# Patient Record
Sex: Female | Born: 1935 | Race: White | Hispanic: No | Smoking: Never smoker
Health system: Southern US, Community
[De-identification: ages and names within clinical notes are randomized; demographics above are authoritative.]

## PROBLEM LIST (undated history)

## (undated) DIAGNOSIS — E785 Hyperlipidemia, unspecified: Secondary | ICD-10-CM

## (undated) DIAGNOSIS — I4891 Unspecified atrial fibrillation: Secondary | ICD-10-CM

## (undated) DIAGNOSIS — I509 Heart failure, unspecified: Secondary | ICD-10-CM

## (undated) DIAGNOSIS — I87309 Chronic venous hypertension (idiopathic) without complications of unspecified lower extremity: Secondary | ICD-10-CM

## (undated) DIAGNOSIS — I313 Pericardial effusion (noninflammatory): Secondary | ICD-10-CM

## (undated) DIAGNOSIS — J9600 Acute respiratory failure, unspecified whether with hypoxia or hypercapnia: Secondary | ICD-10-CM

## (undated) DIAGNOSIS — I10 Essential (primary) hypertension: Secondary | ICD-10-CM

## (undated) DIAGNOSIS — E559 Vitamin D deficiency, unspecified: Secondary | ICD-10-CM

## (undated) DIAGNOSIS — K220 Achalasia of cardia: Secondary | ICD-10-CM

## (undated) DIAGNOSIS — M1990 Unspecified osteoarthritis, unspecified site: Secondary | ICD-10-CM

## (undated) DIAGNOSIS — K621 Rectal polyp: Secondary | ICD-10-CM

## (undated) DIAGNOSIS — E039 Hypothyroidism, unspecified: Secondary | ICD-10-CM

## (undated) DIAGNOSIS — I440 Atrioventricular block, first degree: Secondary | ICD-10-CM

## (undated) DIAGNOSIS — R195 Other fecal abnormalities: Secondary | ICD-10-CM

## (undated) DIAGNOSIS — K317 Polyp of stomach and duodenum: Secondary | ICD-10-CM

## (undated) DIAGNOSIS — K449 Diaphragmatic hernia without obstruction or gangrene: Secondary | ICD-10-CM

## (undated) DIAGNOSIS — I3139 Other pericardial effusion (noninflammatory): Secondary | ICD-10-CM

## (undated) DIAGNOSIS — K219 Gastro-esophageal reflux disease without esophagitis: Secondary | ICD-10-CM

## (undated) HISTORY — DX: Gastro-esophageal reflux disease without esophagitis: K21.9

## (undated) HISTORY — DX: Other fecal abnormalities: R19.5

## (undated) HISTORY — PX: DILATION AND CURETTAGE OF UTERUS: SHX78

## (undated) HISTORY — DX: Atrioventricular block, first degree: I44.0

## (undated) HISTORY — DX: Polyp of stomach and duodenum: K31.7

## (undated) HISTORY — DX: Hyperlipidemia, unspecified: E78.5

## (undated) HISTORY — DX: Rectal polyp: K62.1

## (undated) HISTORY — DX: Diaphragmatic hernia without obstruction or gangrene: K44.9

## (undated) HISTORY — PX: KIDNEY STONE SURGERY: SHX686

## (undated) HISTORY — DX: Unspecified atrial fibrillation: I48.91

## (undated) HISTORY — DX: Hypothyroidism, unspecified: E03.9

## (undated) HISTORY — DX: Unspecified osteoarthritis, unspecified site: M19.90

---

## 1998-04-14 ENCOUNTER — Other Ambulatory Visit: Admission: RE | Admit: 1998-04-14 | Discharge: 1998-04-14 | Payer: Self-pay | Admitting: Family Medicine

## 1998-05-20 ENCOUNTER — Encounter: Payer: Self-pay | Admitting: Obstetrics and Gynecology

## 1998-05-24 ENCOUNTER — Ambulatory Visit (HOSPITAL_COMMUNITY): Admission: RE | Admit: 1998-05-24 | Discharge: 1998-05-24 | Payer: Self-pay | Admitting: Obstetrics and Gynecology

## 2001-05-02 ENCOUNTER — Other Ambulatory Visit: Admission: RE | Admit: 2001-05-02 | Discharge: 2001-05-02 | Payer: Self-pay | Admitting: Family Medicine

## 2002-02-19 ENCOUNTER — Encounter (INDEPENDENT_AMBULATORY_CARE_PROVIDER_SITE_OTHER): Payer: Self-pay | Admitting: *Deleted

## 2002-02-19 ENCOUNTER — Ambulatory Visit (HOSPITAL_COMMUNITY): Admission: RE | Admit: 2002-02-19 | Discharge: 2002-02-19 | Payer: Self-pay | Admitting: Gastroenterology

## 2003-05-10 ENCOUNTER — Other Ambulatory Visit: Admission: RE | Admit: 2003-05-10 | Discharge: 2003-05-10 | Payer: Self-pay | Admitting: Family Medicine

## 2006-03-18 ENCOUNTER — Other Ambulatory Visit: Admission: RE | Admit: 2006-03-18 | Discharge: 2006-03-18 | Payer: Self-pay | Admitting: Family Medicine

## 2007-01-15 ENCOUNTER — Ambulatory Visit: Payer: Self-pay | Admitting: Cardiology

## 2007-01-22 ENCOUNTER — Ambulatory Visit: Payer: Self-pay

## 2007-05-21 ENCOUNTER — Ambulatory Visit: Payer: Self-pay | Admitting: Cardiology

## 2008-02-04 ENCOUNTER — Ambulatory Visit: Payer: Self-pay | Admitting: Cardiology

## 2008-03-02 LAB — HM DEXA SCAN

## 2008-03-03 ENCOUNTER — Ambulatory Visit: Payer: Self-pay

## 2008-03-03 ENCOUNTER — Encounter: Payer: Self-pay | Admitting: Cardiology

## 2008-04-30 LAB — HM PAP SMEAR

## 2009-08-23 LAB — HM MAMMOGRAPHY

## 2010-01-23 LAB — FECAL OCCULT BLOOD, GUAIAC: Fecal Occult Blood: NEGATIVE

## 2010-07-04 ENCOUNTER — Encounter: Payer: Self-pay | Admitting: Family Medicine

## 2010-07-04 DIAGNOSIS — K219 Gastro-esophageal reflux disease without esophagitis: Secondary | ICD-10-CM | POA: Insufficient documentation

## 2010-07-04 DIAGNOSIS — K317 Polyp of stomach and duodenum: Secondary | ICD-10-CM

## 2010-07-04 DIAGNOSIS — E785 Hyperlipidemia, unspecified: Secondary | ICD-10-CM

## 2010-07-04 DIAGNOSIS — K449 Diaphragmatic hernia without obstruction or gangrene: Secondary | ICD-10-CM

## 2010-07-04 DIAGNOSIS — I4891 Unspecified atrial fibrillation: Secondary | ICD-10-CM

## 2010-07-04 DIAGNOSIS — K621 Rectal polyp: Secondary | ICD-10-CM

## 2010-07-04 DIAGNOSIS — E039 Hypothyroidism, unspecified: Secondary | ICD-10-CM

## 2010-07-04 DIAGNOSIS — I440 Atrioventricular block, first degree: Secondary | ICD-10-CM

## 2010-07-04 DIAGNOSIS — M1990 Unspecified osteoarthritis, unspecified site: Secondary | ICD-10-CM

## 2010-07-04 DIAGNOSIS — N951 Menopausal and female climacteric states: Secondary | ICD-10-CM

## 2010-07-04 DIAGNOSIS — N390 Urinary tract infection, site not specified: Secondary | ICD-10-CM

## 2010-08-15 NOTE — Assessment & Plan Note (Signed)
[facility]                            CARDIOLOGY OFFICE NOTE   NAME:[name] [name] ORAVEC                       MRN:          [id number]  DATE:01/15/2007                            DOB:          07-Apr-1932    PRIMARY:  Dr. Vernon Prey.   REASON FOR PRESENTATION:  Evaluate the patient with new-onset atrial  fibrillation.   HISTORY OF PRESENT ILLNESS:  The patient was found recently to be in  atrial fibrillation, which is a new diagnosis.  We have previously seen  her for evaluation of an abnormal chest x-ray.  She had an  echocardiogram in 1997 with no significant abnormalities.   She was noted recently to be in atrial fibrillation, but seemed to have  reasonable rates.  She notices palpitations rarely.  None of this is  limiting.  She has a good exercise tolerance.  It is not changed.  She  does not notice any pre-syncope or syncope.  She has no shortness of  breath, chest discomfort, neck or arm discomfort.  She has had her  thyroid checked recently, and these are normal.  Other electrolytes were  normal as well.  She has otherwise been feeling well.   PAST MEDICAL HISTORY:  Hypothyroidism x15 years.  Hypertension x15  years.  Nephrolithiasis.   PAST SURGICAL HISTORY:  D&C x3.   ALLERGIES:  None.   MEDICATIONS:  1. Klor-Con 10 mEq daily.  2. Levoxyl 137 mcg daily.  3. Lisinopril 10 mg daily.  4. Hydrochlorothiazide 25 mg daily.   SOCIAL HISTORY:  The patient is a Mudlogger at [employer].  She is  single.  She has 2 children.  She does not smoke cigarettes and never  did.  She does not drink alcohol.   FAMILY HISTORY:  Contributory for early coronary artery disease in a  sister with coronary artery disease at 20.  Negative for other systems.  Her mother died of a cerebral hemorrhage at age 91.   REVIEW OF SYSTEMS:  Positive for glasses, reflux, cramps, varicose  veins.  Negative for other systems.   PHYSICAL EXAMINATION:  The patient is  in no distress.  Blood pressure 112/68, heart rate 86 and irregular, weight 208 pounds,  body mass index 34.  HEENT:  Eyelids unremarkable.  Pupils are equal, round, and reactive to  light and accommodation.  Fundi are not visualized.  Oral mucosa  unremarkable.  NECK:  No jugular venous distension at 45 degrees, carotid upstroke  brisk and symmetric, no bruits, thyromegaly.  LYMPHATICS:  No cervical, axillary, or inguinal adenopathy.  LUNGS:  Clear to auscultation bilaterally.  BACK:  No costovertebral angle tenderness.  CHEST:  Unremarkable.  HEART:  PMI not displaced or sustained, S1 and S2 within normal limits,  no S3, no S4, no clicks, rubs, murmurs.  ABDOMEN:  Flat, positive bowel sounds, normal in frequency and pitch, no  bruits, rebound, guarding.  No midline pulsatile masses, hepatomegaly,  splenomegaly.  SKIN:  No rashes, no nodules.  EXTREMITIES:  With 2+ pulses throughout, no edema, cyanosis, clubbing.  NEURO:  Oriented to person, place,  and time, cranial nerves 2-12 grossly  intact, motor grossly intact.   EKG:  Atrial fibrillation, left axis deviation, left anterior fascicular  block, poor anterior R wave progression.   ASSESSMENT AND PLAN:  1. Atrial fibrillation.  The patient is having atrial fibrillation,      but does not particularly notice this.  At this point, I am      inclined to lean towards rate control and anticoagulation.  She has      no contraindication as far as I can tell for the anticoagulation.      She has had recent blood work that indicates very mild anemia.  She      was being treated with a low dose of iron.  I will also put a 24      hour Holter monitor on to see that she has adequate rate control.      Finally, she will get an echocardiogram per guideline      recommendations.  2. Hypertension.  Blood pressure is well controlled.  She will      continue hydrochlorothiazide and lisinopril as listed.  3. Hypothyroidism.  This was checked and  she will continue with      thyroid replacement therapy.  4. Followup.  I would like to see her back in about a month to review      the above.  We will give her literature on Coumadin and on atrial      fibrillation.     Rollene Rotunda, MD, [facility]  Electronically Signed    JH/MedQ  DD: 01/15/2007  DT: 01/16/2007  Job #: [id number]   cc:   Ernestina Penna, M.D.

## 2010-08-15 NOTE — Assessment & Plan Note (Signed)
[facility]                            CARDIOLOGY OFFICE NOTE   NAME:[name], [name]                       MRN:          [id number]  DATE:02/04/2008                            DOB:          April 28, 1932    PRIMARY CARE PHYSICIAN:  Ernestina Penna, MD   REASON FOR PRESENTATION:  Evaluate the patient with mitral regurgitation  and atrial fibrillation.   HISTORY OF PRESENT ILLNESS:  The patient is now 77 years old.  She  returns for 85-month followup.  Since I last saw her, she has had no new  problems.  She took herself off Coumadin because of some tingling in her  feet.  We have had long conversations about restarting this, but she  does not want to.  She rarely feels any palpitations.  She has not had  any presyncope or syncope.  She still works at [employer], making  biscuits.  Of note, she does have moderate mitral regurgitation.  This  was last followed with an echocardiogram in October 2008.  She has had  no new shortness of breath.  She denies any PND or orthopnea.  She had  no chest discomfort, neck or arm discomfort.   PAST MEDICAL HISTORY:  Atrial fibrillation; mitral regurgitation,  moderate with 2 separate jets, and left atrial dilatation;  hypothyroidism x15 years; hypertension x15 years; D&C x3.   ALLERGIES:  None.   MEDICATIONS:  1. Klor-Con 10 mg daily.  2. Levoxyl 137 mcg daily.  3. Lisinopril 10 mg daily.  4. Hydrochlorothiazide 25 mg daily.  5. Aspirin 81 mg daily.  6. Vitamin D.   REVIEW OF SYSTEMS:  As stated in the HPI and otherwise negative for all  other systems.   PHYSICAL EXAMINATION:  GENERAL:  The patient is pleasant and in no  distress.  VITAL SIGNS:  Blood pressure 110/66, heart rate 81 and irregular, weight  205 pounds, and body mass index 35.  HEENT:  Eyelids unremarkable; pupils equal, round, and reactive to  light; fundi not visualized; oral mucosa unremarkable.  NECK:  No jugular distention at 45 degrees;  carotid upstroke brisk and  symmetric; no bruits, no thyromegaly.  LYMPHATICS:  No cervical, axillary, or inguinal adenopathy.  LUNGS:  Clear to auscultation bilaterally.  BACK:  No costovertebral angle tenderness.  CHEST:  Unremarkable.  HEART:  PMI not displaced or sustained; S1 and S2 within normal limits;  no S3, no murmurs.  ABDOMEN:  Flat; positive bowel sounds, normal in frequency and pitch; no  bruits, no rebound, no guarding; no midline pulsatile mass; no  hepatomegaly, no splenomegaly.  SKIN:  No rashes, no nodules.  EXTREMITIES:  A 2+ pulses throughout, mild bilateral ankle edema, mild  varicose veins; no cyanosis, no clubbing.  NEURO:  Oriented to person, place, and time; cranial nerves II through  XII grossly intact; motor grossly intact.   EKG atrial fibrillation, poor anterior R-wave progression suggestive of  possible anteroseptal infarct, left axis deviation, left anterior-  fascicular block, no acute ST-T wave changes, no change from previous  EKGs.   ASSESSMENT AND PLAN:  1. Atrial fibrillation.  I think, the patient should be on Coumadin.      We have discussed the risk of stroke and this is indicated.      However, she really states that she does not want to take it more      because of the cost of the copays when she gets it checked and the      inconvenience.  I have told her to please be reasonable to weigh      this against the risk of stroke, but she does not want to restart      Coumadin at this point.  2. Mitral regurgitation.  We will order an echocardiogram to follow up      on this.  3. Hypertension.  Blood pressure is well controlled, and she will      continue medications as listed.  4. Hypothyroidism, per Dr. Christell Constant.  5. Followup.  I will see the patient again in 6 months or sooner if      needed.     Rollene Rotunda, MD, [facility]  Electronically Signed    JH/MedQ  DD: 02/04/2008  DT: 02/05/2008  Job #: [id number]   cc:   Ernestina Penna, M.D.

## 2010-08-15 NOTE — Assessment & Plan Note (Signed)
[facility]                            CARDIOLOGY OFFICE NOTE   NAME:[name], [name]                       MRN:          [id number]  DATE:05/21/2007                            DOB:          [birth year]    PRIMARY CARE PHYSICIAN:  Ernestina Penna, M.D.   REASON FOR PRESENTATION:  Evaluate patient with mitral regurgitation  atrial fib.   HISTORY OF PRESENT ILLNESS:  the patient is 77 years old.  She returns  for followup of the above.  Since I last saw her.  She stopped her  Coumadin on her own because of tingling from her feet to the top of her  head.  She took the drug for about 4-5 days.  She notices palpitations  occasionally.  These are not particularly problematic.  She has had only  two episodes of some mild lightheadedness, but no pre-syncope or  syncope.  She is able to do her activities of daily living and still  works at [employer]. She denies any chest discomfort, neck or arm  discomfort.  She had no shortness of breath.  Denies any PND or  orthopnea.   Of note, she did have an echocardiogram following the last visit.  Her  ejection fraction was 60%.  However, there was moderate mitral  regurgitation noted.  The left atrium was dilated.  The patient wore a  Holter monitor which demonstrated persistent atrial fibrillation.  Her  rates were reasonably controlled.  There were no sustained tachy rates  of pauses.   PAST MEDICAL HISTORY:  1. Atrial fibrillation.  2. Mitral regurgitation recently noted.  3. Hypothyroidism x15 years.  4. Hypertension x15 years.  5. D&C x3.   ALLERGIES:  None.   MEDICATIONS:  1. Potassium 10 mEq daily.  2. Levoxyl 137 mcg daily.  3. Lisinopril 10 mg daily.  4. Hydrochlorothiazide 25 mg daily.  5. Aspirin 81 mg daily.   REVIEW OF SYSTEMS:  As stated in the HPI and otherwise negative for  other systems.   PHYSICAL EXAMINATION:  The patient is in no distress.  Blood pressure  124/86, heart rate 64  regular.  HEENT: Eyelids unremarkable. Pupils equal, round, and reactive to light.  Fundi not visualized. Oral mucosa is unremarkable.  NECK: No jugular venous distention at 45 degrees. Carotid upstroke brisk  and symmetrical. No bruits, no thyromegaly.  LYMPHATICS: No cervical, axillary or inguinal adenopathy.  LUNGS: Clear to auscultation bilaterally.  BACK: No costovertebral angle tenderness.  CHEST: Unremarkable.  HEART: PMI not displaced or sustained. S1, S2 within normal limits. No  S3. No murmurs.  ABDOMEN: Flat, positive bowel sounds, normal in frequency and pitch. No  bruits. No rebounds. No guarding. No midline pulsatile mass. No  organomegaly.  SKIN: No rashes, no nodules.  EXTREMITIES: 2+ pulses throughout. Mild nonpitting edema, varicose veins  in the lower extremities.  NEURO: Oriented to person, place and time. Cranial nerves II-XII grossly  intact. Motor grossly intact.   EKG:  Atrial fibrillation, left axis deviation, left anterior fascicular  block, poor anterior R wave progression, cannot exclude anteroseptal  infarct.   ASSESSMENT/PLAN:  1. Atrial fibrillation.  We had a long discussion about the Coumadin      and risks of stroke.  I think that her age, sex, mitral      regurgitation, left atrial dilatation put her into a risk category      of 3-4% per year which would favor the use of Coumadin.  I doubt      that the side effects she was describing are related to the      Coumadin.  After this discussion she will resume the Coumadin.  I      have told her she needs to call the [facility] in [place] today to let them know that she is resuming and to      schedule followup.  She will resume at her previous prescribed      dose.  2. Mitral regurgitation. Will follow this in October with a repeat      echocardiogram.  She does not have any symptoms related to this.  3. Hypertension, blood pressure is well-controlled.  She will continue       medications as listed.  4. Hypothyroidism per Dr. Christell Constant.  5. Followup.  I will see her back in October after the echo.     Rollene Rotunda, MD, [facility]  Electronically Signed    JH/MedQ  DD: 05/21/2007  DT: 05/21/2007  Job #: [id number]   cc:   Ernestina Penna, M.D.

## 2010-08-18 NOTE — Op Note (Signed)
   NAME:  [name], [name]                          ACCOUNT NO.:  [id number]   MEDICAL RECORD NO.:  [id number]                   PATIENT TYPE:  AMB   LOCATION:  ENDO                                 FACILITY:  [facility]   PHYSICIAN:  John C. Madilyn Fireman, M.D.                 DATE OF BIRTH:  November 18, 1932   DATE OF PROCEDURE:  02/19/2002  DATE OF DISCHARGE:                                 OPERATIVE REPORT   PROCEDURE:  Esophagogastroduodenoscopy with biopsy.   INDICATION FOR PROCEDURE:  Anemia and gastroesophageal reflux.  Today's  procedure is combined with a colonoscopy as well.   DESCRIPTION OF PROCEDURE:  The patient was placed in the left lateral  decubitus position and placed on the pulse monitor with continuous low-flow  oxygen delivered by nasal cannula.  She was sedated with 60 mg IV Demerol  and 6 mg IV Versed.  The Olympus video endoscope was advanced under direct  vision into the oropharynx and esophagus.  The esophagus was straight and of  normal caliber with the squamocolumnar line at 37 cm above a 2.5 cm hiatal  hernia.  There was no suggestion of Barrett's esophagus and no visible  erosions and no ring or stricture.  The stomach was entered and a small  amount of liquid secretions was suctioned from the fundus.  Retroflexed view  of the cardia was unremarkable.  On the greater curvature and the proximal  body there was an 8 mm polyp, which was fulgurated by hot biopsy.  The  remainder of the body and antrum appeared normal.  The pylorus was  nondeformed and easily allowed passage of the endoscope deep into the  duodenum.  Both the bulb and the second portion were well-inspected and  appeared to be within normal limits.  Small bowel biopsies were obtained due  to the anemia.  The scope was then withdrawn, and the patient returned to  the recovery room in stable condition.  She tolerated the procedure well,  and there were no immediate complications.   IMPRESSION:  1. Small gastric  polyp.  2. A 2.5 cm hiatal hernia.   PLAN:  Await biopsy results and will proceed with colonoscopy.                                               John C. Madilyn Fireman, M.D.    JCH/MEDQ  D:  02/19/2002  T:  02/19/2002  Job:  [id number]   cc:   Ernestina Penna, M.D.  [address] [place]  [place] [address]  Fax: [phone]

## 2010-08-18 NOTE — Op Note (Signed)
   NAME:  FELICIANA, NARAYAN                          ACCOUNT NO.:  [id number]   MEDICAL RECORD NO.:  [id number]                   PATIENT TYPE:  AMB   LOCATION:  ENDO                                 [facility]ACILITY:  [facility]   PHYSICIAN:  John C. Madilyn Fireman, M.D.                 DATE OF BIRTH:  Dec 25, 1932   DATE OF PROCEDURE:  02/19/2002  DATE OF DISCHARGE:                                 OPERATIVE REPORT   PROCEDURE:  Colonoscopy with polypectomy.   INDICATIONS FOR PROCEDURE:  Anemia in a 77 year old patient with no recent  colon screening.   DESCRIPTION OF PROCEDURE:  The patient was placed in the left lateral  decubitus position then placed on the pulse monitor with continuous low flow  oxygen delivered by nasal cannula. She was sedated with 60 mg IV Demerol and  6 mg IV Versed. The Olympus video colonoscope was inserted into the rectum  and advanced to the cecum, confirmed by transillumination at McBurney's  point and visualization of the ileocecal valve and appendiceal orifice. The  prep was excellent. The cecum, ascending, transverse, descending and sigmoid  colon all appeared normal with no masses, polyps, diverticula or other  mucosal abnormalities. Within the rectum, there was an 8 mm sessile polyp  which was fulgurated by hot biopsy. The remainder of the rectum appeared  normal. The scope was then withdrawn and the patient returned to the  recovery room in stable condition. The patient tolerated the procedure well  and there were no immediate complications.   IMPRESSION:  Small rectal polyp, otherwise, normal colonoscopy.   PLAN:  Await biopsy results.                                               John C. Madilyn Fireman, M.D.    JCH/MEDQ  D:  02/19/2002  T:  02/19/2002  Job:  [id number]   cc:   Ernestina Penna, M.D.  [address] [place]  [place] [address]  Fax: [phone]

## 2012-02-20 ENCOUNTER — Encounter: Payer: Self-pay | Admitting: Cardiology

## 2012-05-17 ENCOUNTER — Emergency Department (HOSPITAL_COMMUNITY)
Admission: EM | Admit: 2012-05-17 | Discharge: 2012-05-17 | Disposition: A | Discharge status: Home / Self Care | Payer: Medicare Other | Attending: Emergency Medicine | Admitting: Emergency Medicine

## 2012-05-17 ENCOUNTER — Emergency Department (HOSPITAL_COMMUNITY): Payer: Medicare Other

## 2012-05-17 ENCOUNTER — Encounter (HOSPITAL_COMMUNITY): Payer: Self-pay | Admitting: Emergency Medicine

## 2012-05-17 DIAGNOSIS — Z79899 Other long term (current) drug therapy: Secondary | ICD-10-CM | POA: Insufficient documentation

## 2012-05-17 DIAGNOSIS — Z8639 Personal history of other endocrine, nutritional and metabolic disease: Secondary | ICD-10-CM | POA: Insufficient documentation

## 2012-05-17 DIAGNOSIS — Z8739 Personal history of other diseases of the musculoskeletal system and connective tissue: Secondary | ICD-10-CM | POA: Insufficient documentation

## 2012-05-17 DIAGNOSIS — Z8742 Personal history of other diseases of the female genital tract: Secondary | ICD-10-CM | POA: Insufficient documentation

## 2012-05-17 DIAGNOSIS — Z8719 Personal history of other diseases of the digestive system: Secondary | ICD-10-CM | POA: Insufficient documentation

## 2012-05-17 DIAGNOSIS — E039 Hypothyroidism, unspecified: Secondary | ICD-10-CM | POA: Insufficient documentation

## 2012-05-17 DIAGNOSIS — J40 Bronchitis, not specified as acute or chronic: Secondary | ICD-10-CM

## 2012-05-17 DIAGNOSIS — J209 Acute bronchitis, unspecified: Secondary | ICD-10-CM | POA: Insufficient documentation

## 2012-05-17 DIAGNOSIS — Z8679 Personal history of other diseases of the circulatory system: Secondary | ICD-10-CM | POA: Insufficient documentation

## 2012-05-17 DIAGNOSIS — Z8781 Personal history of (healed) traumatic fracture: Secondary | ICD-10-CM | POA: Insufficient documentation

## 2012-05-17 DIAGNOSIS — Z87440 Personal history of urinary (tract) infections: Secondary | ICD-10-CM | POA: Insufficient documentation

## 2012-05-17 DIAGNOSIS — I10 Essential (primary) hypertension: Secondary | ICD-10-CM | POA: Insufficient documentation

## 2012-05-17 DIAGNOSIS — Z862 Personal history of diseases of the blood and blood-forming organs and certain disorders involving the immune mechanism: Secondary | ICD-10-CM | POA: Insufficient documentation

## 2012-05-17 HISTORY — DX: Essential (primary) hypertension: I10

## 2012-05-17 MED ORDER — BENZONATATE 100 MG PO CAPS: 100.0000 mg | ORAL_CAPSULE | Freq: Three times a day (TID) | ORAL | Status: DC

## 2012-05-17 MED ORDER — ALBUTEROL SULFATE HFA 108 (90 BASE) MCG/ACT IN AERS
2.0000 | INHALATION_SPRAY | Freq: Once | RESPIRATORY_TRACT | Status: AC
  Administered 2012-05-17: 2 via RESPIRATORY_TRACT
  Filled 2012-05-17: qty 6.7

## 2012-05-17 MED ORDER — AZITHROMYCIN 250 MG PO TABS: 250.0000 mg | ORAL_TABLET | Freq: Every day | ORAL | Status: DC

## 2012-05-17 NOTE — ED Provider Notes (Signed)
History     CSN: [id number]  Arrival date & time 05/17/12  1400   First MD Initiated Contact with Patient 05/17/12 1648      Chief Complaint  Patient presents with  . Cough    (Consider location/radiation/quality/duration/timing/severity/associated sxs/prior treatment) HPI Comments: 79 year old female with a recent history of a collar bone fracture as well as rib fractures one month ago who presents with a complaint of increased cough. The cough started 4 days ago, he gradual in onset, it has been persistent and now it is gradually worsening and has been associated with a yellow sputum production. She denies fevers or chills, nausea or vomiting, chest pain abdominal pain back pain or swelling in the legs. She has not tried any medications for this. She tells me that she was recently told she thought she may have emphysema by her family doctor but has no history of tobacco use. Ever since her fall with collarbone fracture she has been living with her child who is a smoker.  Patient is a 79 y.o. female presenting with cough. The history is provided by the patient and a relative.  Cough   Past Medical History  Diagnosis Date  . Unspecified hypothyroidism   . Urinary tract infection, site not specified   . Diaphragmatic hernia without mention of obstruction or gangrene   . Esophagitis   . Other and unspecified hyperlipidemia   . Prolapse of vaginal walls without mention of uterine prolapse   . Arthritis   . First degree atrioventricular block   . Symptomatic menopausal or female climacteric states   . AF (atrial fibrillation)   . Rectal polyp   . Gastric polyp   . Hypertension     Past Surgical History  Procedure Laterality Date  . Kidney stone surgery    . Dilation and curettage of uterus      No family history on file.  History  Substance Use Topics  . Smoking status: Never Smoker   . Smokeless tobacco: Not on file  . Alcohol Use: No    OB History   Grav Para Term  Preterm Abortions TAB SAB Ect Mult Living                  Review of Systems  Respiratory: Positive for cough.   All other systems reviewed and are negative.    Allergies  Aspirin; Codeine; Sulfa antibiotics; Vicodin; and Warfarin sodium  Home Medications   Current Outpatient Rx  Name  Route  Sig  Dispense  Refill  . chlorpheniramine-HYDROcodone (TUSSIONEX) 10-8 MG/5ML LQCR   Oral   Take 5 mLs by mouth every 12 (twelve) hours as needed (cough).         . Cholecalciferol (VITAMIN D3) 2000 UNITS TABS   Oral   Take 1 tablet by mouth daily.           . Fe Fum-FePoly-Vit C-Vit B3 (INTEGRA) 62.5-62.5-40-3 MG CAPS   Oral   Take 1 capsule by mouth daily.         . furosemide (LASIX) 40 MG tablet   Oral   Take 40 mg by mouth daily.         Marland Kitchen HYDROcodone-acetaminophen (NORCO/VICODIN) 5-325 MG per tablet   Oral   Take 1 tablet by mouth every 4 (four) hours as needed for pain.         Marland Kitchen levothyroxine (SYNTHROID, LEVOTHROID) 137 MCG tablet   Oral   Take 137 mcg by mouth daily. Mon,Wed,Fri.         Marland Kitchen  lisinopril (PRINIVIL,ZESTRIL) 10 MG tablet   Oral   Take 10 mg by mouth daily.           . Multiple Vitamins-Minerals (CENTRUM SILVER) tablet   Oral   Take 1 tablet by mouth daily.           . potassium chloride (K-DUR,KLOR-CON) 10 MEQ tablet   Oral   Take 10 mEq by mouth daily.           Marland Kitchen azithromycin (ZITHROMAX Z-PAK) 250 MG tablet   Oral   Take 1 tablet (250 mg total) by mouth daily. 500mg  PO day 1, then 250mg  PO days 205   6 tablet   0   . benzonatate (TESSALON) 100 MG capsule   Oral   Take 1 capsule (100 mg total) by mouth every 8 (eight) hours.   21 capsule   0     BP 136/70  Pulse 104  Temp(Src) 98.3 F (36.8 C) (Oral)  Resp 16  SpO2 96%  Physical Exam  Nursing note and vitals reviewed. Constitutional: She appears well-developed and well-nourished. No distress.  HENT:  Head: Normocephalic and atraumatic.  Mouth/Throat: Oropharynx  is clear and moist. No oropharyngeal exudate.  No signs of trauma to the head, normocephalic, atraumatic, oropharynx is clear with moist mucous membranes and a clear pharynx without asymmetry, exudate, hypertrophy or erythema. Nasal passages are clear with no rhinorrhea or turbinate swelling. Tympanic membranes are clear bilaterally  Eyes: Conjunctivae and EOM are normal. Pupils are equal, round, and reactive to light. Right eye exhibits no discharge. Left eye exhibits no discharge. No scleral icterus.  Neck: Normal range of motion. Neck supple. No JVD present. No thyromegaly present.  Cardiovascular: Normal rate, regular rhythm, normal heart sounds and intact distal pulses.  Exam reveals no gallop and no friction rub.   No murmur heard. Pulmonary/Chest: Effort normal. No respiratory distress. She has wheezes ( diffuse mild expiratory wheezing, no respiratory distress, no rales, speaks in full sentences). She has no rales.  Abdominal: Soft. Bowel sounds are normal. She exhibits no distension and no mass. There is no tenderness.  Musculoskeletal: Normal range of motion. She exhibits no edema and no tenderness.  Lymphadenopathy:    She has no cervical adenopathy.  Neurological: She is alert. Coordination normal.  Skin: Skin is warm and dry. No rash noted. No erythema.  Psychiatric: She has a normal mood and affect. Her behavior is normal.    ED Course  Procedures (including critical care time)  Labs Reviewed - No data to display Dg Chest 2 View  05/17/2012  *RADIOLOGY REPORT*  Clinical Data: Cough, congestion, shortness of breath.  CHEST - 2 VIEW  Comparison: None.  Findings: Cardiomegaly. There is hyperinflation of the lungs compatible with COPD.  Scarring in the apices.  No acute opacities or effusions.  No acute bony abnormality.  IMPRESSION: COPD/chronic changes.  Cardiomegaly.  No acute findings.   Original Report Authenticated By: Charlett Nose, M.D.      1. Bronchitis       MDM   Overall the patient appears very stable, she is 96% oxygen on room air, she is afebrile and according to her chest x-ray has no infections.  PA and lateral views of the chest were obtained by digital radiography. I have personally interpreted these x-rays and find her to be no signs of pulmonary infiltrate, cardiomegaly, subdiaphragmatic free air, soft tissue abnormality, no obvious bony abnormalities or fractures.  The patient will be given albuterol for  her bronchitis wheezing, Zithromax, Tessalon, followup with her doctor on Monday, she is in agreement with this plan. She appears nontoxic and is amenable to discharge.        Vida Roller, MD 05/17/12 [phone]

## 2012-05-17 NOTE — ED Notes (Signed)
Paged RT for inhaler

## 2012-05-17 NOTE — ED Notes (Signed)
Pt c/o cough productive cough with yellow sputum since Tuesday. Pt has broken ribs and broken collar bone on left side from fall 04/18/12.

## 2012-06-16 ENCOUNTER — Ambulatory Visit: Payer: Medicare Other | Attending: Orthopedic Surgery | Admitting: Physical Therapy

## 2012-06-16 DIAGNOSIS — M25519 Pain in unspecified shoulder: Secondary | ICD-10-CM | POA: Insufficient documentation

## 2012-06-16 DIAGNOSIS — M25619 Stiffness of unspecified shoulder, not elsewhere classified: Secondary | ICD-10-CM | POA: Insufficient documentation

## 2012-06-16 DIAGNOSIS — R5381 Other malaise: Secondary | ICD-10-CM | POA: Insufficient documentation

## 2012-06-17 ENCOUNTER — Ambulatory Visit: Payer: Medicare Other | Admitting: Physical Therapy

## 2012-06-23 ENCOUNTER — Ambulatory Visit: Payer: Medicare Other | Admitting: Physical Therapy

## 2012-06-25 ENCOUNTER — Ambulatory Visit: Payer: Medicare Other | Admitting: Physical Therapy

## 2012-06-30 ENCOUNTER — Ambulatory Visit: Payer: Medicare Other | Admitting: Physical Therapy

## 2012-07-02 ENCOUNTER — Ambulatory Visit: Payer: Medicare Other | Attending: Orthopedic Surgery | Admitting: Physical Therapy

## 2012-07-02 ENCOUNTER — Ambulatory Visit: Payer: Medicare Other

## 2012-07-02 DIAGNOSIS — M25619 Stiffness of unspecified shoulder, not elsewhere classified: Secondary | ICD-10-CM | POA: Insufficient documentation

## 2012-07-02 DIAGNOSIS — M25519 Pain in unspecified shoulder: Secondary | ICD-10-CM | POA: Insufficient documentation

## 2012-07-02 DIAGNOSIS — R5381 Other malaise: Secondary | ICD-10-CM | POA: Insufficient documentation

## 2012-07-02 DIAGNOSIS — IMO0001 Reserved for inherently not codable concepts without codable children: Secondary | ICD-10-CM | POA: Insufficient documentation

## 2012-07-07 ENCOUNTER — Ambulatory Visit: Payer: Medicare Other | Admitting: Physical Therapy

## 2012-07-07 ENCOUNTER — Ambulatory Visit (INDEPENDENT_AMBULATORY_CARE_PROVIDER_SITE_OTHER): Payer: Medicare Other | Admitting: Family Medicine

## 2012-07-07 VITALS — BP 180/90 | HR 68 | Temp 97.8°F | Ht 64.0 in | Wt 206.0 lb

## 2012-07-07 DIAGNOSIS — I4891 Unspecified atrial fibrillation: Secondary | ICD-10-CM

## 2012-07-07 DIAGNOSIS — R42 Dizziness and giddiness: Secondary | ICD-10-CM

## 2012-07-07 DIAGNOSIS — H8309 Labyrinthitis, unspecified ear: Secondary | ICD-10-CM

## 2012-07-07 DIAGNOSIS — H8303 Labyrinthitis, bilateral: Secondary | ICD-10-CM

## 2012-07-07 NOTE — Progress Notes (Signed)
  Subjective:    Patient ID: [name], female    DOB: Nov 08, 1932, 79 y.o.   MRN: [id number]  HPI Just finished physical therapy following her fracture of the clavicle and she felt lightheaded and dizzy after this therapy. She has several episodes of this in the past week to 10 days .BP 180/90  Pulse 68  Temp(Src) 97.8 F (36.6 C) (Oral)  Ht 5\' 4"  (1.626 m)  Wt 206 lb (93.441 kg)  BMI 35.34 kg/m2    Review of Systems  Constitutional: Positive for fatigue.  HENT: Positive for neck stiffness (sore from recent fall).   Eyes: Negative.   Respiratory: Positive for shortness of breath (sometimes with exertion).   Cardiovascular: Positive for palpitations.  Gastrointestinal: Negative.   Endocrine: Negative.   Genitourinary: Negative.   Allergic/Immunologic: Negative.   Neurological: Positive for dizziness, weakness, light-headedness and headaches.  Hematological: Negative.   Psychiatric/Behavioral: Negative.        Objective:   Physical Exam  BP 180/90  Pulse 68  Temp(Src) 97.8 F (36.6 C) (Oral)  Ht 5\' 4"  (1.626 m)  Wt 206 lb (93.441 kg)  BMI 35.34 kg/m2  The patient appeared well nourished and normally developed, alert and oriented to time and place. Speech, behavior and judgement appear normal. Vital signs as documented.  Head exam is unremarkable. No scleral icterus or pallor noted. HEENT within normal limits. Neck is without jugular venous distension, thyromegally, or carotid bruits. Carotid upstrokes are brisk bilaterally. No cervical adenopathy. Lungs are clear anteriorly and posteriorly to auscultation. Normal respiratory effort. Cardiac exam reveals regular rate and rhythm. First and second heart sounds normal. No murmurs, rubs or gallops. EKG done prior to the visit showed atrial fibrillation. Abdominal exam reveals , no masses, no organomegaly and no aortic enlargement. No inguinal adenopathy. Extremities are nonedematous . Skin without pallor or jaundice.   Warm and dry, without rash. Neurologic exam reveals normal deep tendon reflexes and normal sensation.  Repeat bp:140/80  EKG:  atrial fibrillation, rate 59/min.  patient has had a history of this and does not take any kind of anticoagulation        Assessment & Plan:   Anxiety Labyrinthitis   meclizine 12.5 one 4 times daily with meals and at bedtime for 7 days then take when necessary Scheduled visit with Dr. Antoine Poche  to reevaluate because of episodic atrial fib dizziness Scheduled visit with Tammy, to see if she is a candidate for one of the newer anticoagulant agents

## 2012-07-07 NOTE — Patient Instructions (Signed)
Take meds as directed from Pharmacy Fall prevention Keep appointment with Tammy And with Dr. Antoine Poche

## 2012-07-09 ENCOUNTER — Encounter: Payer: Self-pay | Admitting: Cardiology

## 2012-07-09 ENCOUNTER — Ambulatory Visit (INDEPENDENT_AMBULATORY_CARE_PROVIDER_SITE_OTHER): Payer: Medicare Other | Admitting: *Deleted

## 2012-07-09 ENCOUNTER — Ambulatory Visit (INDEPENDENT_AMBULATORY_CARE_PROVIDER_SITE_OTHER): Payer: Medicare Other | Admitting: Cardiology

## 2012-07-09 ENCOUNTER — Ambulatory Visit: Payer: Medicare Other | Admitting: *Deleted

## 2012-07-09 VITALS — BP 125/79 | HR 84 | Ht 64.0 in | Wt 203.0 lb

## 2012-07-09 DIAGNOSIS — I059 Rheumatic mitral valve disease, unspecified: Secondary | ICD-10-CM

## 2012-07-09 DIAGNOSIS — I4891 Unspecified atrial fibrillation: Secondary | ICD-10-CM

## 2012-07-09 DIAGNOSIS — I340 Nonrheumatic mitral (valve) insufficiency: Secondary | ICD-10-CM

## 2012-07-09 MED ORDER — APIXABAN 5 MG PO TABS: 5.0000 mg | ORAL_TABLET | Freq: Two times a day (BID) | ORAL | Status: DC

## 2012-07-09 NOTE — Progress Notes (Signed)
HPI The patient presents for evaluation of syncope. This actually happened last week. She bent over to pick something up. When she came up she felt dizzy. She actually apparently lost consciousness and did end up on the floor. She's had some brief episodes of dizziness. At times these have been associated with positions. She thought some of it was related to anxiety and possibly vertigo. She's not had a syncopal episode before. She does describe some mild orthostatic like symptoms. She did not notice any significant tachycardia palpitations and really doesn't notice her chronic atrial fibrillation. She doesn't describe chest pressure, neck or arm discomfort. She doesn't have any significant shortness of breath, PND or orthopnea.  She has been treated recently with meclizine. I saw her several years ago for followup of chronic atrial fibrillation. I reviewed these records as well as her previous echocardiogram which demonstrated some moderate mitral regurgitation but preserved ejection fraction. I did review a Holter monitor from 2008.  Allergies  Allergen Reactions  . Aspirin Nausea Only  . Codeine Nausea Only  . Sulfa Antibiotics   . Vicodin (Hydrocodone-Acetaminophen) Nausea Only  . Warfarin Sodium Other (See Comments)    Tingling all over    Current Outpatient Prescriptions  Medication Sig Dispense Refill  . Cholecalciferol (VITAMIN D3) 2000 UNITS TABS Take 1 tablet by mouth daily.        . Fe Fum-FePoly-Vit C-Vit B3 (INTEGRA) 62.5-62.5-40-3 MG CAPS Take 1 capsule by mouth daily.      . furosemide (LASIX) 40 MG tablet Take 40 mg by mouth daily.      Marland Kitchen KLOR-CON 10 10 MEQ tablet Take 10 mEq by mouth 1 day or 1 dose.      . levothyroxine (SYNTHROID, LEVOTHROID) 137 MCG tablet Take 137 mcg by mouth daily. Mon,Wed,Fri.      . lisinopril (PRINIVIL,ZESTRIL) 10 MG tablet Take 20 mg by mouth daily.       . Multiple Vitamins-Minerals (CENTRUM SILVER) tablet Take 1 tablet by mouth daily.          No current facility-administered medications for this visit.    Past Medical History  Diagnosis Date  . Unspecified hypothyroidism   . Urinary tract infection, site not specified   . Diaphragmatic hernia without mention of obstruction or gangrene   . Esophagitis   . Other and unspecified hyperlipidemia   . Prolapse of vaginal walls without mention of uterine prolapse   . Arthritis   . First degree atrioventricular block   . Symptomatic menopausal or female climacteric states   . AF (atrial fibrillation)   . Rectal polyp   . Gastric polyp   . Hypertension     Past Surgical History  Procedure Laterality Date  . Kidney stone surgery    . Dilation and curettage of uterus      Family History  Problem Relation Age of Onset  . Hypertension Mother   . Stroke Mother   . Hypertension Father   . Kidney disease Father     History   Social History  . Marital Status: Divorced    Spouse Name: N/A    Number of Children: N/A  . Years of Education: N/A   Occupational History  . Not on file.   Social History Main Topics  . Smoking status: Never Smoker   . Smokeless tobacco: Not on file  . Alcohol Use: No  . Drug Use: No  . Sexually Active: Not on file   Other Topics Concern  .  Not on file   Social History Narrative  . No narrative on file    ROS:  Positive for headache, swallowing, arthritis.   PHYSICAL EXAM BP 125/79  Pulse 84  Ht 5\' 4"  (1.626 m)  Wt 203 lb (92.08 kg)  BMI 34.83 kg/m2 GENERAL:  Well appearing HEENT:  Pupils equal round and reactive, fundi not visualized, oral mucosa unremarkable NECK:  No jugular venous distention, waveform within normal limits, carotid upstroke brisk and symmetric, no bruits, no thyromegaly LYMPHATICS:  No cervical, inguinal adenopathy LUNGS:  Clear to auscultation bilaterally BACK:  No CVA tenderness CHEST:  Unremarkable HEART:  PMI not displaced or sustained,S1 and S2 within normal limits, no S3, no clicks, no rubs,  apical holosystolic murmur, 2/6, no diastolic murmurs, irregular ABD:  Flat, positive bowel sounds normal in frequency in pitch, no bruits, no rebound, no guarding, no midline pulsatile mass, no hepatomegaly, no splenomegaly EXT:  2 plus pulses throughout, trace edema, no cyanosis no clubbing SKIN:  No rashes no nodules NEURO:  Cranial nerves II through XII grossly intact, motor grossly intact throughout [facility]:  Cognitively intact, oriented to person place and time  EKG:  Atrial fibrillation, left bundle branch block, left axis deviation, rate 59.  07/07/12  ASSESSMENT AND PLAN   ATRIAL FIBRILLATION:  She has a CHA2DS2 - VASc score of 4 with a risk of stroke of 4%  and a HAS - BLED score of 2 with a low risk of bleeding.  I see no contraindication to anticoagulation. I had a long conversation with her about this. She didn't tolerate Coumadin because of "tingling". However, I will suggest and start Eliquis.  She understands the risks and benefits and she will agree.  I will apply a 24-hour Holter to make sure that she's not had his bradycardia arrhythmias and that she otherwise has a controlled ventricular rate.  SYNCOPE:  This was probably related to change in position of possible orthostasis or maybe even an inner ear problem. However, I will evaluate with a Holter as above.    MITRAL REGURGITATION:  She will need a follow up echocardiogram to evaluate this.   HTN:  The blood pressure is at target. No change in medications is indicated. We will continue with therapeutic lifestyle changes (TLC).

## 2012-07-09 NOTE — Patient Instructions (Addendum)
Please start Eliqius 5 mg one twice a day. Continue all other medications as listed.  Your physician has requested that you have an echocardiogram. Echocardiography is a painless test that uses sound waves to create images of your heart. It provides your doctor with information about the size and shape of your heart and how well your heart's chambers and valves are working. This procedure takes approximately one hour. There are no restrictions for this procedure.  Your physician has recommended that you wear a holter monitor. Holter monitors are medical devices that record the heart's electrical activity. Doctors most often use these monitors to diagnose arrhythmias. Arrhythmias are problems with the speed or rhythm of the heartbeat. The monitor is a small, portable device. You can wear one while you do your normal daily activities. This is usually used to diagnose what is causing palpitations/syncope (passing out).  Follow up with Dr Antoine Poche after testing has been completed.

## 2012-07-09 NOTE — Progress Notes (Signed)
Placed 24 hour holter monitor.  Instructions given and patient will return in 24 hrs for removal.

## 2012-07-09 NOTE — Patient Instructions (Addendum)
Holter Monitoring A Holter monitor is a small device with electrodes (small sticky patches) that attach to your chest. It records the electrical activity of your heart and is worn continuously for 24-48 hours.  A HOLTER MONITOR IS USED TO  Detect heart problems such as:  Heart arrhythmia. Is an abnormal or irregular heartbeat. With some heart arrhythmias, you may not feel or know that you have an irregular heart rhythm.  Palpitations, such as feeling your heart racing or fluttering. It is possible to have heart palpitations and not have a heart arrhythmia.  A heart rhythm that is too slow or too fast.  If you have problems fainting, near fainting or feeling light-headed, a Holter monitor may be worn to see if your heart is the cause. HOLTER MONITOR PREPARATION   Electrodes will be attached to the skin on your chest.  If you have hair on your chest, small areas may have to be shaved. This is done to help the patches stick better and make the recording more accurate.  The electrodes are attached by wires to the Holter monitor. The Holter monitor clips to your clothing. You will wear the monitor at all times, even while exercising and sleeping. HOME CARE INSTRUCTIONS   Wear your monitor at all times.  The wires and the monitor must stay dry. Do not get the monitor wet.  Do not bathe, swim or use a hot tub with it on.  You may do a "sponge" bath while you have the monitor on.  Keep your skin clean, do not put body lotion or moisturizer on your chest.  It's possible that your skin under the electrodes could become irritated. To keep this from happening, you may put the electrodes in slightly different places on your chest.  Your caregiver will also ask you to keep a diary of your activities, such as walking or doing chores. Be sure to note what you are doing if you experience heart symptoms such as palpitations. This will help your caregiver determine what might be contributing to your  symptoms. The information stored in your monitor will be reviewed by your caregiver alongside your diary entries.  Make sure the monitor is safely clipped to your clothing or in a location close to your body that your caregiver recommends.  The monitor and electrodes are removed when the test is over. Return the monitor as directed.  Be sure to follow up with your caregiver and discuss your Holter monitor results. SEEK IMMEDIATE MEDICAL CARE IF:  You faint or feel lightheaded.  You have trouble breathing.  You get pain in your chest, upper arm or jaw.  You feel sick to your stomach and your skin is pale, cool, or damp.  You think something is wrong with the way your heart is beating. MAKE SURE YOU:   Understand these instructions.  Will watch your condition.  Will get help right away if you are not doing well or get worse. Document Released: 12/16/2003 Document Revised: 06/11/2011 Document Reviewed: 04/29/2008 [facility] Patient Information 2013 [place], [place].

## 2012-07-11 ENCOUNTER — Ambulatory Visit (INDEPENDENT_AMBULATORY_CARE_PROVIDER_SITE_OTHER): Payer: Medicare Other

## 2012-07-11 DIAGNOSIS — D649 Anemia, unspecified: Secondary | ICD-10-CM

## 2012-07-11 MED ORDER — CYANOCOBALAMIN 1000 MCG/ML IJ SOLN
1000.0000 ug | Freq: Once | INTRAMUSCULAR | Status: AC
  Administered 2012-07-11: 1000 ug via INTRAMUSCULAR

## 2012-07-11 NOTE — Progress Notes (Signed)
Patient returned monitor.  Download started.

## 2012-07-11 NOTE — Progress Notes (Signed)
Tolerated injection well. 

## 2012-07-14 ENCOUNTER — Ambulatory Visit: Payer: Medicare Other | Admitting: Physical Therapy

## 2012-07-14 NOTE — Progress Notes (Signed)
24 holter monitor results returned. Summery page scanned into epic and results routed to Dr. Antoine Poche. Patient notified that Dr. Jenene Slicker office will notify her with the results.

## 2012-07-16 ENCOUNTER — Ambulatory Visit: Payer: Medicare Other | Admitting: Physical Therapy

## 2012-07-21 ENCOUNTER — Ambulatory Visit: Payer: Medicare Other | Admitting: Physical Therapy

## 2012-07-22 ENCOUNTER — Ambulatory Visit (HOSPITAL_COMMUNITY): Payer: Medicare Other | Attending: Cardiovascular Disease | Admitting: Radiology

## 2012-07-22 DIAGNOSIS — I059 Rheumatic mitral valve disease, unspecified: Secondary | ICD-10-CM

## 2012-07-22 DIAGNOSIS — I440 Atrioventricular block, first degree: Secondary | ICD-10-CM | POA: Insufficient documentation

## 2012-07-22 DIAGNOSIS — I080 Rheumatic disorders of both mitral and aortic valves: Secondary | ICD-10-CM | POA: Insufficient documentation

## 2012-07-22 DIAGNOSIS — A5203 Syphilitic endocarditis: Secondary | ICD-10-CM | POA: Insufficient documentation

## 2012-07-22 DIAGNOSIS — R42 Dizziness and giddiness: Secondary | ICD-10-CM | POA: Insufficient documentation

## 2012-07-22 DIAGNOSIS — I4891 Unspecified atrial fibrillation: Secondary | ICD-10-CM | POA: Insufficient documentation

## 2012-07-22 DIAGNOSIS — I340 Nonrheumatic mitral (valve) insufficiency: Secondary | ICD-10-CM

## 2012-07-22 DIAGNOSIS — E669 Obesity, unspecified: Secondary | ICD-10-CM | POA: Insufficient documentation

## 2012-07-22 DIAGNOSIS — I379 Nonrheumatic pulmonary valve disorder, unspecified: Secondary | ICD-10-CM | POA: Insufficient documentation

## 2012-07-22 DIAGNOSIS — I319 Disease of pericardium, unspecified: Secondary | ICD-10-CM | POA: Insufficient documentation

## 2012-07-22 NOTE — Progress Notes (Signed)
Echocardiogram performed.  

## 2012-07-23 ENCOUNTER — Ambulatory Visit: Payer: Medicare Other | Admitting: Physical Therapy

## 2012-07-28 ENCOUNTER — Ambulatory Visit: Payer: Medicare Other | Admitting: *Deleted

## 2012-07-30 ENCOUNTER — Ambulatory Visit (INDEPENDENT_AMBULATORY_CARE_PROVIDER_SITE_OTHER): Payer: Medicare Other | Admitting: Cardiology

## 2012-07-30 ENCOUNTER — Encounter: Payer: Self-pay | Admitting: Cardiology

## 2012-07-30 ENCOUNTER — Ambulatory Visit: Payer: Medicare Other | Admitting: Physical Therapy

## 2012-07-30 VITALS — BP 153/81 | HR 60 | Ht 64.0 in | Wt 205.0 lb

## 2012-07-30 DIAGNOSIS — I4891 Unspecified atrial fibrillation: Secondary | ICD-10-CM

## 2012-07-30 NOTE — Progress Notes (Signed)
HPI The patient presents for evaluation of syncope. This was probably related to orthostasis. At the last visit she was started on a Eliquis but she didn't take this because it was too extensive. She did not call us to discuss this. She did have an echo which I reviewed with her today which demonstrated a preserved ejection fraction and only mild mitral regurgitation. A Holter monitor demonstrated that her heart rate was well-controlled in persistent fibrillation.  She denies any ongoing presyncope or syncope. She doesn't notice his palpitations. She's had no chest pressure, neck or arm discomfort. She's had no new shortness of breath, PND or orthopnea.  Allergies  Allergen Reactions  . Aspirin Nausea Only  . Codeine Nausea Only  . Sulfa Antibiotics   . Vicodin (Hydrocodone-Acetaminophen) Nausea Only  . Warfarin Sodium Other (See Comments)    Tingling all over    Current Outpatient Prescriptions  Medication Sig Dispense Refill  . Cholecalciferol (VITAMIN D3) 2000 UNITS TABS Take 1 tablet by mouth daily.        . Fe Fum-FePoly-Vit C-Vit B3 (INTEGRA) 62.5-62.5-40-3 MG CAPS Take 1 capsule by mouth daily.      . furosemide (LASIX) 40 MG tablet Take 40 mg by mouth daily.      Marland Kitchen KLOR-CON 10 10 MEQ tablet Take 10 mEq by mouth 1 day or 1 dose.      . levothyroxine (SYNTHROID, LEVOTHROID) 137 MCG tablet Take 137 mcg by mouth daily. Mon,Wed,Fri.      . lisinopril (PRINIVIL,ZESTRIL) 10 MG tablet Take 20 mg by mouth daily.       . Multiple Vitamins-Minerals (CENTRUM SILVER) tablet Take 1 tablet by mouth daily.        Marland Kitchen apixaban (ELIQUIS) 5 MG TABS tablet Take 1 tablet (5 mg total) by mouth 2 (two) times daily.  60 tablet  11   No current facility-administered medications for this visit.    Past Medical History  Diagnosis Date  . Hypothyroid   . Diaphragmatic hernia without mention of obstruction or gangrene   . Esophagitis   . Hyperlipidemia   . Prolapse of vaginal walls without mention of  uterine prolapse   . Arthritis   . First degree atrioventricular block   . AF (atrial fibrillation)   . Rectal polyp   . Gastric polyp   . Hypertension     Past Surgical History  Procedure Laterality Date  . Kidney stone surgery    . Dilation and curettage of uterus      ROS:  Positive for headache, swallowing, arthritis.   PHYSICAL EXAM BP 153/81  Pulse 60  Ht 5\' 4"  (1.626 m)  Wt 205 lb (92.987 kg)  BMI 35.17 kg/m2 GENERAL:  Well appearing NECK:  No jugular venous distention, waveform within normal limits, carotid upstroke brisk and symmetric, no bruits, no thyromegaly LUNGS:  Clear to auscultation bilaterally HEART:  PMI not displaced or sustained,S1 and S2 within normal limits, no S3, no clicks, no rubs, apical holosystolic murmur, 2/6, no diastolic murmurs, irregular ABD:  Flat, positive bowel sounds normal in frequency in pitch, no bruits, no rebound, no guarding, no midline pulsatile mass, no hepatomegaly, no splenomegaly EXT:  2 plus pulses throughout, mild edema, no cyanosis no clubbing   ASSESSMENT AND PLAN   ATRIAL FIBRILLATION:  She has a CHA2DS2 - VASc score of 4 with a risk of stroke of 4%  and a HAS - BLED score of 2 with a low risk of bleeding.  I  had another long conversation with her about the risk benefits of anticoagulation. I told her we need to work with her to try to make one of these medications afford. I'm going to have her go back and investigate the cost of the Eliquis and we gave her a free 30 day trial coupon.  If this turns out not to be affordable we'll have to investigate one of the other medications. He has reasonable rate control and she will otherwise remain on the medications as listed.   SYNCOPE:  She has had no further episodes of this. No further workup is planned.  MITRAL REGURGITATION:  This was mild. No change in therapy is indicated.  HTN:  The blood pressure is at target. No change in medications is indicated. We will continue with  therapeutic lifestyle changes (TLC).

## 2012-07-30 NOTE — Patient Instructions (Addendum)
The current medical regimen is effective;  continue present plan and medications.  Follow up in 6 months with Dr Hochrein.  You will receive a letter in the mail 2 months before you are due.  Please call us when you receive this letter to schedule your follow up appointment.  

## 2012-08-04 ENCOUNTER — Ambulatory Visit: Payer: Medicare Other | Attending: Orthopedic Surgery | Admitting: *Deleted

## 2012-08-04 DIAGNOSIS — M25619 Stiffness of unspecified shoulder, not elsewhere classified: Secondary | ICD-10-CM | POA: Insufficient documentation

## 2012-08-04 DIAGNOSIS — R5381 Other malaise: Secondary | ICD-10-CM | POA: Insufficient documentation

## 2012-08-04 DIAGNOSIS — M25519 Pain in unspecified shoulder: Secondary | ICD-10-CM | POA: Insufficient documentation

## 2012-08-04 DIAGNOSIS — IMO0001 Reserved for inherently not codable concepts without codable children: Secondary | ICD-10-CM | POA: Insufficient documentation

## 2012-08-06 ENCOUNTER — Ambulatory Visit: Payer: Medicare Other | Admitting: Physical Therapy

## 2012-08-11 ENCOUNTER — Ambulatory Visit: Payer: Medicare Other | Admitting: Physical Therapy

## 2012-08-12 ENCOUNTER — Ambulatory Visit: Payer: Medicare Other

## 2012-08-13 ENCOUNTER — Ambulatory Visit: Payer: Medicare Other | Admitting: Physical Therapy

## 2012-08-14 ENCOUNTER — Ambulatory Visit (INDEPENDENT_AMBULATORY_CARE_PROVIDER_SITE_OTHER): Payer: Medicare Other | Admitting: Family Medicine

## 2012-08-14 DIAGNOSIS — E538 Deficiency of other specified B group vitamins: Secondary | ICD-10-CM

## 2012-08-14 MED ORDER — CYANOCOBALAMIN 1000 MCG/ML IJ SOLN
1000.0000 ug | INTRAMUSCULAR | Status: DC
  Administered 2012-08-14: 1000 ug via INTRAMUSCULAR

## 2012-08-14 MED ORDER — CYANOCOBALAMIN 1000 MCG/ML IJ SOLN
1000.0000 ug | INTRAMUSCULAR | Status: AC
  Administered 2012-10-17 – 2013-02-27 (×5): 1000 ug via INTRAMUSCULAR

## 2012-08-14 NOTE — Patient Instructions (Signed)
Vitamin B12 Injections Every person needs vitamin B12. A deficiency develops when the body does not get enough of it. One way to overcome this is by getting B12 shots (injections). A B12 shot puts the vitamin directly into muscle tissue. This avoids any problems your body might have in absorbing it from food or a pill. In some people, the body has trouble using the vitamin correctly. This can cause a B12 deficiency. Not consuming enough of the vitamin can also cause a deficiency. Getting enough vitamin B12 can be hard for elderly people. Sometimes, they do not eat a well-balanced diet. The elderly are also more likely than younger people to have medical conditions or take medications that can lead to a deficiency. WHAT DOES VITAMIN B12 DO? Vitamin B12 does many things to help the body work right:  It helps the body make healthy red blood cells.  It helps maintain nerve cells.  It is involved in the body's process of converting food into energy (metabolism).  It is needed to make the genetic material in all cells (DNA). VITAMIN B12 FOOD SOURCES Most people get plenty of vitamin B12 through the foods they eat. It is present in:  Meat, fish, poultry, and eggs.  Milk and milk products.  It also is added when certain foods are made, including some breads, cereals and yogurts. The food is then called "fortified". CAUSES The most common causes of vitamin B12 deficiency are:  Pernicious anemia. The condition develops when the body cannot make enough healthy red blood cells. This stems from a lack of a protein made in the stomach (intrinsic factor). People without this protein cannot absorb enough vitamin B12 from food.  Malabsorption. This is when the body cannot absorb the vitamin. It can be caused by:  Pernicious anemia.  Surgery to remove part or all of the stomach can lead to malabsorption. Removal of part or all of the small intestine can also cause malabsorption.  Vegetarian diet.  People who are strict about not eating foods from animals could have trouble taking in enough vitamin B12 from diet alone.  Medications. Some medicines have been linked to B12 deficiency, such as Metformin (a drug prescribed for type 2 diabetes). Long-term use of stomach acid suppressants also can keep the vitamin from being absorbed.  Intestinal problems such as inflammatory bowel disease. If there are problems in the digestive tract, vitamin B12 may not be absorbed in good enough amounts. SYMPTOMS People who do not get enough B12 can develop problems. These can include:  Anemia. This is when the body has too few red blood cells. Red blood cells carry oxygen to the rest of the body. Without a healthy supply of red blood cells, people can feel:  Tired (fatigued).  Weak.  Severe anemia can cause:  Shortness of breath.  Dizziness.  Rapid heart rate.  Paleness.  Other Vitamin B12 deficiency symptoms include:  Diarrhea.  Numbness or tingling in the hands or feet.  Loss of appetite.  Confusion.  Sores on the tongue or in the mouth. LET YOUR CAREGIVER KNOW ABOUT:  Any allergies. It is very important to know if you are allergic or sensitive to cobalt. Vitamin B12 contains cobalt.  Any history of kidney disease.  All medications you are taking. Include prescription and over-the-counter medicines, herbs and creams.  Whether you are pregnant or breast-feeding.  If you have Leber's disease, a hereditary eye condition, vitamin B12 could make it worse. RISKS AND COMPLICATIONS Reactions to an injection are   usually temporary. They might include:  Pain at the injection site.  Redness, swelling or tenderness at the site.  Headache, dizziness or weakness.  Nausea, upset stomach or diarrhea.  Numbness or tingling.  Fever.  Joint pain.  Itching or rash. If a reaction does not go away in a short while, talk with your healthcare provider. A change in the way the shots are  given, or where they are given, might need to be made. BEFORE AN INJECTION To decide whether B12 injections are right for you, your healthcare provider will probably:  Ask about your medical history.  Ask questions about your diet.  Ask about symptoms such as:  Have you felt weak?  Do you feel unusually tired?  Do you get dizzy?  Order blood tests. These may include a test to:  Check the level of red cells in your blood.  Measure B12 levels.  Check for the presence of intrinsic factor. VITAMIN B12 INJECTIONS How often you will need a vitamin B12 injection will depend on how severe your deficiency is. This also will affect how long you will need to get them. People with pernicious anemia usually get injections for their entire life. Others might get them for a shorter period. For many people, injections are given daily or weekly for several weeks. Then, once B12 levels are normal, injections are given just once a month. If the cause of the deficiency can be fixed, the injections can be stopped. Talk with your healthcare provider about what you should expect. For an injection:  The injection site will be cleaned with an alcohol swab.  Your healthcare provider will insert a needle directly into a muscle. Most any muscle can be used. Most often, an arm muscle is used. A buttocks muscle can also be used. Many people say shots in that area are less painful.  A small adhesive bandage may be put over the injection site. It usually can be taken off in an hour or less. Injections can be given by your healthcare provider. In some cases, family members give them. Sometimes, people give them to themselves. Talk with your healthcare provider about what would be best for you. If someone other than your healthcare provider will be giving the shots, the person will need to be trained to give them correctly. HOME CARE INSTRUCTIONS   You can remove the adhesive bandage within an hour of getting a  shot.  You should be able to go about your normal activities right away.  Avoid drinking large amounts of alcohol while taking vitamin B12 shots. Alcohol can interfere with the body's use of the vitamin. SEEK MEDICAL CARE IF:   Pain, redness, swelling or tenderness at the injection site does not get better or gets worse.  Headache, dizziness or weakness does not go away.  You develop a fever of more than 100.5 F (38.1 C). SEEK IMMEDIATE MEDICAL CARE IF:   You have chest pain.  You develop shortness of breath.  You have muscle weakness that gets worse.  You develop numbness, weakness or tingling on one side or one area of the body.  You have symptoms of an allergic reaction, such as:  Hives.  Difficulty breathing.  Swelling of the lips, face, tongue or throat.  You develop a fever of more than 102.0 F (38.9 C). MAKE SURE YOU:   Understand these instructions.  Will watch your condition.  Will get help right away if you are not doing well or get worse. Document   Released: 06/15/2008 Document Revised: 06/11/2011 Document Reviewed: 06/15/2008 [employer] Patient Information 2013 [employer].  

## 2012-08-28 ENCOUNTER — Other Ambulatory Visit: Payer: Self-pay | Admitting: *Deleted

## 2012-08-28 MED ORDER — FUROSEMIDE 40 MG PO TABS: 40.0000 mg | ORAL_TABLET | Freq: Every day | ORAL | Status: DC

## 2012-08-28 MED ORDER — POTASSIUM CHLORIDE ER 10 MEQ PO TBCR: 10.0000 meq | EXTENDED_RELEASE_TABLET | Freq: Every day | ORAL | Status: DC

## 2012-08-28 NOTE — Telephone Encounter (Signed)
Mail order, will print route to kay to call pt at [phone]

## 2012-08-28 NOTE — Telephone Encounter (Signed)
Pt's RXs at the front ready for pick up Left message on pt's machine

## 2012-09-16 ENCOUNTER — Ambulatory Visit (INDEPENDENT_AMBULATORY_CARE_PROVIDER_SITE_OTHER): Payer: Medicare Other

## 2012-09-16 DIAGNOSIS — E538 Deficiency of other specified B group vitamins: Secondary | ICD-10-CM

## 2012-09-16 MED ORDER — CYANOCOBALAMIN 1000 MCG/ML IJ SOLN
1000.0000 ug | Freq: Once | INTRAMUSCULAR | Status: AC
  Administered 2012-09-16: 1000 ug via INTRAMUSCULAR

## 2012-09-16 NOTE — Patient Instructions (Signed)

## 2012-09-16 NOTE — Progress Notes (Signed)
Tolerated injection well. 

## 2012-09-29 ENCOUNTER — Other Ambulatory Visit: Payer: Self-pay

## 2012-09-29 MED ORDER — LISINOPRIL 10 MG PO TABS: ORAL_TABLET | ORAL | Status: DC

## 2012-10-01 ENCOUNTER — Encounter: Payer: Self-pay | Admitting: Family Medicine

## 2012-10-01 ENCOUNTER — Ambulatory Visit (INDEPENDENT_AMBULATORY_CARE_PROVIDER_SITE_OTHER): Payer: Medicare Other | Admitting: Family Medicine

## 2012-10-01 VITALS — BP 131/73 | HR 59 | Temp 97.6°F | Ht 64.0 in | Wt 207.2 lb

## 2012-10-01 DIAGNOSIS — I4891 Unspecified atrial fibrillation: Secondary | ICD-10-CM

## 2012-10-01 DIAGNOSIS — E785 Hyperlipidemia, unspecified: Secondary | ICD-10-CM

## 2012-10-01 DIAGNOSIS — E559 Vitamin D deficiency, unspecified: Secondary | ICD-10-CM

## 2012-10-01 DIAGNOSIS — M1990 Unspecified osteoarthritis, unspecified site: Secondary | ICD-10-CM

## 2012-10-01 DIAGNOSIS — D649 Anemia, unspecified: Secondary | ICD-10-CM

## 2012-10-01 DIAGNOSIS — M129 Arthropathy, unspecified: Secondary | ICD-10-CM

## 2012-10-01 DIAGNOSIS — E039 Hypothyroidism, unspecified: Secondary | ICD-10-CM

## 2012-10-01 LAB — BASIC METABOLIC PANEL WITH GFR
BUN: 19 mg/dL (ref 6–23)
Chloride: 101 mEq/L (ref 96–112)
GFR, Est Non African American: 60 mL/min
Potassium: 4.1 mEq/L (ref 3.5–5.3)

## 2012-10-01 LAB — POCT CBC
Granulocyte percent: 59.6 %G (ref 37–80)
MCV: 91.3 fL (ref 80–97)
MPV: 7.8 fL (ref 0–99.8)
Platelet Count, POC: 167 10*3/uL (ref 142–424)
RBC: 3.4 M/uL — AB (ref 4.04–5.48)

## 2012-10-01 LAB — HEPATIC FUNCTION PANEL
Albumin: 4.2 g/dL (ref 3.5–5.2)
Total Bilirubin: 0.8 mg/dL (ref 0.3–1.2)

## 2012-10-01 NOTE — Patient Instructions (Addendum)
Fall precautions discused  Continue current meds and therapeutic lifestyle changes Watch sodium intake closely Drink more water Elevate feet when resting at home

## 2012-10-01 NOTE — Progress Notes (Signed)
  Subjective:    Patient ID: [name], female    DOB: [birth year], 79 y.o.   MRN: [id number]  HPI Patient comes in today for followup of chronic medical problems which include atrial fibrillation, hyperlipidemia, hypothyroidism, and general arthralgias. Also see the review of system. She has some general allergy is she use both inhalers and topical. Her health maintenance is up-to-date. Patient is still working at [employer] early morning work.   Review of Systems  Constitutional: Positive for fatigue (stable).  HENT: Positive for postnasal drip (intermitent, due to allergies). Negative for ear pain and sore throat.   Eyes: Positive for itching (due to allergies).  Respiratory: Negative.   Cardiovascular: Positive for leg swelling (some).  Gastrointestinal: Positive for constipation (occasional). Negative for abdominal pain.  Genitourinary: Positive for frequency (due to med).  Musculoskeletal: Positive for back pain (mid back > LBP) and arthralgias (all over, arthritis, L shoulder).  Skin: Negative.   Allergic/Immunologic: Positive for environmental allergies (seasonal).  Neurological: Positive for dizziness (intermitent) and headaches (top of head, comes and goes).  Psychiatric/Behavioral: Positive for sleep disturbance (wakes throughout the night, almost nightly). The patient is nervous/anxious (occasional depression).        Objective:   Physical Exam BP 131/73  Pulse 59  Temp(Src) 97.6 F (36.4 C) (Oral)  Ht 5\' 4"  (1.626 m)  Wt 207 lb 3.2 oz (93.985 kg)  BMI 35.55 kg/m2  The patient appeared well nourished and normally developed, alert and oriented to time and place. Speech, behavior and judgement appear normal. Vital signs as documented.  Head exam is unremarkable. No scleral icterus or pallor noted.  Neck is without jugular venous distension, thyromegally, or carotid bruits. Carotid upstrokes are brisk bilaterally. No cervical adenopathy. Lungs are clear anteriorly and  posteriorly to auscultation. Normal respiratory effort. Cardiac exam reveals regular irregular rate and rhythm at 60 per minute. First and second heart sounds normal.  No murmurs, rubs or gallops.  Abdominal exam reveals normal bowl sounds, no masses, no organomegaly and no aortic enlargement. No inguinal adenopathy. Slight epigastric and suprapubic tenderness. Extremities are nonedematous and both femoral and pedal pulses are normal. Skin without pallor or jaundice.  Warm and dry, without rash. Neurologic exam reveals normal deep tendon reflexes and normal sensation.          Assessment & Plan:  1. Anemia - POCT CBC; Standing - POCT CBC  2. Hyperlipemia - NMR Lipoprofile with Lipids; Standing - Hepatic function panel; Standing - NMR Lipoprofile with Lipids - Hepatic function panel  3. Hypothyroid  4. Atrial fibrillation   5. Vitamin D deficiency - Vitamin D 25 hydroxy; Standing - Vitamin D 25 hydroxy  6. Arthritis - BASIC METABOLIC PANEL WITH GFR; Standing - BASIC METABOLIC PANEL WITH GFR  Patient Instructions  Fall precautions discused  Continue current meds and therapeutic lifestyle changes Watch sodium intake closely Drink more water Elevate feet when resting at home    She is not taking anything for stroke prevention due to the atrial fibrillation

## 2012-10-02 LAB — NMR LIPOPROFILE WITH LIPIDS
HDL Size: 9 nm — ABNORMAL LOW (ref >=9.2)
HDL-C: 47 mg/dL (ref >=40)
LDL (calc): 108 mg/dL — ABNORMAL HIGH (ref <100)
LDL Size: 20.6 nm (ref >20.5)
LP-IR Score: 47 — ABNORMAL HIGH (ref <=45)

## 2012-10-02 LAB — VITAMIN D 25 HYDROXY (VIT D DEFICIENCY, FRACTURES): Vit D, 25-Hydroxy: 55 ng/mL (ref 30–89)

## 2012-10-06 ENCOUNTER — Other Ambulatory Visit: Payer: Self-pay

## 2012-10-06 MED ORDER — LISINOPRIL 10 MG PO TABS: ORAL_TABLET | ORAL | Status: DC

## 2012-10-15 ENCOUNTER — Telehealth: Payer: Self-pay | Admitting: *Deleted

## 2012-10-15 NOTE — Telephone Encounter (Signed)
Pt notified of results

## 2012-10-15 NOTE — Telephone Encounter (Signed)
Message copied by Bearl Mulberry on Wed Oct 15, 2012  6:30 PM ------      Message from: Ernestina Penna      Created: Thu Oct 02, 2012  9:52 AM       Electrolytes blood sugar and kidney function tests all within normal limits.      Vitamin D is good and within normal limits.      The total LDL particle number is 1138. The goal is to be less than 1000. Triglycerides are slightly elevated. The good cholesterol or the HDL particle number is very good.------- try to do better with therapeutic lifestyle changes which include exercise and.diet.      All liver function tests within normal limits            Make sure that we got in touch with the cardiologist regarding her antiplatelet therapy. Make sure that we confirm the need for a CT scan of her Dr.Pahel ------

## 2012-10-16 ENCOUNTER — Other Ambulatory Visit (INDEPENDENT_AMBULATORY_CARE_PROVIDER_SITE_OTHER): Payer: Medicare Other

## 2012-10-16 DIAGNOSIS — Z1212 Encounter for screening for malignant neoplasm of rectum: Secondary | ICD-10-CM

## 2012-10-17 ENCOUNTER — Ambulatory Visit (INDEPENDENT_AMBULATORY_CARE_PROVIDER_SITE_OTHER): Payer: Medicare Other | Admitting: *Deleted

## 2012-10-17 DIAGNOSIS — E538 Deficiency of other specified B group vitamins: Secondary | ICD-10-CM

## 2012-10-17 LAB — FECAL OCCULT BLOOD, IMMUNOCHEMICAL: Fecal Occult Blood: POSITIVE — AB

## 2012-10-17 NOTE — Progress Notes (Signed)
Patient tolerated well.

## 2012-10-17 NOTE — Patient Instructions (Addendum)
Vitamin B12 Injections Every person needs vitamin B12. A deficiency develops when the body does not get enough of it. One way to overcome this is by getting B12 shots (injections). A B12 shot puts the vitamin directly into muscle tissue. This avoids any problems your body might have in absorbing it from food or a pill. In some people, the body has trouble using the vitamin correctly. This can cause a B12 deficiency. Not consuming enough of the vitamin can also cause a deficiency. Getting enough vitamin B12 can be hard for elderly people. Sometimes, they do not eat a well-balanced diet. The elderly are also more likely than younger people to have medical conditions or take medications that can lead to a deficiency. WHAT DOES VITAMIN B12 DO? Vitamin B12 does many things to help the body work right:  It helps the body make healthy red blood cells.  It helps maintain nerve cells.  It is involved in the body's process of converting food into energy (metabolism).  It is needed to make the genetic material in all cells (DNA). VITAMIN B12 FOOD SOURCES Most people get plenty of vitamin B12 through the foods they eat. It is present in:  Meat, fish, poultry, and eggs.  Milk and milk products.  It also is added when certain foods are made, including some breads, cereals and yogurts. The food is then called "fortified". CAUSES The most common causes of vitamin B12 deficiency are:  Pernicious anemia. The condition develops when the body cannot make enough healthy red blood cells. This stems from a lack of a protein made in the stomach (intrinsic factor). People without this protein cannot absorb enough vitamin B12 from food.  Malabsorption. This is when the body cannot absorb the vitamin. It can be caused by:  Pernicious anemia.  Surgery to remove part or all of the stomach can lead to malabsorption. Removal of part or all of the small intestine can also cause malabsorption.  Vegetarian diet.  People who are strict about not eating foods from animals could have trouble taking in enough vitamin B12 from diet alone.  Medications. Some medicines have been linked to B12 deficiency, such as Metformin (a drug prescribed for type 2 diabetes). Long-term use of stomach acid suppressants also can keep the vitamin from being absorbed.  Intestinal problems such as inflammatory bowel disease. If there are problems in the digestive tract, vitamin B12 may not be absorbed in good enough amounts. SYMPTOMS People who do not get enough B12 can develop problems. These can include:  Anemia. This is when the body has too few red blood cells. Red blood cells carry oxygen to the rest of the body. Without a healthy supply of red blood cells, people can feel:  Tired (fatigued).  Weak.  Severe anemia can cause:  Shortness of breath.  Dizziness.  Rapid heart rate.  Paleness.  Other Vitamin B12 deficiency symptoms include:  Diarrhea.  Numbness or tingling in the hands or feet.  Loss of appetite.  Confusion.  Sores on the tongue or in the mouth. LET YOUR CAREGIVER KNOW ABOUT:  Any allergies. It is very important to know if you are allergic or sensitive to cobalt. Vitamin B12 contains cobalt.  Any history of kidney disease.  All medications you are taking. Include prescription and over-the-counter medicines, herbs and creams.  Whether you are pregnant or breast-feeding.  If you have Leber's disease, a hereditary eye condition, vitamin B12 could make it worse. RISKS AND COMPLICATIONS Reactions to an injection are   usually temporary. They might include:  Pain at the injection site.  Redness, swelling or tenderness at the site.  Headache, dizziness or weakness.  Nausea, upset stomach or diarrhea.  Numbness or tingling.  Fever.  Joint pain.  Itching or rash. If a reaction does not go away in a short while, talk with your healthcare provider. A change in the way the shots are  given, or where they are given, might need to be made. BEFORE AN INJECTION To decide whether B12 injections are right for you, your healthcare provider will probably:  Ask about your medical history.  Ask questions about your diet.  Ask about symptoms such as:  Have you felt weak?  Do you feel unusually tired?  Do you get dizzy?  Order blood tests. These may include a test to:  Check the level of red cells in your blood.  Measure B12 levels.  Check for the presence of intrinsic factor. VITAMIN B12 INJECTIONS How often you will need a vitamin B12 injection will depend on how severe your deficiency is. This also will affect how long you will need to get them. People with pernicious anemia usually get injections for their entire life. Others might get them for a shorter period. For many people, injections are given daily or weekly for several weeks. Then, once B12 levels are normal, injections are given just once a month. If the cause of the deficiency can be fixed, the injections can be stopped. Talk with your healthcare provider about what you should expect. For an injection:  The injection site will be cleaned with an alcohol swab.  Your healthcare provider will insert a needle directly into a muscle. Most any muscle can be used. Most often, an arm muscle is used. A buttocks muscle can also be used. Many people say shots in that area are less painful.  A small adhesive bandage may be put over the injection site. It usually can be taken off in an hour or less. Injections can be given by your healthcare provider. In some cases, family members give them. Sometimes, people give them to themselves. Talk with your healthcare provider about what would be best for you. If someone other than your healthcare provider will be giving the shots, the person will need to be trained to give them correctly. HOME CARE INSTRUCTIONS   You can remove the adhesive bandage within an hour of getting a  shot.  You should be able to go about your normal activities right away.  Avoid drinking large amounts of alcohol while taking vitamin B12 shots. Alcohol can interfere with the body's use of the vitamin. SEEK MEDICAL CARE IF:   Pain, redness, swelling or tenderness at the injection site does not get better or gets worse.  Headache, dizziness or weakness does not go away.  You develop a fever of more than 100.5 F (38.1 C). SEEK IMMEDIATE MEDICAL CARE IF:   You have chest pain.  You develop shortness of breath.  You have muscle weakness that gets worse.  You develop numbness, weakness or tingling on one side or one area of the body.  You have symptoms of an allergic reaction, such as:  Hives.  Difficulty breathing.  Swelling of the lips, face, tongue or throat.  You develop a fever of more than 102.0 F (38.9 C). MAKE SURE YOU:   Understand these instructions.  Will watch your condition.  Will get help right away if you are not doing well or get worse. Document   Released: 06/15/2008 Document Revised: 06/11/2011 Document Reviewed: 06/15/2008 Exit[employer] Patient Information 2014 [employer].  

## 2012-10-22 ENCOUNTER — Telehealth: Payer: Self-pay | Admitting: Family Medicine

## 2012-10-30 NOTE — Telephone Encounter (Signed)
Integra too expensive Pharmacist stated Estelle June was the generic equivalent Call this be changed and called into [facility]

## 2012-10-30 NOTE — Telephone Encounter (Signed)
This is okay to change this

## 2012-10-30 NOTE — Telephone Encounter (Signed)
RX for Ferrex called into [facility] Pt notified

## 2012-11-11 ENCOUNTER — Ambulatory Visit (INDEPENDENT_AMBULATORY_CARE_PROVIDER_SITE_OTHER): Payer: Medicare Other

## 2012-11-11 ENCOUNTER — Ambulatory Visit (INDEPENDENT_AMBULATORY_CARE_PROVIDER_SITE_OTHER): Payer: Medicare Other | Admitting: General Practice

## 2012-11-11 VITALS — BP 130/80 | HR 88 | Temp 99.0°F | Ht 64.0 in | Wt 203.0 lb

## 2012-11-11 DIAGNOSIS — R05 Cough: Secondary | ICD-10-CM

## 2012-11-11 DIAGNOSIS — J209 Acute bronchitis, unspecified: Secondary | ICD-10-CM

## 2012-11-11 MED ORDER — METHYLPREDNISOLONE ACETATE 40 MG/ML IJ SUSP
40.0000 mg | Freq: Once | INTRAMUSCULAR | Status: AC
  Administered 2012-11-11: 40 mg via INTRAMUSCULAR

## 2012-11-11 NOTE — Progress Notes (Signed)
  Subjective:    Patient ID: [name], female    DOB: November 12, 1932, 79 y.o.   MRN: [id number]  HPI Patient presents today with complaints of coughing which started on Saturday. She reports having a low grade fever, but denies checking temperature. She reports having soreness in ribs from coughing. She reports taking antibiotics levofloxacin 500mg  one tablet daily and taking for past three days. She reports feeling better.     Review of Systems  Constitutional: Positive for fever. Negative for chills.  Respiratory: Positive for cough. Negative for chest tightness and shortness of breath.        Productive cough (yellowish, thick)  Cardiovascular: Negative for chest pain and palpitations.  Neurological: Negative for dizziness, weakness and headaches.       Objective:   Physical Exam  Constitutional: She is oriented to person, place, and time. She appears well-developed and well-nourished.  Cardiovascular: Normal rate, regular rhythm and normal heart sounds.   Pulmonary/Chest: Effort normal. No respiratory distress. She has wheezes. She exhibits no tenderness.  Slight bilateral upper expiratory wheezing noted  Neurological: She is alert and oriented to person, place, and time.  Skin: Skin is warm and dry.  Psychiatric: She has a normal mood and affect.   [facility] reading (PRIMARY) by Coralie Keens, FNP-C, no acute process noted.                                         Assessment & Plan:  1. Coughing - DG Chest 2 View; Future  2. Acute bronchitis - methylPREDNISolone acetate (DEPO-MEDROL) injection 40 mg; Inject 1 mL (40 mg total) into the muscle once. -continue current antibiotic of Levofloxacin 500mg  daily thru Friday for total of 7 days -increase fluid intake (water) -RTO if symptoms worsen and in 1 week for follow up -Patient verbalized understanding -Coralie Keens, FNP-C

## 2012-11-11 NOTE — Patient Instructions (Addendum)

## 2012-11-19 ENCOUNTER — Encounter: Payer: Self-pay | Admitting: General Practice

## 2012-11-19 ENCOUNTER — Ambulatory Visit (INDEPENDENT_AMBULATORY_CARE_PROVIDER_SITE_OTHER): Payer: Medicare Other | Admitting: General Practice

## 2012-11-19 ENCOUNTER — Ambulatory Visit (INDEPENDENT_AMBULATORY_CARE_PROVIDER_SITE_OTHER): Payer: Medicare Other | Admitting: *Deleted

## 2012-11-19 VITALS — BP 152/71 | HR 53 | Temp 97.7°F | Ht 64.0 in | Wt 207.0 lb

## 2012-11-19 DIAGNOSIS — E538 Deficiency of other specified B group vitamins: Secondary | ICD-10-CM

## 2012-11-19 DIAGNOSIS — R05 Cough: Secondary | ICD-10-CM

## 2012-11-19 NOTE — Progress Notes (Signed)
  Subjective:    Patient ID: [name], female    DOB: 1932-12-27, 79 y.o.   MRN: [id number]  HPI Patient presents today for follow up of bronchitis. Reports she is feeling better and coughing is less, but not completely gone. Reports cough now is a dry cough and occurs may four times daily. Reports she has completed medications prescribed. She also reports coughing starts periodically when she begins to eat.     Review of Systems  Constitutional: Negative for fever and chills.  Respiratory: Positive for cough. Negative for chest tightness and shortness of breath.   Cardiovascular: Negative for chest pain and palpitations.  Neurological: Negative for dizziness, weakness and light-headedness.       Objective:   Physical Exam  Constitutional: She is oriented to person, place, and time. She appears well-developed and well-nourished.  HENT:  Head: Normocephalic and atraumatic.  Mouth/Throat: Oropharynx is clear and moist.  Pulmonary/Chest: Effort normal and breath sounds normal. No respiratory distress. She exhibits no tenderness.  Neurological: She is alert and oriented to person, place, and time.  Skin: Skin is warm and dry.  Psychiatric: She has a normal mood and affect.          Assessment & Plan:  1. Cough -RTO if cough hasn't resolved in 1 week -discussed silent aspiration while eating, informed that swallowing study could help diagnose, patient declined swallowing evaluation at this time -reports she will follow up if cough doesn't resolved -Patient verbalized understanding -Coralie Keens, FNP-C

## 2012-11-19 NOTE — Progress Notes (Signed)
Patient ID: [name], female   DOB: 07/16/1932, 80 y.o.   MRN: [id number] Pt tolerated injection well 

## 2012-11-19 NOTE — Patient Instructions (Signed)

## 2012-12-18 ENCOUNTER — Other Ambulatory Visit: Payer: Self-pay

## 2012-12-18 MED ORDER — FUROSEMIDE 40 MG PO TABS: 40.0000 mg | ORAL_TABLET | Freq: Every day | ORAL | Status: DC

## 2012-12-18 MED ORDER — POTASSIUM CHLORIDE ER 10 MEQ PO TBCR: 10.0000 meq | EXTENDED_RELEASE_TABLET | Freq: Every day | ORAL | Status: DC

## 2012-12-18 MED ORDER — LEVOTHYROXINE SODIUM 137 MCG PO TABS: 137.0000 ug | ORAL_TABLET | Freq: Every day | ORAL | Status: DC

## 2012-12-18 NOTE — Telephone Encounter (Signed)
Last seen 11/19/12  DWM   Last thyroid 02/20/12   If approved print and route to nurse for mail order

## 2012-12-24 ENCOUNTER — Ambulatory Visit (INDEPENDENT_AMBULATORY_CARE_PROVIDER_SITE_OTHER): Payer: Medicare Other | Admitting: *Deleted

## 2012-12-24 DIAGNOSIS — Z23 Encounter for immunization: Secondary | ICD-10-CM

## 2012-12-24 DIAGNOSIS — E538 Deficiency of other specified B group vitamins: Secondary | ICD-10-CM

## 2012-12-24 NOTE — Progress Notes (Signed)
Patient ID: [name], female   DOB: 18-May-1932, 79 y.o.   MRN: [id number] Pt tolerated injection well

## 2012-12-24 NOTE — Addendum Note (Signed)
Addended by: Ardine Eng A on: 12/24/2012 11:42 AM   Modules accepted: Orders

## 2012-12-25 ENCOUNTER — Emergency Department (HOSPITAL_COMMUNITY): Payer: Medicare Other

## 2012-12-25 ENCOUNTER — Encounter (HOSPITAL_COMMUNITY): Payer: Self-pay

## 2012-12-25 ENCOUNTER — Emergency Department (HOSPITAL_COMMUNITY)
Admission: EM | Admit: 2012-12-25 | Discharge: 2012-12-25 | Disposition: A | Discharge status: Home / Self Care | Payer: Medicare Other | Attending: Emergency Medicine | Admitting: Emergency Medicine

## 2012-12-25 DIAGNOSIS — E785 Hyperlipidemia, unspecified: Secondary | ICD-10-CM | POA: Insufficient documentation

## 2012-12-25 DIAGNOSIS — I10 Essential (primary) hypertension: Secondary | ICD-10-CM | POA: Insufficient documentation

## 2012-12-25 DIAGNOSIS — Z79899 Other long term (current) drug therapy: Secondary | ICD-10-CM | POA: Insufficient documentation

## 2012-12-25 DIAGNOSIS — Z85028 Personal history of other malignant neoplasm of stomach: Secondary | ICD-10-CM | POA: Insufficient documentation

## 2012-12-25 DIAGNOSIS — Z8739 Personal history of other diseases of the musculoskeletal system and connective tissue: Secondary | ICD-10-CM | POA: Insufficient documentation

## 2012-12-25 DIAGNOSIS — Z8742 Personal history of other diseases of the female genital tract: Secondary | ICD-10-CM | POA: Insufficient documentation

## 2012-12-25 DIAGNOSIS — Z8719 Personal history of other diseases of the digestive system: Secondary | ICD-10-CM | POA: Insufficient documentation

## 2012-12-25 DIAGNOSIS — J159 Unspecified bacterial pneumonia: Secondary | ICD-10-CM | POA: Insufficient documentation

## 2012-12-25 DIAGNOSIS — R0602 Shortness of breath: Secondary | ICD-10-CM | POA: Insufficient documentation

## 2012-12-25 DIAGNOSIS — J189 Pneumonia, unspecified organism: Secondary | ICD-10-CM

## 2012-12-25 DIAGNOSIS — E039 Hypothyroidism, unspecified: Secondary | ICD-10-CM | POA: Insufficient documentation

## 2012-12-25 LAB — LIPASE, BLOOD: Lipase: 43 U/L (ref 11–59)

## 2012-12-25 LAB — COMPREHENSIVE METABOLIC PANEL
AST: 27 U/L (ref 0–37)
Albumin: 4.2 g/dL (ref 3.5–5.2)
Alkaline Phosphatase: 88 U/L (ref 39–117)
BUN: 22 mg/dL (ref 6–23)
CO2: 30 mEq/L (ref 19–32)
Potassium: 4.3 mEq/L (ref 3.5–5.1)
Sodium: 139 mEq/L (ref 135–145)
Total Bilirubin: 0.5 mg/dL (ref 0.3–1.2)
Total Protein: 7.8 g/dL (ref 6.0–8.3)

## 2012-12-25 LAB — CBC WITH DIFFERENTIAL/PLATELET
Basophils Absolute: 0 10*3/uL (ref 0.0–0.1)
Basophils Relative: 0 % (ref 0–1)
Eosinophils Absolute: 0.2 10*3/uL (ref 0.0–0.7)
HCT: 36.4 % (ref 36.0–46.0)
Lymphocytes Relative: 13 % (ref 12–46)
MCH: 31.2 pg (ref 26.0–34.0)
MCHC: 33 g/dL (ref 30.0–36.0)
Monocytes Absolute: 0.7 10*3/uL (ref 0.1–1.0)
Monocytes Relative: 8 % (ref 3–12)
Neutrophils Relative %: 77 % (ref 43–77)
Platelets: 212 10*3/uL (ref 150–400)
RDW: 13.9 % (ref 11.5–15.5)
WBC: 8.4 10*3/uL (ref 4.0–10.5)

## 2012-12-25 MED ORDER — IOHEXOL 350 MG/ML SOLN
100.0000 mL | Freq: Once | INTRAVENOUS | Status: AC | PRN
  Administered 2012-12-25: 100 mL via INTRAVENOUS

## 2012-12-25 MED ORDER — ONDANSETRON HCL 4 MG/2ML IJ SOLN
4.0000 mg | Freq: Once | INTRAMUSCULAR | Status: AC
  Administered 2012-12-25: 4 mg via INTRAVENOUS
  Filled 2012-12-25: qty 2

## 2012-12-25 MED ORDER — OXYCODONE-ACETAMINOPHEN 5-325 MG PO TABS: 0.5000 | ORAL_TABLET | ORAL | Status: DC | PRN

## 2012-12-25 MED ORDER — LEVOFLOXACIN 750 MG PO TABS: 750.0000 mg | ORAL_TABLET | Freq: Every day | ORAL | Status: DC

## 2012-12-25 MED ORDER — MORPHINE SULFATE 2 MG/ML IJ SOLN
2.0000 mg | Freq: Once | INTRAMUSCULAR | Status: AC
  Administered 2012-12-25: 2 mg via INTRAVENOUS
  Filled 2012-12-25: qty 1

## 2012-12-25 NOTE — ED Provider Notes (Signed)
CSN: [id number]     Arrival date & time 12/25/12  0435 History   First MD Initiated Contact with Patient 12/25/12 [phone]     Chief Complaint  Patient presents with  . Pain with breathing    (Consider location/radiation/quality/duration/timing/severity/associated sxs/prior Treatment) HPI Comments: Patient presents to the ER for evaluation of pain in the left side of her chest. Patient reports that it started around noon yesterday. This reports a sharp pain that worsens when she takes a deep breath. Patient denies any injury. She has not had fever or cough. She does report that she had a flu shot yesterday, was given in the left deltoid. She does have some aching pain around the area of the shunt as well.   Past Medical History  Diagnosis Date  . Hypothyroid   . Diaphragmatic hernia without mention of obstruction or gangrene   . Esophagitis   . Hyperlipidemia   . Prolapse of vaginal walls without mention of uterine prolapse   . Arthritis   . First degree atrioventricular block   . AF (atrial fibrillation)   . Rectal polyp   . Gastric polyp   . Hypertension    Past Surgical History  Procedure Laterality Date  . Kidney stone surgery    . Dilation and curettage of uterus    . Dilation and curettage of uterus     Family History  Problem Relation Age of Onset  . Hypertension Mother   . Aneurysm Mother     Brain  . Hypertension Father   . Kidney disease Father    History  Substance Use Topics  . Smoking status: Never Smoker   . Smokeless tobacco: Not on file  . Alcohol Use: No   OB History   Grav Para Term Preterm Abortions TAB SAB Ect Mult Living                 Review of Systems  Constitutional: Negative for fever.  Respiratory: Positive for shortness of breath.   Cardiovascular: Positive for chest pain.  All other systems reviewed and are negative.    Allergies  Aspirin; Codeine; Sulfa antibiotics; Vicodin; and Warfarin sodium  Home Medications   Current  Outpatient Rx  Name  Route  Sig  Dispense  Refill  . Cholecalciferol (VITAMIN D3) 2000 UNITS TABS   Oral   Take 1 tablet by mouth daily.           . ferrous fumarate-iron polysaccharide complex (TANDEM) 162-115.2 MG CAPS   Oral   Take 1 capsule by mouth daily with breakfast.         . furosemide (LASIX) 40 MG tablet   Oral   Take 1 tablet (40 mg total) by mouth daily.   90 tablet   1   . levothyroxine (SYNTHROID, LEVOTHROID) 137 MCG tablet   Oral   Take 1 tablet (137 mcg total) by mouth daily. Mon,Wed,Fri.   90 tablet   0   . lisinopril (PRINIVIL,ZESTRIL) 10 MG tablet      One tablet daily   90 tablet   1   . Multiple Vitamins-Minerals (CENTRUM SILVER) tablet   Oral   Take 1 tablet by mouth daily.           . potassium chloride (KLOR-CON 10) 10 MEQ tablet   Oral   Take 1 tablet (10 mEq total) by mouth daily.   90 tablet   1    BP 193/73  Pulse 69  Temp(Src) 97.8 F (36.6  C) (Oral)  Resp 22  Ht 5' 4.5" (1.638 m)  Wt 200 lb (90.719 kg)  BMI 33.81 kg/m2  SpO2 100% Physical Exam  Constitutional: She is oriented to person, place, and time. She appears well-developed and well-nourished. No distress.  HENT:  Head: Normocephalic and atraumatic.  Right Ear: Hearing normal.  Left Ear: Hearing normal.  Nose: Nose normal.  Mouth/Throat: Oropharynx is clear and moist and mucous membranes are normal.  Eyes: Conjunctivae and EOM are normal. Pupils are equal, round, and reactive to light.  Neck: Normal range of motion. Neck supple.  Cardiovascular: Regular rhythm, S1 normal and S2 normal.  Exam reveals no gallop and no friction rub.   No murmur heard. Pulmonary/Chest: Effort normal and breath sounds normal. No respiratory distress. She exhibits no tenderness.  Abdominal: Soft. Normal appearance and bowel sounds are normal. There is no hepatosplenomegaly. There is no tenderness. There is no rebound, no guarding, no tenderness at McBurney's point and negative Murphy's  sign. No hernia.  Musculoskeletal: Normal range of motion.  Neurological: She is alert and oriented to person, place, and time. She has normal strength. No cranial nerve deficit or sensory deficit. Coordination normal. GCS eye subscore is 4. GCS verbal subscore is 5. GCS motor subscore is 6.  Skin: Skin is warm, dry and intact. No rash noted. No cyanosis.  Psychiatric: She has a normal mood and affect. Her speech is normal and behavior is normal. Thought content normal.    ED Course  Procedures (including critical care time)  EKG:  Date: 12/25/2012  Rate: 72  Rhythm: atrial fibrillation  QRS Axis: left  Intervals: normal  ST/T Wave abnormalities: normal  Conduction Disutrbances:none  Narrative Interpretation:   Old EKG Reviewed: afib now present   Labs Review Labs Reviewed  CBC WITH DIFFERENTIAL - Abnormal; Notable for the following:    RBC 3.85 (*)    All other components within normal limits  COMPREHENSIVE METABOLIC PANEL - Abnormal; Notable for the following:    Glucose, Bld 118 (*)    GFR calc non Af Amer 62 (*)    GFR calc Af Amer 72 (*)    All other components within normal limits  LIPASE, BLOOD  TROPONIN I   Imaging Review Dg Chest 2 View  12/25/2012   CLINICAL DATA:  Left-sided chest pain and shortness of breath.  EXAM: CHEST  2 VIEW  COMPARISON:  PA and lateral chest 05/17/2012 and 11/11/2012.  FINDINGS: There is cardiomegaly. Patchy bibasilar airspace disease appears slightly worse on the left. No pleural effusion is identified. No pneumothorax is seen.  IMPRESSION: Patchy basilar airspace disease appears worse on the left and could be due to atelectasis or pneumonia.  Cardiomegaly.   Electronically Signed   By: Drusilla Kanner M.D.   On: 12/25/2012 05:43   Ct Angio Chest Pe W/cm &/or Wo Cm  12/25/2012   CLINICAL DATA:  Left pleuritic chest pain  EXAM: CT ANGIOGRAPHY CHEST WITH CONTRAST  TECHNIQUE: Multidetector CT imaging of the chest was performed using the  standard protocol during bolus administration of intravenous contrast. Multiplanar CT image reconstructions including MIPs were obtained to evaluate the vascular anatomy.  CONTRAST:  OMNIPAQUE IOHEXOL 350 MG/ML SOLN  COMPARISON:  Chest x-ray December 25, 2012  FINDINGS: There is no pulmonary embolus. There is no mediastinal or hilar lymphadenopathy. Heart size is enlarged. There is minimal pericardial effusion. There is a small left pleural effusion. There is patchy consolidation of the left lower lobe. There  is mild dependent atelectasis of the posterior right lung. The visualized upper abdominal structures are unremarkable. There is a hiatal hernia. No acute abnormality is identified in the visualized bones. Degenerative joint changes of the spine are noted.  Review of the MIP images confirms the above findings.  IMPRESSION: No pulmonary embolus. Small left pleural effusion with patchy consolidation of the left lung base suspicious for pneumonia.   Electronically Signed   By: Sherian Rein   On: 12/25/2012 07:27    MDM  Diagnosis: CAP  Patient presents to ER for evaluation of pain in the left side of her chest region. Patient reports that the pain worsens when she takes a breath. She has a slight cough, is not significantly short of breath. This pain is sharp and pleuritic, it is atypical for cardiac chest pain. PE was, however, considered. CT scan does not show any evidence of PE, but does confirm pneumonia in the left base that explains the patient's pain. Patient's oxygenation is normal, does not require hospitalization at this time. Patient to be treated with Levaquin and pain medication.    Gilda Crease, MD 12/25/12 [phone]

## 2012-12-25 NOTE — ED Notes (Signed)
Patient with no complaints at this time. Respirations even and unlabored. Skin warm/dry. Discharge instructions reviewed with patient at this time. Patient given opportunity to voice concerns/ask questions. IV removed per policy and band-aid applied to site. Patient discharged at this time and left Emergency Department with steady gait.  

## 2012-12-25 NOTE — ED Notes (Signed)
Pt states she started having pain in her left ribs and left shoulder.  Pt states she had a flu shot yesterday also.

## 2012-12-29 ENCOUNTER — Ambulatory Visit (INDEPENDENT_AMBULATORY_CARE_PROVIDER_SITE_OTHER): Payer: Self-pay | Admitting: *Deleted

## 2012-12-29 NOTE — Progress Notes (Signed)
Patient ID: [name], female   DOB: [birth year], 79 y.o.   MRN: [id number] Pt needed hospital follow up Appt scheduled

## 2012-12-30 ENCOUNTER — Encounter: Payer: Self-pay | Admitting: Family Medicine

## 2012-12-30 ENCOUNTER — Ambulatory Visit (INDEPENDENT_AMBULATORY_CARE_PROVIDER_SITE_OTHER): Payer: Medicare Other | Admitting: Family Medicine

## 2012-12-30 VITALS — BP 122/73 | HR 65 | Temp 99.1°F | Ht 64.5 in | Wt 207.0 lb

## 2012-12-30 DIAGNOSIS — J189 Pneumonia, unspecified organism: Secondary | ICD-10-CM

## 2012-12-30 DIAGNOSIS — D649 Anemia, unspecified: Secondary | ICD-10-CM

## 2012-12-30 LAB — POCT CBC
Lymph, poc: 1.9 (ref 0.6–3.4)
MCH, POC: 29.8 pg (ref 27–31.2)
MCHC: 33.4 g/dL (ref 31.8–35.4)
MCV: 89.3 fL (ref 80–97)
MPV: 7 fL (ref 0–99.8)
POC LYMPH PERCENT: 30.4 %L (ref 10–50)
Platelet Count, POC: 219 10*3/uL (ref 142–424)
RBC: 3.7 M/uL — AB (ref 4.04–5.48)
RDW, POC: 14 %

## 2012-12-30 NOTE — Patient Instructions (Addendum)
Continue current medications. Continue good therapeutic lifestyle changes.  Fall precautions discussed with patient. Follow up as planned and earlier as needed. Finish antibiotic Do not return to work until next week Drink plenty of fluids Repeat chest x-ray in about 4 weeks

## 2012-12-30 NOTE — Progress Notes (Signed)
Subjective:    Patient ID: [name], female    DOB: Aug 02, 1932, 79 y.o.   MRN: [id number]  HPI Patient here today for hospital follow up - went to [facility] on 12-25-12 for CAP. Patient comes to clinic today following her discharge from the hospital for community-acquired pneumonia 1 week ago. She is on Levaquin 750 and she has 2 more pills to take one for today and one for tomorrow . She was feeling so good that she went back to work yesterday and today. The patient did have a CT scan and it showed a small left pleural effusion and pneumonia.   Patient Active Problem List   Diagnosis Date Noted  . Unspecified hypothyroidism   . Urinary tract infection, site not specified   . Diaphragmatic hernia without mention of obstruction or gangrene   . Esophagitis   . Other and unspecified hyperlipidemia   . Prolapse of vaginal walls without mention of uterine prolapse   . Arthritis   . First degree atrioventricular block   . Symptomatic menopausal or female climacteric states   . AF (atrial fibrillation)   . Rectal polyp   . Gastric polyp    Outpatient Encounter Prescriptions as of 12/30/2012  Medication Sig Dispense Refill  . Cholecalciferol (VITAMIN D3) 2000 UNITS TABS Take 1 tablet by mouth daily.        . ferrous fumarate-iron polysaccharide complex (TANDEM) 162-115.2 MG CAPS Take 1 capsule by mouth daily with breakfast.      . furosemide (LASIX) 40 MG tablet Take 1 tablet (40 mg total) by mouth daily.  90 tablet  1  . levofloxacin (LEVAQUIN) 750 MG tablet Take 1 tablet (750 mg total) by mouth daily. X 7 days  7 tablet  0  . levothyroxine (SYNTHROID, LEVOTHROID) 137 MCG tablet Take 1 tablet (137 mcg total) by mouth daily. Mon,Wed,Fri.  90 tablet  0  . lisinopril (PRINIVIL,ZESTRIL) 10 MG tablet One tablet daily  90 tablet  1  . Multiple Vitamins-Minerals (CENTRUM SILVER) tablet Take 1 tablet by mouth daily.        Marland Kitchen oxyCODONE-acetaminophen (PERCOCET) 5-325 MG per tablet Take 0.5-1  tablets by mouth every 4 (four) hours as needed for pain.  10 tablet  0  . potassium chloride (KLOR-CON 10) 10 MEQ tablet Take 1 tablet (10 mEq total) by mouth daily.  90 tablet  1  . [DISCONTINUED] KLOR-CON 10 10 MEQ tablet Take 1 tablet by mouth daily.       Facility-Administered Encounter Medications as of 12/30/2012  Medication Dose Route Frequency Provider Last Rate Last Dose  . cyanocobalamin ((VITAMIN B-12)) injection 1,000 mcg  1,000 mcg Intramuscular Q30 days Ernestina Penna, MD   1,000 mcg at 12/24/12 1100      Review of Systems  Constitutional: Negative.  Negative for fever.  HENT: Negative.   Eyes: Negative.   Respiratory: Positive for shortness of breath (getting better). Negative for cough.   Cardiovascular: Negative.   Gastrointestinal: Negative.   Endocrine: Negative.   Genitourinary: Negative.   Musculoskeletal: Positive for back pain.  Skin: Negative.   Allergic/Immunologic: Negative.   Neurological: Negative.   Hematological: Negative.   Psychiatric/Behavioral: Negative.        Objective:   Physical Exam  Nursing note and vitals reviewed. Constitutional: She is oriented to person, place, and time. She appears well-developed and well-nourished. No distress.  HENT:  Head: Normocephalic and atraumatic.  Right Ear: External ear normal.  Left Ear: External ear  normal.  Nose: Nose normal.  Mouth/Throat: Oropharynx is clear and moist. No oropharyngeal exudate.  Eyes: Conjunctivae and EOM are normal. Right eye exhibits no discharge. Left eye exhibits no discharge.  Neck: Normal range of motion. Neck supple. No thyromegaly present.  Cardiovascular: Normal rate, regular rhythm and normal heart sounds.  Exam reveals no gallop and no friction rub.   No murmur heard. At 72 per minute  Pulmonary/Chest: Effort normal and breath sounds normal. No respiratory distress. She has no wheezes. She has no rales.  Musculoskeletal: Normal range of motion.  Lymphadenopathy:     She has no cervical adenopathy.  Neurological: She is alert and oriented to person, place, and time.  Skin: Skin is warm and dry. No rash noted.  Psychiatric: She has a normal mood and affect. Her behavior is normal. Judgment and thought content normal.   BP 122/73  Pulse 65  Temp(Src) 99.1 F (37.3 C) (Oral)  Ht 5' 4.5" (1.638 m)  Wt 207 lb (93.895 kg)  BMI 35 kg/m2        Assessment & Plan:   1. CAP (community acquired pneumonia)   2. Anemia    Repeat CBC today Chest x-ray in 4 week  Patient Instructions  Continue current medications. Continue good therapeutic lifestyle changes.  Fall precautions discussed with patient. Follow up as planned and earlier as needed. Finish antibiotic Do not return to work until next week Drink plenty of fluids Repeat chest x-ray in about 4 weeks     Nyra Capes MD

## 2013-01-26 ENCOUNTER — Encounter (INDEPENDENT_AMBULATORY_CARE_PROVIDER_SITE_OTHER): Payer: Self-pay

## 2013-01-26 ENCOUNTER — Ambulatory Visit (INDEPENDENT_AMBULATORY_CARE_PROVIDER_SITE_OTHER): Payer: Medicare Other | Admitting: *Deleted

## 2013-01-26 DIAGNOSIS — E538 Deficiency of other specified B group vitamins: Secondary | ICD-10-CM

## 2013-02-04 ENCOUNTER — Other Ambulatory Visit (INDEPENDENT_AMBULATORY_CARE_PROVIDER_SITE_OTHER): Payer: Medicare Other

## 2013-02-04 DIAGNOSIS — J189 Pneumonia, unspecified organism: Secondary | ICD-10-CM

## 2013-02-27 ENCOUNTER — Ambulatory Visit (INDEPENDENT_AMBULATORY_CARE_PROVIDER_SITE_OTHER): Payer: Medicare Other | Admitting: *Deleted

## 2013-02-27 DIAGNOSIS — E538 Deficiency of other specified B group vitamins: Secondary | ICD-10-CM

## 2013-02-27 NOTE — Progress Notes (Signed)
Vitamin b12 injection given and tolerated well.  

## 2013-02-27 NOTE — Patient Instructions (Signed)
Vitamin B12 Injections Every person needs vitamin B12. A deficiency develops when the body does not get enough of it. One way to overcome this is by getting B12 shots (injections). A B12 shot puts the vitamin directly into muscle tissue. This avoids any problems your body might have in absorbing it from food or a pill. In some people, the body has trouble using the vitamin correctly. This can cause a B12 deficiency. Not consuming enough of the vitamin can also cause a deficiency. Getting enough vitamin B12 can be hard for elderly people. Sometimes, they do not eat a well-balanced diet. The elderly are also more likely than younger people to have medical conditions or take medications that can lead to a deficiency. WHAT DOES VITAMIN B12 DO? Vitamin B12 does many things to help the body work right:  It helps the body make healthy red blood cells.  It helps maintain nerve cells.  It is involved in the body's process of converting food into energy (metabolism).  It is needed to make the genetic material in all cells (DNA). VITAMIN B12 FOOD SOURCES Most people get plenty of vitamin B12 through the foods they eat. It is present in:  Meat, fish, poultry, and eggs.  Milk and milk products.  It also is added when certain foods are made, including some breads, cereals and yogurts. The food is then called "fortified". CAUSES The most common causes of vitamin B12 deficiency are:  Pernicious anemia. The condition develops when the body cannot make enough healthy red blood cells. This stems from a lack of a protein made in the stomach (intrinsic factor). People without this protein cannot absorb enough vitamin B12 from food.  Malabsorption. This is when the body cannot absorb the vitamin. It can be caused by:  Pernicious anemia.  Surgery to remove part or all of the stomach can lead to malabsorption. Removal of part or all of the small intestine can also cause malabsorption.  Vegetarian diet.  People who are strict about not eating foods from animals could have trouble taking in enough vitamin B12 from diet alone.  Medications. Some medicines have been linked to B12 deficiency, such as Metformin (a drug prescribed for type 2 diabetes). Long-term use of stomach acid suppressants also can keep the vitamin from being absorbed.  Intestinal problems such as inflammatory bowel disease. If there are problems in the digestive tract, vitamin B12 may not be absorbed in good enough amounts. SYMPTOMS People who do not get enough B12 can develop problems. These can include:  Anemia. This is when the body has too few red blood cells. Red blood cells carry oxygen to the rest of the body. Without a healthy supply of red blood cells, people can feel:  Tired (fatigued).  Weak.  Severe anemia can cause:  Shortness of breath.  Dizziness.  Rapid heart rate.  Paleness.  Other Vitamin B12 deficiency symptoms include:  Diarrhea.  Numbness or tingling in the hands or feet.  Loss of appetite.  Confusion.  Sores on the tongue or in the mouth. LET YOUR CAREGIVER KNOW ABOUT:  Any allergies. It is very important to know if you are allergic or sensitive to cobalt. Vitamin B12 contains cobalt.  Any history of kidney disease.  All medications you are taking. Include prescription and over-the-counter medicines, herbs and creams.  Whether you are pregnant or breast-feeding.  If you have Leber's disease, a hereditary eye condition, vitamin B12 could make it worse. RISKS AND COMPLICATIONS Reactions to an injection are   usually temporary. They might include:  Pain at the injection site.  Redness, swelling or tenderness at the site.  Headache, dizziness or weakness.  Nausea, upset stomach or diarrhea.  Numbness or tingling.  Fever.  Joint pain.  Itching or rash. If a reaction does not go away in a short while, talk with your healthcare provider. A change in the way the shots are  given, or where they are given, might need to be made. BEFORE AN INJECTION To decide whether B12 injections are right for you, your healthcare provider will probably:  Ask about your medical history.  Ask questions about your diet.  Ask about symptoms such as:  Have you felt weak?  Do you feel unusually tired?  Do you get dizzy?  Order blood tests. These may include a test to:  Check the level of red cells in your blood.  Measure B12 levels.  Check for the presence of intrinsic factor. VITAMIN B12 INJECTIONS How often you will need a vitamin B12 injection will depend on how severe your deficiency is. This also will affect how long you will need to get them. People with pernicious anemia usually get injections for their entire life. Others might get them for a shorter period. For many people, injections are given daily or weekly for several weeks. Then, once B12 levels are normal, injections are given just once a month. If the cause of the deficiency can be fixed, the injections can be stopped. Talk with your healthcare provider about what you should expect. For an injection:  The injection site will be cleaned with an alcohol swab.  Your healthcare provider will insert a needle directly into a muscle. Most any muscle can be used. Most often, an arm muscle is used. A buttocks muscle can also be used. Many people say shots in that area are less painful.  A small adhesive bandage may be put over the injection site. It usually can be taken off in an hour or less. Injections can be given by your healthcare provider. In some cases, family members give them. Sometimes, people give them to themselves. Talk with your healthcare provider about what would be best for you. If someone other than your healthcare provider will be giving the shots, the person will need to be trained to give them correctly. HOME CARE INSTRUCTIONS   You can remove the adhesive bandage within an hour of getting a  shot.  You should be able to go about your normal activities right away.  Avoid drinking large amounts of alcohol while taking vitamin B12 shots. Alcohol can interfere with the body's use of the vitamin. SEEK MEDICAL CARE IF:   Pain, redness, swelling or tenderness at the injection site does not get better or gets worse.  Headache, dizziness or weakness does not go away.  You develop a fever of more than 100.5 F (38.1 C). SEEK IMMEDIATE MEDICAL CARE IF:   You have chest pain.  You develop shortness of breath.  You have muscle weakness that gets worse.  You develop numbness, weakness or tingling on one side or one area of the body.  You have symptoms of an allergic reaction, such as:  Hives.  Difficulty breathing.  Swelling of the lips, face, tongue or throat.  You develop a fever of more than 102.0 F (38.9 C). MAKE SURE YOU:   Understand these instructions.  Will watch your condition.  Will get help right away if you are not doing well or get worse. Document   Released: 06/15/2008 Document Revised: 06/11/2011 Document Reviewed: 06/15/2008 Exit[employer] Patient Information 2014 [employer].  

## 2013-03-11 ENCOUNTER — Ambulatory Visit (INDEPENDENT_AMBULATORY_CARE_PROVIDER_SITE_OTHER): Payer: Medicare Other | Admitting: Family Medicine

## 2013-03-16 ENCOUNTER — Encounter: Payer: Self-pay | Admitting: Family Medicine

## 2013-03-16 ENCOUNTER — Ambulatory Visit (INDEPENDENT_AMBULATORY_CARE_PROVIDER_SITE_OTHER): Payer: Medicare Other | Admitting: Family Medicine

## 2013-03-16 VITALS — BP 141/76 | HR 57 | Temp 98.7°F | Ht 64.5 in | Wt 202.0 lb

## 2013-03-16 DIAGNOSIS — E538 Deficiency of other specified B group vitamins: Secondary | ICD-10-CM

## 2013-03-16 DIAGNOSIS — I4891 Unspecified atrial fibrillation: Secondary | ICD-10-CM

## 2013-03-16 DIAGNOSIS — Z23 Encounter for immunization: Secondary | ICD-10-CM

## 2013-03-16 DIAGNOSIS — I10 Essential (primary) hypertension: Secondary | ICD-10-CM

## 2013-03-16 DIAGNOSIS — E039 Hypothyroidism, unspecified: Secondary | ICD-10-CM

## 2013-03-16 DIAGNOSIS — E785 Hyperlipidemia, unspecified: Secondary | ICD-10-CM

## 2013-03-16 DIAGNOSIS — D509 Iron deficiency anemia, unspecified: Secondary | ICD-10-CM

## 2013-03-16 DIAGNOSIS — E559 Vitamin D deficiency, unspecified: Secondary | ICD-10-CM

## 2013-03-16 NOTE — Addendum Note (Signed)
Addended by: Orma Render F on: 03/16/2013 02:14 PM   Modules accepted: Orders

## 2013-03-16 NOTE — Addendum Note (Signed)
Addended by: Kingston Shawgo F on: 03/16/2013 02:14 PM   Modules accepted: Orders  

## 2013-03-16 NOTE — Progress Notes (Signed)
Subjective:    Patient ID: [name], female    DOB: 01/11/1933, 79 y.o.   MRN: [id number]  HPI Pt here for follow up and management of chronic medical problems. Patient has multiple problems with no particular complaints. On health maintenance she is past due on her mammogram and her Pap and pelvic exam. She will get her lab work today and she will also get a  Prevnar shot today.       Patient Active Problem List   Diagnosis Date Noted  . Unspecified hypothyroidism   . Urinary tract infection, site not specified   . Diaphragmatic hernia without mention of obstruction or gangrene   . Esophagitis   . Other and unspecified hyperlipidemia   . Prolapse of vaginal walls without mention of uterine prolapse   . Arthritis   . First degree atrioventricular block   . Symptomatic menopausal or female climacteric states   . AF (atrial fibrillation)   . Rectal polyp   . Gastric polyp    Outpatient Encounter Prescriptions as of 03/16/2013  Medication Sig  . Cholecalciferol (VITAMIN D3) 2000 UNITS TABS Take 1 tablet by mouth daily.    . ferrous fumarate-iron polysaccharide complex (TANDEM) 162-115.2 MG CAPS Take 1 capsule by mouth daily with breakfast.  . furosemide (LASIX) 40 MG tablet Take 1 tablet (40 mg total) by mouth daily.  Marland Kitchen levothyroxine (SYNTHROID, LEVOTHROID) 137 MCG tablet Take 1 tablet (137 mcg total) by mouth daily. Mon,Wed,Fri.  . lisinopril (PRINIVIL,ZESTRIL) 10 MG tablet One tablet daily  . Multiple Vitamins-Minerals (CENTRUM SILVER) tablet Take 1 tablet by mouth daily.    . potassium chloride (KLOR-CON 10) 10 MEQ tablet Take 1 tablet (10 mEq total) by mouth daily.  . [DISCONTINUED] oxyCODONE-acetaminophen (PERCOCET) 5-325 MG per tablet Take 0.5-1 tablets by mouth every 4 (four) hours as needed for pain.  . [DISCONTINUED] levofloxacin (LEVAQUIN) 750 MG tablet Take 1 tablet (750 mg total) by mouth daily. X 7 days    Review of Systems  Constitutional: Negative.     HENT: Negative.   Eyes: Negative.   Respiratory: Negative.   Cardiovascular: Negative.   Gastrointestinal: Negative.   Endocrine: Negative.   Genitourinary: Negative.   Musculoskeletal: Negative.   Skin: Negative.   Allergic/Immunologic: Negative.   Neurological: Negative.   Hematological: Negative.   Psychiatric/Behavioral: Negative.        Objective:   Physical Exam  Nursing note and vitals reviewed. Constitutional: She is oriented to person, place, and time. She appears well-developed and well-nourished. No distress.  HENT:  Head: Normocephalic and atraumatic.  Right Ear: External ear normal.  Left Ear: External ear normal.  Nose: Nose normal.  Mouth/Throat: Oropharynx is clear and moist.  Eyes: Conjunctivae and EOM are normal. Pupils are equal, round, and reactive to light. Right eye exhibits no discharge. Left eye exhibits no discharge. No scleral icterus.  Neck: Normal range of motion. Neck supple. No JVD present. No thyromegaly present.  Cardiovascular: Normal rate, normal heart sounds and intact distal pulses.  Exam reveals no gallop and no friction rub.   No murmur heard. Slightly irregular at 60 per minute  Pulmonary/Chest: Effort normal and breath sounds normal. No respiratory distress. She has no wheezes. She has no rales. She exhibits no tenderness.  Abdominal: Soft. Bowel sounds are normal. She exhibits no mass. There is no tenderness. There is no rebound and no guarding.  Musculoskeletal: Normal range of motion. She exhibits no edema and no tenderness.  Lymphadenopathy:  She has no cervical adenopathy.  Neurological: She is alert and oriented to person, place, and time. She has normal reflexes. No cranial nerve deficit.  Skin: Skin is warm and dry.  Psychiatric: She has a normal mood and affect. Her behavior is normal. Judgment and thought content normal.   BP 141/76  Pulse 57  Temp(Src) 98.7 F (37.1 C) (Oral)  Ht 5' 4.5" (1.638 m)  Wt 202 lb (91.627  kg)  BMI 34.15 kg/m2        Assessment & Plan:  1. Unspecified hypothyroidism - POCT CBC  2. Other and unspecified hyperlipidemia - POCT CBC - Hepatic function panel - BMP8+EGFR - NMR, lipoprofile  3. AF (atrial fibrillation) - POCT CBC -patient is to followup visit with the cardiologist and we will try to help her arrange this  4. Need for prophylactic vaccination against Streptococcus pneumoniae (pneumococcus) - Pneumococcal conjugate vaccine 13-valent  5. Vitamin D deficiency - Vit D  25 hydroxy (rtn osteoporosis monitoring)  6. HTN (hypertension) - Hepatic function panel - BMP8+EGFR  7. Anemia, iron deficiency  8. B12 deficiency   No orders of the defined types were placed in this encounter.   Patient Instructions  Continue current medications. Continue good therapeutic lifestyle changes which include good diet and exercise. Fall precautions discussed with patient. Schedule your flu vaccine if you haven't had it yet If you are over 79 years old - you may need Prevnar 13 or the adult Pneumonia vaccine. Patient will get a B12 shot and a Prevnar shot at the same time and that appointment comes up. Please do not forget to get your pelvic exam and mammogram done. We will call you with the results of the lab work once it is available.   Nyra Capes MD

## 2013-03-16 NOTE — Patient Instructions (Addendum)
Continue current medications. Continue good therapeutic lifestyle changes which include good diet and exercise. Fall precautions discussed with patient. Schedule your flu vaccine if you haven't had it yet If you are over 77 years old - you may need Prevnar 13 or the adult Pneumonia vaccine. Patient will get a B12 shot and a Prevnar shot at the same time and that appointment comes up. Please do not forget to get your pelvic exam and mammogram done. We will call you with the results of the lab work once it is available.

## 2013-03-17 LAB — CBC WITH DIFFERENTIAL
Eos: 5 %
Eosinophils Absolute: 0.2 10*3/uL (ref 0.0–0.4)
HCT: 34.3 % (ref 34.0–46.6)
Hemoglobin: 11.6 g/dL (ref 11.1–15.9)
Immature Grans (Abs): 0 10*3/uL (ref 0.0–0.1)
Immature Granulocytes: 0 %
Lymphocytes Absolute: 1.3 10*3/uL (ref 0.7–3.1)
MCH: 30.6 pg (ref 26.6–33.0)
MCV: 91 fL (ref 79–97)
Monocytes: 9 %
Neutrophils Absolute: 2.9 10*3/uL (ref 1.4–7.0)
Platelets: 232 10*3/uL (ref 150–379)
RBC: 3.79 x10E6/uL (ref 3.77–5.28)
WBC: 4.9 10*3/uL (ref 3.4–10.8)

## 2013-03-18 LAB — BMP8+EGFR
BUN: 20 mg/dL (ref 8–27)
Calcium: 9.8 mg/dL (ref 8.6–10.2)
Creatinine, Ser: 1.06 mg/dL — ABNORMAL HIGH (ref 0.57–1.00)
GFR calc non Af Amer: 50 mL/min/{1.73_m2} — ABNORMAL LOW (ref >59)
Glucose: 90 mg/dL (ref 65–99)

## 2013-03-18 LAB — HEPATIC FUNCTION PANEL
ALT: 12 IU/L (ref 0–32)
Total Bilirubin: 0.6 mg/dL (ref 0.0–1.2)

## 2013-03-18 LAB — NMR, LIPOPROFILE
HDL Cholesterol by NMR: 59 mg/dL (ref >=40)
LDL Particle Number: 1108 nmol/L — ABNORMAL HIGH (ref <1000)
LDLC SERPL CALC-MCNC: 95 mg/dL (ref <100)
Triglycerides by NMR: 110 mg/dL (ref <150)

## 2013-03-30 ENCOUNTER — Ambulatory Visit (INDEPENDENT_AMBULATORY_CARE_PROVIDER_SITE_OTHER): Payer: Medicare Other | Admitting: Family Medicine

## 2013-03-30 DIAGNOSIS — E538 Deficiency of other specified B group vitamins: Secondary | ICD-10-CM

## 2013-03-30 MED ORDER — CYANOCOBALAMIN 1000 MCG/ML IJ SOLN
1000.0000 ug | INTRAMUSCULAR | Status: AC
  Administered 2013-03-30 – 2019-04-20 (×70): 1000 ug via INTRAMUSCULAR

## 2013-03-31 ENCOUNTER — Encounter: Payer: Self-pay | Admitting: General Practice

## 2013-03-31 ENCOUNTER — Telehealth: Payer: Self-pay | Admitting: General Practice

## 2013-03-31 ENCOUNTER — Ambulatory Visit (INDEPENDENT_AMBULATORY_CARE_PROVIDER_SITE_OTHER): Payer: Medicare Other | Admitting: General Practice

## 2013-03-31 VITALS — BP 154/76 | HR 64 | Temp 96.7°F | Ht 64.5 in | Wt 206.0 lb

## 2013-03-31 DIAGNOSIS — Z0000 Encounter for general adult medical examination without abnormal findings: Secondary | ICD-10-CM

## 2013-03-31 DIAGNOSIS — Z01419 Encounter for gynecological examination (general) (routine) without abnormal findings: Secondary | ICD-10-CM

## 2013-03-31 MED ORDER — LEVOTHYROXINE SODIUM 137 MCG PO TABS: 137.0000 ug | ORAL_TABLET | Freq: Every day | ORAL | Status: DC

## 2013-03-31 MED ORDER — LISINOPRIL 10 MG PO TABS: ORAL_TABLET | ORAL | Status: DC

## 2013-03-31 NOTE — Addendum Note (Signed)
Addended by: Magdalene River on: 03/31/2013 02:04 PM   Modules accepted: Orders

## 2013-03-31 NOTE — Telephone Encounter (Signed)
This is okay to refill both of these prescriptions for one year. The patient comes in regularly to be checked .

## 2013-03-31 NOTE — Addendum Note (Signed)
Addended by: Ernestina Penna on: 03/31/2013 01:01 PM   Modules accepted: Orders

## 2013-03-31 NOTE — Progress Notes (Signed)
   Subjective:    Patient ID: [name], female    DOB: 1932-12-28, 79 y.o.   MRN: [id number]  HPI Patient presents today for annual exam (pap only). She denies any concerns at this time.     Review of Systems  Constitutional: Negative for fever and chills.  Respiratory: Negative for chest tightness and shortness of breath.   Cardiovascular: Negative for chest pain and palpitations.  Genitourinary: Negative for dysuria, hematuria, vaginal bleeding, difficulty urinating and pelvic pain.  All other systems reviewed and are negative.       Objective:   Physical Exam  Constitutional: She is oriented to person, place, and time. She appears well-developed and well-nourished.  HENT:  Head: Normocephalic and atraumatic.  Right Ear: External ear normal.  Left Ear: External ear normal.  Mouth/Throat: Oropharynx is clear and moist.  Eyes: Conjunctivae and EOM are normal. Pupils are equal, round, and reactive to light.  Neck: Normal range of motion. Neck supple. No thyromegaly present.  Cardiovascular: Normal rate, regular rhythm, normal heart sounds and intact distal pulses.   Pulmonary/Chest: Effort normal and breath sounds normal. No respiratory distress. Right breast exhibits no inverted nipple, no mass, no nipple discharge, no skin change and no tenderness. Left breast exhibits no inverted nipple, no mass, no nipple discharge, no skin change and no tenderness. Breasts are symmetrical.  Abdominal: Soft. Bowel sounds are normal. She exhibits no distension.  Genitourinary: Vagina normal and uterus normal. Rectal exam shows no fissure and no tenderness. Guaiac negative stool. No breast swelling, tenderness, discharge or bleeding. No labial fusion. There is no rash, tenderness, lesion or injury on the right labia. There is no rash, tenderness, lesion or injury on the left labia. Uterus is not deviated, not enlarged, not fixed and not tender. Cervix exhibits no motion tenderness, no discharge  and no friability. Right adnexum displays no mass, no tenderness and no fullness. Left adnexum displays no mass, no tenderness and no fullness. No erythema, tenderness or bleeding around the vagina. No foreign body around the vagina. No signs of injury around the vagina. No vaginal discharge found.  Lymphadenopathy:    She has no cervical adenopathy.  Neurological: She is alert and oriented to person, place, and time.  Skin: Skin is warm and dry.  Psychiatric: She has a normal mood and affect.          Assessment & Plan:  1. Annual physical exam - Pap IG w/ reflex to HPV when ASC-U -RTO for routine follow up -Patient verbalized understanding Coralie Keens, FNP-C

## 2013-03-31 NOTE — Telephone Encounter (Signed)
Patient is requesting 1 year refill of lisinopril and levothyroxine.

## 2013-03-31 NOTE — Patient Instructions (Signed)

## 2013-03-31 NOTE — Telephone Encounter (Signed)
Please print and sign for mail order

## 2013-04-01 LAB — PAP IG W/ RFLX HPV ASCU

## 2013-04-08 ENCOUNTER — Ambulatory Visit (INDEPENDENT_AMBULATORY_CARE_PROVIDER_SITE_OTHER): Payer: Medicare Other | Admitting: Cardiology

## 2013-04-08 ENCOUNTER — Encounter: Payer: Self-pay | Admitting: Cardiology

## 2013-04-08 VITALS — BP 158/74 | HR 60 | Ht 64.5 in | Wt 211.0 lb

## 2013-04-08 DIAGNOSIS — I4891 Unspecified atrial fibrillation: Secondary | ICD-10-CM

## 2013-04-08 DIAGNOSIS — I10 Essential (primary) hypertension: Secondary | ICD-10-CM

## 2013-04-08 NOTE — Patient Instructions (Signed)
The current medical regimen is effective;  continue present plan and medications.  Follow up in 6 months with Dr Antoine PocheHochrein.  You will receive a letter in the mail 2 months before you are due.  Please call us when you receive this letter to schedule your follow up appointment.  Blood thinners:  Eliquis, Pradaxa or warfarin.  (Warfarin will require atleast monthly blood work)

## 2013-04-08 NOTE — Progress Notes (Signed)
HPI The patient presents for evaluation of syncope. This was probably related to orthostasis. At the last visit we again had the discussion about stroke risk.  She did not start Eliquis because of cost.  The patient denies any new symptoms such as chest discomfort, neck or arm discomfort. There has been no new shortness of breath, PND or orthopnea. There have been no reported palpitations, presyncope or syncope.  She has some dizziness at times when she turns her head.    Allergies  Allergen Reactions  . Aspirin Nausea Only  . Codeine Nausea Only  . Sulfa Antibiotics   . Vicodin [Hydrocodone-Acetaminophen] Nausea Only  . Warfarin Sodium Other (See Comments)    Tingling all over    Current Outpatient Prescriptions  Medication Sig Dispense Refill  . Cholecalciferol (VITAMIN D3) 2000 UNITS TABS Take 1 tablet by mouth daily.        . ferrous fumarate-iron polysaccharide complex (TANDEM) 162-115.2 MG CAPS Take 1 capsule by mouth daily with breakfast.      . furosemide (LASIX) 40 MG tablet Take 1 tablet (40 mg total) by mouth daily.  90 tablet  1  . levothyroxine (SYNTHROID, LEVOTHROID) 137 MCG tablet Take 1 tablet (137 mcg total) by mouth daily. Mon,Wed,Fri.  90 tablet  3  . lisinopril (PRINIVIL,ZESTRIL) 10 MG tablet One tablet daily  90 tablet  3  . Multiple Vitamins-Minerals (CENTRUM SILVER) tablet Take 1 tablet by mouth daily.        . potassium chloride (KLOR-CON 10) 10 MEQ tablet Take 1 tablet (10 mEq total) by mouth daily.  90 tablet  1   Current Facility-Administered Medications  Medication Dose Route Frequency Provider Last Rate Last Dose  . cyanocobalamin ((VITAMIN B-12)) injection 1,000 mcg  1,000 mcg Intramuscular Q30 days Ernestina Pennaonald W Moore, MD   1,000 mcg at 03/30/13 1106    Past Medical History  Diagnosis Date  . Hypothyroid   . Diaphragmatic hernia without mention of obstruction or gangrene   . Esophagitis   . Hyperlipidemia   . Prolapse of vaginal walls without mention  of uterine prolapse   . Arthritis   . First degree atrioventricular block   . AF (atrial fibrillation)   . Rectal polyp   . Gastric polyp   . Hypertension     Past Surgical History  Procedure Laterality Date  . Kidney stone surgery    . Dilation and curettage of uterus    . Dilation and curettage of uterus      ROS:  Positive for headache, swallowing, arthritis.   PHYSICAL EXAM BP 158/74  Pulse 60  Ht 5' 4.5" (1.638 m)  Wt 211 lb (95.709 kg)  BMI 35.67 kg/m2 GENERAL:  Well appearing NECK:  No jugular venous distention, waveform within normal limits, carotid upstroke brisk and symmetric, no bruits, no thyromegaly LUNGS:  Clear to auscultation bilaterally HEART:  PMI not displaced or sustained,S1 and S2 within normal limits, no S3, no clicks, no rubs, apical holosystolic murmur, 2/6, no diastolic murmurs, irregular ABD:  Flat, positive bowel sounds normal in frequency in pitch, no bruits, no rebound, no guarding, no midline pulsatile mass, no hepatomegaly, no splenomegaly EXT:  2 plus pulses throughout, mild edema, no cyanosis no clubbing   ASSESSMENT AND PLAN   ATRIAL FIBRILLATION:  She has a CHA2DS2 - VASc score of 4 with a risk of stroke of 4%  and a HAS - BLED score of 2 with a low risk of bleeding.  I had  another long conversation with her about the risk benefits of anticoagulation. I told her we need to work with her to try to make one of these medications afford. We again had a long discussion about this.  She will again look into whether she can afford Pradaxa or Eliquis or consider taking warfarin.  She understands the risks of no anticoag  SYNCOPE:  She has had no further episodes of this. No further workup is planned.  MITRAL REGURGITATION:  This was mild. No change in therapy is indicated.  HTN:  The blood pressure is at target. No change in medications is indicated. We will continue with therapeutic lifestyle changes (TLC).   HPI The patient presents for  evaluation of syncope. This was probably related to orthostasis. At the last visit she was started on a Eliquis but she didn't take this because it was too extensive. She did not call us to discuss this. She did have an echo which I reviewed with her today which demonstrated a preserved ejection fraction and only mild mitral regurgitation. A Holter monitor demonstrated that her heart rate was well-controlled in persistent fibrillation.  She denies any ongoing presyncope or syncope. She doesn't notice his palpitations. She's had no chest pressure, neck or arm discomfort. She's had no new shortness of breath, PND or orthopnea.  Allergies  Allergen Reactions  . Aspirin Nausea Only  . Codeine Nausea Only  . Sulfa Antibiotics   . Vicodin [Hydrocodone-Acetaminophen] Nausea Only  . Warfarin Sodium Other (See Comments)    Tingling all over    Current Outpatient Prescriptions  Medication Sig Dispense Refill  . Cholecalciferol (VITAMIN D3) 2000 UNITS TABS Take 1 tablet by mouth daily.        . ferrous fumarate-iron polysaccharide complex (TANDEM) 162-115.2 MG CAPS Take 1 capsule by mouth daily with breakfast.      . furosemide (LASIX) 40 MG tablet Take 1 tablet (40 mg total) by mouth daily.  90 tablet  1  . levothyroxine (SYNTHROID, LEVOTHROID) 137 MCG tablet Take 1 tablet (137 mcg total) by mouth daily. Mon,Wed,Fri.  90 tablet  3  . lisinopril (PRINIVIL,ZESTRIL) 10 MG tablet One tablet daily  90 tablet  3  . Multiple Vitamins-Minerals (CENTRUM SILVER) tablet Take 1 tablet by mouth daily.        . potassium chloride (KLOR-CON 10) 10 MEQ tablet Take 1 tablet (10 mEq total) by mouth daily.  90 tablet  1   Current Facility-Administered Medications  Medication Dose Route Frequency Provider Last Rate Last Dose  . cyanocobalamin ((VITAMIN B-12)) injection 1,000 mcg  1,000 mcg Intramuscular Q30 days Ernestina Penna, MD   1,000 mcg at 03/30/13 1106    Past Medical History  Diagnosis Date  . Hypothyroid     . Diaphragmatic hernia without mention of obstruction or gangrene   . Esophagitis   . Hyperlipidemia   . Prolapse of vaginal walls without mention of uterine prolapse   . Arthritis   . First degree atrioventricular block   . AF (atrial fibrillation)   . Rectal polyp   . Gastric polyp   . Hypertension     Past Surgical History  Procedure Laterality Date  . Kidney stone surgery    . Dilation and curettage of uterus    . Dilation and curettage of uterus      ROS:  Positive for headache, swallowing, arthritis.   PHYSICAL EXAM BP 158/74  Pulse 60  Ht 5' 4.5" (1.638 m)  Wt 211 lb (  95.709 kg)  BMI 35.67 kg/m2 GENERAL:  Well appearing NECK:  No jugular venous distention, waveform within normal limits, carotid upstroke brisk and symmetric, no bruits, no thyromegaly LUNGS:  Clear to auscultation bilaterally HEART:  PMI not displaced or sustained,S1 and S2 within normal limits, no S3, no clicks, no rubs, apical holosystolic murmur, 2/6, no diastolic murmurs, irregular ABD:  Flat, positive bowel sounds normal in frequency in pitch, no bruits, no rebound, no guarding, no midline pulsatile mass, no hepatomegaly, no splenomegaly EXT:  2 plus pulses throughout, mild edema, no cyanosis no clubbing   ASSESSMENT AND PLAN   ATRIAL FIBRILLATION:  She has a CHA2DS2 - VASc score of 4 with a risk of stroke of 4%  and a HAS - BLED score of 2 with a low risk of bleeding.  I had another long conversation with her about the risk benefits of anticoagulation. I told her we need to work with her to try to make one of these medications afford. I'm going to have her go back and investigate the cost of the Eliquis and we gave her a free 30 day trial coupon.  If this turns out not to be affordable we'll have to investigate one of the other medications. He has reasonable rate control and she will otherwise remain on the medications as listed.   SYNCOPE:  She has had no further episodes of this. No further  workup is planned.  MITRAL REGURGITATION:  This was mild. No change in therapy is indicated.  HTN:  The blood pressure is at target. No change in medications is indicated. We will continue with therapeutic lifestyle changes (TLC).

## 2013-04-22 ENCOUNTER — Ambulatory Visit: Payer: Medicare Other | Admitting: Cardiology

## 2013-04-28 ENCOUNTER — Ambulatory Visit (INDEPENDENT_AMBULATORY_CARE_PROVIDER_SITE_OTHER): Payer: Medicare Other | Admitting: Nurse Practitioner

## 2013-04-28 VITALS — BP 150/78 | HR 63 | Temp 97.3°F | Ht 64.5 in | Wt 207.0 lb

## 2013-04-28 DIAGNOSIS — J0100 Acute maxillary sinusitis, unspecified: Secondary | ICD-10-CM

## 2013-04-28 DIAGNOSIS — J029 Acute pharyngitis, unspecified: Secondary | ICD-10-CM

## 2013-04-28 LAB — POCT RAPID STREP A (OFFICE): RAPID STREP A SCREEN: NEGATIVE

## 2013-04-28 MED ORDER — AZITHROMYCIN 250 MG PO TABS
ORAL_TABLET | ORAL | Status: DC
Start: 2013-04-28 — End: 2013-06-23

## 2013-04-28 NOTE — Patient Instructions (Signed)

## 2013-04-28 NOTE — Progress Notes (Signed)
   Subjective:    Patient ID: [name], female    DOB: 21-Feb-1933, 80 y.o.   MRN: [id number]  HPI  Pateint here today with c/o cough and congestion- started 2weks ago but has gradually gotten worse. Has tried no OTC meds.    Review of Systems  Constitutional: Positive for chills and fatigue. Negative for fever.  HENT: Positive for congestion, rhinorrhea and sinus pressure. Negative for sore throat (scratchy) and trouble swallowing.   Respiratory: Positive for cough (productive at times).   Cardiovascular: Negative.   Gastrointestinal: Negative.   Neurological: Negative.   All other systems reviewed and are negative.       Objective:   Physical Exam  Constitutional: She is oriented to person, place, and time. She appears well-developed and well-nourished.  HENT:  Right Ear: Hearing, tympanic membrane, external ear and ear canal normal.  Left Ear: Hearing, tympanic membrane, external ear and ear canal normal.  Nose: Mucosal edema and rhinorrhea present. Right sinus exhibits maxillary sinus tenderness. Right sinus exhibits no frontal sinus tenderness. Left sinus exhibits maxillary sinus tenderness. Left sinus exhibits no frontal sinus tenderness.  Mouth/Throat: Uvula is midline and mucous membranes are normal.  Eyes: Conjunctivae are normal. Pupils are equal, round, and reactive to light.  Neck: Normal range of motion. Neck supple.  Cardiovascular: Normal rate, regular rhythm and normal heart sounds.   Pulmonary/Chest: Effort normal and breath sounds normal. No respiratory distress. She has no wheezes. She has no rales.  Dry loose cough  Lymphadenopathy:    She has no cervical adenopathy.  Neurological: She is alert and oriented to person, place, and time.  Skin: Skin is warm and dry.  Psychiatric: She has a normal mood and affect. Her behavior is normal. Judgment and thought content normal.   BP 150/78  Pulse 63  Temp(Src) 97.3 F (36.3 C) (Oral)  Ht 5' 4.5" (1.638 m)   Wt 207 lb (93.895 kg)  BMI 35.00 kg/m2  Results for orders placed in visit on 04/28/13  POCT RAPID STREP A (OFFICE)      Result Value Range   Rapid Strep A Screen Negative  Negative         Assessment & Plan:   1. Sore throat   2. Sinusitis, acute maxillary    Meds ordered this encounter  Medications  . azithromycin (ZITHROMAX Z-PAK) 250 MG tablet    Sig: As directed    Dispense:  6 each    Refill:  0    Order Specific Question:  Supervising Provider    Answer:  Ernestina PennaMOORE, DONALD W [[id number]]   1. Take meds as prescribed 2. Use a cool mist humidifier especially during the winter months and when heat has been humid. 3. Use saline nose sprays frequently 4. Saline irrigations of the nose can be very helpful if done frequently.  * 4X daily for 1 week*  * Use of a nettie pot can be helpful with this. Follow directions with this* 5. Drink plenty of fluids 6. Keep thermostat turn down low 7.For any cough or congestion  Use plain Mucinex- regular strength or max strength is fine   * Children- consult with Pharmacist for dosing 8. For fever or aces or pains- take tylenol or ibuprofen appropriate for age and weight.  * for fevers greater than 101 orally you may alternate ibuprofen and tylenol every  3 hours.   [name] Daphine DeutscherMartin, FNP

## 2013-05-01 ENCOUNTER — Ambulatory Visit (INDEPENDENT_AMBULATORY_CARE_PROVIDER_SITE_OTHER): Payer: Medicare Other | Admitting: *Deleted

## 2013-05-01 DIAGNOSIS — E538 Deficiency of other specified B group vitamins: Secondary | ICD-10-CM

## 2013-05-01 NOTE — Progress Notes (Signed)
Patient ID: [name], female   DOB: 1933-01-24, 80 y.o.   MRN: [id number] Pt tolerated inj well

## 2013-06-01 ENCOUNTER — Ambulatory Visit (INDEPENDENT_AMBULATORY_CARE_PROVIDER_SITE_OTHER): Payer: Medicare Other | Admitting: *Deleted

## 2013-06-01 DIAGNOSIS — E538 Deficiency of other specified B group vitamins: Secondary | ICD-10-CM

## 2013-06-01 NOTE — Patient Instructions (Signed)
Vitamin B12 Injections Every person needs vitamin B12. A deficiency develops when the body does not get enough of it. One way to overcome this is by getting B12 shots (injections). A B12 shot puts the vitamin directly into muscle tissue. This avoids any problems your body might have in absorbing it from food or a pill. In some people, the body has trouble using the vitamin correctly. This can cause a B12 deficiency. Not consuming enough of the vitamin can also cause a deficiency. Getting enough vitamin B12 can be hard for elderly people. Sometimes, they do not eat a well-balanced diet. The elderly are also more likely than younger people to have medical conditions or take medications that can lead to a deficiency. WHAT DOES VITAMIN B12 DO? Vitamin B12 does many things to help the body work right:  It helps the body make healthy red blood cells.  It helps maintain nerve cells.  It is involved in the body's process of converting food into energy (metabolism).  It is needed to make the genetic material in all cells (DNA). VITAMIN B12 FOOD SOURCES Most people get plenty of vitamin B12 through the foods they eat. It is present in:  Meat, fish, poultry, and eggs.  Milk and milk products.  It also is added when certain foods are made, including some breads, cereals and yogurts. The food is then called "fortified". CAUSES The most common causes of vitamin B12 deficiency are:  Pernicious anemia. The condition develops when the body cannot make enough healthy red blood cells. This stems from a lack of a protein made in the stomach (intrinsic factor). People without this protein cannot absorb enough vitamin B12 from food.  Malabsorption. This is when the body cannot absorb the vitamin. It can be caused by:  Pernicious anemia.  Surgery to remove part or all of the stomach can lead to malabsorption. Removal of part or all of the small intestine can also cause malabsorption.  Vegetarian diet.  People who are strict about not eating foods from animals could have trouble taking in enough vitamin B12 from diet alone.  Medications. Some medicines have been linked to B12 deficiency, such as Metformin (a drug prescribed for type 2 diabetes). Long-term use of stomach acid suppressants also can keep the vitamin from being absorbed.  Intestinal problems such as inflammatory bowel disease. If there are problems in the digestive tract, vitamin B12 may not be absorbed in good enough amounts. SYMPTOMS People who do not get enough B12 can develop problems. These can include:  Anemia. This is when the body has too few red blood cells. Red blood cells carry oxygen to the rest of the body. Without a healthy supply of red blood cells, people can feel:  Tired (fatigued).  Weak.  Severe anemia can cause:  Shortness of breath.  Dizziness.  Rapid heart rate.  Paleness.  Other Vitamin B12 deficiency symptoms include:  Diarrhea.  Numbness or tingling in the hands or feet.  Loss of appetite.  Confusion.  Sores on the tongue or in the mouth. LET YOUR CAREGIVER KNOW ABOUT:  Any allergies. It is very important to know if you are allergic or sensitive to cobalt. Vitamin B12 contains cobalt.  Any history of kidney disease.  All medications you are taking. Include prescription and over-the-counter medicines, herbs and creams.  Whether you are pregnant or breast-feeding.  If you have Leber's disease, a hereditary eye condition, vitamin B12 could make it worse. RISKS AND COMPLICATIONS Reactions to an injection are   usually temporary. They might include:  Pain at the injection site.  Redness, swelling or tenderness at the site.  Headache, dizziness or weakness.  Nausea, upset stomach or diarrhea.  Numbness or tingling.  Fever.  Joint pain.  Itching or rash. If a reaction does not go away in a short while, talk with your healthcare provider. A change in the way the shots are  given, or where they are given, might need to be made. BEFORE AN INJECTION To decide whether B12 injections are right for you, your healthcare provider will probably:  Ask about your medical history.  Ask questions about your diet.  Ask about symptoms such as:  Have you felt weak?  Do you feel unusually tired?  Do you get dizzy?  Order blood tests. These may include a test to:  Check the level of red cells in your blood.  Measure B12 levels.  Check for the presence of intrinsic factor. VITAMIN B12 INJECTIONS How often you will need a vitamin B12 injection will depend on how severe your deficiency is. This also will affect how long you will need to get them. People with pernicious anemia usually get injections for their entire life. Others might get them for a shorter period. For many people, injections are given daily or weekly for several weeks. Then, once B12 levels are normal, injections are given just once a month. If the cause of the deficiency can be fixed, the injections can be stopped. Talk with your healthcare provider about what you should expect. For an injection:  The injection site will be cleaned with an alcohol swab.  Your healthcare provider will insert a needle directly into a muscle. Most any muscle can be used. Most often, an arm muscle is used. A buttocks muscle can also be used. Many people say shots in that area are less painful.  A small adhesive bandage may be put over the injection site. It usually can be taken off in an hour or less. Injections can be given by your healthcare provider. In some cases, family members give them. Sometimes, people give them to themselves. Talk with your healthcare provider about what would be best for you. If someone other than your healthcare provider will be giving the shots, the person will need to be trained to give them correctly. HOME CARE INSTRUCTIONS   You can remove the adhesive bandage within an hour of getting a  shot.  You should be able to go about your normal activities right away.  Avoid drinking large amounts of alcohol while taking vitamin B12 shots. Alcohol can interfere with the body's use of the vitamin. SEEK MEDICAL CARE IF:   Pain, redness, swelling or tenderness at the injection site does not get better or gets worse.  Headache, dizziness or weakness does not go away.  You develop a fever of more than 100.5 F (38.1 C). SEEK IMMEDIATE MEDICAL CARE IF:   You have chest pain.  You develop shortness of breath.  You have muscle weakness that gets worse.  You develop numbness, weakness or tingling on one side or one area of the body.  You have symptoms of an allergic reaction, such as:  Hives.  Difficulty breathing.  Swelling of the lips, face, tongue or throat.  You develop a fever of more than 102.0 F (38.9 C). MAKE SURE YOU:   Understand these instructions.  Will watch your condition.  Will get help right away if you are not doing well or get worse. Document   Released: 06/15/2008 Document Revised: 06/11/2011 Document Reviewed: 06/15/2008 Exit[employer] Patient Information 2014 [employer].  

## 2013-06-01 NOTE — Progress Notes (Signed)
Vitamin B12 given and tolerated well.  

## 2013-06-23 ENCOUNTER — Ambulatory Visit (INDEPENDENT_AMBULATORY_CARE_PROVIDER_SITE_OTHER): Payer: Medicare Other | Admitting: Family Medicine

## 2013-06-23 ENCOUNTER — Encounter: Payer: Self-pay | Admitting: Family Medicine

## 2013-06-23 VITALS — BP 141/74 | HR 54 | Temp 97.9°F | Ht 64.5 in | Wt 210.0 lb

## 2013-06-23 DIAGNOSIS — E039 Hypothyroidism, unspecified: Secondary | ICD-10-CM

## 2013-06-23 DIAGNOSIS — D509 Iron deficiency anemia, unspecified: Secondary | ICD-10-CM

## 2013-06-23 DIAGNOSIS — E559 Vitamin D deficiency, unspecified: Secondary | ICD-10-CM

## 2013-06-23 DIAGNOSIS — Z780 Asymptomatic menopausal state: Secondary | ICD-10-CM

## 2013-06-23 DIAGNOSIS — I4891 Unspecified atrial fibrillation: Secondary | ICD-10-CM

## 2013-06-23 DIAGNOSIS — I10 Essential (primary) hypertension: Secondary | ICD-10-CM

## 2013-06-23 DIAGNOSIS — E785 Hyperlipidemia, unspecified: Secondary | ICD-10-CM

## 2013-06-23 DIAGNOSIS — E8881 Metabolic syndrome: Secondary | ICD-10-CM

## 2013-06-23 LAB — POCT CBC
Granulocyte percent: 65 %G (ref 37–80)
HCT, POC: 36.9 % — AB (ref 37.7–47.9)
HEMOGLOBIN: 12 g/dL — AB (ref 12.2–16.2)
Lymph, poc: 1.6 (ref 0.6–3.4)
MCH: 29.8 pg (ref 27–31.2)
MCHC: 32.4 g/dL (ref 31.8–35.4)
MCV: 91.8 fL (ref 80–97)
MPV: 8.7 fL (ref 0–99.8)
POC Granulocyte: 3.4 (ref 2–6.9)
POC LYMPH PERCENT: 30.4 %L (ref 10–50)
Platelet Count, POC: 173 10*3/uL (ref 142–424)
RBC: 4 M/uL — AB (ref 4.04–5.48)
RDW, POC: 14.7 %
WBC: 5.2 10*3/uL (ref 4.6–10.2)

## 2013-06-23 LAB — POCT GLYCOSYLATED HEMOGLOBIN (HGB A1C): Hemoglobin A1C: 6

## 2013-06-23 MED ORDER — LEVOTHYROXINE SODIUM 137 MCG PO TABS: 137.0000 ug | ORAL_TABLET | Freq: Every day | ORAL | Status: DC

## 2013-06-23 NOTE — Patient Instructions (Addendum)
Medicare Annual Wellness Visit  [facility] [place] and the medical providers at [facility] strive to bring you the best medical care.  In doing so we not only want to address your current medical conditions and concerns but also to detect new conditions early and prevent illness, disease and health-related problems.    Medicare offers a yearly Wellness Visit which allows our clinical staff to assess your need for preventative services including immunizations, lifestyle education, counseling to decrease risk of preventable diseases and screening for fall risk and other medical concerns.    This visit is provided free of charge (no copay) for all Medicare recipients. The clinical pharmacists at [facility] have begun to conduct these Wellness Visits which will also include a thorough review of all your medications.    As you primary medical provider recommend that you make an appointment for your Annual Wellness Visit if you have not done so already this year.  You may set up this appointment before you leave today or you may call back [phone] and schedule an appointment.  Please make sure when you call that you mention that you are scheduling your Annual Wellness Visit with the clinical pharmacist so that the appointment may be made for the proper length of time.     Continue current medications. Continue good therapeutic lifestyle changes which include good diet and exercise. Fall precautions discussed with patient. If an FOBT was given today- please return it to our front desk. If you are over 78 years old - you may need Prevnar 13 or the adult Pneumonia vaccine.  Continue to stay active,, and drink more water. We will call you with her lab results once those results are available You will be scheduled for a DEXA scan when he leaves the office today

## 2013-06-23 NOTE — Progress Notes (Signed)
Subjective:    Patient ID: [name], female    DOB: [birth year], 80 y.o.   MRN: [id number]  HPI Pt here for follow up and management of chronic medical problems. The patient is doing well with no particular complaints or problems. She will get her lab work done today. She is also to be scheduled for a DEXA scan.          Patient Active Problem List   Diagnosis Date Noted  . Anemia, iron deficiency 03/16/2013  . Vitamin D deficiency 03/16/2013  . HTN (hypertension) 03/16/2013  . hypothyroidism   . Urinary tract infection, site not specified   . Diaphragmatic hernia without mention of obstruction or gangrene   . Esophagitis   . hyperlipidemia   . Prolapse of vaginal walls without mention of uterine prolapse   . Arthritis   . First degree atrioventricular block   . Symptomatic menopausal or female climacteric states   . AF (atrial fibrillation)   . Rectal polyp   . Gastric polyp    Outpatient Encounter Prescriptions as of 06/23/2013  Medication Sig  . Cholecalciferol (VITAMIN D3) 2000 UNITS TABS Take 1 tablet by mouth daily.    . ferrous fumarate-iron polysaccharide complex (TANDEM) 162-115.2 MG CAPS Take 1 capsule by mouth daily with breakfast.  . furosemide (LASIX) 40 MG tablet Take 1 tablet (40 mg total) by mouth daily.  Marland Kitchen levothyroxine (SYNTHROID, LEVOTHROID) 137 MCG tablet Take 1 tablet (137 mcg total) by mouth daily. Mon,Wed,Fri.  . lisinopril (PRINIVIL,ZESTRIL) 10 MG tablet One tablet daily  . Multiple Vitamins-Minerals (CENTRUM SILVER) tablet Take 1 tablet by mouth daily.    . potassium chloride (KLOR-CON 10) 10 MEQ tablet Take 1 tablet (10 mEq total) by mouth daily.  . [DISCONTINUED] azithromycin (ZITHROMAX Z-PAK) 250 MG tablet As directed    Review of Systems  Constitutional: Negative.   HENT: Negative.   Eyes: Negative.   Respiratory: Negative.   Cardiovascular: Negative.   Gastrointestinal: Negative.   Endocrine: Negative.   Genitourinary: Negative.     Musculoskeletal: Negative.   Skin: Negative.   Allergic/Immunologic: Negative.   Neurological: Negative.   Hematological: Negative.   Psychiatric/Behavioral: Negative.        Objective:   Physical Exam  Nursing note and vitals reviewed. Constitutional: She is oriented to person, place, and time. She appears well-developed and well-nourished. No distress.  The patient is pleasant and cooperative. She indicates that she lost a sister in December. She is now retired from her work and living in a retirement condominium.  HENT:  Head: Normocephalic and atraumatic.  Right Ear: External ear normal.  Left Ear: External ear normal.  Nose: Nose normal.  Mouth/Throat: Oropharynx is clear and moist.  Eyes: Conjunctivae and EOM are normal. Pupils are equal, round, and reactive to light. Right eye exhibits no discharge. Left eye exhibits no discharge. No scleral icterus.  Neck: Normal range of motion. Neck supple. No thyromegaly present.  No carotid bruits  Cardiovascular: Normal rate, regular rhythm, normal heart sounds and intact distal pulses.  Exam reveals no gallop and no friction rub.   No murmur heard. The heart has a regular rate and rhythm at 60 per minute  Pulmonary/Chest: Effort normal and breath sounds normal. No respiratory distress. She has no wheezes. She has no rales. She exhibits no tenderness.  No axillary adenopathy  Abdominal: Soft. Bowel sounds are normal. She exhibits no mass. There is tenderness. There is no rebound and no guarding.  The  abdomen is obese with slight epigastric tenderness and no inguinal adenopathy  Musculoskeletal: Normal range of motion. She exhibits no edema and no tenderness.  Lymphadenopathy:    She has no cervical adenopathy.  Neurological: She is alert and oriented to person, place, and time. She has normal reflexes. No cranial nerve deficit.  Skin: Skin is warm and dry.  Psychiatric: She has a normal mood and affect. Her behavior is normal.  Judgment and thought content normal.   BP 141/74  Pulse 54  Temp(Src) 97.9 F (36.6 C) (Oral)  Ht 5' 4.5" (1.638 m)  Wt 210 lb (95.255 kg)  BMI 35.50 kg/m2        Assessment & Plan:  1. AF (atrial fibrillation) - POCT CBC  2. Anemia, iron deficiency - POCT CBC  3. HTN (hypertension) - BMP8+EGFR - Hepatic function panel  4. hyperlipidemia - NMR, lipoprofile  5. hypothyroidism - Thyroid Panel With TSH  6. Vitamin D deficiency - POCT CBC - Vit D  25 hydroxy (rtn osteoporosis monitoring) - DG Bone Density; Future  7. Metabolic syndrome - POCT CBC - POCT glycosylated hemoglobin (Hb A1C)  8. Postmenopausal - DG Bone Density; Future  Meds ordered this encounter  Medications  . levothyroxine (SYNTHROID, LEVOTHROID) 137 MCG tablet    Sig: Take 1 tablet (137 mcg total) by mouth daily. Mon,Wed,Fri.    Dispense:  30 tablet    Refill:  2   Patient Instructions                       Medicare Annual Wellness Visit  [facility] and the medical providers at [facility] strive to bring you the best medical care.  In doing so we not only want to address your current medical conditions and concerns but also to detect new conditions early and prevent illness, disease and health-related problems.    Medicare offers a yearly Wellness Visit which allows our clinical staff to assess your need for preventative services including immunizations, lifestyle education, counseling to decrease risk of preventable diseases and screening for fall risk and other medical concerns.    This visit is provided free of charge (no copay) for all Medicare recipients. The clinical pharmacists at [facility] have begun to conduct these Wellness Visits which will also include a thorough review of all your medications.    As you primary medical provider recommend that you make an appointment for your Annual Wellness Visit if you have not done so already this  year.  You may set up this appointment before you leave today or you may call back [phone] and schedule an appointment.  Please make sure when you call that you mention that you are scheduling your Annual Wellness Visit with the clinical pharmacist so that the appointment may be made for the proper length of time.     Continue current medications. Continue good therapeutic lifestyle changes which include good diet and exercise. Fall precautions discussed with patient. If an FOBT was given today- please return it to our front desk. If you are over 71 years old - you may need Prevnar 59 or the adult Pneumonia vaccine.  Continue to stay active,, and drink more water. We will call you with her lab results once those results are available You will be scheduled for a DEXA scan when he leaves the office today   Arrie Senate MD

## 2013-06-25 LAB — VITAMIN D 25 HYDROXY (VIT D DEFICIENCY, FRACTURES): VIT D 25 HYDROXY: 34.8 ng/mL (ref 30.0–100.0)

## 2013-06-25 LAB — BMP8+EGFR
BUN / CREAT RATIO: 18 (ref 11–26)
BUN: 17 mg/dL (ref 8–27)
CO2: 30 mmol/L — ABNORMAL HIGH (ref 18–29)
CREATININE: 0.95 mg/dL (ref 0.57–1.00)
Calcium: 9.9 mg/dL (ref 8.7–10.3)
Chloride: 96 mmol/L — ABNORMAL LOW (ref 97–108)
GFR calc non Af Amer: 57 mL/min/{1.73_m2} — ABNORMAL LOW (ref >59)
GFR, EST AFRICAN AMERICAN: 66 mL/min/{1.73_m2} (ref >59)
Glucose: 90 mg/dL (ref 65–99)
Potassium: 4.3 mmol/L (ref 3.5–5.2)
Sodium: 142 mmol/L (ref 134–144)

## 2013-06-25 LAB — NMR, LIPOPROFILE
Cholesterol: 214 mg/dL — ABNORMAL HIGH (ref <200)
HDL CHOLESTEROL BY NMR: 57 mg/dL (ref >=40)
HDL Particle Number: 40.4 umol/L (ref >=30.5)
LDL Particle Number: 1621 nmol/L — ABNORMAL HIGH (ref <1000)
LDL Size: 20.6 nm (ref >20.5)
LDLC SERPL CALC-MCNC: 115 mg/dL — ABNORMAL HIGH (ref <100)
LP-IR SCORE: 59 — AB (ref <=45)
SMALL LDL PARTICLE NUMBER: 814 nmol/L — AB (ref <=527)
Triglycerides by NMR: 210 mg/dL — ABNORMAL HIGH (ref <150)

## 2013-06-25 LAB — THYROID PANEL WITH TSH
FREE THYROXINE INDEX: 2.5 (ref 1.2–4.9)
T3 Uptake Ratio: 28 % (ref 24–39)
T4, Total: 9.1 ug/dL (ref 4.5–12.0)
TSH: 3.6 u[IU]/mL (ref 0.450–4.500)

## 2013-06-25 LAB — HEPATIC FUNCTION PANEL
ALK PHOS: 92 IU/L (ref 39–117)
ALT: 15 IU/L (ref 0–32)
AST: 22 IU/L (ref 0–40)
Albumin: 4.6 g/dL (ref 3.5–4.8)
BILIRUBIN TOTAL: 0.6 mg/dL (ref 0.0–1.2)
Bilirubin, Direct: 0.15 mg/dL (ref 0.00–0.40)
Total Protein: 7.2 g/dL (ref 6.0–8.5)

## 2013-07-01 ENCOUNTER — Encounter: Payer: Self-pay | Admitting: *Deleted

## 2013-07-06 ENCOUNTER — Ambulatory Visit (INDEPENDENT_AMBULATORY_CARE_PROVIDER_SITE_OTHER): Payer: Medicare Other | Admitting: *Deleted

## 2013-07-06 DIAGNOSIS — E538 Deficiency of other specified B group vitamins: Secondary | ICD-10-CM

## 2013-07-06 NOTE — Progress Notes (Signed)
Vitamin b12 given and tolerated well. 

## 2013-07-06 NOTE — Patient Instructions (Signed)
Vitamin B12 Injections Every person needs vitamin B12. A deficiency develops when the body does not get enough of it. One way to overcome this is by getting B12 shots (injections). A B12 shot puts the vitamin directly into muscle tissue. This avoids any problems your body might have in absorbing it from food or a pill. In some people, the body has trouble using the vitamin correctly. This can cause a B12 deficiency. Not consuming enough of the vitamin can also cause a deficiency. Getting enough vitamin B12 can be hard for elderly people. Sometimes, they do not eat a well-balanced diet. The elderly are also more likely than younger people to have medical conditions or take medications that can lead to a deficiency. WHAT DOES VITAMIN B12 DO? Vitamin B12 does many things to help the body work right:  It helps the body make healthy red blood cells.  It helps maintain nerve cells.  It is involved in the body's process of converting food into energy (metabolism).  It is needed to make the genetic material in all cells (DNA). VITAMIN B12 FOOD SOURCES Most people get plenty of vitamin B12 through the foods they eat. It is present in:  Meat, fish, poultry, and eggs.  Milk and milk products.  It also is added when certain foods are made, including some breads, cereals and yogurts. The food is then called "fortified". CAUSES The most common causes of vitamin B12 deficiency are:  Pernicious anemia. The condition develops when the body cannot make enough healthy red blood cells. This stems from a lack of a protein made in the stomach (intrinsic factor). People without this protein cannot absorb enough vitamin B12 from food.  Malabsorption. This is when the body cannot absorb the vitamin. It can be caused by:  Pernicious anemia.  Surgery to remove part or all of the stomach can lead to malabsorption. Removal of part or all of the small intestine can also cause malabsorption.  Vegetarian diet.  People who are strict about not eating foods from animals could have trouble taking in enough vitamin B12 from diet alone.  Medications. Some medicines have been linked to B12 deficiency, such as Metformin (a drug prescribed for type 2 diabetes). Long-term use of stomach acid suppressants also can keep the vitamin from being absorbed.  Intestinal problems such as inflammatory bowel disease. If there are problems in the digestive tract, vitamin B12 may not be absorbed in good enough amounts. SYMPTOMS People who do not get enough B12 can develop problems. These can include:  Anemia. This is when the body has too few red blood cells. Red blood cells carry oxygen to the rest of the body. Without a healthy supply of red blood cells, people can feel:  Tired (fatigued).  Weak.  Severe anemia can cause:  Shortness of breath.  Dizziness.  Rapid heart rate.  Paleness.  Other Vitamin B12 deficiency symptoms include:  Diarrhea.  Numbness or tingling in the hands or feet.  Loss of appetite.  Confusion.  Sores on the tongue or in the mouth. LET YOUR CAREGIVER KNOW ABOUT:  Any allergies. It is very important to know if you are allergic or sensitive to cobalt. Vitamin B12 contains cobalt.  Any history of kidney disease.  All medications you are taking. Include prescription and over-the-counter medicines, herbs and creams.  Whether you are pregnant or breast-feeding.  If you have Leber's disease, a hereditary eye condition, vitamin B12 could make it worse. RISKS AND COMPLICATIONS Reactions to an injection are   usually temporary. They might include:  Pain at the injection site.  Redness, swelling or tenderness at the site.  Headache, dizziness or weakness.  Nausea, upset stomach or diarrhea.  Numbness or tingling.  Fever.  Joint pain.  Itching or rash. If a reaction does not go away in a short while, talk with your healthcare provider. A change in the way the shots are  given, or where they are given, might need to be made. BEFORE AN INJECTION To decide whether B12 injections are right for you, your healthcare provider will probably:  Ask about your medical history.  Ask questions about your diet.  Ask about symptoms such as:  Have you felt weak?  Do you feel unusually tired?  Do you get dizzy?  Order blood tests. These may include a test to:  Check the level of red cells in your blood.  Measure B12 levels.  Check for the presence of intrinsic factor. VITAMIN B12 INJECTIONS How often you will need a vitamin B12 injection will depend on how severe your deficiency is. This also will affect how long you will need to get them. People with pernicious anemia usually get injections for their entire life. Others might get them for a shorter period. For many people, injections are given daily or weekly for several weeks. Then, once B12 levels are normal, injections are given just once a month. If the cause of the deficiency can be fixed, the injections can be stopped. Talk with your healthcare provider about what you should expect. For an injection:  The injection site will be cleaned with an alcohol swab.  Your healthcare provider will insert a needle directly into a muscle. Most any muscle can be used. Most often, an arm muscle is used. A buttocks muscle can also be used. Many people say shots in that area are less painful.  A small adhesive bandage may be put over the injection site. It usually can be taken off in an hour or less. Injections can be given by your healthcare provider. In some cases, family members give them. Sometimes, people give them to themselves. Talk with your healthcare provider about what would be best for you. If someone other than your healthcare provider will be giving the shots, the person will need to be trained to give them correctly. HOME CARE INSTRUCTIONS   You can remove the adhesive bandage within an hour of getting a  shot.  You should be able to go about your normal activities right away.  Avoid drinking large amounts of alcohol while taking vitamin B12 shots. Alcohol can interfere with the body's use of the vitamin. SEEK MEDICAL CARE IF:   Pain, redness, swelling or tenderness at the injection site does not get better or gets worse.  Headache, dizziness or weakness does not go away.  You develop a fever of more than 100.5 F (38.1 C). SEEK IMMEDIATE MEDICAL CARE IF:   You have chest pain.  You develop shortness of breath.  You have muscle weakness that gets worse.  You develop numbness, weakness or tingling on one side or one area of the body.  You have symptoms of an allergic reaction, such as:  Hives.  Difficulty breathing.  Swelling of the lips, face, tongue or throat.  You develop a fever of more than 102.0 F (38.9 C). MAKE SURE YOU:   Understand these instructions.  Will watch your condition.  Will get help right away if you are not doing well or get worse. Document   Released: 06/15/2008 Document Revised: 06/11/2011 Document Reviewed: 06/15/2008 Exit[employer] Patient Information 2014 [employer].  

## 2013-07-20 ENCOUNTER — Encounter: Payer: Self-pay | Admitting: *Deleted

## 2013-07-23 ENCOUNTER — Telehealth: Payer: Self-pay | Admitting: Family Medicine

## 2013-07-23 MED ORDER — FUROSEMIDE 40 MG PO TABS: 40.0000 mg | ORAL_TABLET | Freq: Every day | ORAL | Status: DC

## 2013-07-23 MED ORDER — POTASSIUM CHLORIDE ER 10 MEQ PO TBCR: 10.0000 meq | EXTENDED_RELEASE_TABLET | Freq: Every day | ORAL | Status: DC

## 2013-07-23 NOTE — Telephone Encounter (Signed)
This has been done and pt aware to pick up on Monday after dwm signs them

## 2013-07-23 NOTE — Telephone Encounter (Signed)
This patient is seen by Dr. Christell ConstantMoore

## 2013-08-06 ENCOUNTER — Ambulatory Visit (INDEPENDENT_AMBULATORY_CARE_PROVIDER_SITE_OTHER): Payer: Medicare Other | Admitting: *Deleted

## 2013-08-06 DIAGNOSIS — E538 Deficiency of other specified B group vitamins: Secondary | ICD-10-CM

## 2013-08-06 NOTE — Patient Instructions (Signed)

## 2013-08-06 NOTE — Progress Notes (Signed)
Vitamin b12 given and tolerated well. 

## 2013-08-12 ENCOUNTER — Ambulatory Visit (INDEPENDENT_AMBULATORY_CARE_PROVIDER_SITE_OTHER): Payer: Medicare Other | Admitting: Pharmacist

## 2013-08-12 ENCOUNTER — Ambulatory Visit (INDEPENDENT_AMBULATORY_CARE_PROVIDER_SITE_OTHER): Payer: Medicare Other

## 2013-08-12 ENCOUNTER — Encounter: Payer: Self-pay | Admitting: Pharmacist

## 2013-08-12 VITALS — Ht 64.0 in | Wt 216.0 lb

## 2013-08-12 DIAGNOSIS — E559 Vitamin D deficiency, unspecified: Secondary | ICD-10-CM

## 2013-08-12 DIAGNOSIS — Z780 Asymptomatic menopausal state: Secondary | ICD-10-CM

## 2013-08-12 DIAGNOSIS — M810 Age-related osteoporosis without current pathological fracture: Secondary | ICD-10-CM | POA: Insufficient documentation

## 2013-08-12 LAB — HM DEXA SCAN

## 2013-08-12 MED ORDER — ALENDRONATE SODIUM 70 MG PO TABS: 70.0000 mg | ORAL_TABLET | ORAL | Status: DC

## 2013-08-12 NOTE — Patient Instructions (Signed)
Separate vitamins (iron, calcium and multivitamin) from synthroid / levothyroxine by at least 1 hour.   AM - take synthroid  Lunch - Calcium  Supper - MVI, vitamin D  Bedtime - Iron, potassium   Fall Prevention and Home Safety Falls cause injuries and can affect all age groups. It is possible to use preventive measures to significantly decrease the likelihood of falls. There are many simple measures which can make your home safer and prevent falls. OUTDOORS  Repair cracks and edges of walkways and driveways.  Remove high doorway thresholds.  Trim shrubbery on the main path into your home.  Have good outside lighting.  Clear walkways of tools, rocks, debris, and clutter.  Check that handrails are not broken and are securely fastened. Both sides of steps should have handrails.  Have leaves, snow, and ice cleared regularly.  Use sand or salt on walkways during winter months.  In the garage, clean up grease or oil spills. BATHROOM  Install night lights.  Install grab bars by the toilet and in the tub and shower.  Use non-skid mats or decals in the tub or shower.  Place a plastic non-slip stool in the shower to sit on, if needed.  Keep floors dry and clean up all water on the floor immediately.  Remove soap buildup in the tub or shower on a regular basis.  Secure bath mats with non-slip, double-sided rug tape.  Remove throw rugs and tripping hazards from the floors. BEDROOMS  Install night lights.  Make sure a bedside light is easy to reach.  Do not use oversized bedding.  Keep a telephone by your bedside.  Have a firm chair with side arms to use for getting dressed.  Remove throw rugs and tripping hazards from the floor. KITCHEN  Keep handles on pots and pans turned toward the center of the stove. Use back burners when possible.  Clean up spills quickly and allow time for drying.  Avoid walking on wet floors.  Avoid hot utensils and knives.  Position  shelves so they are not too high or low.  Place commonly used objects within easy reach.  If necessary, use a sturdy step stool with a grab bar when reaching.  Keep electrical cables out of the way.  Do not use floor polish or wax that makes floors slippery. If you must use wax, use non-skid floor wax.  Remove throw rugs and tripping hazards from the floor. STAIRWAYS  Never leave objects on stairs.  Place handrails on both sides of stairways and use them. Fix any loose handrails. Make sure handrails on both sides of the stairways are as long as the stairs.  Check carpeting to make sure it is firmly attached along stairs. Make repairs to worn or loose carpet promptly.  Avoid placing throw rugs at the top or bottom of stairways, or properly secure the rug with carpet tape to prevent slippage. Get rid of throw rugs, if possible.  Have an electrician put in a light switch at the top and bottom of the stairs. OTHER FALL PREVENTION TIPS  Wear low-heel or rubber-soled shoes that are supportive and fit well. Wear closed toe shoes.  When using a stepladder, make sure it is fully opened and both spreaders are firmly locked. Do not climb a closed stepladder.  Add color or contrast paint or tape to grab bars and handrails in your home. Place contrasting color strips on first and last steps.  Learn and use mobility aids as needed. Install  an electrical emergency response system.  Turn on lights to avoid dark areas. Replace light bulbs that burn out immediately. Get light switches that glow.  Arrange furniture to create clear pathways. Keep furniture in the same place.  Firmly attach carpet with non-skid or double-sided tape.  Eliminate uneven floor surfaces.  Select a carpet pattern that does not visually hide the edge of steps.  Be aware of all pets. OTHER HOME SAFETY TIPS  Set the water temperature for 120 F (48.8 C).  Keep emergency numbers on or near the telephone.  Keep  smoke detectors on every level of the home and near sleeping areas. Document Released: 03/09/2002 Document Revised: 09/18/2011 Document Reviewed: 06/08/2011 [facility] Patient Information 2014 [place], [place].

## 2013-08-12 NOTE — Progress Notes (Signed)
Patient ID: [name], female   DOB: 1933/01/17, 80 y.o.   MRN: [id number]  [facility] Current Height: Height: 5\' 4"  (162.6 cm)      Max Lifetime Height:  5'5" Current Weight: Weight: 216 lb (97.977 kg)       Ethnicity:Caucasian    HPI: Patient with osteopenia not currently on mediation therapy to treat.  Back Pain?  Yes       Kyphosis?  No Prior fracture?  Yes - shoulder and 3 ribs last year after fall Med(s) for Osteoporosis/Osteopenia:  None currently  Med(s) previously tried for Osteoporosis/Osteopenia:  Fosamax but patient cannot remember why she did not continue                                                             PMH: Age at menopause:  80 yo Hysterectomy?  No Oophorectomy?  No HRT? Yes - Former.  Type/duration: prempro Steroid Use?  No Thyroid med?  Yes History of cancer?  No History of digestive disorders (ie Crohn's)?  Yes - hiatal hernia Current or previous eating disorders?  No Last Vitamin D Result:  34.8 (06/23/2013) Last GFR Result:  57 (06/23/2013)   FH/SH: Family history of osteoporosis?  No Parent with history of hip fracture?  No Family history of breast cancer?  Yes - daughter Exercise?  Yes - walking a little Smoking?  No Alcohol?  No    Calcium Assessment Calcium Intake  # of servings/day  Calcium mg  Milk (8 oz) 0  x  300  = 0  Yogurt (4 oz) 0 x  200 = 0  Cheese (1 oz) 1 x  200 = 200mg   Other Calcium sources   250mg   Ca supplement Calcium 500mg  qd and MVI qd = 900mg    Estimated calcium intake per day 1350mg     DEXA Results Date of Test T-Score for AP Spine L1-L4 T-Score for Total Left Hip T-Score for Total Right Hip  08/12/2013 -0.6 -1.2 -1.7  05/09/2011 -0.8 -1.0 -1.4  03/24/2008 -0.5 -0.8 -0.9  03/18/2006 -0.5 -0.9 -1.0   Assessment: Osteoporosis - worsening BMD with non compliance to previous prescribed treatment (2013)  Recommendations: 1.  Start  alendronate (FOSAMAX) 70mg  1 tablet weekly - take on empty stomach  with full glass of water.  Do not eat or take other medications for 30 minutes after alendronate.  2.  continue calcium 1200mg  daily through supplementation or diet. Discussed way to take vitamins and synthroid to optimize absorption and minimize side effects.  AM - synthroid  Lunch - Calcium  Supper - MVI and vitamin D  Bedtime - Iron and Potassium 3.  recommend weight bearing exercise - 30 minutes at least 4 days per week.   4.  Counseled and educated about fall risk and prevention.  Recheck DEXA:  2 years  Time spent counseling patient:  30 minutes  Henrene Pastorammy Jahanna Raether, PharmD, CPP

## 2013-08-19 ENCOUNTER — Ambulatory Visit: Payer: Medicare Other

## 2013-08-19 ENCOUNTER — Other Ambulatory Visit: Payer: Medicare Other

## 2013-09-08 ENCOUNTER — Ambulatory Visit (INDEPENDENT_AMBULATORY_CARE_PROVIDER_SITE_OTHER): Payer: Medicare Other | Admitting: *Deleted

## 2013-09-08 DIAGNOSIS — E538 Deficiency of other specified B group vitamins: Secondary | ICD-10-CM

## 2013-09-08 NOTE — Patient Instructions (Signed)
Vitamin B12 Injections Every person needs vitamin B12. A deficiency develops when the body does not get enough of it. One way to overcome this is by getting B12 shots (injections). A B12 shot puts the vitamin directly into muscle tissue. This avoids any problems your body might have in absorbing it from food or a pill. In some people, the body has trouble using the vitamin correctly. This can cause a B12 deficiency. Not consuming enough of the vitamin can also cause a deficiency. Getting enough vitamin B12 can be hard for elderly people. Sometimes, they do not eat a well-balanced diet. The elderly are also more likely than younger people to have medical conditions or take medications that can lead to a deficiency. WHAT DOES VITAMIN B12 DO? Vitamin B12 does many things to help the body work right:  It helps the body make healthy red blood cells.  It helps maintain nerve cells.  It is involved in the body's process of converting food into energy (metabolism).  It is needed to make the genetic material in all cells (DNA). VITAMIN B12 FOOD SOURCES Most people get plenty of vitamin B12 through the foods they eat. It is present in:  Meat, fish, poultry, and eggs.  Milk and milk products.  It also is added when certain foods are made, including some breads, cereals and yogurts. The food is then called "fortified". CAUSES The most common causes of vitamin B12 deficiency are:  Pernicious anemia. The condition develops when the body cannot make enough healthy red blood cells. This stems from a lack of a protein made in the stomach (intrinsic factor). People without this protein cannot absorb enough vitamin B12 from food.  Malabsorption. This is when the body cannot absorb the vitamin. It can be caused by:  Pernicious anemia.  Surgery to remove part or all of the stomach can lead to malabsorption. Removal of part or all of the small intestine can also cause malabsorption.  Vegetarian diet.  People who are strict about not eating foods from animals could have trouble taking in enough vitamin B12 from diet alone.  Medications. Some medicines have been linked to B12 deficiency, such as Metformin (a drug prescribed for type 2 diabetes). Long-term use of stomach acid suppressants also can keep the vitamin from being absorbed.  Intestinal problems such as inflammatory bowel disease. If there are problems in the digestive tract, vitamin B12 may not be absorbed in good enough amounts. SYMPTOMS People who do not get enough B12 can develop problems. These can include:  Anemia. This is when the body has too few red blood cells. Red blood cells carry oxygen to the rest of the body. Without a healthy supply of red blood cells, people can feel:  Tired (fatigued).  Weak.  Severe anemia can cause:  Shortness of breath.  Dizziness.  Rapid heart rate.  Paleness.  Other Vitamin B12 deficiency symptoms include:  Diarrhea.  Numbness or tingling in the hands or feet.  Loss of appetite.  Confusion.  Sores on the tongue or in the mouth. LET YOUR CAREGIVER KNOW ABOUT:  Any allergies. It is very important to know if you are allergic or sensitive to cobalt. Vitamin B12 contains cobalt.  Any history of kidney disease.  All medications you are taking. Include prescription and over-the-counter medicines, herbs and creams.  Whether you are pregnant or breast-feeding.  If you have Leber's disease, a hereditary eye condition, vitamin B12 could make it worse. RISKS AND COMPLICATIONS Reactions to an injection are   usually temporary. They might include:  Pain at the injection site.  Redness, swelling or tenderness at the site.  Headache, dizziness or weakness.  Nausea, upset stomach or diarrhea.  Numbness or tingling.  Fever.  Joint pain.  Itching or rash. If a reaction does not go away in a short while, talk with your healthcare provider. A change in the way the shots are  given, or where they are given, might need to be made. BEFORE AN INJECTION To decide whether B12 injections are right for you, your healthcare provider will probably:  Ask about your medical history.  Ask questions about your diet.  Ask about symptoms such as:  Have you felt weak?  Do you feel unusually tired?  Do you get dizzy?  Order blood tests. These may include a test to:  Check the level of red cells in your blood.  Measure B12 levels.  Check for the presence of intrinsic factor. VITAMIN B12 INJECTIONS How often you will need a vitamin B12 injection will depend on how severe your deficiency is. This also will affect how long you will need to get them. People with pernicious anemia usually get injections for their entire life. Others might get them for a shorter period. For many people, injections are given daily or weekly for several weeks. Then, once B12 levels are normal, injections are given just once a month. If the cause of the deficiency can be fixed, the injections can be stopped. Talk with your healthcare provider about what you should expect. For an injection:  The injection site will be cleaned with an alcohol swab.  Your healthcare provider will insert a needle directly into a muscle. Most any muscle can be used. Most often, an arm muscle is used. A buttocks muscle can also be used. Many people say shots in that area are less painful.  A small adhesive bandage may be put over the injection site. It usually can be taken off in an hour or less. Injections can be given by your healthcare provider. In some cases, family members give them. Sometimes, people give them to themselves. Talk with your healthcare provider about what would be best for you. If someone other than your healthcare provider will be giving the shots, the person will need to be trained to give them correctly. HOME CARE INSTRUCTIONS   You can remove the adhesive bandage within an hour of getting a  shot.  You should be able to go about your normal activities right away.  Avoid drinking large amounts of alcohol while taking vitamin B12 shots. Alcohol can interfere with the body's use of the vitamin. SEEK MEDICAL CARE IF:   Pain, redness, swelling or tenderness at the injection site does not get better or gets worse.  Headache, dizziness or weakness does not go away.  You develop a fever of more than 100.5 F (38.1 C). SEEK IMMEDIATE MEDICAL CARE IF:   You have chest pain.  You develop shortness of breath.  You have muscle weakness that gets worse.  You develop numbness, weakness or tingling on one side or one area of the body.  You have symptoms of an allergic reaction, such as:  Hives.  Difficulty breathing.  Swelling of the lips, face, tongue or throat.  You develop a fever of more than 102.0 F (38.9 C). MAKE SURE YOU:   Understand these instructions.  Will watch your condition.  Will get help right away if you are not doing well or get worse. Document   Released: 06/15/2008 Document Revised: 06/11/2011 Document Reviewed: 06/15/2008 Exit[employer] Patient Information 2014 [employer].  

## 2013-09-08 NOTE — Progress Notes (Signed)
Vitamin b12 given and tolerated well. 

## 2013-10-09 ENCOUNTER — Ambulatory Visit (INDEPENDENT_AMBULATORY_CARE_PROVIDER_SITE_OTHER): Payer: Medicare Other | Admitting: *Deleted

## 2013-10-09 DIAGNOSIS — E538 Deficiency of other specified B group vitamins: Secondary | ICD-10-CM

## 2013-10-09 NOTE — Patient Instructions (Signed)
Vitamin B12 Injections Every person needs vitamin B12. A deficiency develops when the body does not get enough of it. One way to overcome this is by getting B12 shots (injections). A B12 shot puts the vitamin directly into muscle tissue. This avoids any problems your body might have in absorbing it from food or a pill. In some people, the body has trouble using the vitamin correctly. This can cause a B12 deficiency. Not consuming enough of the vitamin can also cause a deficiency. Getting enough vitamin B12 can be hard for elderly people. Sometimes, they do not eat a well-balanced diet. The elderly are also more likely than younger people to have medical conditions or take medications that can lead to a deficiency. WHAT DOES VITAMIN B12 DO? Vitamin B12 does many things to help the body work right:  It helps the body make healthy red blood cells.  It helps maintain nerve cells.  It is involved in the body's process of converting food into energy (metabolism).  It is needed to make the genetic material in all cells (DNA). VITAMIN B12 FOOD SOURCES Most people get plenty of vitamin B12 through the foods they eat. It is present in:  Meat, fish, poultry, and eggs.  Milk and milk products.  It also is added when certain foods are made, including some breads, cereals and yogurts. The food is then called "fortified". CAUSES The most common causes of vitamin B12 deficiency are:  Pernicious anemia. The condition develops when the body cannot make enough healthy red blood cells. This stems from a lack of a protein made in the stomach (intrinsic factor). People without this protein cannot absorb enough vitamin B12 from food.  Malabsorption. This is when the body cannot absorb the vitamin. It can be caused by:  Pernicious anemia.  Surgery to remove part or all of the stomach can lead to malabsorption. Removal of part or all of the small intestine can also cause malabsorption.  Vegetarian diet.  People who are strict about not eating foods from animals could have trouble taking in enough vitamin B12 from diet alone.  Medications. Some medicines have been linked to B12 deficiency, such as Metformin (a drug prescribed for type 2 diabetes). Long-term use of stomach acid suppressants also can keep the vitamin from being absorbed.  Intestinal problems such as inflammatory bowel disease. If there are problems in the digestive tract, vitamin B12 may not be absorbed in good enough amounts. SYMPTOMS People who do not get enough B12 can develop problems. These can include:  Anemia. This is when the body has too few red blood cells. Red blood cells carry oxygen to the rest of the body. Without a healthy supply of red blood cells, people can feel:  Tired (fatigued).  Weak.  Severe anemia can cause:  Shortness of breath.  Dizziness.  Rapid heart rate.  Paleness.  Other Vitamin B12 deficiency symptoms include:  Diarrhea.  Numbness or tingling in the hands or feet.  Loss of appetite.  Confusion.  Sores on the tongue or in the mouth. LET YOUR CAREGIVER KNOW ABOUT:  Any allergies. It is very important to know if you are allergic or sensitive to cobalt. Vitamin B12 contains cobalt.  Any history of kidney disease.  All medications you are taking. Include prescription and over-the-counter medicines, herbs and creams.  Whether you are pregnant or breast-feeding.  If you have Leber's disease, a hereditary eye condition, vitamin B12 could make it worse. RISKS AND COMPLICATIONS Reactions to an injection are   usually temporary. They might include:  Pain at the injection site.  Redness, swelling or tenderness at the site.  Headache, dizziness or weakness.  Nausea, upset stomach or diarrhea.  Numbness or tingling.  Fever.  Joint pain.  Itching or rash. If a reaction does not go away in a short while, talk with your healthcare provider. A change in the way the shots are  given, or where they are given, might need to be made. BEFORE AN INJECTION To decide whether B12 injections are right for you, your healthcare provider will probably:  Ask about your medical history.  Ask questions about your diet.  Ask about symptoms such as:  Have you felt weak?  Do you feel unusually tired?  Do you get dizzy?  Order blood tests. These may include a test to:  Check the level of red cells in your blood.  Measure B12 levels.  Check for the presence of intrinsic factor. VITAMIN B12 INJECTIONS How often you will need a vitamin B12 injection will depend on how severe your deficiency is. This also will affect how long you will need to get them. People with pernicious anemia usually get injections for their entire life. Others might get them for a shorter period. For many people, injections are given daily or weekly for several weeks. Then, once B12 levels are normal, injections are given just once a month. If the cause of the deficiency can be fixed, the injections can be stopped. Talk with your healthcare provider about what you should expect. For an injection:  The injection site will be cleaned with an alcohol swab.  Your healthcare provider will insert a needle directly into a muscle. Most any muscle can be used. Most often, an arm muscle is used. A buttocks muscle can also be used. Many people say shots in that area are less painful.  A small adhesive bandage may be put over the injection site. It usually can be taken off in an hour or less. Injections can be given by your healthcare provider. In some cases, family members give them. Sometimes, people give them to themselves. Talk with your healthcare provider about what would be best for you. If someone other than your healthcare provider will be giving the shots, the person will need to be trained to give them correctly. HOME CARE INSTRUCTIONS   You can remove the adhesive bandage within an hour of getting a  shot.  You should be able to go about your normal activities right away.  Avoid drinking large amounts of alcohol while taking vitamin B12 shots. Alcohol can interfere with the body's use of the vitamin. SEEK MEDICAL CARE IF:   Pain, redness, swelling or tenderness at the injection site does not get better or gets worse.  Headache, dizziness or weakness does not go away.  You develop a fever of more than 100.5 F (38.1 C). SEEK IMMEDIATE MEDICAL CARE IF:   You have chest pain.  You develop shortness of breath.  You have muscle weakness that gets worse.  You develop numbness, weakness or tingling on one side or one area of the body.  You have symptoms of an allergic reaction, such as:  Hives.  Difficulty breathing.  Swelling of the lips, face, tongue or throat.  You develop a fever of more than 102.0 F (38.9 C). MAKE SURE YOU:   Understand these instructions.  Will watch your condition.  Will get help right away if you are not doing well or get worse. Document   Released: 06/15/2008 Document Revised: 06/11/2011 Document Reviewed: 06/15/2008 Exit[employer] Patient Information 2015 [employer]. This information is not intended to replace advice given to you by your health care provider. Make sure you discuss any questions you have with your health care provider.  

## 2013-10-09 NOTE — Progress Notes (Signed)
Vitamin b12 given and tolerated well. 

## 2013-10-26 ENCOUNTER — Ambulatory Visit (INDEPENDENT_AMBULATORY_CARE_PROVIDER_SITE_OTHER): Payer: Medicare Other | Admitting: Family Medicine

## 2013-10-26 ENCOUNTER — Encounter: Payer: Self-pay | Admitting: Family Medicine

## 2013-10-26 VITALS — BP 136/62 | HR 70 | Temp 99.0°F | Ht 64.0 in | Wt 215.0 lb

## 2013-10-26 DIAGNOSIS — I10 Essential (primary) hypertension: Secondary | ICD-10-CM

## 2013-10-26 DIAGNOSIS — E039 Hypothyroidism, unspecified: Secondary | ICD-10-CM

## 2013-10-26 DIAGNOSIS — D509 Iron deficiency anemia, unspecified: Secondary | ICD-10-CM

## 2013-10-26 DIAGNOSIS — E785 Hyperlipidemia, unspecified: Secondary | ICD-10-CM

## 2013-10-26 DIAGNOSIS — E559 Vitamin D deficiency, unspecified: Secondary | ICD-10-CM

## 2013-10-26 DIAGNOSIS — I4891 Unspecified atrial fibrillation: Secondary | ICD-10-CM

## 2013-10-26 DIAGNOSIS — F4321 Adjustment disorder with depressed mood: Secondary | ICD-10-CM

## 2013-10-26 DIAGNOSIS — E8881 Metabolic syndrome: Secondary | ICD-10-CM

## 2013-10-26 LAB — POCT CBC
Granulocyte percent: 69.7 %G (ref 37–80)
HEMATOCRIT: 33.9 % — AB (ref 37.7–47.9)
HEMOGLOBIN: 11.3 g/dL — AB (ref 12.2–16.2)
Lymph, poc: 1.4 (ref 0.6–3.4)
MCH, POC: 31 pg (ref 27–31.2)
MCHC: 33.4 g/dL (ref 31.8–35.4)
MCV: 92.9 fL (ref 80–97)
MPV: 9.3 fL (ref 0–99.8)
POC Granulocyte: 3.7 (ref 2–6.9)
POC LYMPH %: 25.7 % (ref 10–50)
Platelet Count, POC: 178 10*3/uL (ref 142–424)
RBC: 3.7 M/uL — AB (ref 4.04–5.48)
RDW, POC: 14 %
WBC: 5.3 10*3/uL (ref 4.6–10.2)

## 2013-10-26 LAB — POCT GLYCOSYLATED HEMOGLOBIN (HGB A1C): Hemoglobin A1C: 6

## 2013-10-26 MED ORDER — HYDROCORTISONE 1 % RE CREA: TOPICAL_CREAM | RECTAL | Status: DC

## 2013-10-26 MED ORDER — LEVOTHYROXINE SODIUM 137 MCG PO TABS: 137.0000 ug | ORAL_TABLET | Freq: Every day | ORAL | Status: DC

## 2013-10-26 NOTE — Progress Notes (Signed)
Subjective:    Patient ID: [name], female    DOB: Jul 03, 1932, 80 y.o.   MRN: [id number]  HPI Pt here for follow up and management of chronic medical problems. The patient complains of some allergy issues and she's been rubbing her eyes some. She also complains of itching in that area rectal area. And she makes note that her dog was recently treated for warm. A repeat blood pressure was 136/62. She will start checking her blood pressures at home and bring some readings for review in about 4 weeks.         Patient Active Problem List   Diagnosis Date Noted  . Osteoporosis, post-menopausal 08/12/2013  . Metabolic syndrome 41/58/3094  . Anemia, iron deficiency 03/16/2013  . Vitamin D deficiency 03/16/2013  . HTN (hypertension) 03/16/2013  . hypothyroidism   . Urinary tract infection, site not specified   . Diaphragmatic hernia without mention of obstruction or gangrene   . Esophagitis   . hyperlipidemia   . Prolapse of vaginal walls without mention of uterine prolapse   . Arthritis   . First degree atrioventricular block   . Symptomatic menopausal or female climacteric states   . AF (atrial fibrillation)   . Rectal polyp   . Gastric polyp    Outpatient Encounter Prescriptions as of 10/26/2013  Medication Sig  . calcium-vitamin D (OSCAL WITH D) 500-200 MG-UNIT per tablet Take 1 tablet by mouth daily.  . Cholecalciferol (VITAMIN D3) 2000 UNITS TABS Take 1 tablet by mouth daily.    . ferrous fumarate-iron polysaccharide complex (TANDEM) 162-115.2 MG CAPS Take 1 capsule by mouth daily with breakfast.  . furosemide (LASIX) 40 MG tablet Take 1 tablet (40 mg total) by mouth daily.  Marland Kitchen levothyroxine (SYNTHROID, LEVOTHROID) 137 MCG tablet Take 137 mcg by mouth daily. Except take 1/2 tablet on Mon,Wed,Fri.  . lisinopril (PRINIVIL,ZESTRIL) 10 MG tablet One tablet daily  . Multiple Vitamins-Minerals (CENTRUM SILVER) tablet Take 1 tablet by mouth daily.   . potassium chloride  (KLOR-CON 10) 10 MEQ tablet Take 1 tablet (10 mEq total) by mouth daily.  . [DISCONTINUED] alendronate (FOSAMAX) 70 MG tablet Take 1 tablet (70 mg total) by mouth every 7 (seven) days. Take with a full glass of water on an empty stomach.    Review of Systems  Constitutional: Negative.   HENT: Negative.   Eyes: Negative.   Respiratory: Negative.   Cardiovascular: Negative.   Gastrointestinal: Negative.   Endocrine: Negative.   Genitourinary: Negative.   Musculoskeletal: Negative.   Skin: Negative.   Allergic/Immunologic: Negative.   Neurological: Negative.   Hematological: Negative.   Psychiatric/Behavioral: Negative.        Objective:   Physical Exam  Nursing note and vitals reviewed. Constitutional: She is oriented to person, place, and time. She appears well-developed and well-nourished. No distress.  HENT:  Head: Normocephalic and atraumatic.  Right Ear: External ear normal.  Left Ear: External ear normal.  Nose: Nose normal.  Mouth/Throat: Oropharynx is clear and moist.  Eyes: Conjunctivae and EOM are normal. Pupils are equal, round, and reactive to light. Right eye exhibits no discharge. Left eye exhibits no discharge. No scleral icterus.  Neck: Normal range of motion. Neck supple. No JVD present. No thyromegaly present.  No carotid bruits  Cardiovascular: Normal rate, normal heart sounds and intact distal pulses.  Exam reveals no gallop and no friction rub.   No murmur heard. The rhythm was irregular irregular at 72 per minute  Pulmonary/Chest: Effort  normal and breath sounds normal. No respiratory distress. She has no wheezes. She has no rales. She exhibits no tenderness.  There were no axillary nodes  Abdominal: Soft. Bowel sounds are normal. She exhibits no mass. There is no tenderness. There is no rebound and no guarding.  The abdomen is obese with out abdominal bruits  Genitourinary:  There was perianal erythema but no masses or lesions  Musculoskeletal: Normal  range of motion. She exhibits no edema and no tenderness.  Lymphadenopathy:    She has no cervical adenopathy.  Neurological: She is alert and oriented to person, place, and time. She has normal reflexes. No cranial nerve deficit.  Skin: Skin is warm and dry. There is erythema (perianal).  Psychiatric: She has a normal mood and affect. Her behavior is normal. Judgment and thought content normal.  Somewhat anxious and depressed mood due to her circumstances   BP 158/82  Pulse 70  Temp(Src) 99 F (37.2 C) (Oral)  Ht '5\' 4"'  (1.626 m)  Wt 215 lb (97.523 kg)  BMI 36.89 kg/m2   repeat blood pressure 136/62 right arm sitting with a manual cuff      Assessment & Plan:  1. Vitamin D deficiency - POCT CBC - Vit D  25 hydroxy (rtn osteoporosis monitoring)  2. Atrial fibrillation, unspecified - POCT CBC  3. Anemia, iron deficiency - POCT CBC  4. Essential hypertension - POCT CBC - BMP8+EGFR - Hepatic function panel  5. hyperlipidemia - POCT CBC - NMR, lipoprofile  6. hypothyroidism - POCT CBC  7. Metabolic syndrome - POCT CBC - POCT glycosylated hemoglobin (Hb A1C)  8. Adjustment disorder with depressed mood  Meds ordered this encounter  Medications  . hydrocortisone (PROCTOCORT) 1 % CREA    Sig: Apply to area BID PRN    Dispense:  28.4 g    Refill:  1  . DISCONTD: levothyroxine (SYNTHROID, LEVOTHROID) 137 MCG tablet    Sig: Take 1 tablet (137 mcg total) by mouth daily. As directed    Dispense:  30 tablet    Refill:  6  . levothyroxine (SYNTHROID, LEVOTHROID) 137 MCG tablet    Sig: Take 1 tablet (137 mcg total) by mouth daily. As directed    Dispense:  90 tablet    Refill:  3   Patient Instructions                       Medicare Annual Wellness Visit  [name] and the medical providers at [facility] strive to bring you the best medical care.  In doing so we not only want to address your current medical conditions and concerns but also to  detect new conditions early and prevent illness, disease and health-related problems.    Medicare offers a yearly Wellness Visit which allows our clinical staff to assess your need for preventative services including immunizations, lifestyle education, counseling to decrease risk of preventable diseases and screening for fall risk and other medical concerns.    This visit is provided free of charge (no copay) for all Medicare recipients. The clinical pharmacists at [facility] have begun to conduct these Wellness Visits which will also include a thorough review of all your medications.    As you primary medical provider recommend that you make an appointment for your Annual Wellness Visit if you have not done so already this year.  You may set up this appointment before you leave today or you may call  [phone] and schedule an appointment.  Please make sure when you call that you mention that you are scheduling your Annual Wellness Visit with the clinical pharmacist so that the appointment may be made for the proper length of time.     Continue current medications. Continue good therapeutic lifestyle changes which include good diet and exercise. Fall precautions discussed with patient. If an FOBT was given today- please return it to our front desk. If you are over 50 years old - you may need Prevnar 56 or the adult Pneumonia vaccine.  Make sure that you use scent free fabric softener, soap, and detergent Use the prescription cream that we called in right leg twice daily for 7-10 days to the perianal area Decrease sodium intake bring blood pressure readings for review in 4 weeks Return  the FOBT      Arrie Senate MD

## 2013-10-26 NOTE — Patient Instructions (Addendum)
Medicare Annual Wellness Visit  [facility] and the medical providers at [facility] strive to bring you the best medical care.  In doing so we not only want to address your current medical conditions and concerns but also to detect new conditions early and prevent illness, disease and health-related problems.    Medicare offers a yearly Wellness Visit which allows our clinical staff to assess your need for preventative services including immunizations, lifestyle education, counseling to decrease risk of preventable diseases and screening for fall risk and other medical concerns.    This visit is provided free of charge (no copay) for all Medicare recipients. The clinical [facility]ists [facility] have begun to conduct these Wellness Visits which will also include a thorough review of all your medications.    As you primary medical provider recommend that you make an appointment for your Annual Wellness Visit if you have not done so already this year.  You may set up this appointment before you leave today or you may call back [phone] and schedule an appointment.  Please make sure when you call that you mention that you are scheduling your Annual Wellness Visit with the clinical pharmacist so that the appointment may be made for the proper length of time.     Continue current medications. Continue good therapeutic lifestyle changes which include good diet and exercise. Fall precautions discussed with patient. If an FOBT was given today- please return it to our front desk. If you are over 78 years old - you may need Prevnar 13 or the adult Pneumonia vaccine.  Make sure that you use scent free fabric softener, soap, and detergent Use the prescription cream that we called in right leg twice daily for 7-10 days to the perianal area Decrease sodium intake bring blood pressure readings for review in 4 weeks Return  the FOBT

## 2013-10-27 LAB — NMR, LIPOPROFILE
Cholesterol: 210 mg/dL — ABNORMAL HIGH (ref 100–199)
HDL Cholesterol by NMR: 57 mg/dL (ref >39)
HDL Particle Number: 36.3 umol/L (ref >=30.5)
LDL Particle Number: 1414 nmol/L — ABNORMAL HIGH (ref <1000)
LDL Size: 20.2 nm (ref >20.5)
LDLC SERPL CALC-MCNC: 123 mg/dL — ABNORMAL HIGH (ref 0–99)
LP-IR Score: 61 — ABNORMAL HIGH (ref <=45)
SMALL LDL PARTICLE NUMBER: 617 nmol/L — AB (ref <=527)
Triglycerides by NMR: 150 mg/dL — ABNORMAL HIGH (ref 0–149)

## 2013-10-27 LAB — HEPATIC FUNCTION PANEL
ALT: 10 IU/L (ref 0–32)
AST: 17 IU/L (ref 0–40)
Albumin: 4.4 g/dL (ref 3.5–4.8)
Alkaline Phosphatase: 86 IU/L (ref 39–117)
BILIRUBIN DIRECT: 0.1 mg/dL (ref 0.00–0.40)
BILIRUBIN TOTAL: 0.4 mg/dL (ref 0.0–1.2)
Total Protein: 7 g/dL (ref 6.0–8.5)

## 2013-10-27 LAB — BMP8+EGFR
BUN / CREAT RATIO: 24 (ref 11–26)
BUN: 24 mg/dL (ref 8–27)
CO2: 29 mmol/L (ref 18–29)
CREATININE: 1 mg/dL (ref 0.57–1.00)
Calcium: 9.7 mg/dL (ref 8.7–10.3)
Chloride: 100 mmol/L (ref 97–108)
GFR calc Af Amer: 62 mL/min/{1.73_m2} (ref >59)
GFR, EST NON AFRICAN AMERICAN: 54 mL/min/{1.73_m2} — AB (ref >59)
Glucose: 95 mg/dL (ref 65–99)
Potassium: 4.4 mmol/L (ref 3.5–5.2)
Sodium: 144 mmol/L (ref 134–144)

## 2013-10-27 LAB — VITAMIN D 25 HYDROXY (VIT D DEFICIENCY, FRACTURES): VIT D 25 HYDROXY: 47.4 ng/mL (ref 30.0–100.0)

## 2013-10-30 ENCOUNTER — Other Ambulatory Visit: Payer: Medicare Other

## 2013-10-30 DIAGNOSIS — Z1212 Encounter for screening for malignant neoplasm of rectum: Secondary | ICD-10-CM

## 2013-11-01 LAB — FECAL OCCULT BLOOD, IMMUNOCHEMICAL: FECAL OCCULT BLD: NEGATIVE

## 2013-11-03 ENCOUNTER — Encounter: Payer: Self-pay | Admitting: *Deleted

## 2013-11-10 ENCOUNTER — Ambulatory Visit (INDEPENDENT_AMBULATORY_CARE_PROVIDER_SITE_OTHER): Payer: Medicare Other | Admitting: *Deleted

## 2013-11-10 DIAGNOSIS — E538 Deficiency of other specified B group vitamins: Secondary | ICD-10-CM

## 2013-11-10 NOTE — Progress Notes (Signed)
Patient tolerated well.

## 2013-11-10 NOTE — Patient Instructions (Signed)
Vitamin B12 Injections Every person needs vitamin B12. A deficiency develops when the body does not get enough of it. One way to overcome this is by getting B12 shots (injections). A B12 shot puts the vitamin directly into muscle tissue. This avoids any problems your body might have in absorbing it from food or a pill. In some people, the body has trouble using the vitamin correctly. This can cause a B12 deficiency. Not consuming enough of the vitamin can also cause a deficiency. Getting enough vitamin B12 can be hard for elderly people. Sometimes, they do not eat a well-balanced diet. The elderly are also more likely than younger people to have medical conditions or take medications that can lead to a deficiency. WHAT DOES VITAMIN B12 DO? Vitamin B12 does many things to help the body work right:  It helps the body make healthy red blood cells.  It helps maintain nerve cells.  It is involved in the body's process of converting food into energy (metabolism).  It is needed to make the genetic material in all cells (DNA). VITAMIN B12 FOOD SOURCES Most people get plenty of vitamin B12 through the foods they eat. It is present in:  Meat, fish, poultry, and eggs.  Milk and milk products.  It also is added when certain foods are made, including some breads, cereals and yogurts. The food is then called "fortified". CAUSES The most common causes of vitamin B12 deficiency are:  Pernicious anemia. The condition develops when the body cannot make enough healthy red blood cells. This stems from a lack of a protein made in the stomach (intrinsic factor). People without this protein cannot absorb enough vitamin B12 from food.  Malabsorption. This is when the body cannot absorb the vitamin. It can be caused by:  Pernicious anemia.  Surgery to remove part or all of the stomach can lead to malabsorption. Removal of part or all of the small intestine can also cause malabsorption.  Vegetarian diet.  People who are strict about not eating foods from animals could have trouble taking in enough vitamin B12 from diet alone.  Medications. Some medicines have been linked to B12 deficiency, such as Metformin (a drug prescribed for type 2 diabetes). Long-term use of stomach acid suppressants also can keep the vitamin from being absorbed.  Intestinal problems such as inflammatory bowel disease. If there are problems in the digestive tract, vitamin B12 may not be absorbed in good enough amounts. SYMPTOMS People who do not get enough B12 can develop problems. These can include:  Anemia. This is when the body has too few red blood cells. Red blood cells carry oxygen to the rest of the body. Without a healthy supply of red blood cells, people can feel:  Tired (fatigued).  Weak.  Severe anemia can cause:  Shortness of breath.  Dizziness.  Rapid heart rate.  Paleness.  Other Vitamin B12 deficiency symptoms include:  Diarrhea.  Numbness or tingling in the hands or feet.  Loss of appetite.  Confusion.  Sores on the tongue or in the mouth. LET YOUR CAREGIVER KNOW ABOUT:  Any allergies. It is very important to know if you are allergic or sensitive to cobalt. Vitamin B12 contains cobalt.  Any history of kidney disease.  All medications you are taking. Include prescription and over-the-counter medicines, herbs and creams.  Whether you are pregnant or breast-feeding.  If you have Leber's disease, a hereditary eye condition, vitamin B12 could make it worse. RISKS AND COMPLICATIONS Reactions to an injection are   usually temporary. They might include:  Pain at the injection site.  Redness, swelling or tenderness at the site.  Headache, dizziness or weakness.  Nausea, upset stomach or diarrhea.  Numbness or tingling.  Fever.  Joint pain.  Itching or rash. If a reaction does not go away in a short while, talk with your healthcare provider. A change in the way the shots are  given, or where they are given, might need to be made. BEFORE AN INJECTION To decide whether B12 injections are right for you, your healthcare provider will probably:  Ask about your medical history.  Ask questions about your diet.  Ask about symptoms such as:  Have you felt weak?  Do you feel unusually tired?  Do you get dizzy?  Order blood tests. These may include a test to:  Check the level of red cells in your blood.  Measure B12 levels.  Check for the presence of intrinsic factor. VITAMIN B12 INJECTIONS How often you will need a vitamin B12 injection will depend on how severe your deficiency is. This also will affect how long you will need to get them. People with pernicious anemia usually get injections for their entire life. Others might get them for a shorter period. For many people, injections are given daily or weekly for several weeks. Then, once B12 levels are normal, injections are given just once a month. If the cause of the deficiency can be fixed, the injections can be stopped. Talk with your healthcare provider about what you should expect. For an injection:  The injection site will be cleaned with an alcohol swab.  Your healthcare provider will insert a needle directly into a muscle. Most any muscle can be used. Most often, an arm muscle is used. A buttocks muscle can also be used. Many people say shots in that area are less painful.  A small adhesive bandage may be put over the injection site. It usually can be taken off in an hour or less. Injections can be given by your healthcare provider. In some cases, family members give them. Sometimes, people give them to themselves. Talk with your healthcare provider about what would be best for you. If someone other than your healthcare provider will be giving the shots, the person will need to be trained to give them correctly. HOME CARE INSTRUCTIONS   You can remove the adhesive bandage within an hour of getting a  shot.  You should be able to go about your normal activities right away.  Avoid drinking large amounts of alcohol while taking vitamin B12 shots. Alcohol can interfere with the body's use of the vitamin. SEEK MEDICAL CARE IF:   Pain, redness, swelling or tenderness at the injection site does not get better or gets worse.  Headache, dizziness or weakness does not go away.  You develop a fever of more than 100.5 F (38.1 C). SEEK IMMEDIATE MEDICAL CARE IF:   You have chest pain.  You develop shortness of breath.  You have muscle weakness that gets worse.  You develop numbness, weakness or tingling on one side or one area of the body.  You have symptoms of an allergic reaction, such as:  Hives.  Difficulty breathing.  Swelling of the lips, face, tongue or throat.  You develop a fever of more than 102.0 F (38.9 C). MAKE SURE YOU:   Understand these instructions.  Will watch your condition.  Will get help right away if you are not doing well or get worse. Document   Released: 06/15/2008 Document Revised: 06/11/2011 Document Reviewed: 06/15/2008 Exit[employer] Patient Information 2015 [employer]. This information is not intended to replace advice given to you by your health care provider. Make sure you discuss any questions you have with your health care provider.  

## 2013-12-14 ENCOUNTER — Ambulatory Visit (INDEPENDENT_AMBULATORY_CARE_PROVIDER_SITE_OTHER): Payer: Medicare Other | Admitting: *Deleted

## 2013-12-14 DIAGNOSIS — E538 Deficiency of other specified B group vitamins: Secondary | ICD-10-CM

## 2013-12-14 NOTE — Patient Instructions (Signed)
Vitamin B12 Injections Every person needs vitamin B12. A deficiency develops when the body does not get enough of it. One way to overcome this is by getting B12 shots (injections). A B12 shot puts the vitamin directly into muscle tissue. This avoids any problems your body might have in absorbing it from food or a pill. In some people, the body has trouble using the vitamin correctly. This can cause a B12 deficiency. Not consuming enough of the vitamin can also cause a deficiency. Getting enough vitamin B12 can be hard for elderly people. Sometimes, they do not eat a well-balanced diet. The elderly are also more likely than younger people to have medical conditions or take medications that can lead to a deficiency. WHAT DOES VITAMIN B12 DO? Vitamin B12 does many things to help the body work right:  It helps the body make healthy red blood cells.  It helps maintain nerve cells.  It is involved in the body's process of converting food into energy (metabolism).  It is needed to make the genetic material in all cells (DNA). VITAMIN B12 FOOD SOURCES Most people get plenty of vitamin B12 through the foods they eat. It is present in:  Meat, fish, poultry, and eggs.  Milk and milk products.  It also is added when certain foods are made, including some breads, cereals and yogurts. The food is then called "fortified". CAUSES The most common causes of vitamin B12 deficiency are:  Pernicious anemia. The condition develops when the body cannot make enough healthy red blood cells. This stems from a lack of a protein made in the stomach (intrinsic factor). People without this protein cannot absorb enough vitamin B12 from food.  Malabsorption. This is when the body cannot absorb the vitamin. It can be caused by:  Pernicious anemia.  Surgery to remove part or all of the stomach can lead to malabsorption. Removal of part or all of the small intestine can also cause malabsorption.  Vegetarian diet.  People who are strict about not eating foods from animals could have trouble taking in enough vitamin B12 from diet alone.  Medications. Some medicines have been linked to B12 deficiency, such as Metformin (a drug prescribed for type 2 diabetes). Long-term use of stomach acid suppressants also can keep the vitamin from being absorbed.  Intestinal problems such as inflammatory bowel disease. If there are problems in the digestive tract, vitamin B12 may not be absorbed in good enough amounts. SYMPTOMS People who do not get enough B12 can develop problems. These can include:  Anemia. This is when the body has too few red blood cells. Red blood cells carry oxygen to the rest of the body. Without a healthy supply of red blood cells, people can feel:  Tired (fatigued).  Weak.  Severe anemia can cause:  Shortness of breath.  Dizziness.  Rapid heart rate.  Paleness.  Other Vitamin B12 deficiency symptoms include:  Diarrhea.  Numbness or tingling in the hands or feet.  Loss of appetite.  Confusion.  Sores on the tongue or in the mouth. LET YOUR CAREGIVER KNOW ABOUT:  Any allergies. It is very important to know if you are allergic or sensitive to cobalt. Vitamin B12 contains cobalt.  Any history of kidney disease.  All medications you are taking. Include prescription and over-the-counter medicines, herbs and creams.  Whether you are pregnant or breast-feeding.  If you have Leber's disease, a hereditary eye condition, vitamin B12 could make it worse. RISKS AND COMPLICATIONS Reactions to an injection are   usually temporary. They might include:  Pain at the injection site.  Redness, swelling or tenderness at the site.  Headache, dizziness or weakness.  Nausea, upset stomach or diarrhea.  Numbness or tingling.  Fever.  Joint pain.  Itching or rash. If a reaction does not go away in a short while, talk with your healthcare provider. A change in the way the shots are  given, or where they are given, might need to be made. BEFORE AN INJECTION To decide whether B12 injections are right for you, your healthcare provider will probably:  Ask about your medical history.  Ask questions about your diet.  Ask about symptoms such as:  Have you felt weak?  Do you feel unusually tired?  Do you get dizzy?  Order blood tests. These may include a test to:  Check the level of red cells in your blood.  Measure B12 levels.  Check for the presence of intrinsic factor. VITAMIN B12 INJECTIONS How often you will need a vitamin B12 injection will depend on how severe your deficiency is. This also will affect how long you will need to get them. People with pernicious anemia usually get injections for their entire life. Others might get them for a shorter period. For many people, injections are given daily or weekly for several weeks. Then, once B12 levels are normal, injections are given just once a month. If the cause of the deficiency can be fixed, the injections can be stopped. Talk with your healthcare provider about what you should expect. For an injection:  The injection site will be cleaned with an alcohol swab.  Your healthcare provider will insert a needle directly into a muscle. Most any muscle can be used. Most often, an arm muscle is used. A buttocks muscle can also be used. Many people say shots in that area are less painful.  A small adhesive bandage may be put over the injection site. It usually can be taken off in an hour or less. Injections can be given by your healthcare provider. In some cases, family members give them. Sometimes, people give them to themselves. Talk with your healthcare provider about what would be best for you. If someone other than your healthcare provider will be giving the shots, the person will need to be trained to give them correctly. HOME CARE INSTRUCTIONS   You can remove the adhesive bandage within an hour of getting a  shot.  You should be able to go about your normal activities right away.  Avoid drinking large amounts of alcohol while taking vitamin B12 shots. Alcohol can interfere with the body's use of the vitamin. SEEK MEDICAL CARE IF:   Pain, redness, swelling or tenderness at the injection site does not get better or gets worse.  Headache, dizziness or weakness does not go away.  You develop a fever of more than 100.5 F (38.1 C). SEEK IMMEDIATE MEDICAL CARE IF:   You have chest pain.  You develop shortness of breath.  You have muscle weakness that gets worse.  You develop numbness, weakness or tingling on one side or one area of the body.  You have symptoms of an allergic reaction, such as:  Hives.  Difficulty breathing.  Swelling of the lips, face, tongue or throat.  You develop a fever of more than 102.0 F (38.9 C). MAKE SURE YOU:   Understand these instructions.  Will watch your condition.  Will get help right away if you are not doing well or get worse. Document   Released: 06/15/2008 Document Revised: 06/11/2011 Document Reviewed: 06/15/2008 Exit[employer] Patient Information 2015 [employer]. This information is not intended to replace advice given to you by your health care provider. Make sure you discuss any questions you have with your health care provider.  

## 2013-12-14 NOTE — Progress Notes (Signed)
Patient ID: [name], female   DOB: [birth year], 80 y.o.   MRN: [id number] Vitamin b12 given and tolerated well

## 2013-12-28 ENCOUNTER — Ambulatory Visit (INDEPENDENT_AMBULATORY_CARE_PROVIDER_SITE_OTHER): Payer: Medicare Other

## 2013-12-28 DIAGNOSIS — Z23 Encounter for immunization: Secondary | ICD-10-CM

## 2014-01-01 ENCOUNTER — Encounter: Payer: Self-pay | Admitting: *Deleted

## 2014-01-14 ENCOUNTER — Ambulatory Visit (INDEPENDENT_AMBULATORY_CARE_PROVIDER_SITE_OTHER): Payer: Medicare Other | Admitting: *Deleted

## 2014-01-14 DIAGNOSIS — E538 Deficiency of other specified B group vitamins: Secondary | ICD-10-CM

## 2014-01-14 NOTE — Progress Notes (Signed)
Patient ID: [name] DakinBessie Dorce, female   DOB: 10/29/1932, 80 y.o.   MRN: [id number] Vitamin b12 given and tolerated well.

## 2014-01-14 NOTE — Patient Instructions (Signed)
Vitamin B12 Injections Every person needs vitamin B12. A deficiency develops when the body does not get enough of it. One way to overcome this is by getting B12 shots (injections). A B12 shot puts the vitamin directly into muscle tissue. This avoids any problems your body might have in absorbing it from food or a pill. In some people, the body has trouble using the vitamin correctly. This can cause a B12 deficiency. Not consuming enough of the vitamin can also cause a deficiency. Getting enough vitamin B12 can be hard for elderly people. Sometimes, they do not eat a well-balanced diet. The elderly are also more likely than younger people to have medical conditions or take medications that can lead to a deficiency. WHAT DOES VITAMIN B12 DO? Vitamin B12 does many things to help the body work right:  It helps the body make healthy red blood cells.  It helps maintain nerve cells.  It is involved in the body's process of converting food into energy (metabolism).  It is needed to make the genetic material in all cells (DNA). VITAMIN B12 FOOD SOURCES Most people get plenty of vitamin B12 through the foods they eat. It is present in:  Meat, fish, poultry, and eggs.  Milk and milk products.  It also is added when certain foods are made, including some breads, cereals and yogurts. The food is then called "fortified". CAUSES The most common causes of vitamin B12 deficiency are:  Pernicious anemia. The condition develops when the body cannot make enough healthy red blood cells. This stems from a lack of a protein made in the stomach (intrinsic factor). People without this protein cannot absorb enough vitamin B12 from food.  Malabsorption. This is when the body cannot absorb the vitamin. It can be caused by:  Pernicious anemia.  Surgery to remove part or all of the stomach can lead to malabsorption. Removal of part or all of the small intestine can also cause malabsorption.  Vegetarian diet.  People who are strict about not eating foods from animals could have trouble taking in enough vitamin B12 from diet alone.  Medications. Some medicines have been linked to B12 deficiency, such as Metformin (a drug prescribed for type 2 diabetes). Long-term use of stomach acid suppressants also can keep the vitamin from being absorbed.  Intestinal problems such as inflammatory bowel disease. If there are problems in the digestive tract, vitamin B12 may not be absorbed in good enough amounts. SYMPTOMS People who do not get enough B12 can develop problems. These can include:  Anemia. This is when the body has too few red blood cells. Red blood cells carry oxygen to the rest of the body. Without a healthy supply of red blood cells, people can feel:  Tired (fatigued).  Weak.  Severe anemia can cause:  Shortness of breath.  Dizziness.  Rapid heart rate.  Paleness.  Other Vitamin B12 deficiency symptoms include:  Diarrhea.  Numbness or tingling in the hands or feet.  Loss of appetite.  Confusion.  Sores on the tongue or in the mouth. LET YOUR CAREGIVER KNOW ABOUT:  Any allergies. It is very important to know if you are allergic or sensitive to cobalt. Vitamin B12 contains cobalt.  Any history of kidney disease.  All medications you are taking. Include prescription and over-the-counter medicines, herbs and creams.  Whether you are pregnant or breast-feeding.  If you have Leber's disease, a hereditary eye condition, vitamin B12 could make it worse. RISKS AND COMPLICATIONS Reactions to an injection are   usually temporary. They might include:  Pain at the injection site.  Redness, swelling or tenderness at the site.  Headache, dizziness or weakness.  Nausea, upset stomach or diarrhea.  Numbness or tingling.  Fever.  Joint pain.  Itching or rash. If a reaction does not go away in a short while, talk with your healthcare provider. A change in the way the shots are  given, or where they are given, might need to be made. BEFORE AN INJECTION To decide whether B12 injections are right for you, your healthcare provider will probably:  Ask about your medical history.  Ask questions about your diet.  Ask about symptoms such as:  Have you felt weak?  Do you feel unusually tired?  Do you get dizzy?  Order blood tests. These may include a test to:  Check the level of red cells in your blood.  Measure B12 levels.  Check for the presence of intrinsic factor. VITAMIN B12 INJECTIONS How often you will need a vitamin B12 injection will depend on how severe your deficiency is. This also will affect how long you will need to get them. People with pernicious anemia usually get injections for their entire life. Others might get them for a shorter period. For many people, injections are given daily or weekly for several weeks. Then, once B12 levels are normal, injections are given just once a month. If the cause of the deficiency can be fixed, the injections can be stopped. Talk with your healthcare provider about what you should expect. For an injection:  The injection site will be cleaned with an alcohol swab.  Your healthcare provider will insert a needle directly into a muscle. Most any muscle can be used. Most often, an arm muscle is used. A buttocks muscle can also be used. Many people say shots in that area are less painful.  A small adhesive bandage may be put over the injection site. It usually can be taken off in an hour or less. Injections can be given by your healthcare provider. In some cases, family members give them. Sometimes, people give them to themselves. Talk with your healthcare provider about what would be best for you. If someone other than your healthcare provider will be giving the shots, the person will need to be trained to give them correctly. HOME CARE INSTRUCTIONS   You can remove the adhesive bandage within an hour of getting a  shot.  You should be able to go about your normal activities right away.  Avoid drinking large amounts of alcohol while taking vitamin B12 shots. Alcohol can interfere with the body's use of the vitamin. SEEK MEDICAL CARE IF:   Pain, redness, swelling or tenderness at the injection site does not get better or gets worse.  Headache, dizziness or weakness does not go away.  You develop a fever of more than 100.5 F (38.1 C). SEEK IMMEDIATE MEDICAL CARE IF:   You have chest pain.  You develop shortness of breath.  You have muscle weakness that gets worse.  You develop numbness, weakness or tingling on one side or one area of the body.  You have symptoms of an allergic reaction, such as:  Hives.  Difficulty breathing.  Swelling of the lips, face, tongue or throat.  You develop a fever of more than 102.0 F (38.9 C). MAKE SURE YOU:   Understand these instructions.  Will watch your condition.  Will get help right away if you are not doing well or get worse. Document   Released: 06/15/2008 Document Revised: 06/11/2011 Document Reviewed: 06/15/2008 Exit[employer] Patient Information 2015 [employer]. This information is not intended to replace advice given to you by your health care provider. Make sure you discuss any questions you have with your health care provider.  

## 2014-01-25 ENCOUNTER — Telehealth: Payer: Self-pay | Admitting: Cardiology

## 2014-01-25 NOTE — Telephone Encounter (Signed)
I forward this form [facility]  to [place] in [place] department to get the rest of the form filled out. 01/25/14 cbr

## 2014-02-09 ENCOUNTER — Encounter: Payer: Self-pay | Admitting: Nurse Practitioner

## 2014-02-09 ENCOUNTER — Ambulatory Visit (INDEPENDENT_AMBULATORY_CARE_PROVIDER_SITE_OTHER): Payer: Medicare Other | Admitting: Nurse Practitioner

## 2014-02-09 VITALS — BP 165/74 | HR 70 | Temp 98.6°F | Ht 64.0 in | Wt 221.6 lb

## 2014-02-09 DIAGNOSIS — J209 Acute bronchitis, unspecified: Secondary | ICD-10-CM

## 2014-02-09 DIAGNOSIS — J069 Acute upper respiratory infection, unspecified: Secondary | ICD-10-CM

## 2014-02-09 MED ORDER — AZITHROMYCIN 250 MG PO TABS: ORAL_TABLET | ORAL | Status: DC

## 2014-02-09 MED ORDER — BENZONATATE 100 MG PO CAPS: 100.0000 mg | ORAL_CAPSULE | Freq: Two times a day (BID) | ORAL | Status: DC | PRN

## 2014-02-09 NOTE — Progress Notes (Signed)
   Subjective:    Patient ID: [name] DakinBessie Pharo, female    DOB: [birth year], 80 y.o.   MRN: [id number]  HPI Patient in today c/o nasal congestion and cough- has taken mucinex OTC which helped some.    Review of Systems  Constitutional: Negative.  Negative for fever, chills and fatigue.  HENT: Positive for congestion and postnasal drip. Negative for sore throat and trouble swallowing.   Respiratory: Positive for cough.   Cardiovascular: Negative.   Genitourinary: Negative.   Musculoskeletal: Negative.   Neurological: Negative.   Psychiatric/Behavioral: Negative.   All other systems reviewed and are negative.      Objective:   Physical Exam  Constitutional: She appears well-developed and well-nourished. No distress.  HENT:  Right Ear: Hearing, tympanic membrane, external ear and ear canal normal.  Left Ear: Hearing, tympanic membrane, external ear and ear canal normal.  Nose: Mucosal edema and rhinorrhea present. Right sinus exhibits maxillary sinus tenderness. Right sinus exhibits no frontal sinus tenderness. Left sinus exhibits maxillary sinus tenderness. Left sinus exhibits no frontal sinus tenderness.  Mouth/Throat: Uvula is midline, oropharynx is clear and moist and mucous membranes are normal.  Eyes: Pupils are equal, round, and reactive to light.  Neck: Normal range of motion. Neck supple.  Cardiovascular: Normal rate, regular rhythm and normal heart sounds.   Pulmonary/Chest: Effort normal. She has wheezes (exp wheezes throughout).  Lymphadenopathy:    She has no cervical adenopathy.  Neurological: She is alert.  Skin: Skin is warm and dry.  Psychiatric: She has a normal mood and affect. Her behavior is normal. Judgment and thought content normal.   BP 165/74 mmHg  Pulse 70  Temp(Src) 98.6 F (37 C) (Oral)  Ht 5\' 4"  (1.626 m)  Wt 221 lb 9.6 oz (100.517 kg)  BMI 38.02 kg/m2  SpO2 96%        Assessment & Plan:   1. Acute upper respiratory infection   2. Acute  bronchitis, unspecified organism    Meds ordered this encounter  Medications  . azithromycin (ZITHROMAX Z-PAK) 250 MG tablet    Sig: As directed    Dispense:  6 each    Refill:  0    Order Specific Question:  Supervising Provider    Answer:  Ernestina PennaMOORE, DONALD W [[id number]]  . benzonatate (TESSALON) 100 MG capsule    Sig: Take 1 capsule (100 mg total) by mouth 2 (two) times daily as needed for cough.    Dispense:  20 capsule    Refill:  0    Order Specific Question:  Supervising Provider    Answer:  Ernestina PennaMOORE, DONALD W [[id number]]   1. Take meds as prescribed 2. Use a cool mist humidifier especially during the winter months and when heat has been humid. 3. Use saline nose sprays frequently 4. Saline irrigations of the nose can be very helpful if done frequently.  * 4X daily for 1 week*  * Use of a nettie pot can be helpful with this. Follow directions with this* 5. Drink plenty of fluids 6. Keep thermostat turn down low 7.For any cough or congestion  Use plain Mucinex- regular strength or max strength is fine   * Children- consult with Pharmacist for dosing 8. For fever or aces or pains- take tylenol or ibuprofen appropriate for age and weight.  * for fevers greater than 101 orally you may alternate ibuprofen and tylenol every  3 hours.   Mary-Margaret Daphine DeutscherMartin, FNP

## 2014-02-09 NOTE — Patient Instructions (Signed)

## 2014-02-13 ENCOUNTER — Telehealth: Payer: Self-pay | Admitting: Nurse Practitioner

## 2014-02-15 ENCOUNTER — Ambulatory Visit (INDEPENDENT_AMBULATORY_CARE_PROVIDER_SITE_OTHER): Payer: Medicare Other | Admitting: *Deleted

## 2014-02-15 DIAGNOSIS — E538 Deficiency of other specified B group vitamins: Secondary | ICD-10-CM

## 2014-02-15 NOTE — Telephone Encounter (Signed)
No- zithromax stays in system for 10 days even though only take for 5 days- should not need another antibiotic.

## 2014-02-15 NOTE — Telephone Encounter (Signed)
Aware. 

## 2014-02-15 NOTE — Telephone Encounter (Signed)
Please advise 

## 2014-02-15 NOTE — Progress Notes (Signed)
Vitamin b12 given and tolerated well. 

## 2014-02-15 NOTE — Patient Instructions (Signed)
Vitamin B12 Injections Every person needs vitamin B12. A deficiency develops when the body does not get enough of it. One way to overcome this is by getting B12 shots (injections). A B12 shot puts the vitamin directly into muscle tissue. This avoids any problems your body might have in absorbing it from food or a pill. In some people, the body has trouble using the vitamin correctly. This can cause a B12 deficiency. Not consuming enough of the vitamin can also cause a deficiency. Getting enough vitamin B12 can be hard for elderly people. Sometimes, they do not eat a well-balanced diet. The elderly are also more likely than younger people to have medical conditions or take medications that can lead to a deficiency. WHAT DOES VITAMIN B12 DO? Vitamin B12 does many things to help the body work right:  It helps the body make healthy red blood cells.  It helps maintain nerve cells.  It is involved in the body's process of converting food into energy (metabolism).  It is needed to make the genetic material in all cells (DNA). VITAMIN B12 FOOD SOURCES Most people get plenty of vitamin B12 through the foods they eat. It is present in:  Meat, fish, poultry, and eggs.  Milk and milk products.  It also is added when certain foods are made, including some breads, cereals and yogurts. The food is then called "fortified". CAUSES The most common causes of vitamin B12 deficiency are:  Pernicious anemia. The condition develops when the body cannot make enough healthy red blood cells. This stems from a lack of a protein made in the stomach (intrinsic factor). People without this protein cannot absorb enough vitamin B12 from food.  Malabsorption. This is when the body cannot absorb the vitamin. It can be caused by:  Pernicious anemia.  Surgery to remove part or all of the stomach can lead to malabsorption. Removal of part or all of the small intestine can also cause malabsorption.  Vegetarian diet.  People who are strict about not eating foods from animals could have trouble taking in enough vitamin B12 from diet alone.  Medications. Some medicines have been linked to B12 deficiency, such as Metformin (a drug prescribed for type 2 diabetes). Long-term use of stomach acid suppressants also can keep the vitamin from being absorbed.  Intestinal problems such as inflammatory bowel disease. If there are problems in the digestive tract, vitamin B12 may not be absorbed in good enough amounts. SYMPTOMS People who do not get enough B12 can develop problems. These can include:  Anemia. This is when the body has too few red blood cells. Red blood cells carry oxygen to the rest of the body. Without a healthy supply of red blood cells, people can feel:  Tired (fatigued).  Weak.  Severe anemia can cause:  Shortness of breath.  Dizziness.  Rapid heart rate.  Paleness.  Other Vitamin B12 deficiency symptoms include:  Diarrhea.  Numbness or tingling in the hands or feet.  Loss of appetite.  Confusion.  Sores on the tongue or in the mouth. LET YOUR CAREGIVER KNOW ABOUT:  Any allergies. It is very important to know if you are allergic or sensitive to cobalt. Vitamin B12 contains cobalt.  Any history of kidney disease.  All medications you are taking. Include prescription and over-the-counter medicines, herbs and creams.  Whether you are pregnant or breast-feeding.  If you have Leber's disease, a hereditary eye condition, vitamin B12 could make it worse. RISKS AND COMPLICATIONS Reactions to an injection are   usually temporary. They might include:  Pain at the injection site.  Redness, swelling or tenderness at the site.  Headache, dizziness or weakness.  Nausea, upset stomach or diarrhea.  Numbness or tingling.  Fever.  Joint pain.  Itching or rash. If a reaction does not go away in a short while, talk with your healthcare provider. A change in the way the shots are  given, or where they are given, might need to be made. BEFORE AN INJECTION To decide whether B12 injections are right for you, your healthcare provider will probably:  Ask about your medical history.  Ask questions about your diet.  Ask about symptoms such as:  Have you felt weak?  Do you feel unusually tired?  Do you get dizzy?  Order blood tests. These may include a test to:  Check the level of red cells in your blood.  Measure B12 levels.  Check for the presence of intrinsic factor. VITAMIN B12 INJECTIONS How often you will need a vitamin B12 injection will depend on how severe your deficiency is. This also will affect how long you will need to get them. People with pernicious anemia usually get injections for their entire life. Others might get them for a shorter period. For many people, injections are given daily or weekly for several weeks. Then, once B12 levels are normal, injections are given just once a month. If the cause of the deficiency can be fixed, the injections can be stopped. Talk with your healthcare provider about what you should expect. For an injection:  The injection site will be cleaned with an alcohol swab.  Your healthcare provider will insert a needle directly into a muscle. Most any muscle can be used. Most often, an arm muscle is used. A buttocks muscle can also be used. Many people say shots in that area are less painful.  A small adhesive bandage may be put over the injection site. It usually can be taken off in an hour or less. Injections can be given by your healthcare provider. In some cases, family members give them. Sometimes, people give them to themselves. Talk with your healthcare provider about what would be best for you. If someone other than your healthcare provider will be giving the shots, the person will need to be trained to give them correctly. HOME CARE INSTRUCTIONS   You can remove the adhesive bandage within an hour of getting a  shot.  You should be able to go about your normal activities right away.  Avoid drinking large amounts of alcohol while taking vitamin B12 shots. Alcohol can interfere with the body's use of the vitamin. SEEK MEDICAL CARE IF:   Pain, redness, swelling or tenderness at the injection site does not get better or gets worse.  Headache, dizziness or weakness does not go away.  You develop a fever of more than 100.5 F (38.1 C). SEEK IMMEDIATE MEDICAL CARE IF:   You have chest pain.  You develop shortness of breath.  You have muscle weakness that gets worse.  You develop numbness, weakness or tingling on one side or one area of the body.  You have symptoms of an allergic reaction, such as:  Hives.  Difficulty breathing.  Swelling of the lips, face, tongue or throat.  You develop a fever of more than 102.0 F (38.9 C). MAKE SURE YOU:   Understand these instructions.  Will watch your condition.  Will get help right away if you are not doing well or get worse. Document   Released: 06/15/2008 Document Revised: 06/11/2011 Document Reviewed: 06/15/2008 Exit[employer] Patient Information 2015 [employer]. This information is not intended to replace advice given to you by your health care provider. Make sure you discuss any questions you have with your health care provider.  

## 2014-03-01 ENCOUNTER — Ambulatory Visit: Payer: Medicare Other | Admitting: Family Medicine

## 2014-03-04 ENCOUNTER — Ambulatory Visit (INDEPENDENT_AMBULATORY_CARE_PROVIDER_SITE_OTHER): Payer: Medicare Other

## 2014-03-04 ENCOUNTER — Ambulatory Visit (INDEPENDENT_AMBULATORY_CARE_PROVIDER_SITE_OTHER): Payer: Medicare Other | Admitting: Family Medicine

## 2014-03-04 ENCOUNTER — Encounter: Payer: Self-pay | Admitting: Family Medicine

## 2014-03-04 VITALS — BP 134/76 | HR 61 | Temp 97.4°F | Ht 64.0 in | Wt 215.0 lb

## 2014-03-04 DIAGNOSIS — R109 Unspecified abdominal pain: Secondary | ICD-10-CM

## 2014-03-04 DIAGNOSIS — I10 Essential (primary) hypertension: Secondary | ICD-10-CM

## 2014-03-04 DIAGNOSIS — G629 Polyneuropathy, unspecified: Secondary | ICD-10-CM

## 2014-03-04 DIAGNOSIS — I700 Atherosclerosis of aorta: Secondary | ICD-10-CM

## 2014-03-04 DIAGNOSIS — E039 Hypothyroidism, unspecified: Secondary | ICD-10-CM

## 2014-03-04 DIAGNOSIS — L570 Actinic keratosis: Secondary | ICD-10-CM

## 2014-03-04 DIAGNOSIS — E538 Deficiency of other specified B group vitamins: Secondary | ICD-10-CM

## 2014-03-04 DIAGNOSIS — M47814 Spondylosis without myelopathy or radiculopathy, thoracic region: Secondary | ICD-10-CM

## 2014-03-04 DIAGNOSIS — E559 Vitamin D deficiency, unspecified: Secondary | ICD-10-CM

## 2014-03-04 MED ORDER — LISINOPRIL 10 MG PO TABS: ORAL_TABLET | ORAL | Status: DC

## 2014-03-04 NOTE — Progress Notes (Signed)
Subjective:    Patient ID: [name], female    DOB: July 29, 1932, 80 y.o.   MRN: [id number]  HPI Pt here for follow up and management of chronic medical problems. See ROS. The abdominal cramping is mostly with a rising from a sitting position. It seems to go through to a sore place on her back. She also complains of a skin lesion on her left leg and her left forearm. The left leg lesion was Rohlfing course initially and is now smooth. Also see outside blood pressures brought to the visit for review and these were very good overall.   Patient Active Problem List   Diagnosis Date Noted  . Osteoporosis, post-menopausal 08/12/2013  . Metabolic syndrome 08/65/7846  . Anemia, iron deficiency 03/16/2013  . Vitamin D deficiency 03/16/2013  . HTN (hypertension) 03/16/2013  . hypothyroidism   . Urinary tract infection, site not specified   . Diaphragmatic hernia without mention of obstruction or gangrene   . Esophagitis   . hyperlipidemia   . Prolapse of vaginal walls without mention of uterine prolapse   . Arthritis   . First degree atrioventricular block   . Symptomatic menopausal or female climacteric states   . AF (atrial fibrillation)   . Rectal polyp   . Gastric polyp    Outpatient Encounter Prescriptions as of 03/04/2014  Medication Sig  . calcium-vitamin D (OSCAL WITH D) 500-200 MG-UNIT per tablet Take 1 tablet by mouth daily.  . Cholecalciferol (VITAMIN D3) 2000 UNITS TABS Take 1 tablet by mouth daily.    . ferrous fumarate-iron polysaccharide complex (TANDEM) 162-115.2 MG CAPS Take 1 capsule by mouth daily with breakfast.  . furosemide (LASIX) 40 MG tablet Take 1 tablet (40 mg total) by mouth daily.  . hydrocortisone (PROCTOCORT) 1 % CREA Apply to area BID PRN  . levothyroxine (SYNTHROID, LEVOTHROID) 137 MCG tablet Take 1 tablet (137 mcg total) by mouth daily. As directed  . lisinopril (PRINIVIL,ZESTRIL) 10 MG tablet One tablet daily  . Multiple Vitamins-Minerals (CENTRUM  SILVER) tablet Take 1 tablet by mouth daily.   . potassium chloride (KLOR-CON 10) 10 MEQ tablet Take 1 tablet (10 mEq total) by mouth daily.  . [DISCONTINUED] azithromycin (ZITHROMAX Z-PAK) 250 MG tablet As directed  . [DISCONTINUED] benzonatate (TESSALON) 100 MG capsule Take 1 capsule (100 mg total) by mouth 2 (two) times daily as needed for cough.    Review of Systems  Constitutional: Negative.   HENT: Negative.   Eyes: Negative.   Respiratory: Positive for cough.   Cardiovascular: Negative.   Gastrointestinal: Negative.        Abdominal cramping  Endocrine: Negative.   Genitourinary: Negative.   Musculoskeletal: Negative.   Skin: Negative.        Skin lesion - left lower leg  Allergic/Immunologic: Negative.   Neurological: Negative.   Hematological: Negative.   Psychiatric/Behavioral: Negative.        Objective:   Physical Exam  Constitutional: She is oriented to person, place, and time. She appears well-developed and well-nourished. No distress.  HENT:  Head: Normocephalic and atraumatic.  Right Ear: External ear normal.  Left Ear: External ear normal.  Nose: Nose normal.  Mouth/Throat: Oropharynx is clear and moist. No oropharyngeal exudate.  Eyes: Conjunctivae and EOM are normal. Pupils are equal, round, and reactive to light. Right eye exhibits no discharge. Left eye exhibits no discharge. No scleral icterus.  Neck: Normal range of motion. Neck supple. No thyromegaly present.  Cardiovascular: Normal rate, regular rhythm and  normal heart sounds.   No murmur heard. At 72/m  Pulmonary/Chest: Effort normal and breath sounds normal. No respiratory distress. She has no wheezes. She has no rales. She exhibits no tenderness.  Dry cough with no congestion  Abdominal: Soft. Bowel sounds are normal. She exhibits no mass. There is tenderness. There is no rebound and no guarding.  Obesity with slight right upper quadrant abdominal tenderness  Musculoskeletal: Normal range of  motion. She exhibits no edema.  Lymphadenopathy:    She has no cervical adenopathy.  Neurological: She is alert and oriented to person, place, and time. She has normal reflexes. No cranial nerve deficit.  Skin: Skin is warm and dry. Rash noted.  Cryotherapy was performed on the left forearm actinic keratosis. There was actinic keratosis of the left forearm and there was a flat lesion of the left lower leg without any drainage or coarseness to the rash.  Psychiatric: She has a normal mood and affect. Her behavior is normal. Judgment and thought content normal.  Nursing note and vitals reviewed.   BP 134/76 mmHg  Pulse 61  Temp(Src) 97.4 F (36.3 C) (Oral)  Ht '5\' 4"'  (1.626 m)  Wt 215 lb (97.523 kg)  BMI 36.89 kg/m2   [facility] reading (PRIMARY) by  Dr. Brunilda Payor x-ray and thoracic spine--   the heart is enlarged. There are degenerative changes in the thoracic spine. There is also thoracic aortic atherosclerosis. The patient was informed of this during the visit.                                    Assessment & Plan:  1. Vitamin B 12 deficiency - POCT CBC  2. Vitamin D deficiency - POCT CBC - Vit D  25 hydroxy (rtn osteoporosis monitoring)  3. Essential hypertension - POCT CBC - BMP8+EGFR - Hepatic function panel - NMR, lipoprofile  4. Hypothyroidism, unspecified hypothyroidism type - POCT CBC - Thyroid Panel With TSH  5. Abdominal pain, unspecified abdominal location - DG Chest 2 View; Future - DG Thoracic Spine 2 View; Future  6. Neuropathy - DG Chest 2 View; Future - DG Thoracic Spine 2 View; Future  7. Actinic keratosis -Cryotherapy without complication  8. Thoracic aortic atherosclerosis  9. Degenerative arthritis thoracic spine  Meds ordered this encounter  Medications  . lisinopril (PRINIVIL,ZESTRIL) 10 MG tablet    Sig: One tablet daily    Dispense:  90 tablet    Refill:  3     Patient Instructions                       Medicare Annual Wellness  Visit  [facility] and the medical providers at [facility] strive to bring you the best medical care.  In doing so we not only want to address your current medical conditions and concerns but also to detect new conditions early and prevent illness, disease and health-related problems.    Medicare offers a yearly Wellness Visit which allows our clinical staff to assess your need for preventative services including immunizations, lifestyle education, counseling to decrease risk of preventable diseases and screening for fall risk and other medical concerns.    This visit is provided free of charge (no copay) for all Medicare recipients. The clinical pharmacists at [facility] have begun to conduct these Wellness Visits which will also include a thorough review of all your  medications.    As you primary medical provider recommend that you make an appointment for your Annual Wellness Visit if you have not done so already this year.  You may set up this appointment before you leave today or you may call back [phone]) and schedule an appointment.  Please make sure when you call that you mention that you are scheduling your Annual Wellness Visit with the clinical pharmacist so that the appointment may be made for the proper length of time.       Continue current medications. Continue good therapeutic lifestyle changes which include good diet and exercise. Fall precautions discussed with patient. If an FOBT was given today- please return it to our front desk. If you are over 72 years old - you may need Prevnar 55 or the adult Pneumonia vaccine.  Flu Shots will be available at our office starting mid- September. Please call and schedule a FLU CLINIC APPOINTMENT.   We will call you with the results of the chest x-ray and thoracic spine films. If you continue to have problems with right-sided abdominal pain going through to your back, please call us back  and we will arrange to get an ultrasound or CT scan of your abdomen Continue to use Mucinex maximum strength, blue and white in color, 1 twice daily with a large glass of water as needed for cough and congestion Also use saline nose spray as directed Drink plenty of fluids and keep the house as cool as possible Continue to monitor blood pressure readings at home If the skin lesion on the left arm continues to give you problems please return to clinic and we will have this removed. Use Lamisil over-the-counter to skin lesion on left leg, apply lightly twice daily.   Arrie Senate MD

## 2014-03-04 NOTE — Patient Instructions (Addendum)
Medicare Annual Wellness Visit  [place] and the medical providers at [facility] strive to bring you the best medical care.  In doing so we not only want to address your current medical conditions and concerns but also to detect new conditions early and prevent illness, disease and health-related problems.    Medicare offers a yearly Wellness Visit which allows our clinical staff to assess your need for preventative services including immunizations, lifestyle education, counseling to decrease risk of preventable diseases and screening for fall risk and other medical concerns.    This visit is provided free of charge (no copay) for all Medicare recipients. The clinical pharmacists at [facility] have begun to conduct these Wellness Visits which will also include a thorough review of all your medications.    As you primary medical provider recommend that you make an appointment for your Annual Wellness Visit if you have not done so already this year.  You may set up this appointment before you leave today or you may call back [phone] and schedule an appointment.  Please make sure when you call that you mention that you are scheduling your Annual Wellness Visit with the clinical pharmacist so that the appointment may be made for the proper length of time.       Continue current medications. Continue good therapeutic lifestyle changes which include good diet and exercise. Fall precautions discussed with patient. If an FOBT was given today- please return it to our front desk. If you are over 78 years old - you may need Prevnar 13 or the adult Pneumonia vaccine.  Flu Shots will be available at our office starting mid- September. Please call and schedule a FLU CLINIC APPOINTMENT.   We will call you with the results of the chest x-ray and thoracic spine films. If you continue to have problems with right-sided abdominal pain  going through to your back, please call us back and we will arrange to get an ultrasound or CT scan of your abdomen Continue to use Mucinex maximum strength, blue and white in color, 1 twice daily with a large glass of water as needed for cough and congestion Also use saline nose spray as directed Drink plenty of fluids and keep the house as cool as possible Continue to monitor blood pressure readings at home If the skin lesion on the left arm continues to give you problems please return to clinic and we will have this removed. Use Lamisil over-the-counter to skin lesion on left leg, apply lightly twice daily.

## 2014-03-05 LAB — BMP8+EGFR
BUN/Creatinine Ratio: 20 (ref 11–26)
BUN: 22 mg/dL (ref 8–27)
CALCIUM: 9.9 mg/dL (ref 8.7–10.3)
CO2: 27 mmol/L (ref 18–29)
CREATININE: 1.1 mg/dL — AB (ref 0.57–1.00)
Chloride: 94 mmol/L — ABNORMAL LOW (ref 97–108)
GFR calc Af Amer: 55 mL/min/{1.73_m2} — ABNORMAL LOW (ref >59)
GFR, EST NON AFRICAN AMERICAN: 48 mL/min/{1.73_m2} — AB (ref >59)
Glucose: 97 mg/dL (ref 65–99)
Potassium: 4.6 mmol/L (ref 3.5–5.2)
Sodium: 141 mmol/L (ref 134–144)

## 2014-03-05 LAB — CBC WITH DIFFERENTIAL
BASOS: 1 %
Basophils Absolute: 0 10*3/uL (ref 0.0–0.2)
EOS ABS: 0.2 10*3/uL (ref 0.0–0.4)
Eos: 4 %
HCT: 38.2 % (ref 34.0–46.6)
HEMOGLOBIN: 12.6 g/dL (ref 11.1–15.9)
Immature Grans (Abs): 0 10*3/uL (ref 0.0–0.1)
Immature Granulocytes: 0 %
LYMPHS: 23 %
Lymphocytes Absolute: 1.4 10*3/uL (ref 0.7–3.1)
MCH: 30.4 pg (ref 26.6–33.0)
MCHC: 33 g/dL (ref 31.5–35.7)
MCV: 92 fL (ref 79–97)
MONOCYTES: 8 %
Monocytes Absolute: 0.5 10*3/uL (ref 0.1–0.9)
Neutrophils Absolute: 3.8 10*3/uL (ref 1.4–7.0)
Neutrophils Relative %: 64 %
Platelets: 255 10*3/uL (ref 150–379)
RBC: 4.14 x10E6/uL (ref 3.77–5.28)
RDW: 13.8 % (ref 12.3–15.4)
WBC: 5.9 10*3/uL (ref 3.4–10.8)

## 2014-03-05 LAB — HEPATIC FUNCTION PANEL
ALT: 8 IU/L (ref 0–32)
AST: 19 IU/L (ref 0–40)
Albumin: 4.9 g/dL — ABNORMAL HIGH (ref 3.5–4.8)
Alkaline Phosphatase: 97 IU/L (ref 39–117)
BILIRUBIN DIRECT: 0.13 mg/dL (ref 0.00–0.40)
Total Bilirubin: 0.6 mg/dL (ref 0.0–1.2)
Total Protein: 7.6 g/dL (ref 6.0–8.5)

## 2014-03-05 LAB — NMR, LIPOPROFILE
Cholesterol: 223 mg/dL — ABNORMAL HIGH (ref 100–199)
HDL CHOLESTEROL BY NMR: 61 mg/dL (ref >39)
HDL Particle Number: 42.9 umol/L (ref >=30.5)
LDL PARTICLE NUMBER: 1528 nmol/L — AB (ref <1000)
LDL Size: 20.3 nm (ref >20.5)
LDL-C: 122 mg/dL — ABNORMAL HIGH (ref 0–99)
LP-IR Score: 60 — ABNORMAL HIGH (ref <=45)
SMALL LDL PARTICLE NUMBER: 574 nmol/L — AB (ref <=527)
TRIGLYCERIDES BY NMR: 199 mg/dL — AB (ref 0–149)

## 2014-03-05 LAB — THYROID PANEL WITH TSH
FREE THYROXINE INDEX: 2.9 (ref 1.2–4.9)
T3 UPTAKE RATIO: 31 % (ref 24–39)
T4, Total: 9.3 ug/dL (ref 4.5–12.0)
TSH: 1.23 u[IU]/mL (ref 0.450–4.500)

## 2014-03-05 LAB — VITAMIN D 25 HYDROXY (VIT D DEFICIENCY, FRACTURES): Vit D, 25-Hydroxy: 51.5 ng/mL (ref 30.0–100.0)

## 2014-03-11 ENCOUNTER — Telehealth: Payer: Self-pay | Admitting: Family Medicine

## 2014-03-11 NOTE — Telephone Encounter (Signed)
Scheduled for pharmacy here on 03-19-14.

## 2014-03-18 ENCOUNTER — Ambulatory Visit (INDEPENDENT_AMBULATORY_CARE_PROVIDER_SITE_OTHER): Payer: Medicare Other | Admitting: *Deleted

## 2014-03-18 DIAGNOSIS — E538 Deficiency of other specified B group vitamins: Secondary | ICD-10-CM

## 2014-03-18 NOTE — Progress Notes (Signed)
Pt given B12 injection IM left deltoid. Pt tolerated injection well. 

## 2014-03-18 NOTE — Patient Instructions (Signed)

## 2014-03-19 ENCOUNTER — Encounter: Payer: Self-pay | Admitting: Pharmacist

## 2014-03-19 ENCOUNTER — Ambulatory Visit (INDEPENDENT_AMBULATORY_CARE_PROVIDER_SITE_OTHER): Payer: Medicare Other | Admitting: Pharmacist

## 2014-03-19 VITALS — BP 132/74 | HR 77 | Ht 64.0 in | Wt 214.0 lb

## 2014-03-19 DIAGNOSIS — E785 Hyperlipidemia, unspecified: Secondary | ICD-10-CM
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
## 2014-03-30 ENCOUNTER — Ambulatory Visit (INDEPENDENT_AMBULATORY_CARE_PROVIDER_SITE_OTHER): Payer: Medicare Other | Admitting: Physician Assistant

## 2014-03-30 VITALS — BP 174/76 | HR 62 | Temp 97.4°F | Ht 64.0 in | Wt 218.6 lb

## 2014-03-30 DIAGNOSIS — R202 Paresthesia of skin: Secondary | ICD-10-CM

## 2014-03-30 DIAGNOSIS — R200 Anesthesia of skin: Secondary | ICD-10-CM

## 2014-03-30 NOTE — Progress Notes (Signed)
Subjective:     Patient ID: [name], female   DOB: 1932/09/05, 80 y.o.   MRN: [id number]  HPI Pt with tingling to the entire body since taking Mucinex and the pharmacy changed her potassium   Review of Systems  Constitutional: Negative.   HENT: Positive for congestion, postnasal drip, rhinorrhea and voice change.   Respiratory: Positive for cough.        Objective:   Physical Exam  Constitutional: She appears well-developed and well-nourished.  HENT:  Right Ear: External ear normal.  Left Ear: External ear normal.  Mouth/Throat: Oropharynx is clear and moist.  Neck: Neck supple.  Cardiovascular: Normal rate, regular rhythm and normal heart sounds.   Pulmonary/Chest: Effort normal and breath sounds normal.  Lymphadenopathy:    She has no cervical adenopathy.  Nursing note and vitals reviewed.      Assessment:     Numbness/Tingling    Plan:     BMP done today Hold Mucinex and K today Will inform of lab results F/U pending labs

## 2014-03-31 LAB — BMP8+EGFR
BUN/Creatinine Ratio: 24 (ref 11–26)
BUN: 23 mg/dL (ref 8–27)
CHLORIDE: 95 mmol/L — AB (ref 97–108)
CO2: 31 mmol/L — AB (ref 18–29)
Calcium: 10 mg/dL (ref 8.7–10.3)
Creatinine, Ser: 0.97 mg/dL (ref 0.57–1.00)
GFR calc Af Amer: 64 mL/min/{1.73_m2} (ref >59)
GFR calc non Af Amer: 56 mL/min/{1.73_m2} — ABNORMAL LOW (ref >59)
Glucose: 122 mg/dL — ABNORMAL HIGH (ref 65–99)
POTASSIUM: 4.4 mmol/L (ref 3.5–5.2)
Sodium: 140 mmol/L (ref 134–144)

## 2014-04-19 ENCOUNTER — Ambulatory Visit (INDEPENDENT_AMBULATORY_CARE_PROVIDER_SITE_OTHER): Payer: Medicaid Other | Admitting: *Deleted

## 2014-04-19 DIAGNOSIS — E538 Deficiency of other specified B group vitamins: Secondary | ICD-10-CM

## 2014-04-19 NOTE — Patient Instructions (Signed)

## 2014-04-19 NOTE — Progress Notes (Signed)
Vitamin b12 injection given and tolerated well.  

## 2014-05-21 ENCOUNTER — Ambulatory Visit (INDEPENDENT_AMBULATORY_CARE_PROVIDER_SITE_OTHER): Payer: Medicare Other | Admitting: *Deleted

## 2014-05-21 DIAGNOSIS — E538 Deficiency of other specified B group vitamins: Secondary | ICD-10-CM

## 2014-05-21 NOTE — Patient Instructions (Signed)

## 2014-05-21 NOTE — Progress Notes (Signed)
Pt given B12 injection IM left deltoid, pt tolerated injection well. 

## 2014-06-07 DIAGNOSIS — F331 Major depressive disorder, recurrent, moderate: Secondary | ICD-10-CM | POA: Insufficient documentation

## 2014-06-22 ENCOUNTER — Ambulatory Visit (INDEPENDENT_AMBULATORY_CARE_PROVIDER_SITE_OTHER): Payer: Medicare Other | Admitting: *Deleted

## 2014-06-22 DIAGNOSIS — E538 Deficiency of other specified B group vitamins: Secondary | ICD-10-CM | POA: Diagnosis not present

## 2014-06-22 NOTE — Progress Notes (Signed)
Patient tolerated well.

## 2014-06-22 NOTE — Patient Instructions (Signed)

## 2014-07-13 ENCOUNTER — Encounter: Payer: Self-pay | Admitting: Family Medicine

## 2014-07-13 ENCOUNTER — Ambulatory Visit (INDEPENDENT_AMBULATORY_CARE_PROVIDER_SITE_OTHER): Payer: Medicare Other | Admitting: Family Medicine

## 2014-07-13 VITALS — BP 140/78 | HR 59 | Temp 97.8°F | Ht 64.0 in | Wt 222.0 lb

## 2014-07-13 DIAGNOSIS — I10 Essential (primary) hypertension: Secondary | ICD-10-CM

## 2014-07-13 DIAGNOSIS — F4322 Adjustment disorder with anxiety: Secondary | ICD-10-CM | POA: Diagnosis not present

## 2014-07-13 DIAGNOSIS — E559 Vitamin D deficiency, unspecified: Secondary | ICD-10-CM

## 2014-07-13 DIAGNOSIS — E538 Deficiency of other specified B group vitamins: Secondary | ICD-10-CM | POA: Diagnosis not present

## 2014-07-13 DIAGNOSIS — E039 Hypothyroidism, unspecified: Secondary | ICD-10-CM

## 2014-07-13 DIAGNOSIS — I700 Atherosclerosis of aorta: Secondary | ICD-10-CM

## 2014-07-13 DIAGNOSIS — E785 Hyperlipidemia, unspecified: Secondary | ICD-10-CM

## 2014-07-13 LAB — POCT CBC
Granulocyte percent: 63.2 %G (ref 37–80)
HCT, POC: 35.5 % — AB (ref 37.7–47.9)
Hemoglobin: 11.1 g/dL — AB (ref 12.2–16.2)
LYMPH, POC: 1.6 (ref 0.6–3.4)
MCH, POC: 29.4 pg (ref 27–31.2)
MCHC: 31.2 g/dL — AB (ref 31.8–35.4)
MCV: 94.2 fL (ref 80–97)
MPV: 7.3 fL (ref 0–99.8)
POC Granulocyte: 3.5 (ref 2–6.9)
POC LYMPH PERCENT: 27.9 %L (ref 10–50)
Platelet Count, POC: 249 10*3/uL (ref 142–424)
RBC: 3.76 M/uL — AB (ref 4.04–5.48)
RDW, POC: 15.2 %
WBC: 5.6 10*3/uL (ref 4.6–10.2)

## 2014-07-13 MED ORDER — LORAZEPAM 0.5 MG PO TABS: ORAL_TABLET | ORAL | Status: DC

## 2014-07-13 MED ORDER — POTASSIUM CHLORIDE ER 10 MEQ PO TBCR: 10.0000 meq | EXTENDED_RELEASE_TABLET | Freq: Every day | ORAL | Status: DC

## 2014-07-13 NOTE — Patient Instructions (Addendum)
Medicare Annual Wellness Visit  [facility] and the medical providers at [facility] strive to bring you the best medical care.  In doing so we not only want to address your current medical conditions and concerns but also to detect new conditions early and prevent illness, disease and health-related problems.    Medicare offers a yearly Wellness Visit which allows our clinical staff to assess your need for preventative services including immunizations, lifestyle education, counseling to decrease risk of preventable diseases and screening for fall risk and other medical concerns.    This visit is provided free of charge (no copay) for all Medicare recipients. The clinical pharmacists at [facility] have begun to conduct these Wellness Visits which will also include a thorough review of all your medications.    As you primary medical provider recommend that you make an appointment for your Annual Wellness Visit if you have not done so already this year.  You may set up this appointment before you leave today or you may call back [phone] and schedule an appointment.  Please make sure when you call that you mention that you are scheduling your Annual Wellness Visit with the clinical pharmacist so that the appointment may be made for the proper length of time.     Continue current medications. Continue good therapeutic lifestyle changes which include good diet and exercise. Fall precautions discussed with patient. If an FOBT was given today- please return it to our front desk. If you are over 79 years old - you may need Prevnar 13 or the adult Pneumonia vaccine.  Flu Shots are still available at our office. If you still haven't had one please call to set up a nurse visit to get one.   After your visit with us today you will receive a survey in the mail or online from [employer] regarding your care with us. Please take a moment to  fill this out. Your feedback is very important to us as you can help us better understand your patient needs as well as improve your experience and satisfaction. WE CARE ABOUT YOU!!!   We will increase your lisinopril back to 20 mg from 10 mg daily. Be sure and take this regularly and come by the office and let the nurse check your blood pressure in about 4 weeks Bring any outside blood pressure readings with you when you come Continue to watch her salt intake as closely as possible Try to get some more exercise and build up your endurance from the winter months of not being as active Continue to drink plenty of fluids

## 2014-07-13 NOTE — Progress Notes (Signed)
Subjective:    Patient ID: [name], female    DOB: 10/14/1932, 81 y.o.   MRN: [id number]  HPI Pt here for follow up and management of chronic medical problems which includes hypertension, hypothyroid and hyperlipidemia. She is taking medications regularly. The patient still has occasional headaches and anxiety problems she has had in the past. She is requesting a refill on her potassium. She is due to get her mammogram. And we will arrange this. Her lab work is due and we will get it done today also. She denies chest pain shortness of breath other than being out of shape, GI symptoms or voiding symptoms. She indicates that she plans to get involved with more activity and to try to get her strength back from the winter months. She also is dealing with some personal issues that continue to manifest themselves in her life and is trying to resolve these issues with the people that she has had them with i.e. her former husband.        Patient Active Problem List   Diagnosis Date Noted  . Thoracic aorta atherosclerosis 03/04/2014  . Degenerative arthritis of thoracic spine 03/04/2014  . Osteoporosis, post-menopausal 08/12/2013  . Metabolic syndrome 63/84/6659  . Anemia, iron deficiency 03/16/2013  . Vitamin D deficiency 03/16/2013  . HTN (hypertension) 03/16/2013  . hypothyroidism   . Urinary tract infection, site not specified   . Diaphragmatic hernia without mention of obstruction or gangrene   . Esophagitis   . hyperlipidemia   . Prolapse of vaginal walls without mention of uterine prolapse   . Arthritis   . First degree atrioventricular block   . Symptomatic menopausal or female climacteric states   . AF (atrial fibrillation)   . Rectal polyp   . Gastric polyp    Outpatient Encounter Prescriptions as of 07/13/2014  Medication Sig  . calcium-vitamin D (OSCAL WITH D) 500-200 MG-UNIT per tablet Take 1 tablet by mouth daily.  . Cholecalciferol (VITAMIN D3) 2000 UNITS TABS Take 1  tablet by mouth daily.    . ferrous fumarate-iron polysaccharide complex (TANDEM) 162-115.2 MG CAPS Take 1 capsule by mouth daily with breakfast.  . furosemide (LASIX) 40 MG tablet Take 1 tablet (40 mg total) by mouth daily.  . hydrocortisone (PROCTOCORT) 1 % CREA Apply to area BID PRN  . levothyroxine (SYNTHROID, LEVOTHROID) 137 MCG tablet Take 1 tablet (137 mcg total) by mouth daily. As directed  . lisinopril (PRINIVIL,ZESTRIL) 10 MG tablet One tablet daily  . Multiple Vitamins-Minerals (CENTRUM SILVER) tablet Take 1 tablet by mouth daily.   . potassium chloride (KLOR-CON 10) 10 MEQ tablet Take 1 tablet (10 mEq total) by mouth daily.    Review of Systems  Constitutional: Negative.   Eyes: Negative.   Respiratory: Negative.   Cardiovascular: Negative.   Gastrointestinal: Negative.   Endocrine: Negative.   Genitourinary: Negative.   Musculoskeletal: Negative.   Skin: Negative.   Allergic/Immunologic: Negative.   Neurological: Positive for headaches (off/ on ).  Hematological: Negative.   Psychiatric/Behavioral: The patient is nervous/anxious (off/ on ).        Objective:   Physical Exam  Constitutional: She is oriented to person, place, and time. She appears well-developed and well-nourished. No distress.  HENT:  Head: Normocephalic and atraumatic.  Right Ear: External ear normal.  Left Ear: External ear normal.  Nose: Nose normal.  Mouth/Throat: Oropharynx is clear and moist.  Eyes: Conjunctivae and EOM are normal. Pupils are equal, round, and reactive to light.  Right eye exhibits no discharge. Left eye exhibits no discharge. No scleral icterus.  Neck: Normal range of motion. Neck supple. No thyromegaly present.  Without bruits or anterior cervical adenopathy  Cardiovascular: Normal rate, regular rhythm, normal heart sounds and intact distal pulses.   No murmur heard. The rhythm is regular at 72/m  Pulmonary/Chest: Effort normal and breath sounds normal. No respiratory  distress. She has no wheezes. She has no rales. She exhibits no tenderness.  Rare wheeze otherwise clear anteriorly and posteriorly  Abdominal: Soft. Bowel sounds are normal. She exhibits no mass. There is tenderness. There is no rebound and no guarding.  Obesity with generalized abdominal tenderness  Musculoskeletal: Normal range of motion. She exhibits no edema or tenderness.  Lower extremity and ankle varicosities  Lymphadenopathy:    She has no cervical adenopathy.  Neurological: She is alert and oriented to person, place, and time. She has normal reflexes. No cranial nerve deficit.  Skin: Skin is warm and dry. No rash noted.  Psychiatric: She has a normal mood and affect. Her behavior is normal. Judgment and thought content normal.  Nursing note and vitals reviewed.  BP 140/78 mmHg  Pulse 59  Temp(Src) 97.8 F (36.6 C) (Oral)  Ht _0  (1.626 m)  Wt 222 lb (100.699 kg)  BMI 38.09 kg/m2  Repeat blood pressure 148/78      Assessment & Plan:  1. Vitamin B 12 deficiency -We will continue to monitor this and make any changes in treatment pending results of lab work - POCT CBC  2. Vitamin D deficiency -The patient should continue with current treatment pending results of lab work - POCT CBC - Vit D  25 hydroxy (rtn osteoporosis monitoring)  3. Essential hypertension -Because the blood pressure was high from home readings and high in the office today her lisinopril will be increased to 20 mg from 10 mg. She apparently was on this in the past and somehow this got switched back to 10 mg daily. - POCT CBC - BMP8+EGFR - Hepatic function panel  4. Hypothyroidism, unspecified hypothyroidism type -She should continue with current thyroid treatment pending results of lab work - POCT CBC  5. Dyslipidemia -She has a history of elevated cholesterol but is currently not taking any statin drug and she should at this time continue to watch her diet as closely as possible and get as much  exercise as possible. - POCT CBC - NMR, lipoprofile  6. Thoracic aorta atherosclerosis -We will continue to monitor this and the patient should do her best watching her cholesterol in her diet and getting as much exercise as possible. - POCT CBC - BMP8+EGFR - Hepatic function panel - NMR, lipoprofile  7. Adjustment disorder with anxious mood -The patient being divorced and living by herself is dealing with a lot of personal stress at home. She is requesting something to help with the episodes of anxiety which she periodically has. - LORazepam (ATIVAN) 0.5 MG tablet; One half tablet to one daily if needed for anxiety  Dispense: 30 tablet; Refill: 1  Meds ordered this encounter  Medications  . potassium chloride (KLOR-CON 10) 10 MEQ tablet    Sig: Take 1 tablet (10 mEq total) by mouth daily.    Dispense:  90 tablet    Refill:  3  . LORazepam (ATIVAN) 0.5 MG tablet    Sig: One half tablet to one daily if needed for anxiety    Dispense:  30 tablet    Refill:  1  Patient Instructions                       Medicare Annual Wellness Visit  [facility] and the medical providers at [facility] strive to bring you the best medical care.  In doing so we not only want to address your current medical conditions and concerns but also to detect new conditions early and prevent illness, disease and health-related problems.    Medicare offers a yearly Wellness Visit which allows our clinical staff to assess your need for preventative services including immunizations, lifestyle education, counseling to decrease risk of preventable diseases and screening for fall risk and other medical concerns.    This visit is provided free of charge (no copay) for all Medicare recipients. The clinical pharmacists at [facility] have begun to conduct these Wellness Visits which will also include a thorough review of all your medications.    As you primary medical  provider recommend that you make an appointment for your Annual Wellness Visit if you have not done so already this year.  You may set up this appointment before you leave today or you may call back ([phone]) and schedule an appointment.  Please make sure when you call that you mention that you are scheduling your Annual Wellness Visit with the clinical pharmacist so that the appointment may be made for the proper length of time.     Continue current medications. Continue good therapeutic lifestyle changes which include good diet and exercise. Fall precautions discussed with patient. If an FOBT was given today- please return it to our front desk. If you are over 12 years old - you may need Prevnar 32 or the adult Pneumonia vaccine.  Flu Shots are still available at our office. If you still haven't had one please call to set up a nurse visit to get one.   After your visit with [place] today you will receive a survey in the mail or online from [employer] regarding your care with [place]. Please take a moment to fill this out. Your feedback is very important to [place] as you can help [place] better understand your patient needs as well as improve your experience and satisfaction. WE CARE ABOUT YOU!!!   We will increase your lisinopril back to 20 mg from 10 mg daily. Be sure and take this regularly and come by the office and let the nurse check your blood pressure in about 4 weeks Bring any outside blood pressure readings with you when you come Continue to watch her salt intake as closely as possible Try to get some more exercise and build up your endurance from the winter months of not being as active Continue to drink plenty of fluids   Arrie Senate MD

## 2014-07-14 LAB — HEPATIC FUNCTION PANEL
ALT: 10 IU/L (ref 0–32)
AST: 20 IU/L (ref 0–40)
Albumin: 4.4 g/dL (ref 3.5–4.7)
Alkaline Phosphatase: 99 IU/L (ref 39–117)
Bilirubin Total: 0.7 mg/dL (ref 0.0–1.2)
Bilirubin, Direct: 0.15 mg/dL (ref 0.00–0.40)
Total Protein: 7.2 g/dL (ref 6.0–8.5)

## 2014-07-14 LAB — BMP8+EGFR
BUN / CREAT RATIO: 20 (ref 11–26)
BUN: 21 mg/dL (ref 8–27)
CALCIUM: 9.9 mg/dL (ref 8.7–10.3)
CHLORIDE: 98 mmol/L (ref 97–108)
CO2: 28 mmol/L (ref 18–29)
CREATININE: 1.03 mg/dL — AB (ref 0.57–1.00)
GFR calc Af Amer: 59 mL/min/{1.73_m2} — ABNORMAL LOW (ref >59)
GFR, EST NON AFRICAN AMERICAN: 51 mL/min/{1.73_m2} — AB (ref >59)
Glucose: 100 mg/dL — ABNORMAL HIGH (ref 65–99)
Potassium: 4.4 mmol/L (ref 3.5–5.2)
SODIUM: 143 mmol/L (ref 134–144)

## 2014-07-14 LAB — NMR, LIPOPROFILE
Cholesterol: 203 mg/dL — ABNORMAL HIGH (ref 100–199)
HDL Cholesterol by NMR: 63 mg/dL (ref >39)
HDL Particle Number: 37.1 umol/L (ref >=30.5)
LDL Particle Number: 1102 nmol/L — ABNORMAL HIGH (ref <1000)
LDL Size: 21 nm (ref >20.5)
LDL-C: 111 mg/dL — ABNORMAL HIGH (ref 0–99)
LP-IR Score: 46 — ABNORMAL HIGH (ref <=45)
Small LDL Particle Number: 420 nmol/L (ref <=527)
Triglycerides by NMR: 145 mg/dL (ref 0–149)

## 2014-07-14 LAB — VITAMIN D 25 HYDROXY (VIT D DEFICIENCY, FRACTURES): Vit D, 25-Hydroxy: 53 ng/mL (ref 30.0–100.0)

## 2014-07-23 ENCOUNTER — Ambulatory Visit (INDEPENDENT_AMBULATORY_CARE_PROVIDER_SITE_OTHER): Payer: Medicare Other | Admitting: *Deleted

## 2014-07-23 DIAGNOSIS — E538 Deficiency of other specified B group vitamins: Secondary | ICD-10-CM

## 2014-07-23 NOTE — Patient Instructions (Signed)

## 2014-07-23 NOTE — Progress Notes (Signed)
Vitamin b12 given and tolerated well. 

## 2014-07-28 ENCOUNTER — Ambulatory Visit (HOSPITAL_COMMUNITY): Payer: Medicare Other | Admitting: Psychology

## 2014-07-28 ENCOUNTER — Ambulatory Visit (INDEPENDENT_AMBULATORY_CARE_PROVIDER_SITE_OTHER): Payer: Medicare Other | Admitting: Psychology

## 2014-07-28 DIAGNOSIS — F331 Major depressive disorder, recurrent, moderate: Secondary | ICD-10-CM

## 2014-08-05 ENCOUNTER — Other Ambulatory Visit: Payer: Self-pay | Admitting: Family Medicine

## 2014-08-05 MED ORDER — FUROSEMIDE 40 MG PO TABS: 40.0000 mg | ORAL_TABLET | Freq: Every day | ORAL | Status: DC

## 2014-08-05 NOTE — Telephone Encounter (Signed)
done

## 2014-08-10 ENCOUNTER — Ambulatory Visit (INDEPENDENT_AMBULATORY_CARE_PROVIDER_SITE_OTHER): Payer: Medicare Other | Admitting: *Deleted

## 2014-08-10 VITALS — BP 128/65 | HR 60

## 2014-08-10 DIAGNOSIS — I10 Essential (primary) hypertension: Secondary | ICD-10-CM

## 2014-08-10 MED ORDER — LISINOPRIL 20 MG PO TABS: 20.0000 mg | ORAL_TABLET | Freq: Every day | ORAL | Status: DC

## 2014-08-10 NOTE — Patient Instructions (Signed)

## 2014-08-10 NOTE — Progress Notes (Signed)
Blood pressure good

## 2014-08-10 NOTE — Progress Notes (Signed)
Patient came in today for a BP recheck per Dr. Christell ConstantMoore. Patient's BP 128/65 mmHg  Pulse 60 Patient advised that I will send this reading over to Dr. Christell ConstantMoore to review and we would contact patient if any changes needed to be made. Patient also requested a refill on her lisinopril 20mg  to be sent to her mail order pharmacy and this was done for the patient also.

## 2014-08-23 ENCOUNTER — Ambulatory Visit (HOSPITAL_COMMUNITY): Payer: Medicare Other | Admitting: Psychology

## 2014-08-24 ENCOUNTER — Ambulatory Visit (INDEPENDENT_AMBULATORY_CARE_PROVIDER_SITE_OTHER): Payer: Medicare Other | Admitting: *Deleted

## 2014-08-24 DIAGNOSIS — E538 Deficiency of other specified B group vitamins: Secondary | ICD-10-CM

## 2014-08-24 NOTE — Progress Notes (Signed)
Pt given B12 injection IM right deltoid and tolerated well. 

## 2014-08-24 NOTE — Patient Instructions (Signed)

## 2014-09-10 ENCOUNTER — Ambulatory Visit (HOSPITAL_COMMUNITY): Payer: Self-pay | Admitting: Psychology

## 2014-09-22 ENCOUNTER — Ambulatory Visit (INDEPENDENT_AMBULATORY_CARE_PROVIDER_SITE_OTHER): Payer: No Typology Code available for payment source | Admitting: Psychology

## 2014-09-22 DIAGNOSIS — F331 Major depressive disorder, recurrent, moderate: Secondary | ICD-10-CM | POA: Diagnosis not present

## 2014-09-22 NOTE — Progress Notes (Signed)
    Hershal Coria, PsyD 09/22/2014

## 2014-09-27 ENCOUNTER — Ambulatory Visit (INDEPENDENT_AMBULATORY_CARE_PROVIDER_SITE_OTHER): Payer: Medicare Other | Admitting: *Deleted

## 2014-09-27 DIAGNOSIS — E538 Deficiency of other specified B group vitamins: Secondary | ICD-10-CM

## 2014-09-27 NOTE — Progress Notes (Signed)
Patient tolerated well.

## 2014-09-27 NOTE — Patient Instructions (Signed)

## 2014-10-15 ENCOUNTER — Telehealth: Payer: Self-pay | Admitting: Family Medicine

## 2014-10-15 MED ORDER — KLOR-CON 10 10 MEQ PO TBCR: 10.0000 meq | EXTENDED_RELEASE_TABLET | Freq: Every day | ORAL | Status: DC

## 2014-10-15 NOTE — Telephone Encounter (Signed)
Pt states mail order sent generic klor con and pt is intolerant to generic New Rx printed for pt pick up

## 2014-10-18 ENCOUNTER — Ambulatory Visit (INDEPENDENT_AMBULATORY_CARE_PROVIDER_SITE_OTHER): Payer: No Typology Code available for payment source | Admitting: Psychology

## 2014-10-18 DIAGNOSIS — F331 Major depressive disorder, recurrent, moderate: Secondary | ICD-10-CM
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
## 2014-10-28 ENCOUNTER — Ambulatory Visit (INDEPENDENT_AMBULATORY_CARE_PROVIDER_SITE_OTHER): Payer: Medicare Other

## 2014-10-28 DIAGNOSIS — E538 Deficiency of other specified B group vitamins: Secondary | ICD-10-CM | POA: Diagnosis not present

## 2014-10-28 NOTE — Progress Notes (Signed)
B12 administered to left deltoid, patient tolerated well

## 2014-10-28 NOTE — Patient Instructions (Signed)

## 2014-11-18 ENCOUNTER — Ambulatory Visit (INDEPENDENT_AMBULATORY_CARE_PROVIDER_SITE_OTHER): Payer: No Typology Code available for payment source | Admitting: Psychology

## 2014-11-18 DIAGNOSIS — F331 Major depressive disorder, recurrent, moderate: Secondary | ICD-10-CM | POA: Diagnosis not present

## 2014-11-18 NOTE — Progress Notes (Signed)
The patient returns today reporting that she has been doing better with regard to her depressive symptoms. However, she still continues to struggle with feelings of guilt and shame about past events. We continue to work on building coping skills around these issues and helping the patient identify ways that she can return to focus 2 more healthy cognitive interpretations and attributions.   Hershal Coria, PsyD 11/18/2014

## 2014-11-23 ENCOUNTER — Ambulatory Visit (INDEPENDENT_AMBULATORY_CARE_PROVIDER_SITE_OTHER): Payer: Medicare Other | Admitting: Family Medicine

## 2014-11-23 ENCOUNTER — Encounter: Payer: Self-pay | Admitting: Family Medicine

## 2014-11-23 VITALS — BP 150/75 | HR 50 | Temp 97.3°F | Ht 64.0 in | Wt 229.0 lb

## 2014-11-23 DIAGNOSIS — D509 Iron deficiency anemia, unspecified: Secondary | ICD-10-CM

## 2014-11-23 DIAGNOSIS — E8881 Metabolic syndrome: Secondary | ICD-10-CM | POA: Diagnosis not present

## 2014-11-23 DIAGNOSIS — E039 Hypothyroidism, unspecified: Secondary | ICD-10-CM | POA: Diagnosis not present

## 2014-11-23 DIAGNOSIS — I10 Essential (primary) hypertension: Secondary | ICD-10-CM | POA: Diagnosis not present

## 2014-11-23 DIAGNOSIS — I700 Atherosclerosis of aorta: Secondary | ICD-10-CM

## 2014-11-23 DIAGNOSIS — E538 Deficiency of other specified B group vitamins: Secondary | ICD-10-CM

## 2014-11-23 DIAGNOSIS — E559 Vitamin D deficiency, unspecified: Secondary | ICD-10-CM

## 2014-11-23 DIAGNOSIS — E785 Hyperlipidemia, unspecified: Secondary | ICD-10-CM | POA: Diagnosis not present

## 2014-11-23 DIAGNOSIS — E034 Atrophy of thyroid (acquired): Secondary | ICD-10-CM

## 2014-11-23 DIAGNOSIS — E038 Other specified hypothyroidism: Secondary | ICD-10-CM | POA: Diagnosis not present

## 2014-11-23 MED ORDER — FUROSEMIDE 40 MG PO TABS: 40.0000 mg | ORAL_TABLET | Freq: Every day | ORAL | Status: DC

## 2014-11-23 MED ORDER — LEVOTHYROXINE SODIUM 137 MCG PO TABS: 137.0000 ug | ORAL_TABLET | Freq: Every day | ORAL | Status: DC

## 2014-11-23 NOTE — Progress Notes (Signed)
Subjective:    Patient ID: [name], female    DOB: Dec 03, 1932, 81 y.o.   MRN: [id number]  HPI Pt here for follow up and management of chronic medical problems which includes hypertension, lipids, and hypothyroid. She is taking medications regularly. The patient denies chest pain. She does have some shortness of breath but no more than usual. She indicates that she is very very lax with exercising this summer and has put on too much weight. She promises to do better with this. She also complains of some issues with her hearing and we will make arrangements for her to see the audiologist again soon. She has not checked her blood pressures at home and she will try to do this more regularly. She denies problems with swallowing except occasionally has issues with this. She denies any heartburn indigestion nausea vomiting diarrhea or blood in the stool. She denies any problems with voiding.     \  Patient Active Problem List   Diagnosis Date Noted  . Thoracic aorta atherosclerosis 03/04/2014  . Degenerative arthritis of thoracic spine 03/04/2014  . Osteoporosis, post-menopausal 08/12/2013  . Metabolic syndrome 35/45/6256  . Anemia, iron deficiency 03/16/2013  . Vitamin D deficiency 03/16/2013  . HTN (hypertension) 03/16/2013  . hypothyroidism   . Urinary tract infection, site not specified   . Diaphragmatic hernia without mention of obstruction or gangrene   . Esophagitis   . hyperlipidemia   . Prolapse of vaginal walls without mention of uterine prolapse   . Arthritis   . First degree atrioventricular block   . Symptomatic menopausal or female climacteric states   . AF (atrial fibrillation)   . Rectal polyp   . Gastric polyp    Outpatient Encounter Prescriptions as of 11/23/2014  Medication Sig  . calcium-vitamin D (OSCAL WITH D) 500-200 MG-UNIT per tablet Take 1 tablet by mouth daily.  . Cholecalciferol (VITAMIN D3) 2000 UNITS TABS Take 1 tablet by mouth daily.    . ferrous  fumarate-iron polysaccharide complex (TANDEM) 162-115.2 MG CAPS Take 1 capsule by mouth daily with breakfast.  . furosemide (LASIX) 40 MG tablet Take 1 tablet (40 mg total) by mouth daily.  . hydrocortisone (PROCTOCORT) 1 % CREA Apply to area BID PRN  . KLOR-CON 10 10 MEQ tablet Take 1 tablet (10 mEq total) by mouth daily. Dispense Klor Con only  . levothyroxine (SYNTHROID, LEVOTHROID) 137 MCG tablet Take 1 tablet (137 mcg total) by mouth daily. As directed  . lisinopril (PRINIVIL,ZESTRIL) 20 MG tablet Take 1 tablet (20 mg total) by mouth daily.  Marland Kitchen LORazepam (ATIVAN) 0.5 MG tablet One half tablet to one daily if needed for anxiety  . Multiple Vitamins-Minerals (CENTRUM SILVER) tablet Take 1 tablet by mouth daily.    Facility-Administered Encounter Medications as of 11/23/2014  Medication  . cyanocobalamin ((VITAMIN B-12)) injection 1,000 mcg     Review of Systems  Constitutional: Negative.   HENT: Negative.   Eyes: Negative.   Respiratory: Negative.   Cardiovascular: Negative.   Gastrointestinal: Negative.   Endocrine: Negative.   Genitourinary: Negative.   Musculoskeletal: Positive for arthralgias.  Skin: Negative.   Allergic/Immunologic: Negative.   Neurological: Negative.   Hematological: Negative.   Psychiatric/Behavioral: Negative.        Objective:   Physical Exam  Constitutional: She is oriented to person, place, and time. She appears well-developed and well-nourished. No distress.  Pleasant and alert  HENT:  Head: Normocephalic and atraumatic.  Right Ear: External ear normal.  Left  Ear: External ear normal.  Nose: Nose normal.  Mouth/Throat: Oropharynx is clear and moist.  Ear canals are clear and she is using a hearing aid in the right ear.  Eyes: Conjunctivae and EOM are normal. Pupils are equal, round, and reactive to light. Right eye exhibits no discharge. Left eye exhibits no discharge. No scleral icterus.  Neck: Normal range of motion. Neck supple. No  thyromegaly present.  No carotid bruits anterior cervical adenopathy  Cardiovascular: Normal rate, regular rhythm, normal heart sounds and intact distal pulses.  Exam reveals no gallop and no friction rub.   No murmur heard. The patient does have a lot of varicosities in both feet and some pedal edema but good pulses. At 72/m with a regular rate and rhythm  Pulmonary/Chest: Effort normal and breath sounds normal. No respiratory distress. She has no wheezes. She has no rales. She exhibits no tenderness.  Abdominal: Soft. Bowel sounds are normal. She exhibits no mass. There is no tenderness. There is no rebound and no guarding.  Abdominal obesity without masses tenderness or organ enlargement  Musculoskeletal: Normal range of motion. She exhibits no edema or tenderness.  Lymphadenopathy:    She has no cervical adenopathy.  Neurological: She is alert and oriented to person, place, and time. She has normal reflexes. No cranial nerve deficit.  Skin: Skin is warm and dry. No rash noted.  Psychiatric: She has a normal mood and affect. Her behavior is normal. Judgment and thought content normal.  Nursing note and vitals reviewed.  BP 150/75 mmHg  Pulse 50  Temp(Src) 97.3 F (36.3 C) (Oral)  Ht _0  (1.626 m)  Wt 229 lb (103.874 kg)  BMI 39.29 kg/m2  Repeat blood pressure 138/88 right arm large cuff sitting      Assessment & Plan:  1. Essential hypertension -Continue current blood pressure medicine. Exercise more regularly. Watch sodium intake more closely. - BMP8+EGFR - Hepatic function panel - CBC with Differential/Platelet  2. Vitamin D deficiency -Continue current vitamin D pending results of lab work - Vit D  25 hydroxy (rtn osteoporosis monitoring) - CBC with Differential/Platelet  3. Hypothyroidism, unspecified hypothyroidism type -Continue current thyroid treatment pending results of lab work - CBC with Differential/Platelet  4. Thoracic aorta atherosclerosis -Continue  with aggressive diet habits and weight loss - CBC with Differential/Platelet  5. Dyslipidemia -Continue with weight loss and exercise - Lipid panel - CBC with Differential/Platelet  6. Vitamin B 12 deficiency -Continue with supplemental vitamins - CBC with Differential/Platelet  7. Anemia, iron deficiency -Continue with iron replacement pending results of lab work - CBC with Differential/Platelet  8. Hyperlipidemia -Continue with aggressive therapeutic lifestyle changes - CBC with Differential/Platelet  9. Hypothyroidism due to acquired atrophy of thyroid -Continue with current thyroid replacement - CBC with Differential/Platelet  10. Metabolic syndrome -Continue with aggressive therapeutic lifestyle changes and increase the walking and watch the sodium intake - CBC with Differential/Platelet  Meds ordered this encounter  Medications  . levothyroxine (SYNTHROID, LEVOTHROID) 137 MCG tablet    Sig: Take 1 tablet (137 mcg total) by mouth daily. As directed    Dispense:  90 tablet    Refill:  3  . furosemide (LASIX) 40 MG tablet    Sig: Take 1 tablet (40 mg total) by mouth daily.    Dispense:  90 tablet    Refill:  3   Patient Instructions  Medicare Annual Wellness Visit  [facility] and the medical providers at [facility] strive to bring you the best medical care.  In doing so we not only want to address your current medical conditions and concerns but also to detect new conditions early and prevent illness, disease and health-related problems.    Medicare offers a yearly Wellness Visit which allows our clinical staff to assess your need for preventative services including immunizations, lifestyle education, counseling to decrease risk of preventable diseases and screening for fall risk and other medical concerns.    This visit is provided free of charge (no copay) for all Medicare recipients. The clinical pharmacists at [facility] have begun to conduct these Wellness Visits which will also include a thorough review of all your medications.    As you primary medical provider recommend that you make an appointment for your Annual Wellness Visit if you have not done so already this year.  You may set up this appointment before you leave today or you may call back ([phone]) and schedule an appointment.  Please make sure when you call that you mention that you are scheduling your Annual Wellness Visit with the clinical pharmacist so that the appointment may be made for the proper length of time.     Continue current medications. Continue good therapeutic lifestyle changes which include good diet and exercise. Fall precautions discussed with patient. If an FOBT was given today- please return it to our front desk. If you are over 59 years old - you may need Prevnar 2 or the adult Pneumonia vaccine.  **Flu shots will be available soon--- please call and schedule a FLU-CLINIC appointment**  After your visit with [place] today you will receive a survey in the mail or online from [employer] regarding your care with [place]. Please take a moment to fill this out. Your feedback is very important to [place] as you can help [place] better understand your patient needs as well as improve your experience and satisfaction. WE CARE ABOUT YOU!!!   **Please join [place] SEPT.[email or web address], 2016 from 5:00 to 7:00pm for our OPEN HOUSE! Come out and meet our NEW providers**  Please get a good pair of support hose and pick these on your legs before you get out of bed in the mornings Watch your diet closely and drink more water Avoid sodium intake as much as possible If you continue to have problems with swallowing get back in touch with [place] and we will arrange for you to see a gastroenterologist to evaluate your esophagus further Return the FOBT Try to do more walking and exercising and work on the weight as much as possible Please make an appointment  to follow-up with the ophthalmologist about your eyes We have encouraged her to follow-up again with the audiologist and we will get her an appointment to see him.   Arrie Senate MD

## 2014-11-23 NOTE — Patient Instructions (Addendum)
Medicare Annual Wellness Visit  [facility] and the medical providers at [facility] strive to bring you the best medical care.  In doing so we not only want to address your current medical conditions and concerns but also to detect new conditions early and prevent illness, disease and health-related problems.    Medicare offers a yearly Wellness Visit which allows our clinical staff to assess your need for preventative services including immunizations, lifestyle education, counseling to decrease risk of preventable diseases and screening for fall risk and other medical concerns.    This visit is provided free of charge (no copay) for all Medicare recipients. The clinical pharmacists at [facility] have begun to conduct these Wellness Visits which will also include a thorough review of all your medications.    As you primary medical provider recommend that you make an appointment for your Annual Wellness Visit if you have not done so already this year.  You may set up this appointment before you leave today or you may call back ([phone]) and schedule an appointment.  Please make sure when you call that you mention that you are scheduling your Annual Wellness Visit with the clinical pharmacist so that the appointment may be made for the proper length of time.     Continue current medications. Continue good therapeutic lifestyle changes which include good diet and exercise. Fall precautions discussed with patient. If an FOBT was given today- please return it to our front desk. If you are over 74 years old - you may need Prevnar 13 or the adult Pneumonia vaccine.  **Flu shots will be available soon--- please call and schedule a FLU-CLINIC appointment**  After your visit with [place] today you will receive a survey in the mail or online from [employer] regarding your care with [place]. Please take a moment to fill this out. Your feedback is  very important to [place] as you can help [place] better understand your patient needs as well as improve your experience and satisfaction. WE CARE ABOUT YOU!!!   **Please join [place] SEPT.[id number], 2016 from 5:00 to 7:00pm for our OPEN HOUSE! Come out and meet our NEW providers**  Please get a good pair of support hose and pick these on your legs before you get out of bed in the mornings Watch your diet closely and drink more water Avoid sodium intake as much as possible If you continue to have problems with swallowing get back in touch with [place] and we will arrange for you to see a gastroenterologist to evaluate your esophagus further Return the FOBT Try to do more walking and exercising and work on the weight as much as possible Please make an appointment to follow-up with the ophthalmologist about your eyes We have encouraged her to follow-up again with the audiologist and we will get her an appointment to see him.

## 2014-11-24 LAB — LIPID PANEL
CHOLESTEROL TOTAL: 227 mg/dL — AB (ref 100–199)
Chol/HDL Ratio: 4.1 ratio units (ref 0.0–4.4)
HDL: 56 mg/dL (ref >39)
LDL CALC: 131 mg/dL — AB (ref 0–99)
TRIGLYCERIDES: 202 mg/dL — AB (ref 0–149)
VLDL Cholesterol Cal: 40 mg/dL (ref 5–40)

## 2014-11-24 LAB — CBC WITH DIFFERENTIAL/PLATELET
BASOS: 1 %
Basophils Absolute: 0 10*3/uL (ref 0.0–0.2)
EOS (ABSOLUTE): 0.4 10*3/uL (ref 0.0–0.4)
EOS: 6 %
HEMATOCRIT: 35 % (ref 34.0–46.6)
HEMOGLOBIN: 11.5 g/dL (ref 11.1–15.9)
IMMATURE GRANS (ABS): 0 10*3/uL (ref 0.0–0.1)
Immature Granulocytes: 0 %
LYMPHS: 23 %
Lymphocytes Absolute: 1.4 10*3/uL (ref 0.7–3.1)
MCH: 30.2 pg (ref 26.6–33.0)
MCHC: 32.9 g/dL (ref 31.5–35.7)
MCV: 92 fL (ref 79–97)
MONOCYTES: 10 %
Monocytes Absolute: 0.6 10*3/uL (ref 0.1–0.9)
NEUTROS ABS: 3.6 10*3/uL (ref 1.4–7.0)
Neutrophils: 60 %
Platelets: 224 10*3/uL (ref 150–379)
RBC: 3.81 x10E6/uL (ref 3.77–5.28)
RDW: 14.9 % (ref 12.3–15.4)
WBC: 6 10*3/uL (ref 3.4–10.8)

## 2014-11-24 LAB — HEPATIC FUNCTION PANEL
ALT: 11 IU/L (ref 0–32)
AST: 20 IU/L (ref 0–40)
Albumin: 4.5 g/dL (ref 3.5–4.7)
Alkaline Phosphatase: 87 IU/L (ref 39–117)
Bilirubin Total: 0.5 mg/dL (ref 0.0–1.2)
Bilirubin, Direct: 0.13 mg/dL (ref 0.00–0.40)
TOTAL PROTEIN: 7.2 g/dL (ref 6.0–8.5)

## 2014-11-24 LAB — BMP8+EGFR
BUN/Creatinine Ratio: 18 (ref 11–26)
BUN: 18 mg/dL (ref 8–27)
CHLORIDE: 96 mmol/L — AB (ref 97–108)
CO2: 27 mmol/L (ref 18–29)
CREATININE: 1.01 mg/dL — AB (ref 0.57–1.00)
Calcium: 10.1 mg/dL (ref 8.7–10.3)
GFR calc Af Amer: 61 mL/min/{1.73_m2} (ref >59)
GFR calc non Af Amer: 53 mL/min/{1.73_m2} — ABNORMAL LOW (ref >59)
GLUCOSE: 86 mg/dL (ref 65–99)
Potassium: 4.5 mmol/L (ref 3.5–5.2)
SODIUM: 142 mmol/L (ref 134–144)

## 2014-11-24 LAB — THYROID PANEL WITH TSH
FREE THYROXINE INDEX: 3.2 (ref 1.2–4.9)
T3 Uptake Ratio: 31 % (ref 24–39)
T4, Total: 10.2 ug/dL (ref 4.5–12.0)
TSH: 0.669 u[IU]/mL (ref 0.450–4.500)

## 2014-11-24 LAB — VITAMIN D 25 HYDROXY (VIT D DEFICIENCY, FRACTURES): Vit D, 25-Hydroxy: 43.4 ng/mL (ref 30.0–100.0)

## 2014-11-24 NOTE — Addendum Note (Signed)
Addended by: Tamera Punt on: 11/24/2014 11:07 AM   Modules accepted: Orders

## 2014-11-29 ENCOUNTER — Ambulatory Visit (INDEPENDENT_AMBULATORY_CARE_PROVIDER_SITE_OTHER): Payer: Medicare Other | Admitting: *Deleted

## 2014-11-29 DIAGNOSIS — E538 Deficiency of other specified B group vitamins: Secondary | ICD-10-CM

## 2014-11-29 NOTE — Patient Instructions (Signed)

## 2014-11-29 NOTE — Progress Notes (Signed)
Vitamin b12 injection given and patient tolerated well.  

## 2014-12-16 ENCOUNTER — Ambulatory Visit (INDEPENDENT_AMBULATORY_CARE_PROVIDER_SITE_OTHER): Payer: No Typology Code available for payment source | Admitting: Psychology

## 2014-12-16 ENCOUNTER — Encounter (HOSPITAL_COMMUNITY): Payer: Self-pay | Admitting: Psychology

## 2014-12-16 DIAGNOSIS — F331 Major depressive disorder, recurrent, moderate: Secondary | ICD-10-CM

## 2014-12-16 NOTE — Progress Notes (Signed)
The patient  returns today reporting that she has been doing better recently. The patient reports that her depression has been continuing but has been improving. The patient reports that she still has a lot of self-esteem issues and does have times where she can't stop thinking about things that may be contributing to her negative self thinking. Today we continue to work on building coping skills and strategies around her depression and anxiety. Hershal Coria, PsyD 12/16/2014

## 2014-12-31 ENCOUNTER — Ambulatory Visit (INDEPENDENT_AMBULATORY_CARE_PROVIDER_SITE_OTHER): Payer: Medicare Other | Admitting: *Deleted

## 2014-12-31 DIAGNOSIS — D509 Iron deficiency anemia, unspecified: Secondary | ICD-10-CM

## 2014-12-31 DIAGNOSIS — E538 Deficiency of other specified B group vitamins: Secondary | ICD-10-CM | POA: Diagnosis not present

## 2015-01-04 ENCOUNTER — Ambulatory Visit (INDEPENDENT_AMBULATORY_CARE_PROVIDER_SITE_OTHER): Payer: No Typology Code available for payment source | Admitting: Psychology

## 2015-01-04 DIAGNOSIS — F331 Major depressive disorder, recurrent, moderate: Secondary | ICD-10-CM | POA: Diagnosis not present

## 2015-01-05 ENCOUNTER — Ambulatory Visit: Payer: Medicare Other

## 2015-01-06 ENCOUNTER — Encounter: Payer: Self-pay | Admitting: Family Medicine

## 2015-01-06 ENCOUNTER — Ambulatory Visit (INDEPENDENT_AMBULATORY_CARE_PROVIDER_SITE_OTHER): Payer: Medicare Other | Admitting: Family Medicine

## 2015-01-06 VITALS — BP 140/63 | HR 58 | Temp 97.6°F | Ht 64.0 in | Wt 233.4 lb

## 2015-01-06 DIAGNOSIS — Z23 Encounter for immunization: Secondary | ICD-10-CM

## 2015-01-06 DIAGNOSIS — H9193 Unspecified hearing loss, bilateral: Secondary | ICD-10-CM

## 2015-01-06 DIAGNOSIS — H9190 Unspecified hearing loss, unspecified ear: Secondary | ICD-10-CM | POA: Insufficient documentation

## 2015-01-06 DIAGNOSIS — Z0000 Encounter for general adult medical examination without abnormal findings: Secondary | ICD-10-CM | POA: Diagnosis not present

## 2015-01-06 NOTE — Patient Instructions (Signed)
Great to meet you!  Come back to see Dr. Christell Constant like usual.   Try to elevate your feet for a few hours a day, and try to get back to sleeping in your bed to help your swelling.

## 2015-01-06 NOTE — Progress Notes (Signed)
Subjective:    [name] is a 81 y.o. female who presents for Medicare Annual/Subsequent preventive examination.  Preventive Screening-Counseling & Management  Tobacco History  Smoking status  . Never Smoker   Smokeless tobacco  . Never Used     Problems Prior to Visit 1. See Below  Current Problems (verified) Patient Active Problem List   Diagnosis Date Noted  . Thoracic aorta atherosclerosis (HCC) 03/04/2014  . Degenerative arthritis of thoracic spine 03/04/2014  . Osteoporosis, post-menopausal 08/12/2013  . Metabolic syndrome 06/23/2013  . Anemia, iron deficiency 03/16/2013  . Vitamin D deficiency 03/16/2013  . HTN (hypertension) 03/16/2013  . Hypothyroidism   . Urinary tract infection, site not specified   . Diaphragmatic hernia without mention of obstruction or gangrene   . Esophagitis   . Hyperlipidemia   . Prolapse of vaginal walls without mention of uterine prolapse   . Arthritis   . First degree atrioventricular block   . Symptomatic menopausal or female climacteric states   . AF (atrial fibrillation) (HCC)   . Rectal polyp   . Gastric polyp     Medications Prior to Visit Current Outpatient Prescriptions on File Prior to Visit  Medication Sig Dispense Refill  . calcium-vitamin D (OSCAL WITH D) 500-200 MG-UNIT per tablet Take 1 tablet by mouth daily.    . Cholecalciferol (VITAMIN D3) 2000 UNITS TABS Take 1 tablet by mouth daily.      . ferrous fumarate-iron polysaccharide complex (TANDEM) 162-115.2 MG CAPS Take 1 capsule by mouth daily with breakfast.    . furosemide (LASIX) 40 MG tablet Take 1 tablet (40 mg total) by mouth daily. 90 tablet 3  . hydrocortisone (PROCTOCORT) 1 % CREA Apply to area BID PRN 28.4 g 1  . KLOR-CON 10 10 MEQ tablet Take 1 tablet (10 mEq total) by mouth daily. Dispense Klor Con only 90 tablet 3  . levothyroxine (SYNTHROID, LEVOTHROID) 137 MCG tablet Take 1 tablet (137 mcg total) by mouth daily. As directed 90 tablet 3  .  lisinopril (PRINIVIL,ZESTRIL) 20 MG tablet Take 1 tablet (20 mg total) by mouth daily. 90 tablet 1  . LORazepam (ATIVAN) 0.5 MG tablet One half tablet to one daily if needed for anxiety 30 tablet 1  . Multiple Vitamins-Minerals (CENTRUM SILVER) tablet Take 1 tablet by mouth daily.      Current Facility-Administered Medications on File Prior to Visit  Medication Dose Route Frequency Provider Last Rate Last Dose  . cyanocobalamin ((VITAMIN B-12)) injection 1,000 mcg  1,000 mcg Intramuscular Q30 days Ernestina Penna, MD   1,000 mcg at 12/31/14 0901    Current Medications (verified) Current Outpatient Prescriptions  Medication Sig Dispense Refill  . calcium-vitamin D (OSCAL WITH D) 500-200 MG-UNIT per tablet Take 1 tablet by mouth daily.    . Cholecalciferol (VITAMIN D3) 2000 UNITS TABS Take 1 tablet by mouth daily.      . ferrous fumarate-iron polysaccharide complex (TANDEM) 162-115.2 MG CAPS Take 1 capsule by mouth daily with breakfast.    . furosemide (LASIX) 40 MG tablet Take 1 tablet (40 mg total) by mouth daily. 90 tablet 3  . hydrocortisone (PROCTOCORT) 1 % CREA Apply to area BID PRN 28.4 g 1  . KLOR-CON 10 10 MEQ tablet Take 1 tablet (10 mEq total) by mouth daily. Dispense Klor Con only 90 tablet 3  . levothyroxine (SYNTHROID, LEVOTHROID) 137 MCG tablet Take 1 tablet (137 mcg total) by mouth daily. As directed 90 tablet 3  . lisinopril (PRINIVIL,ZESTRIL) 20  MG tablet Take 1 tablet (20 mg total) by mouth daily. 90 tablet 1  . LORazepam (ATIVAN) 0.5 MG tablet One half tablet to one daily if needed for anxiety 30 tablet 1  . Multiple Vitamins-Minerals (CENTRUM SILVER) tablet Take 1 tablet by mouth daily.      Current Facility-Administered Medications  Medication Dose Route Frequency Provider Last Rate Last Dose  . cyanocobalamin ((VITAMIN B-12)) injection 1,000 mcg  1,000 mcg Intramuscular Q3Ernestina Pennald W Moore, MD   1,000 mcg at 12/31/14 0901     Allergies (verified) Aspirin; Codeine;  Sulfa antibiotics; Vicodin; and Warfarin sodium   PAST HISTORY  Family History Family History  Problem Relation Age of Onset  . Hypertension Mother   . Aneurysm Mother     Brain  . Hypertension Father   . Kidney disease Father   . Aneurysm Father     heart  . Arthritis Daughter   . Cancer Daughter     breast  . Hyperlipidemia Sister     Social History Social History  Substance Use Topics  . Smoking status: Never Smoker   . Smokeless tobacco: Never Used  . Alcohol Use: No     Are there smokers in your home (other than you)? No  Risk Factors Current exercise habits: Home exercise routine includes walking about 15 moinutes daily.  Dietary issues discussed: None   Cardiac risk factors: advanced age (older than 9 for men, 20 for women) and hypertension.  Depression Screen (Note: if answer to either of the following is "Yes", a more complete depression screening is indicated)   Over the past two weeks, have you felt down, depressed or hopeless? Yes  Over the past two weeks, have you felt little interest or pleasure in doing things? No  Have you lost interest or pleasure in daily life? No  Do you often feel hopeless? No  Do you cry easily over simple problems? No  Activities of Daily Living In your present state of health, do you have any difficulty performing the following activities?:  Driving? No Managing money?  No Feeding yourself? No Getting from bed to chair? No Climbing a flight of stairs? Yes Preparing food and eating?: No Bathing or showering? No Getting dressed: No Getting to the toilet? No Using the toilet:No Moving around from place to place: No In the past year have you fallen or had a near fall?:No   Are you sexually active?  No  Do you have more than one partner?  No  Hearing Difficulties: Yes Do you often ask people to speak up or repeat themselves? Yes Do you experience ringing or noises in your ears? sometimes Do you have difficulty  understanding soft or whispered voices? Yes   Do you feel that you have a problem with memory? No  Do you often misplace items? No  Do you feel safe at home?  Yes  Cognitive Testing  Alert? Yes  Normal Appearance?Yes  Oriented to person? Yes  Place? Yes   Time? Yes  Recall of three objects?  Yes  Can perform simple calculations? Yes  Displays appropriate judgment?Yes  Can read the correct time from a watch face?Yes   Advanced Directives have been discussed with the patient? Yes  List the Names of Other Physician/Practitioners you currently use: 1.  Rudi Heap- PCP 2. Wallie Char- psychologist 3. Rollene Rotunda - Cardiology   Indicate any recent Medical Services you may have received from other than [facility] providers in the past year (  date may be approximate).  Immunization History  Administered Date(s) Administered  . Influenza Whole 12/31/2009  . Influenza,inj,Quad PF,36+ Mos 12/24/2012, 12/28/2013  . Pneumococcal Conjugate-13 03/16/2013  . Pneumococcal Polysaccharide-23 01/31/2001  . Td 12/01/2004  . Zoster 03/02/2006    Screening Tests Health Maintenance  Topic Date Due  . TETANUS/TDAP  12/02/2014  . INFLUENZA VACCINE  01/23/2015 (Originally 11/01/2014)  . MAMMOGRAM  01/23/2015 (Originally 04/28/2014)  . DEXA SCAN  Completed  . ZOSTAVAX  Completed  . PNA vac Low Risk Adult  Completed    All answers were reviewed with the patient and necessary referrals were made:  Kevin Fenton, MD   01/06/2015   History reviewed: allergies, current medications, past family history, past medical history, past social history, past surgical history and problem list  Review of Systems Pertinent items are noted in HPI.    Objective:       Body mass index is 40.04 kg/(m^2). BP 140/63 mmHg  Pulse 58  Temp(Src) 97.6 F (36.4 C) (Oral)  Ht  (1.626 m)  Wt 233 lb 6.4 oz (105.87 kg)  BMI 40.04 kg/m2   Physical Exam  Gen: NAD, alert, cooperative with exam HEENT:  NCAT, TMs normal bilaterally, nares clear, oropharynx clear CV: 2/6 systolic murmur Resp: CTABL, no wheezes, non-labored Ext: 1+ pitting edema bilateral lower extremities Neuro: Alert and oriented, No gross deficits       Assessment:     [name] is an 81 year old female here for her Medicare annual wellness visit. She is doing well but has an issue with a foul odor in her apartment right now. She states that this is exacerbating her depression and bothering her sleep. The apartment owners have done several things to try to mitigate the odor, however it remains.  She has some hearing loss and needs a follow-up with our audiologist, I have written a referral.  We discussed advanced directives and she received her flu shot today.     Plan:     During the course of the visit the patient was educated and counseled about appropriate screening and preventive services including:    Influenza vaccine  Advanced directives packet given  Diet review for nutrition referral? no  Patient Instructions (the written plan) was given to the patient.  Medicare Attestation I have personally reviewed: The patient's medical and social history Their use of alcohol, tobacco or illicit drugs Their current medications and supplements The patient's functional ability including ADLs,fall risks, home safety risks, cognitive, and hearing and visual impairment Diet and physical activities Evidence for depression or mood disorders  The patient's weight, height, BMI, and visual acuity have been recorded in the chart.  I have made referrals, counseling, and provided education to the patient based on review of the above and I have provided the patient with a written personalized care plan for preventive services.     Kevin Fenton, MD   01/06/2015

## 2015-01-10 ENCOUNTER — Encounter (HOSPITAL_COMMUNITY): Payer: Self-pay | Admitting: Psychology

## 2015-01-10 NOTE — Progress Notes (Signed)
The patient reports that she continued to have a very difficult time with her issues related to depression. She reports that she continues to feel a great deal of shame and guilt around the past and trying to cope with this is continued to be very difficult. Intrusive thoughts and worries continue. However, she reports that she does realize she has improving over where she had been.   Hershal Coria, PsyD 01/10/2015

## 2015-01-21 ENCOUNTER — Encounter: Payer: Self-pay | Admitting: Family Medicine

## 2015-01-21 ENCOUNTER — Ambulatory Visit (INDEPENDENT_AMBULATORY_CARE_PROVIDER_SITE_OTHER): Payer: Medicare Other | Admitting: Family Medicine

## 2015-01-21 VITALS — BP 159/62 | HR 58 | Temp 97.3°F | Ht 64.0 in | Wt 233.0 lb

## 2015-01-21 DIAGNOSIS — I10 Essential (primary) hypertension: Secondary | ICD-10-CM | POA: Diagnosis not present

## 2015-01-21 DIAGNOSIS — E039 Hypothyroidism, unspecified: Secondary | ICD-10-CM | POA: Diagnosis not present

## 2015-01-21 DIAGNOSIS — M25473 Effusion, unspecified ankle: Secondary | ICD-10-CM

## 2015-01-21 DIAGNOSIS — F4322 Adjustment disorder with anxiety: Secondary | ICD-10-CM | POA: Diagnosis not present

## 2015-01-21 DIAGNOSIS — I480 Paroxysmal atrial fibrillation: Secondary | ICD-10-CM

## 2015-01-21 MED ORDER — LISINOPRIL 20 MG PO TABS: 20.0000 mg | ORAL_TABLET | Freq: Every day | ORAL | Status: DC

## 2015-01-21 MED ORDER — FUROSEMIDE 40 MG PO TABS: 40.0000 mg | ORAL_TABLET | Freq: Two times a day (BID) | ORAL | Status: DC

## 2015-01-21 NOTE — Progress Notes (Signed)
Subjective:    Patient ID: [name], female    DOB: 1932/11/07, 81 y.o.   MRN: [id number]  HPI Patient here today for bilateral lower extremity edema and right side rib area pain. The patient complains of some lower extremity edema which has been worse over the past 2 days with some redness. She also complains of some right-sided rib pain and has no history of injury. She is on Lasix 40 mg daily. She also has a history of atrial fibrillation. The patient indicates she's been very stressed recently. That she has some kind of fumes or odors that have been going on in her apartment for at least 2 months and no one seems to be able to find out what the cause and problem is. This is cause increased anxiety. She is having more headaches than usual. The swelling and edema have been bad or worse for the past 2 days. The right-sided rib pain she thinks may be more muscle spasm related. She denies chest pain or shortness of breath other than the back pain on the right side. She has no trouble with her GI tract or trouble passing her water.      Patient Active Problem List   Diagnosis Date Noted  . Hearing loss 01/06/2015  . Thoracic aorta atherosclerosis ([place]) 03/04/2014  . Degenerative arthritis of thoracic spine 03/04/2014  . Osteoporosis, post-menopausal 08/12/2013  . Metabolic syndrome 37/85/8850  . Anemia, iron deficiency 03/16/2013  . Vitamin D deficiency 03/16/2013  . HTN (hypertension) 03/16/2013  . Hypothyroidism   . Urinary tract infection, site not specified   . Diaphragmatic hernia without mention of obstruction or gangrene   . Esophagitis   . Hyperlipidemia   . Prolapse of vaginal walls without mention of uterine prolapse   . Arthritis   . First degree atrioventricular block   . Symptomatic menopausal or female climacteric states   . AF (atrial fibrillation) ([place])   . Rectal polyp   . Gastric polyp    Outpatient Encounter Prescriptions as of 01/21/2015  Medication Sig  .  calcium-vitamin D (OSCAL WITH D) 500-200 MG-UNIT per tablet Take 1 tablet by mouth daily.  . Cholecalciferol (VITAMIN D3) 2000 UNITS TABS Take 1 tablet by mouth daily.    . ferrous fumarate-iron polysaccharide complex (TANDEM) 162-115.2 MG CAPS Take 1 capsule by mouth daily with breakfast.  . furosemide (LASIX) 40 MG tablet Take 1 tablet (40 mg total) by mouth daily.  . hydrocortisone (PROCTOCORT) 1 % CREA Apply to area BID PRN  . KLOR-CON 10 10 MEQ tablet Take 1 tablet (10 mEq total) by mouth daily. Dispense Klor Con only  . levothyroxine (SYNTHROID, LEVOTHROID) 137 MCG tablet Take 1 tablet (137 mcg total) by mouth daily. As directed  . lisinopril (PRINIVIL,ZESTRIL) 20 MG tablet Take 1 tablet (20 mg total) by mouth daily.  Marland Kitchen LORazepam (ATIVAN) 0.5 MG tablet One half tablet to one daily if needed for anxiety  . Multiple Vitamins-Minerals (CENTRUM SILVER) tablet Take 1 tablet by mouth daily.    Facility-Administered Encounter Medications as of 01/21/2015  Medication  . cyanocobalamin ((VITAMIN B-12)) injection 1,000 mcg     Review of Systems  Constitutional: Negative.   HENT: Negative.   Eyes: Negative.   Respiratory: Negative.   Cardiovascular: Positive for leg swelling.  Gastrointestinal: Negative.   Endocrine: Negative.   Genitourinary: Negative.   Musculoskeletal: Positive for arthralgias (right side rib pain).  Skin: Negative.   Allergic/Immunologic: Negative.   Neurological: Negative.  Hematological: Negative.   Psychiatric/Behavioral: Negative.        Objective:   Physical Exam  Constitutional: She is oriented to person, place, and time. She appears well-developed and well-nourished. She appears distressed.  HENT:  Head: Normocephalic and atraumatic.  Right Ear: External ear normal.  Left Ear: External ear normal.  Nose: Nose normal.  Mouth/Throat: Oropharynx is clear and moist.  Eyes: Conjunctivae and EOM are normal. Pupils are equal, round, and reactive to light.  Right eye exhibits no discharge. Left eye exhibits no discharge. No scleral icterus.  Neck: Normal range of motion. Neck supple. No thyromegaly present.  Cardiovascular: Normal rate, regular rhythm and normal heart sounds.   No murmur heard. Distal pulses were difficult call to palpate secondary to the edema in her feet and the varicosities in her feet. The heart is regular today at 72/m  Pulmonary/Chest: Effort normal and breath sounds normal. No respiratory distress. She has no wheezes. She has no rales. She exhibits no tenderness.  Clear anteriorly and posteriorly  Abdominal: Soft. Bowel sounds are normal. She exhibits no mass. There is tenderness. There is no rebound and no guarding.  Slight generalized tenderness. Obesity. No masses or organ enlargement.  Musculoskeletal: Normal range of motion. She exhibits no edema.  Lymphadenopathy:    She has no cervical adenopathy.  Neurological: She is alert and oriented to person, place, and time.  Skin: Skin is warm and dry. No rash noted.  Psychiatric: She has a normal mood and affect. Her behavior is normal. Judgment and thought content normal.  Stressed.  Nursing note and vitals reviewed.  BP 159/62 mmHg  Pulse 58  Temp(Src) 97.3 F (36.3 C) (Oral)  Ht '5\' 4"'  (1.626 m)  Wt 233 lb (105.688 kg)  BMI 39.97 kg/m2 Repeat blood pressure 124/78 right arm large cuff sitting Pulse ox 97% The patient has already had her flu shot.      Assessment & Plan:  1. Ankle swelling, unspecified laterality -Take extra half of Lasix and wear support hose as discussed - BMP8+EGFR - CBC with Differential/Platelet - Brain natriuretic peptide - Thyroid Panel With TSH  2. Adjustment disorder with anxious mood -Continue to work through this by discussing the issues with the landlord and trying to move to a different location  3. Paroxysmal atrial fibrillation (HCC) -The heart rhythm is regular today at 72/m. Continue with current treatment  4.  Hypothyroidism, unspecified hypothyroidism type -Thyroid profile  5. Essential hypertension -BMP  Patient Instructions  Take lasix 40 mg -- one in the morning and 1/2 in the evenings at 4 PM Follow up in 1 week  We will call you about your lab work. Wear support hose and put these on the first thing in the morning Watch sodium intake more closely Elevate feet during the day   Arrie Senate MD

## 2015-01-21 NOTE — Patient Instructions (Addendum)
Take lasix 40 mg -- one in the morning and 1/2 in the evenings at 4 PM Follow up in 1 week  We will call you about your lab work. Wear support hose and put these on the first thing in the morning Watch sodium intake more closely Elevate feet during the day

## 2015-01-22 LAB — CBC WITH DIFFERENTIAL/PLATELET
BASOS ABS: 0 10*3/uL (ref 0.0–0.2)
Basos: 1 %
EOS (ABSOLUTE): 0.3 10*3/uL (ref 0.0–0.4)
EOS: 6 %
HEMATOCRIT: 34.2 % (ref 34.0–46.6)
Hemoglobin: 11.2 g/dL (ref 11.1–15.9)
IMMATURE GRANULOCYTES: 0 %
Immature Grans (Abs): 0 10*3/uL (ref 0.0–0.1)
Lymphocytes Absolute: 1.2 10*3/uL (ref 0.7–3.1)
Lymphs: 20 %
MCH: 30.6 pg (ref 26.6–33.0)
MCHC: 32.7 g/dL (ref 31.5–35.7)
MCV: 93 fL (ref 79–97)
MONOCYTES: 11 %
MONOS ABS: 0.6 10*3/uL (ref 0.1–0.9)
NEUTROS PCT: 62 %
Neutrophils Absolute: 3.7 10*3/uL (ref 1.4–7.0)
PLATELETS: 204 10*3/uL (ref 150–379)
RBC: 3.66 x10E6/uL — AB (ref 3.77–5.28)
RDW: 14.5 % (ref 12.3–15.4)
WBC: 5.9 10*3/uL (ref 3.4–10.8)

## 2015-01-22 LAB — THYROID PANEL WITH TSH
Free Thyroxine Index: 2.6 (ref 1.2–4.9)
T3 Uptake Ratio: 30 % (ref 24–39)
T4, Total: 8.7 ug/dL (ref 4.5–12.0)
TSH: 2.8 u[IU]/mL (ref 0.450–4.500)

## 2015-01-22 LAB — BRAIN NATRIURETIC PEPTIDE: BNP: 205 pg/mL — AB (ref 0.0–100.0)

## 2015-01-22 LAB — BMP8+EGFR
BUN/Creatinine Ratio: 20 (ref 11–26)
BUN: 24 mg/dL (ref 8–27)
CHLORIDE: 96 mmol/L — AB (ref 97–106)
CO2: 28 mmol/L (ref 18–29)
CREATININE: 1.2 mg/dL — AB (ref 0.57–1.00)
Calcium: 9.5 mg/dL (ref 8.7–10.3)
GFR calc Af Amer: 49 mL/min/{1.73_m2} — ABNORMAL LOW (ref >59)
GFR calc non Af Amer: 43 mL/min/{1.73_m2} — ABNORMAL LOW (ref >59)
GLUCOSE: 116 mg/dL — AB (ref 65–99)
Potassium: 4.2 mmol/L (ref 3.5–5.2)
SODIUM: 141 mmol/L (ref 136–144)

## 2015-01-24 ENCOUNTER — Other Ambulatory Visit: Payer: Medicare Other

## 2015-01-26 ENCOUNTER — Ambulatory Visit (INDEPENDENT_AMBULATORY_CARE_PROVIDER_SITE_OTHER): Payer: Medicare Other

## 2015-01-26 ENCOUNTER — Ambulatory Visit (INDEPENDENT_AMBULATORY_CARE_PROVIDER_SITE_OTHER): Payer: Medicare Other | Admitting: Family Medicine

## 2015-01-26 ENCOUNTER — Encounter: Payer: Self-pay | Admitting: Family Medicine

## 2015-01-26 VITALS — BP 149/75 | HR 73 | Temp 96.9°F | Ht 64.0 in | Wt 233.2 lb

## 2015-01-26 DIAGNOSIS — R600 Localized edema: Secondary | ICD-10-CM

## 2015-01-26 DIAGNOSIS — I480 Paroxysmal atrial fibrillation: Secondary | ICD-10-CM

## 2015-01-26 DIAGNOSIS — I700 Atherosclerosis of aorta: Secondary | ICD-10-CM

## 2015-01-26 DIAGNOSIS — R609 Edema, unspecified: Secondary | ICD-10-CM | POA: Diagnosis not present

## 2015-01-26 DIAGNOSIS — R0602 Shortness of breath: Secondary | ICD-10-CM

## 2015-01-26 NOTE — Patient Instructions (Addendum)
We will call you with the repeat lab work results and the results of the chest x-ray report from the radiologist as soon as that becomes available Because of your issues with increasing shortness of breath and edema we will also arrange for you to visit with the cardiologist next door as soon as possible Continue current treatment with Lasix and continue to avoid sodium and avoid NSAIDs as you are already doing. Today and Tomorrow take a whole lasix.

## 2015-01-26 NOTE — Progress Notes (Signed)
Subjective:    Patient ID: [name], female    DOB: Apr 09, 1932, 81 y.o.   MRN: [id number]  HPI Patient is here today for a 1 week recheck on edema. She is taking furosemide 49m 1 in the am and 1/2  At 4pm daily. Patient states that the edema has not improved over the last week. Previous lab work was reviewed with the patient from the last visit.The BMP has a blood sugar that is elevated at 116. The creatinine, the most important kidney function test is also slightly more elevated than in the past. This is 1.20. Please make sure that this patient is not taking any NSAIDs like ibuprofen and Aleve. She should only take Tylenol if needed for pain. The electrolytes including potassium are good except the chloride is slightly decreased and this is consistent with past readings. We will continue to monitor the creatinine that is more elevated today at future visits. The CBC has a normal white blood cell count. The hemoglobin is good at 11.2 and that is consistent with past readings. The platelet count is adequate. The BNP is slightly elevated. This is 205. All thyroid function tests are within normal limits We will follow-up with the patient as planned and make sure that we get the edema under better control The patient indicates today that she's noticed a little bit more fullness in the right chest at times and especially some shortness of breath at times. She denies any trouble swallowing or heartburn or indigestion or blood in the stool. She is passing her water without problems she says she does some  pressure right after she takes the fluid pill but then is relieved when she voids.   Review of Systems  Constitutional: Positive for fatigue.  HENT: Negative.   Eyes: Negative.   Respiratory: Negative.   Cardiovascular: Positive for leg swelling.       And ankles   Gastrointestinal: Negative.   Endocrine: Negative.   Genitourinary: Negative.   Musculoskeletal: Negative.   Skin: Negative.     Allergic/Immunologic: Negative.   Neurological: Negative.   Hematological: Negative.   Psychiatric/Behavioral: Negative.              Patient Active Problem List   Diagnosis Date Noted  . Hearing loss 01/06/2015  . Thoracic aorta atherosclerosis ([facility] [place] 03/04/2014  . Degenerative arthritis of thoracic spine 03/04/2014  . Osteoporosis, post-menopausal 08/12/2013  . Metabolic syndrome 092/02/9416 . Anemia, iron deficiency 03/16/2013  . Vitamin D deficiency 03/16/2013  . HTN (hypertension) 03/16/2013  . Hypothyroidism   . Urinary tract infection, site not specified   . Diaphragmatic hernia without mention of obstruction or gangrene   . Esophagitis   . Hyperlipidemia   . Prolapse of vaginal walls without mention of uterine prolapse   . Arthritis   . First degree atrioventricular block   . Symptomatic menopausal or female climacteric states   . AF (atrial fibrillation) ([place]   . Rectal polyp   . Gastric polyp    Outpatient Encounter Prescriptions as of 01/26/2015  Medication Sig  . calcium-vitamin D (OSCAL WITH D) 500-200 MG-UNIT per tablet Take 1 tablet by mouth daily.  . Cholecalciferol (VITAMIN D3) 2000 UNITS TABS Take 1 tablet by mouth daily.    . ferrous fumarate-iron polysaccharide complex (TANDEM) 162-115.2 MG CAPS Take 1 capsule by mouth daily with breakfast.  . furosemide (LASIX) 40 MG tablet Take 1 tablet (40 mg total) by mouth 2 (two) times daily. As directed  .  hydrocortisone (PROCTOCORT) 1 % CREA Apply to area BID PRN  . KLOR-CON 10 10 MEQ tablet Take 1 tablet (10 mEq total) by mouth daily. Dispense Klor Con only  . levothyroxine (SYNTHROID, LEVOTHROID) 137 MCG tablet Take 1 tablet (137 mcg total) by mouth daily. As directed  . lisinopril (PRINIVIL,ZESTRIL) 20 MG tablet Take 1 tablet (20 mg total) by mouth daily.  Marland Kitchen LORazepam (ATIVAN) 0.5 MG tablet One half tablet to one daily if needed for anxiety  . Multiple Vitamins-Minerals (CENTRUM SILVER) tablet Take 1  tablet by mouth daily.    Facility-Administered Encounter Medications as of 01/26/2015  Medication  . cyanocobalamin ((VITAMIN B-12)) injection 1,000 mcg       Objective:   Physical Exam  Constitutional: She is oriented to person, place, and time. She appears well-developed and well-nourished. No distress.  HENT:  Head: Normocephalic and atraumatic.  Right Ear: External ear normal.  Left Ear: External ear normal.  Nose: Nose normal.  Mouth/Throat: Oropharynx is clear and moist.  Eyes: Conjunctivae and EOM are normal. Pupils are equal, round, and reactive to light. Right eye exhibits no discharge. Left eye exhibits no discharge. No scleral icterus.  Neck: Normal range of motion. Neck supple. No thyromegaly present.  Cardiovascular: Normal rate and normal heart sounds.   No murmur heard. The heart was slightly irregular at 72/m  Pulmonary/Chest: Effort normal and breath sounds normal. No respiratory distress. She has no wheezes. She has no rales. She exhibits no tenderness.  Abdominal: Soft. Bowel sounds are normal. She exhibits no mass. There is no tenderness. There is no rebound and no guarding.  Minimal suprapubic tenderness otherwise no masses or organ enlargement or bruits  Musculoskeletal: Normal range of motion. She exhibits edema. She exhibits no tenderness.  She still has 3+ pitting edema bilaterally.  Lymphadenopathy:    She has no cervical adenopathy.  Neurological: She is alert and oriented to person, place, and time.  Skin: Skin is warm and dry. No rash noted.  Psychiatric: She has a normal mood and affect. Her behavior is normal. Judgment and thought content normal.  Nursing note and vitals reviewed.  BP 149/75 mmHg  Pulse 73  Temp(Src) 96.9 F (36.1 C) (Oral)  Ht '5\' 4"'  (1.626 m)  Wt 233 lb 3.2 oz (105.779 kg)  BMI 40.01 kg/m2  [facility] reading (PRIMARY) by  Dr. Brunilda Payor x-ray-heart is enlarged and the aorta  has atherosclerosis                                         Assessment & Plan:  1. SOB (shortness of breath) -Chest x-ray today and appointment with cardiologist because of history of atrial fibrillation - BMP8+EGFR - Brain natriuretic peptide - DG Chest 2 View; Future - Ambulatory referral to Cardiology  2. Edema extremities -Continue current fluid pill dosing until she sees the cardiologist - Ambulatory referral to Cardiology  3. Paroxysmal atrial fibrillation (HCC) -There was some irregularity today but the rate was good around 72/m  4. Thoracic aorta atherosclerosis ([place]) -Continue with aggressive therapeutic lifestyle changes and dietary habits  Patient Instructions  We will call you with the repeat lab work results and the results of the chest x-ray report from the radiologist as soon as that becomes available Because of your issues with increasing shortness of breath and edema we will also arrange for you to visit with the cardiologist next  door as soon as possible Continue current treatment with Lasix and continue to avoid sodium and avoid NSAIDs as you are already doing.   Arrie Senate MD

## 2015-01-27 ENCOUNTER — Telehealth: Payer: Self-pay | Admitting: *Deleted

## 2015-01-27 ENCOUNTER — Ambulatory Visit (INDEPENDENT_AMBULATORY_CARE_PROVIDER_SITE_OTHER): Payer: No Typology Code available for payment source | Admitting: Psychology

## 2015-01-27 ENCOUNTER — Encounter (HOSPITAL_COMMUNITY): Payer: Self-pay | Admitting: Psychology

## 2015-01-27 ENCOUNTER — Ambulatory Visit (INDEPENDENT_AMBULATORY_CARE_PROVIDER_SITE_OTHER): Payer: Medicare Other | Admitting: *Deleted

## 2015-01-27 DIAGNOSIS — F331 Major depressive disorder, recurrent, moderate: Secondary | ICD-10-CM | POA: Diagnosis not present

## 2015-01-27 DIAGNOSIS — M545 Low back pain: Secondary | ICD-10-CM | POA: Diagnosis not present

## 2015-01-27 LAB — BMP8+EGFR
BUN/Creatinine Ratio: 17 (ref 11–26)
BUN: 18 mg/dL (ref 8–27)
CALCIUM: 9.6 mg/dL (ref 8.7–10.3)
CHLORIDE: 95 mmol/L — AB (ref 97–106)
CO2: 29 mmol/L (ref 18–29)
Creatinine, Ser: 1.04 mg/dL — ABNORMAL HIGH (ref 0.57–1.00)
GFR calc non Af Amer: 51 mL/min/{1.73_m2} — ABNORMAL LOW (ref >59)
GFR, EST AFRICAN AMERICAN: 59 mL/min/{1.73_m2} — AB (ref >59)
Glucose: 133 mg/dL — ABNORMAL HIGH (ref 65–99)
POTASSIUM: 3.8 mmol/L (ref 3.5–5.2)
Sodium: 142 mmol/L (ref 136–144)

## 2015-01-27 LAB — POCT URINALYSIS DIPSTICK
Glucose, UA: NEGATIVE
Ketones, UA: NEGATIVE
NITRITE UA: NEGATIVE
PH UA: 8
Spec Grav, UA: 1.01
UROBILINOGEN UA: NEGATIVE

## 2015-01-27 LAB — POCT UA - MICROSCOPIC ONLY
Bacteria, U Microscopic: NEGATIVE
CASTS, UR, LPF, POC: NEGATIVE
Crystals, Ur, HPF, POC: NEGATIVE
YEAST UA: NEGATIVE

## 2015-01-27 LAB — BRAIN NATRIURETIC PEPTIDE: BNP: 199.1 pg/mL — AB (ref 0.0–100.0)

## 2015-01-27 MED ORDER — NITROFURANTOIN MACROCRYSTAL 100 MG PO CAPS: 100.0000 mg | ORAL_CAPSULE | Freq: Four times a day (QID) | ORAL | Status: DC

## 2015-01-27 NOTE — Progress Notes (Signed)
Patient came in to discuss lab result and increase in lasix dose. She took her 2nd dose yesterday evening and felt some burning in her abd, back, and thighs about 30 minutes later. It resolved on its on later in the evening and has not recurred.  She has not taken her lasix this morning. Advised her to take her lasix and to call me if that burning returns.  She also mentioned that her back hurts at times and she wonders if it is her kidneys. Explained that kidney infection and kidney failure are different and that kidney failure doesn't have any pain. Urine obtained anyway just to make sure.  Patient aware that Dr Christell ConstantMoore is out of the office today and will return tomorrow.  I will call her with results of the urine later today.

## 2015-01-27 NOTE — Addendum Note (Signed)
Addended by: Gwenith DailyHUDY, Wilmon Conover N on: 01/27/2015 05:53 PM   Modules accepted: Orders

## 2015-01-27 NOTE — Progress Notes (Signed)
The patient reports that she has continued to have issues with smells in her appartment but has not had any success and this is really stressing her.  She has hypersomnia and does not know why.  She has increasing anxiety and worry.  Worked on Producer, television/film/videobuilding coping skills, but her hypersomnia is part of major stressors.   Hershal CoriaODENBOUGH,JOHN R, PsyD 01/27/2015

## 2015-01-28 ENCOUNTER — Telehealth: Payer: Self-pay | Admitting: Family Medicine

## 2015-01-28 NOTE — Telephone Encounter (Signed)
Episodes of burning in her thighs, abd, and back after taking Lasix yesterday and the day before. It improved around midnight last night and didn't worsen after taking Lasix this morning.  She continues to have lower extremity edema. Creatine improved from a week ago and BNP remains relatively unchanged from a week ago.   In regards to the urine specimen she does complain of some dysuria today and is going to pickup her antibiotic. She will let us know if those symptoms do not resolve or worsen.   What do you suggest about the continued edema and adverse reaction to Lasix?

## 2015-01-28 NOTE — Telephone Encounter (Signed)
Aware of lab results  

## 2015-01-28 NOTE — Telephone Encounter (Signed)
Continue Lasix as doing before we increased the dose 2 days ago.

## 2015-01-29 LAB — URINE CULTURE

## 2015-01-31 ENCOUNTER — Other Ambulatory Visit: Payer: Self-pay | Admitting: *Deleted

## 2015-01-31 MED ORDER — AMPICILLIN 500 MG PO CAPS: 500.0000 mg | ORAL_CAPSULE | Freq: Four times a day (QID) | ORAL | Status: DC

## 2015-02-01 ENCOUNTER — Ambulatory Visit (INDEPENDENT_AMBULATORY_CARE_PROVIDER_SITE_OTHER): Payer: Medicare Other | Admitting: *Deleted

## 2015-02-01 VITALS — BP 136/69

## 2015-02-01 DIAGNOSIS — E538 Deficiency of other specified B group vitamins: Secondary | ICD-10-CM | POA: Diagnosis not present

## 2015-02-01 NOTE — Progress Notes (Signed)
Patient aware. Will pick up new rx and have urine rechecked when finished.

## 2015-02-01 NOTE — Telephone Encounter (Signed)
Patient has noticed a decrease in edema overnight and her breathing has improved. She continues to have some burning in her thighs and abd and back on the Lasix to 1 tablet daily and see if that helps. She will let us know if the burning doesn't resolve or if swelling increases. Follow up appt scheduled for 02/04/15 with Dr Christell ConstantMoore.

## 2015-02-01 NOTE — Patient Instructions (Signed)

## 2015-02-01 NOTE — Progress Notes (Signed)
Pt given B12 injection IM left deltoid and tolerated well. °

## 2015-02-04 ENCOUNTER — Encounter: Payer: Self-pay | Admitting: Family Medicine

## 2015-02-04 ENCOUNTER — Ambulatory Visit (INDEPENDENT_AMBULATORY_CARE_PROVIDER_SITE_OTHER): Payer: Medicare Other | Admitting: Family Medicine

## 2015-02-04 VITALS — BP 135/72 | HR 64 | Temp 97.0°F | Ht 64.0 in | Wt 232.0 lb

## 2015-02-04 DIAGNOSIS — M545 Low back pain: Secondary | ICD-10-CM | POA: Diagnosis not present

## 2015-02-04 DIAGNOSIS — R600 Localized edema: Secondary | ICD-10-CM

## 2015-02-04 DIAGNOSIS — R609 Edema, unspecified: Secondary | ICD-10-CM | POA: Diagnosis not present

## 2015-02-04 NOTE — Addendum Note (Signed)
Addended by: Magdalene RiverBULLINS, Jaire Pinkham H on: 02/04/2015 12:45 PM   Modules accepted: Orders

## 2015-02-04 NOTE — Patient Instructions (Signed)
Continue to watch sodium intake Continue and complete antibiotic for urinary tract infection Elevate the feet and legs when possible during the day Continue with 40 mg of Lasix daily We will plan to recheck you one more time in 2-3 weeks and repeat a BMP at that time.

## 2015-02-04 NOTE — Progress Notes (Signed)
Subjective:    Patient ID: [name], female    DOB: Jun 11, 1932, 81 y.o.   MRN: [id number]  HPI Patient here today for 1 week follow up on legs burning and edema. She states she may be some better, but is still having the same problems. The patient continues to have issues with fumes and odors and her current apartment. She says that it has been checked thoroughly and no one can find a reason for this. But she is insistent that there some odors and fumes there and some of her back burning and pain have been more worrisome since those fumes and odors have been present. She is considering moving to a different apartment if not in the same complex at a different location. She does see the psychologist regularly. We will repeat a urinalysis today and get a BMP and see her back again in about 3 weeks. She denies any chest pain or additional shortness of breath.     Patient Active Problem List   Diagnosis Date Noted  . Hearing loss 01/06/2015  . Thoracic aorta atherosclerosis (HCC) 03/04/2014  . Degenerative arthritis of thoracic spine 03/04/2014  . Osteoporosis, post-menopausal 08/12/2013  . Metabolic syndrome 06/23/2013  . Anemia, iron deficiency 03/16/2013  . Vitamin D deficiency 03/16/2013  . HTN (hypertension) 03/16/2013  . Hypothyroidism   . Urinary tract infection, site not specified   . Diaphragmatic hernia without mention of obstruction or gangrene   . Esophagitis   . Hyperlipidemia   . Prolapse of vaginal walls without mention of uterine prolapse   . Arthritis   . First degree atrioventricular block   . Symptomatic menopausal or female climacteric states   . AF (atrial fibrillation) (HCC)   . Rectal polyp   . Gastric polyp    Outpatient Encounter Prescriptions as of 02/04/2015  Medication Sig  . ampicillin (PRINCIPEN) 500 MG capsule Take 1 capsule (500 mg total) by mouth 4 (four) times daily.  . calcium-vitamin D (OSCAL WITH D) 500-200 MG-UNIT per tablet Take 1 tablet by  mouth daily.  . Cholecalciferol (VITAMIN D3) 2000 UNITS TABS Take 1 tablet by mouth daily.    . ferrous fumarate-iron polysaccharide complex (TANDEM) 162-115.2 MG CAPS Take 1 capsule by mouth daily with breakfast.  . furosemide (LASIX) 40 MG tablet Take 1 tablet (40 mg total) by mouth 2 (two) times daily. As directed (Patient taking differently: Take 40 mg by mouth daily. As directed)  . hydrocortisone (PROCTOCORT) 1 % CREA Apply to area BID PRN  . KLOR-CON 10 10 MEQ tablet Take 1 tablet (10 mEq total) by mouth daily. Dispense Klor Con only  . levothyroxine (SYNTHROID, LEVOTHROID) 137 MCG tablet Take 1 tablet (137 mcg total) by mouth daily. As directed  . lisinopril (PRINIVIL,ZESTRIL) 20 MG tablet Take 1 tablet (20 mg total) by mouth daily.  Marland Kitchen. LORazepam (ATIVAN) 0.5 MG tablet One half tablet to one daily if needed for anxiety  . Multiple Vitamins-Minerals (CENTRUM SILVER) tablet Take 1 tablet by mouth daily.   . [DISCONTINUED] nitrofurantoin (MACRODANTIN) 100 MG capsule Take 1 capsule (100 mg total) by mouth 4 (four) times daily.   Facility-Administered Encounter Medications as of 02/04/2015  Medication  . cyanocobalamin ((VITAMIN B-12)) injection 1,000 mcg      Review of Systems  Constitutional: Negative.   HENT: Negative.   Eyes: Negative.   Respiratory: Negative.   Cardiovascular: Negative.   Gastrointestinal: Negative.   Endocrine: Negative.   Genitourinary: Negative.   Musculoskeletal: Negative.  Skin: Negative.        Edema and legs burning  Allergic/Immunologic: Negative.   Neurological: Negative.   Hematological: Negative.   Psychiatric/Behavioral: Negative.        Objective:   Physical Exam  Constitutional: She appears well-developed and well-nourished. She appears distressed.  HENT:  Head: Normocephalic.  Eyes: Conjunctivae and EOM are normal. Pupils are equal, round, and reactive to light. Right eye exhibits no discharge. Left eye exhibits no discharge.  Neck:  Normal range of motion. Neck supple.  Cardiovascular: Normal rate, regular rhythm and normal heart sounds.   No murmur heard. Pulmonary/Chest: Effort normal and breath sounds normal. She has no wheezes. She has no rales.  Abdominal: Soft. Bowel sounds are normal. There is no tenderness. There is no rebound and no guarding.  Musculoskeletal: Normal range of motion. She exhibits edema.  Neurological: She is alert.  Skin: Skin is warm and dry. No rash noted.  Psychiatric: She has a normal mood and affect. Her behavior is normal. Thought content normal.  Nursing note and vitals reviewed.   BP 135/72 mmHg  Pulse 64  Temp(Src) 97 F (36.1 C) (Oral)  Ht  (1.626 m)  Wt 232 lb (105.235 kg)  BMI 39.80 kg/m2       Assessment & Plan:  1. Low back pain without sciatica, unspecified back pain laterality -Continue and complete the antibiotic for the urinary tract infection  2. Edema extremities -This is some better though she still has some edema and we will continue with the Lasix 40 mg daily -We will see her back in 2-3 weeks and do another BMP at that time.  Patient Instructions  Continue to watch sodium intake Continue and complete antibiotic for urinary tract infection Elevate the feet and legs when possible during the day Continue with 40 mg of Lasix daily We will plan to recheck you one more time in 2-3 weeks and repeat a BMP at that time.   Nyra Capes MD

## 2015-02-14 ENCOUNTER — Other Ambulatory Visit (INDEPENDENT_AMBULATORY_CARE_PROVIDER_SITE_OTHER): Payer: Medicare Other

## 2015-02-14 DIAGNOSIS — R600 Localized edema: Secondary | ICD-10-CM

## 2015-02-14 DIAGNOSIS — R609 Edema, unspecified: Secondary | ICD-10-CM | POA: Diagnosis not present

## 2015-02-14 DIAGNOSIS — M545 Low back pain: Secondary | ICD-10-CM | POA: Diagnosis not present

## 2015-02-14 LAB — POCT URINALYSIS DIPSTICK
BILIRUBIN UA: NEGATIVE
Glucose, UA: NEGATIVE
KETONES UA: NEGATIVE
Nitrite, UA: NEGATIVE
PH UA: 6
Protein, UA: NEGATIVE
SPEC GRAV UA: 1.01
Urobilinogen, UA: NEGATIVE

## 2015-02-14 LAB — POCT UA - MICROSCOPIC ONLY
Bacteria, U Microscopic: NEGATIVE
CRYSTALS, UR, HPF, POC: NEGATIVE
Casts, Ur, LPF, POC: NEGATIVE
MUCUS UA: NEGATIVE
YEAST UA: NEGATIVE

## 2015-02-14 NOTE — Progress Notes (Signed)
Lab only 

## 2015-02-16 ENCOUNTER — Telehealth: Payer: Self-pay | Admitting: Family Medicine

## 2015-02-16 NOTE — Telephone Encounter (Signed)
Patient to try mucinex, robitussin cough syrup and drink lots of fluids.  If no improvement , she will schedule an appointment.

## 2015-02-17 LAB — URINE CULTURE

## 2015-02-18 ENCOUNTER — Telehealth: Payer: Self-pay | Admitting: Family Medicine

## 2015-02-18 MED ORDER — LEVOTHYROXINE SODIUM 137 MCG PO TABS: 137.0000 ug | ORAL_TABLET | Freq: Every day | ORAL | Status: DC

## 2015-02-18 MED ORDER — CIPROFLOXACIN HCL 500 MG PO TABS: 500.0000 mg | ORAL_TABLET | Freq: Two times a day (BID) | ORAL | Status: DC

## 2015-02-18 NOTE — Telephone Encounter (Signed)
Detailed message left that rx has been sent to pharmacy.  

## 2015-02-18 NOTE — Progress Notes (Signed)
Patient aware of lab results and need for repeat urine. Please call in her rx

## 2015-02-21 ENCOUNTER — Ambulatory Visit: Payer: Medicare Other | Admitting: Family Medicine

## 2015-02-25 ENCOUNTER — Encounter: Payer: Self-pay | Admitting: Family Medicine

## 2015-02-25 ENCOUNTER — Ambulatory Visit (INDEPENDENT_AMBULATORY_CARE_PROVIDER_SITE_OTHER): Payer: Medicare Other | Admitting: Family Medicine

## 2015-02-25 VITALS — BP 139/68 | HR 57 | Temp 97.2°F | Ht 64.0 in | Wt 230.0 lb

## 2015-02-25 DIAGNOSIS — E034 Atrophy of thyroid (acquired): Secondary | ICD-10-CM

## 2015-02-25 DIAGNOSIS — R0989 Other specified symptoms and signs involving the circulatory and respiratory systems: Secondary | ICD-10-CM | POA: Diagnosis not present

## 2015-02-25 DIAGNOSIS — I480 Paroxysmal atrial fibrillation: Secondary | ICD-10-CM | POA: Diagnosis not present

## 2015-02-25 DIAGNOSIS — M47814 Spondylosis without myelopathy or radiculopathy, thoracic region: Secondary | ICD-10-CM | POA: Diagnosis not present

## 2015-02-25 DIAGNOSIS — R300 Dysuria: Secondary | ICD-10-CM

## 2015-02-25 DIAGNOSIS — E038 Other specified hypothyroidism: Secondary | ICD-10-CM | POA: Diagnosis not present

## 2015-02-25 DIAGNOSIS — I10 Essential (primary) hypertension: Secondary | ICD-10-CM | POA: Diagnosis not present

## 2015-02-25 LAB — POCT URINALYSIS DIPSTICK
BILIRUBIN UA: NEGATIVE
Glucose, UA: NEGATIVE
KETONES UA: NEGATIVE
NITRITE UA: NEGATIVE
PH UA: 5
Protein, UA: NEGATIVE
SPEC GRAV UA: 1.025
Urobilinogen, UA: NEGATIVE

## 2015-02-25 LAB — POCT UA - MICROSCOPIC ONLY
CASTS, UR, LPF, POC: NEGATIVE
CRYSTALS, UR, HPF, POC: NEGATIVE
Mucus, UA: NEGATIVE
YEAST UA: NEGATIVE

## 2015-02-25 NOTE — Patient Instructions (Signed)
The patient should drink plenty of fluids and stay well hydrated She should try taking some Delsym if needed for cough We will call with the urine culture results as soon as that becomes available She should use nasal saline for head congestion

## 2015-02-25 NOTE — Progress Notes (Signed)
Subjective:    Patient ID: [name], female    DOB: 1932/11/03, 81 y.o.   MRN: [id number]  HPI Patient here today for 3 week follow up on urine, back pain, and edema. She is doing some better. She has completed the ciprofloxacin prescribed for a urinary tract infection. She still is having some low back pain and edema and some burning. She gave [place] some urine today but it was not a lot so we will only do a culture and sensitivity on this. As of note her weight appears to be down a couple more pounds and this is good. She still taking her Lasix. She is taking 40 mg daily. She has seen the audiologist recently he questioned whether she needed to go see someone about her nerves. She tells me today that the people that run in the apartments that she is living at our talking about moving her to a different apartment the first of the month and I told her I thought this was a good thing. If she continues to have problems with burning sensations around her body and feeling bad we will may set up an appointment with a neurologist and she is aware that. We reviewed her lab work and she is up-to-date with her thyroid. She is also had a CBC that was within normal limits. We will get a B12 level on her today and a BMP because she's been taking the Lasix more regularly. She will also have a urine culture done since she has completed the ciprofloxacin. She does have some continued chest congestion and she says that the Mucinex does not work well for her.       Patient Active Problem List   Diagnosis Date Noted  . Hearing loss 01/06/2015  . Thoracic aorta atherosclerosis ([place]) 03/04/2014  . Degenerative arthritis of thoracic spine 03/04/2014  . Osteoporosis, post-menopausal 08/12/2013  . Metabolic syndrome 40/81/4481  . Anemia, iron deficiency 03/16/2013  . Vitamin D deficiency 03/16/2013  . HTN (hypertension) 03/16/2013  . Hypothyroidism   . Urinary tract infection, site not specified   . Diaphragmatic  hernia without mention of obstruction or gangrene   . Esophagitis   . Hyperlipidemia   . Prolapse of vaginal walls without mention of uterine prolapse   . Arthritis   . First degree atrioventricular block   . Symptomatic menopausal or female climacteric states   . AF (atrial fibrillation) ([place])   . Rectal polyp   . Gastric polyp    Outpatient Encounter Prescriptions as of 02/25/2015  Medication Sig  . calcium-vitamin D (OSCAL WITH D) 500-200 MG-UNIT per tablet Take 1 tablet by mouth daily.  . Cholecalciferol (VITAMIN D3) 2000 UNITS TABS Take 1 tablet by mouth daily.    . ferrous fumarate-iron polysaccharide complex (TANDEM) 162-115.2 MG CAPS Take 1 capsule by mouth daily with breakfast.  . furosemide (LASIX) 40 MG tablet Take 1 tablet (40 mg total) by mouth 2 (two) times daily. As directed (Patient taking differently: Take 40 mg by mouth daily. As directed)  . hydrocortisone (PROCTOCORT) 1 % CREA Apply to area BID PRN  . KLOR-CON 10 10 MEQ tablet Take 1 tablet (10 mEq total) by mouth daily. Dispense Klor Con only  . levothyroxine (SYNTHROID, LEVOTHROID) 137 MCG tablet Take 1 tablet (137 mcg total) by mouth daily. As directed  . lisinopril (PRINIVIL,ZESTRIL) 20 MG tablet Take 1 tablet (20 mg total) by mouth daily.  Marland Kitchen LORazepam (ATIVAN) 0.5 MG tablet One half tablet to one  daily if needed for anxiety  . Multiple Vitamins-Minerals (CENTRUM SILVER) tablet Take 1 tablet by mouth daily.   . [DISCONTINUED] ampicillin (PRINCIPEN) 500 MG capsule Take 1 capsule (500 mg total) by mouth 4 (four) times daily.  . [DISCONTINUED] ciprofloxacin (CIPRO) 500 MG tablet Take 1 tablet (500 mg total) by mouth 2 (two) times daily.   Facility-Administered Encounter Medications as of 02/25/2015  Medication  . cyanocobalamin ((VITAMIN B-12)) injection 1,000 mcg     Review of Systems  Constitutional: Negative.   HENT: Negative.   Eyes: Negative.   Respiratory: Negative.   Cardiovascular: Negative.     Gastrointestinal: Negative.   Endocrine: Negative.   Genitourinary: Positive for dysuria.  Musculoskeletal: Negative.   Skin: Negative.   Allergic/Immunologic: Negative.   Neurological: Negative.   Hematological: Negative.   Psychiatric/Behavioral: Negative.        Objective:   Physical Exam  Constitutional: She is oriented to person, place, and time. She appears well-developed and well-nourished. No distress.  HENT:  Head: Normocephalic and atraumatic.  Right Ear: External ear normal.  Left Ear: External ear normal.  Nose: Nose normal.  Mouth/Throat: Oropharynx is clear and moist.  Eyes: Conjunctivae and EOM are normal. Pupils are equal, round, and reactive to light. Right eye exhibits no discharge. Left eye exhibits no discharge. No scleral icterus.  Neck: Normal range of motion. Neck supple. No thyromegaly present.  Cardiovascular: Normal rate, regular rhythm, normal heart sounds and intact distal pulses.   No murmur heard. The rhythm is regular today at 60/m  Pulmonary/Chest: Effort normal and breath sounds normal. No respiratory distress. She has no wheezes. She has no rales. She exhibits no tenderness.  The chest was clear to auscultation anteriorly and posteriorly  Abdominal: Soft. Bowel sounds are normal. She exhibits no mass. There is no tenderness. There is no rebound and no guarding.  The abdomen is obese with some slight suprapubic tenderness and no bruits or masses being palpable.  Musculoskeletal: Normal range of motion. She exhibits no edema or tenderness.  There is no edema today and the support hose seem to have helped this.  Lymphadenopathy:    She has no cervical adenopathy.  Neurological: She is alert and oriented to person, place, and time.  Skin: Skin is warm and dry. No rash noted.  Psychiatric: She has a normal mood and affect. Her behavior is normal. Judgment and thought content normal.  Nursing note and vitals reviewed.   BP 139/68 mmHg  Pulse 57   Temp(Src) 97.2 F (36.2 C) (Oral)  Ht _0  (1.626 m)  Wt 230 lb (104.327 kg)  BMI 39.46 kg/m2       Assessment & Plan:  1. Dysuria -Call with results of urine culture when available and no further treatment necessary until that is reported back - POCT urinalysis dipstick - POCT UA - Microscopic Only - Urine culture - Vitamin B12 - BMP8+EGFR  2. Essential hypertension -The blood pressure is good today and she will continue with current treatment  3. Degenerative arthritis of thoracic spine -She will take Tylenol if needed for pain  4. Paroxysmal atrial fibrillation (HCC) -The heart rhythm was regular today at 60/m  5. Hypothyroidism due to acquired atrophy of thyroid -Thyroid tests have been checked recently and they were normal.  6. Chest congestion -She will try Delsym if needed for cough and congestion  . Patient Instructions  The patient should drink plenty of fluids and stay well hydrated She should try taking some  Delsym if needed for cough We will call with the urine culture results as soon as that becomes available She should use nasal saline for head congestion   Arrie Senate MD

## 2015-02-26 LAB — BMP8+EGFR
BUN / CREAT RATIO: 18 (ref 11–26)
BUN: 20 mg/dL (ref 8–27)
CHLORIDE: 97 mmol/L (ref 97–106)
CO2: 25 mmol/L (ref 18–29)
CREATININE: 1.14 mg/dL — AB (ref 0.57–1.00)
Calcium: 9.3 mg/dL (ref 8.7–10.3)
GFR calc Af Amer: 52 mL/min/{1.73_m2} — ABNORMAL LOW (ref >59)
GFR calc non Af Amer: 46 mL/min/{1.73_m2} — ABNORMAL LOW (ref >59)
GLUCOSE: 83 mg/dL (ref 65–99)
Potassium: 4.5 mmol/L (ref 3.5–5.2)
SODIUM: 141 mmol/L (ref 136–144)

## 2015-02-26 LAB — VITAMIN B12: VITAMIN B 12: 603 pg/mL (ref 211–946)

## 2015-02-27 LAB — URINE CULTURE

## 2015-03-01 ENCOUNTER — Ambulatory Visit (INDEPENDENT_AMBULATORY_CARE_PROVIDER_SITE_OTHER): Payer: No Typology Code available for payment source | Admitting: Psychology

## 2015-03-01 ENCOUNTER — Other Ambulatory Visit: Payer: Self-pay | Admitting: *Deleted

## 2015-03-01 ENCOUNTER — Encounter (HOSPITAL_COMMUNITY): Payer: Self-pay | Admitting: Psychology

## 2015-03-01 DIAGNOSIS — T7840XD Allergy, unspecified, subsequent encounter: Secondary | ICD-10-CM | POA: Diagnosis not present

## 2015-03-01 DIAGNOSIS — F331 Major depressive disorder, recurrent, moderate: Secondary | ICD-10-CM | POA: Diagnosis not present

## 2015-03-01 MED ORDER — LEVOTHYROXINE SODIUM 137 MCG PO TABS: 137.0000 ug | ORAL_TABLET | Freq: Every day | ORAL | Status: DC

## 2015-03-01 NOTE — Progress Notes (Signed)
The patient reports that she went to live with her grandson for a while, but she started having more issues with caustic smells and irritation of her throat.  She reports that this makes her anxiety worse and anxiety makes these symptoms worse.  The patient reports that she has found a new appartment to live in but realizes that she will likely have similar issues even with a new place.  We talked about the fact that we may never know why she develped these issues but that we were going to have to learn to cope with them and that there was no specific medication that would fix the issue if it is infact multiple chemical sensitivity.    Hershal CoriaRODENBOUGH,JOHN R, PsyD 03/01/2015

## 2015-03-02 ENCOUNTER — Ambulatory Visit (INDEPENDENT_AMBULATORY_CARE_PROVIDER_SITE_OTHER): Payer: Medicare Other | Admitting: Cardiology

## 2015-03-02 ENCOUNTER — Encounter: Payer: Self-pay | Admitting: Cardiology

## 2015-03-02 VITALS — BP 100/68 | HR 63 | Ht 64.0 in | Wt 232.0 lb

## 2015-03-02 DIAGNOSIS — I10 Essential (primary) hypertension: Secondary | ICD-10-CM | POA: Diagnosis not present

## 2015-03-02 DIAGNOSIS — I340 Nonrheumatic mitral (valve) insufficiency: Secondary | ICD-10-CM | POA: Diagnosis not present

## 2015-03-02 NOTE — Progress Notes (Signed)
HPI The patient presents for evaluation of syncope. This was probably related to orthostasis.  She has had some mild edema on echo previously. She has atrial fibrillation but has refused anticoagulation. She does not notice palpitations. She does get dyspnea with exertion which is chronic. Her biggest complaint is she reports that she's had some issue with beta vapors in her apartment. The cause of this could not be determined. She says she has subsequently developed chemical allergies. She now says she's reacted to multiple smells and that she gets short of breath with burning eyes and burning throat. There have been nights when she has slept in her car because she can't tolerate sleeping in her car. She's not describing PND or orthopnea. She says she doesn't eat excessive salt and doesn't drink a lot of fluid but she's had some increased lower extremity swelling. She says she's not sleeping well. She's not having any chest pressure, neck or arm discomfort.    Allergies  Allergen Reactions  . Aspirin Nausea Only  . Codeine Nausea Only  . Sulfa Antibiotics   . Vicodin [Hydrocodone-Acetaminophen] Nausea Only  . Warfarin Sodium Other (See Comments)    Tingling all over    Current Outpatient Prescriptions  Medication Sig Dispense Refill  . calcium-vitamin D (OSCAL WITH D) 500-200 MG-UNIT per tablet Take 1 tablet by mouth daily.    . Cholecalciferol (VITAMIN D3) 2000 UNITS TABS Take 1 tablet by mouth daily.      . ferrous fumarate-iron polysaccharide complex (TANDEM) 162-115.2 MG CAPS Take 1 capsule by mouth daily with breakfast.    . furosemide (LASIX) 40 MG tablet Take 40 mg by mouth daily.    . hydrocortisone (PROCTOCORT) 1 % CREA Apply to area BID PRN 28.4 g 1  . KLOR-CON 10 10 MEQ tablet Take 1 tablet (10 mEq total) by mouth daily. Dispense Klor Con only 90 tablet 3  . lisinopril (PRINIVIL,ZESTRIL) 20 MG tablet Take 1 tablet (20 mg total) by mouth daily. 90 tablet 1  . LORazepam (ATIVAN)  0.5 MG tablet One half tablet to one daily if needed for anxiety 30 tablet 1  . Multiple Vitamins-Minerals (CENTRUM SILVER) tablet Take 1 tablet by mouth daily.     Marland Kitchen levothyroxine (SYNTHROID, LEVOTHROID) 137 MCG tablet Take 1 tablet (137 mcg total) by mouth daily. As directed 90 tablet 3   Current Facility-Administered Medications  Medication Dose Route Frequency Provider Last Rate Last Dose  . cyanocobalamin ((VITAMIN B-12)) injection 1,000 mcg  1,000 mcg Intramuscular Q30 days Ernestina Penna, MD   1,000 mcg at 02/01/15 2841    Past Medical History  Diagnosis Date  . Hypothyroid   . Diaphragmatic hernia without mention of obstruction or gangrene   . Esophagitis   . Hyperlipidemia   . Prolapse of vaginal walls without mention of uterine prolapse   . Arthritis   . First degree atrioventricular block   . AF (atrial fibrillation) (HCC)   . Rectal polyp   . Gastric polyp   . Hypertension     Past Surgical History  Procedure Laterality Date  . Kidney stone surgery    . Dilation and curettage of uterus    . Dilation and curettage of uterus      ROS:  Positive for dizziness, burning eyes, burning mouth, decreased energy, sleeplessness.  Otherwise as stated in the HPI and negative for all other systems.  PHYSICAL EXAM BP 100/68 mmHg  Pulse 63  Ht  (1.626 m)  Wt  232 lb (105.235 kg)  BMI 39.80 kg/m2 GENERAL:  Well appearing NECK:  No jugular venous distention, waveform within normal limits, carotid upstroke brisk and symmetric, no bruits, no thyromegaly LUNGS:  Clear to auscultation bilaterally HEART:  PMI not displaced or sustained,S1 and S2 within normal limits, no S3, no clicks, no rubs, apical holosystolic murmur 2/6, no diastolic murmurs, irregular ABD:  Flat, positive bowel sounds normal in frequency in pitch, no bruits, no rebound, no guarding, no midline pulsatile mass, no hepatomegaly, no splenomegaly EXT:  2 plus pulses throughout, mild edema, no cyanosis no  clubbing   EKG:    Atrial fibrillation, poor anterior R wave progression, left axis deviation, left anterior fascicular block.  03/02/2015   ASSESSMENT AND PLAN   ATRIAL FIBRILLATION:  She has a CHA2DS2 - VASc score of 4 with a risk of stroke of 4%.  Unfortunately despite repeated conversations about her risk and need for anticoagulation she has not wanted to start anticoagulation. Part of this seems to be financial but there is also a reluctance. She understands and accepts the risk of stroke in the absence of anticoagulation.  SYNCOPE:  She has had no further episodes of this. No further workup is planned.  MITRAL REGURGITATION:  Given her dyspnea, increased swelling and previous mild mitral regurgitation I will repeat an echocardiogram.  HTN:  The blood pressure is at target. No change in medications is indicated. We will continue with therapeutic lifestyle changes (TLC).  ALLERGIES:  Her biggest complaint are newly developed allergies which she reports are related to problems in her apartment. She describes having been told she has a new chemical sensitivity. She now reports she is allergic to multiple things. I think is reasonable to refer her for an allergist evaluation. I have given her Dr. Roxy CedarYoung's name.

## 2015-03-02 NOTE — Patient Instructions (Addendum)
Medication Instructions:  The current medical regimen is effective;  continue present plan and medications.  Testing/Procedures: Your physician has requested that you have an echocardiogram. Echocardiography is a painless test that uses sound waves to create images of your heart. It provides your doctor with information about the size and shape of your heart and how well your heart's chambers and valves are working. This procedure takes approximately one hour. There are no restrictions for this procedure.  Please call the office to schedule this appointment  [phone].  Follow-Up: Follow up as needed with Dr Antoine PocheHochrein.  If you need a refill on your cardiac medications before your next appointment, please call your pharmacy.  Dr Fannie Kneelint Young Pulmonologist - for shortness of breath [phone]

## 2015-03-04 ENCOUNTER — Ambulatory Visit (INDEPENDENT_AMBULATORY_CARE_PROVIDER_SITE_OTHER): Payer: Medicare Other | Admitting: *Deleted

## 2015-03-04 DIAGNOSIS — E538 Deficiency of other specified B group vitamins: Secondary | ICD-10-CM

## 2015-03-04 NOTE — Progress Notes (Signed)
Pt given B12 injection IM right deltoid and tolerated well. 

## 2015-03-04 NOTE — Patient Instructions (Signed)

## 2015-03-16 ENCOUNTER — Ambulatory Visit (HOSPITAL_COMMUNITY): Payer: Medicare Other | Attending: Cardiology

## 2015-03-16 ENCOUNTER — Other Ambulatory Visit: Payer: Self-pay

## 2015-03-16 DIAGNOSIS — I10 Essential (primary) hypertension: Secondary | ICD-10-CM | POA: Diagnosis not present

## 2015-03-16 DIAGNOSIS — I071 Rheumatic tricuspid insufficiency: Secondary | ICD-10-CM | POA: Diagnosis not present

## 2015-03-16 DIAGNOSIS — I272 Other secondary pulmonary hypertension: Secondary | ICD-10-CM | POA: Insufficient documentation

## 2015-03-16 DIAGNOSIS — E785 Hyperlipidemia, unspecified: Secondary | ICD-10-CM | POA: Diagnosis not present

## 2015-03-16 DIAGNOSIS — I371 Nonrheumatic pulmonary valve insufficiency: Secondary | ICD-10-CM | POA: Diagnosis not present

## 2015-03-16 DIAGNOSIS — I352 Nonrheumatic aortic (valve) stenosis with insufficiency: Secondary | ICD-10-CM | POA: Insufficient documentation

## 2015-03-16 DIAGNOSIS — I517 Cardiomegaly: Secondary | ICD-10-CM | POA: Insufficient documentation

## 2015-03-16 DIAGNOSIS — R0600 Dyspnea, unspecified: Secondary | ICD-10-CM | POA: Insufficient documentation

## 2015-03-16 DIAGNOSIS — I340 Nonrheumatic mitral (valve) insufficiency: Secondary | ICD-10-CM | POA: Diagnosis not present

## 2015-03-29 ENCOUNTER — Telehealth: Payer: Self-pay | Admitting: Cardiology

## 2015-03-29 NOTE — Telephone Encounter (Signed)
New problem   Pt returning call to discuss echo results. Please call pt.

## 2015-03-29 NOTE — Telephone Encounter (Signed)
Pt aware of results 

## 2015-04-01 ENCOUNTER — Ambulatory Visit (INDEPENDENT_AMBULATORY_CARE_PROVIDER_SITE_OTHER): Payer: No Typology Code available for payment source | Admitting: Psychology

## 2015-04-01 DIAGNOSIS — F331 Major depressive disorder, recurrent, moderate: Secondary | ICD-10-CM

## 2015-04-01 DIAGNOSIS — T7840XD Allergy, unspecified, subsequent encounter: Secondary | ICD-10-CM

## 2015-04-05 ENCOUNTER — Ambulatory Visit (INDEPENDENT_AMBULATORY_CARE_PROVIDER_SITE_OTHER): Payer: Medicare Other | Admitting: *Deleted

## 2015-04-05 DIAGNOSIS — E538 Deficiency of other specified B group vitamins: Secondary | ICD-10-CM | POA: Diagnosis not present

## 2015-04-05 NOTE — Progress Notes (Signed)
Vitamin b12 injection given and tolerated well.  

## 2015-04-05 NOTE — Patient Instructions (Signed)

## 2015-04-11 ENCOUNTER — Encounter: Payer: Self-pay | Admitting: Pulmonary Disease

## 2015-04-11 ENCOUNTER — Ambulatory Visit (INDEPENDENT_AMBULATORY_CARE_PROVIDER_SITE_OTHER): Payer: Medicare Other | Admitting: Pulmonary Disease

## 2015-04-11 VITALS — BP 152/84 | HR 65 | Temp 97.7°F | Ht 64.0 in | Wt 232.8 lb

## 2015-04-11 DIAGNOSIS — R0600 Dyspnea, unspecified: Secondary | ICD-10-CM

## 2015-04-11 DIAGNOSIS — R0689 Other abnormalities of breathing: Secondary | ICD-10-CM | POA: Diagnosis not present

## 2015-04-11 MED ORDER — ALBUTEROL SULFATE HFA 108 (90 BASE) MCG/ACT IN AERS: 2.0000 | INHALATION_SPRAY | RESPIRATORY_TRACT | Status: DC | PRN

## 2015-04-11 NOTE — Patient Instructions (Addendum)
We will start you on on albuterol rescue inhaler. Schedule you for PFTs.  Return to clinic in 3 months

## 2015-04-11 NOTE — Progress Notes (Signed)
Subjective:    Patient ID: [name], female    DOB: 1932-07-24, 82 y.o.   MRN: [id number]  HPI  Consult for evaluation of dyspnea.  Mrs. [name] is a 82 year old with history as below. She is referred by Dr. Antoine PocheHochrein for increasing dyspnea. She describes allergy type symptoms that's related to exposures in her apartment. She reports having new smells causing increasing dyspnea, right-sided pleuritic chest pain, wheezing. She denies any cough, sputum production, fevers, chills, hemoptysis.  She is lifelong nonsmoker. She does not have any relevant exposures at work.  Past Medical History  Diagnosis Date  . Hypothyroid   . Diaphragmatic hernia without mention of obstruction or gangrene   . Esophagitis   . Hyperlipidemia   . Prolapse of vaginal walls without mention of uterine prolapse   . Arthritis   . First degree atrioventricular block   . AF (atrial fibrillation) (HCC)   . Rectal polyp   . Gastric polyp   . Hypertension     Current outpatient prescriptions:  .  calcium-vitamin D (OSCAL WITH D) 500-200 MG-UNIT per tablet, Take 1 tablet by mouth daily., Disp: , Rfl:  .  Cholecalciferol (VITAMIN D3) 2000 UNITS TABS, Take 1 tablet by mouth daily.  , Disp: , Rfl:  .  ferrous fumarate-iron polysaccharide complex (TANDEM) 162-115.2 MG CAPS, Take 1 capsule by mouth daily with breakfast., Disp: , Rfl:  .  furosemide (LASIX) 40 MG tablet, Take 40 mg by mouth daily., Disp: , Rfl:  .  hydrocortisone (PROCTOCORT) 1 % CREA, Apply to area BID PRN, Disp: 28.4 g, Rfl: 1 .  KLOR-CON 10 10 MEQ tablet, Take 1 tablet (10 mEq total) by mouth daily. Dispense Klor Con only, Disp: 90 tablet, Rfl: 3 .  levothyroxine (SYNTHROID, LEVOTHROID) 137 MCG tablet, Take 1 tablet (137 mcg total) by mouth daily. As directed, Disp: 90 tablet, Rfl: 3 .  lisinopril (PRINIVIL,ZESTRIL) 20 MG tablet, Take 1 tablet (20 mg total) by mouth daily., Disp: 90 tablet, Rfl: 1 .  LORazepam (ATIVAN) 0.5 MG tablet, One half tablet  to one daily if needed for anxiety, Disp: 30 tablet, Rfl: 1 .  Multiple Vitamins-Minerals (CENTRUM SILVER) tablet, Take 1 tablet by mouth daily. , Disp: , Rfl:  .  albuterol (PROVENTIL HFA;VENTOLIN HFA) 108 (90 Base) MCG/ACT inhaler, Inhale 2 puffs into the lungs every 4 (four) hours as needed for wheezing or shortness of breath., Disp: 8 g, Rfl: 5  Current facility-administered medications:  .  cyanocobalamin ((VITAMIN B-12)) injection 1,000 mcg, 1,000 mcg, Intramuscular, Q30 days, Ernestina Pennaonald W Moore, MD, 1,000 mcg at 04/05/15 [id number]  Review of Systems Dyspnea, wheezing Denies any chest pain, palpitations,  No fevers, chills, nausea, vomiting, diarrhea, constipation. All other review of systems are negative.   Objective:   Physical Exam Blood pressure 152/84, pulse 65, temperature 97.7 F (36.5 C), temperature source Oral, height 5\' 4"  (1.626 m), weight 232 lb 12.8 oz (105.597 kg), SpO2 97 %.  Gen: No apparent distress Neuro: No gross focal deficits. Neck: No JVD, lymphadenopathy, thyromegaly. RS: Clear, No wheeze or crackles. CVS: S1-S2 heard, no murmurs rubs gallops. Abdomen: Soft, positive bowel sounds. Extremities: No edema.    Assessment & Plan:  Evaluation for Dyspnea.   Patient has acute onset of dyspnea, sensitivity to fumes exposure. She may be developing reactive airway disease, asthma but it is unusual for her to develop these symptoms at such an advanced age. We will start on an albuterol rescue inhaler, she'll get PFTs with  bronchodilator response. She is advised to avoid exposures at home. She may need a methacholine challenge test, skin testing for allergies in the future.  Plan:  - PFTs with bronchodilator response - Albuterol rescue inhaler   Chilton Greathouse MD [facility] Pager [phone] If no answer or after 3pm call: [phone] 04/11/2015, 2:58 PM

## 2015-04-11 NOTE — Addendum Note (Signed)
Addended by: Boone MasterJONES, JESSICA E on: 04/11/2015 04:00 PM   Modules accepted: Orders
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
## 2015-04-13 ENCOUNTER — Ambulatory Visit (INDEPENDENT_AMBULATORY_CARE_PROVIDER_SITE_OTHER): Payer: Medicare Other | Admitting: Pulmonary Disease

## 2015-04-13 DIAGNOSIS — R0689 Other abnormalities of breathing: Secondary | ICD-10-CM

## 2015-04-13 DIAGNOSIS — R0600 Dyspnea, unspecified: Secondary | ICD-10-CM

## 2015-04-13 LAB — PULMONARY FUNCTION TEST
DL/VA % pred: 82 %
DL/VA: 4.03 ml/min/mmHg/L
DLCO unc % pred: 50 %
DLCO unc: 12.49 ml/min/mmHg
FEF 25-75 Post: 0.76 L/sec
FEF 25-75 Pre: 0.65 L/sec
FEF2575-%CHANGE-POST: 17 %
FEF2575-%Pred-Post: 54 %
FEF2575-%Pred-Pre: 46 %
FEV1-%Change-Post: 4 %
FEV1-%PRED-POST: 57 %
FEV1-%Pred-Pre: 54 %
FEV1-POST: 1.12 L
FEV1-PRE: 1.07 L
FEV1FVC-%CHANGE-POST: 5 %
FEV1FVC-%Pred-Pre: 96 %
FEV6-%Change-Post: -2 %
FEV6-%PRED-PRE: 60 %
FEV6-%Pred-Post: 59 %
FEV6-Post: 1.47 L
FEV6-Pre: 1.5 L
FEV6FVC-%Change-Post: -1 %
FEV6FVC-%PRED-PRE: 105 %
FEV6FVC-%Pred-Post: 103 %
FVC-%CHANGE-POST: 0 %
FVC-%PRED-PRE: 57 %
FVC-%Pred-Post: 56 %
FVC-POST: 1.49 L
FVC-PRE: 1.5 L
POST FEV6/FVC RATIO: 99 %
PRE FEV6/FVC RATIO: 100 %
Post FEV1/FVC ratio: 75 %
Pre FEV1/FVC ratio: 71 %
RV % PRED: 75 %
RV: 1.84 L
TLC % pred: 66 %
TLC: 3.43 L

## 2015-04-13 NOTE — Progress Notes (Signed)
PFT done today. 

## 2015-04-16 ENCOUNTER — Ambulatory Visit (INDEPENDENT_AMBULATORY_CARE_PROVIDER_SITE_OTHER): Payer: Medicare Other | Admitting: Family Medicine

## 2015-04-16 ENCOUNTER — Encounter: Payer: Self-pay | Admitting: Family Medicine

## 2015-04-16 VITALS — BP 135/73 | HR 56 | Temp 97.0°F | Ht 64.0 in | Wt 226.2 lb

## 2015-04-16 DIAGNOSIS — R300 Dysuria: Secondary | ICD-10-CM

## 2015-04-16 DIAGNOSIS — M545 Low back pain, unspecified: Secondary | ICD-10-CM

## 2015-04-16 LAB — POCT UA - MICROSCOPIC ONLY
Bacteria, U Microscopic: NEGATIVE
Casts, Ur, LPF, POC: NEGATIVE
Crystals, Ur, HPF, POC: NEGATIVE
MUCUS UA: NEGATIVE
RBC, urine, microscopic: NEGATIVE
YEAST UA: NEGATIVE

## 2015-04-16 LAB — POCT URINALYSIS DIPSTICK
BILIRUBIN UA: NEGATIVE
Blood, UA: NEGATIVE
GLUCOSE UA: NEGATIVE
KETONES UA: NEGATIVE
Leukocytes, UA: NEGATIVE
Nitrite, UA: NEGATIVE
Protein, UA: NEGATIVE
Urobilinogen, UA: NEGATIVE
pH, UA: 8

## 2015-04-16 MED ORDER — TIZANIDINE HCL 2 MG PO TABS: 2.0000 mg | ORAL_TABLET | Freq: Three times a day (TID) | ORAL | Status: DC | PRN

## 2015-04-16 NOTE — Progress Notes (Signed)
BP 135/73 mmHg  Pulse 56  Temp(Src) 97 F (36.1 C) (Oral)  Ht 5\' 4"  (1.626 m)  Wt 226 lb 3.2 oz (102.604 kg)  BMI 38.81 kg/m2   Subjective:    Patient ID: Mckinlee Dunk, female    DOB: 22-Aug-1932, 82 y.o.   MRN: [id number]  HPI: Darcee Dekker is a 82 y.o. female presenting on 04/16/2015 for Urinary Tract Infection   HPI Flank pain Patient has been having bilateral burning pain in her back that she feels or darkened urine or suprapubic. she denies any fevers or chills. He does have a vague abdominal pain that she gets intermittently. This is been going on for the past day. She denies any nausea or vomiting or diarrhea or constipation. She denies any vaginal discharge or bleeding.  Relevant past medical, surgical, family and social history reviewed and updated as indicated. Interim medical history since our last visit reviewed. Allergies and medications reviewed and updated.  Review of Systems  Constitutional: Negative for fever and chills.  HENT: Negative for congestion, ear discharge and ear pain.   Eyes: Negative for redness and visual disturbance.  Respiratory: Negative for chest tightness and shortness of breath.   Cardiovascular: Negative for chest pain and leg swelling.  Gastrointestinal: Positive for abdominal pain (diffuse). Negative for nausea, vomiting, diarrhea, constipation, blood in stool, abdominal distention and anal bleeding.  Genitourinary: Positive for flank pain. Negative for dysuria, urgency, frequency, hematuria, decreased urine volume, vaginal bleeding, vaginal discharge, difficulty urinating and vaginal pain.  Musculoskeletal: Negative for back pain and gait problem.  Skin: Negative for rash.  Neurological: Negative for light-headedness and headaches.  Psychiatric/Behavioral: Negative for behavioral problems and agitation.  All other systems reviewed and are negative.   Per HPI unless specifically indicated above     Medication List       This list is  accurate as of: 04/16/15  8:50 AM.  Always use your most recent med list.               albuterol 108 (90 Base) MCG/ACT inhaler  Commonly known as:  PROVENTIL HFA;VENTOLIN HFA  Inhale 2 puffs into the lungs every 4 (four) hours as needed for wheezing or shortness of breath.     calcium-vitamin D 500-200 MG-UNIT tablet  Commonly known as:  OSCAL WITH D  Take 1 tablet by mouth daily.     CENTRUM SILVER tablet  Take 1 tablet by mouth daily.     ferrous fumarate-iron polysaccharide complex 162-115.2 MG Caps capsule  Commonly known as:  TANDEM  Take 1 capsule by mouth daily with breakfast.     furosemide 40 MG tablet  Commonly known as:  LASIX  Take 40 mg by mouth daily.     hydrocortisone 1 % Crea  Commonly known as:  PROCTOCORT  Apply to area BID PRN     KLOR-CON 10 10 MEQ tablet  Generic drug:  potassium chloride  Take 1 tablet (10 mEq total) by mouth daily. Dispense Klor Con only     levothyroxine 137 MCG tablet  Commonly known as:  SYNTHROID, LEVOTHROID  Take 1 tablet (137 mcg total) by mouth daily. As directed     lisinopril 20 MG tablet  Commonly known as:  PRINIVIL,ZESTRIL  Take 1 tablet (20 mg total) by mouth daily.     LORazepam 0.5 MG tablet  Commonly known as:  ATIVAN  One half tablet to one daily if needed for anxiety     tiZANidine 2 MG  tablet  Commonly known as:  ZANAFLEX  Take 1 tablet (2 mg total) by mouth every 8 (eight) hours as needed for muscle spasms.     Vitamin D3 2000 units Tabs  Take 1 tablet by mouth daily.           Objective:    BP 135/73 mmHg  Pulse 56  Temp(Src) 97 F (36.1 C) (Oral)  Ht 5\' 4"  (1.626 m)  Wt 226 lb 3.2 oz (102.604 kg)  BMI 38.81 kg/m2  Wt Readings from Last 3 Encounters:  04/16/15 226 lb 3.2 oz (102.604 kg)  04/11/15 232 lb 12.8 oz (105.597 kg)  03/02/15 232 lb (105.235 kg)    Physical Exam  Constitutional: She is oriented to person, place, and time. She appears well-developed and well-nourished. No  distress.  Eyes: Conjunctivae and EOM are normal.  Neck: Neck supple. No thyromegaly present.  Cardiovascular: Normal rate, regular rhythm, normal heart sounds and intact distal pulses.   No murmur heard. Pulmonary/Chest: Effort normal and breath sounds normal. No respiratory distress. She has no wheezes.  Abdominal: Soft. Bowel sounds are normal. She exhibits no distension. There is tenderness (Vague generalized abdominal pain on exam). There is CVA tenderness (Tenderness in mid back, but not necessarily significant of CVA tenderness on exam because also tender to light palpation). There is no rigidity, no rebound, no guarding, no tenderness at McBurney's point and negative Murphy's sign.  Musculoskeletal: Normal range of motion. She exhibits no edema or tenderness.  Lymphadenopathy:    She has no cervical adenopathy.  Neurological: She is alert and oriented to person, place, and time. Coordination normal.  Skin: Skin is warm and dry. No rash noted. She is not diaphoretic.  Psychiatric: She has a normal mood and affect. Her behavior is normal.  Nursing note and vitals reviewed.   Results for orders placed or performed in visit on 04/16/15  POCT UA - Microscopic Only  Result Value Ref Range   WBC, Ur, HPF, POC occ    RBC, urine, microscopic neg    Bacteria, U Microscopic neg    Mucus, UA neg    Epithelial cells, urine per micros few    Crystals, Ur, HPF, POC neg    Casts, Ur, LPF, POC neg    Yeast, UA neg   POCT urinalysis dipstick  Result Value Ref Range   Color, UA clr    Clarity, UA clr    Glucose, UA neg    Bilirubin, UA neg    Ketones, UA neg    Spec Grav, UA <=1.005    Blood, UA neg    pH, UA 8.0    Protein, UA neg    Urobilinogen, UA negative    Nitrite, UA neg    Leukocytes, UA Negative Negative      Assessment & Plan:   Problem List Items Addressed This Visit    None    Visit Diagnoses    Dysuria    -  Primary    Relevant Orders    POCT UA - Microscopic  Only (Completed)    POCT urinalysis dipstick (Completed)    Urine culture    Bilateral low back pain without sciatica        Relevant Medications    tiZANidine (ZANAFLEX) 2 MG tablet        Follow up plan: Return if symptoms worsen or fail to improve.  Counseling provided for all of the vaccine components Orders Placed This Encounter  Procedures  .  Urine culture  . POCT UA - Microscopic Only  . POCT urinalysis dipstick    Arville Care, MD [facility] 04/16/2015, 8:50 AM

## 2015-04-18 ENCOUNTER — Ambulatory Visit: Payer: Medicare Other | Admitting: Family Medicine

## 2015-04-21 ENCOUNTER — Encounter: Payer: Self-pay | Admitting: Family Medicine

## 2015-04-21 ENCOUNTER — Ambulatory Visit (INDEPENDENT_AMBULATORY_CARE_PROVIDER_SITE_OTHER): Payer: Medicare Other | Admitting: Family Medicine

## 2015-04-21 VITALS — BP 132/70 | HR 53 | Temp 96.8°F | Ht 64.0 in | Wt 226.0 lb

## 2015-04-21 DIAGNOSIS — I700 Atherosclerosis of aorta: Secondary | ICD-10-CM

## 2015-04-21 DIAGNOSIS — I10 Essential (primary) hypertension: Secondary | ICD-10-CM | POA: Diagnosis not present

## 2015-04-21 DIAGNOSIS — R609 Edema, unspecified: Secondary | ICD-10-CM

## 2015-04-21 DIAGNOSIS — E559 Vitamin D deficiency, unspecified: Secondary | ICD-10-CM | POA: Diagnosis not present

## 2015-04-21 DIAGNOSIS — E038 Other specified hypothyroidism: Secondary | ICD-10-CM

## 2015-04-21 DIAGNOSIS — E034 Atrophy of thyroid (acquired): Secondary | ICD-10-CM

## 2015-04-21 DIAGNOSIS — E785 Hyperlipidemia, unspecified: Secondary | ICD-10-CM

## 2015-04-21 NOTE — Patient Instructions (Addendum)
Medicare Annual Wellness Visit   and the medical providers at [facility] strive to bring you the best medical care.  In doing so we not only want to address your current medical conditions and concerns but also to detect new conditions early and prevent illness, disease and health-related problems.    Medicare offers a yearly Wellness Visit which allows our clinical staff to assess your need for preventative services including immunizations, lifestyle education, counseling to decrease risk of preventable diseases and screening for fall risk and other medical concerns.    This visit is provided free of charge (no copay) for all Medicare recipients. The clinical pharmacists at [facility] have begun to conduct these Wellness Visits which will also include a thorough review of all your medications.    As you primary medical provider recommend that you make an appointment for your Annual Wellness Visit if you have not done so already this year.  You may set up this appointment before you leave today or you may call back ([phone]) and schedule an appointment.  Please make sure when you call that you mention that you are scheduling your Annual Wellness Visit with the clinical pharmacist so that the appointment may be made for the proper length of time.     Continue current medications. Continue good therapeutic lifestyle changes which include good diet and exercise. Fall precautions discussed with patient. If an FOBT was given today- please return it to our front desk. If you are over 31 years old - you may need Prevnar 13 or the adult Pneumonia vaccine.  **Flu shots are available--- please call and schedule a FLU-CLINIC appointment**  After your visit with [place] today you will receive a survey in the mail or online from [employer] regarding your care with [place]. Please take a moment to fill this out. Your feedback is very  important to [place] as you can help [place] better understand your patient needs as well as improve your experience and satisfaction. WE CARE ABOUT YOU!!!   The patient should apply warm wet compresses to the left shoulder 20 minutes 3 or 4 times daily and do gentle range of motion exercises with this She should take extra strength Tylenol 3 or 4 times daily if needed for severe pain If the shoulder is not better within 4 weeks' time and she should call us and we should get further x-rays at that time She does have some edema today and she should limit her sodium intake and she says that she can do better with this. We will call her with her lab work results once these results become available She also needs to get her eye exam.

## 2015-04-21 NOTE — Progress Notes (Signed)
Subjective:    Patient ID: [name], female    DOB: 02-23-1933, 82 y.o.   MRN: [id number]  HPI Pt here for follow up and management of chronic medical problems which includes hypothyroid and hyperlipidemia. She is taking medications regularly. Recent pulmonology note was reviewed and the patient is seeing the pulmonologist to help try to sort out what could be going on with her lungs to make her sensitive to environment and further testing is still pending. The patient denies any chest pain or significant shortness of breath. She does complain of some left shoulder pain and she did have a fall in October and she had a severe fall in 2014 and had fractures and an injured shoulder that time. She does  not recall lifting anything or re-hurting it other than a fall in October but it is bothering her more right now the past few days. She does have occasional heartburn , but no trouble swallowing and no blood in the stool or black tarry bowel movements or problems passing her water. With her sensitivity to smells as mentioned above she is seeing the pulmonologist and he is working this problem up further. She has moved to a different apartment and she thinks this has helped some. I did encourage her during our conversation that when the weather is warm to open up the windows and let the apartment get plenty of a hair.      Patient Active Problem List   Diagnosis Date Noted  . Hearing loss 01/06/2015  . Thoracic aorta atherosclerosis ([place]) 03/04/2014  . Degenerative arthritis of thoracic spine 03/04/2014  . Osteoporosis, post-menopausal 08/12/2013  . Metabolic syndrome 31/49/7026  . Anemia, iron deficiency 03/16/2013  . Vitamin D deficiency 03/16/2013  . HTN (hypertension) 03/16/2013  . Hypothyroidism   . Urinary tract infection, site not specified   . Diaphragmatic hernia without mention of obstruction or gangrene   . Esophagitis   . Hyperlipidemia   . Prolapse of vaginal walls without  mention of uterine prolapse   . Arthritis   . First degree atrioventricular block   . Symptomatic menopausal or female climacteric states   . AF (atrial fibrillation) ([place])   . Rectal polyp   . Gastric polyp    Outpatient Encounter Prescriptions as of 04/21/2015  Medication Sig  . albuterol (PROVENTIL HFA;VENTOLIN HFA) 108 (90 Base) MCG/ACT inhaler Inhale 2 puffs into the lungs every 4 (four) hours as needed for wheezing or shortness of breath.  . calcium-vitamin D (OSCAL WITH D) 500-200 MG-UNIT per tablet Take 1 tablet by mouth daily.  . Cholecalciferol (VITAMIN D3) 2000 UNITS TABS Take 1 tablet by mouth daily.    . ferrous fumarate-iron polysaccharide complex (TANDEM) 162-115.2 MG CAPS Take 1 capsule by mouth daily with breakfast.  . furosemide (LASIX) 40 MG tablet Take 40 mg by mouth daily.  . hydrocortisone (PROCTOCORT) 1 % CREA Apply to area BID PRN  . KLOR-CON 10 10 MEQ tablet Take 1 tablet (10 mEq total) by mouth daily. Dispense Klor Con only  . levothyroxine (SYNTHROID, LEVOTHROID) 137 MCG tablet Take 1 tablet (137 mcg total) by mouth daily. As directed  . lisinopril (PRINIVIL,ZESTRIL) 20 MG tablet Take 1 tablet (20 mg total) by mouth daily.  Marland Kitchen LORazepam (ATIVAN) 0.5 MG tablet One half tablet to one daily if needed for anxiety  . Multiple Vitamins-Minerals (CENTRUM SILVER) tablet Take 1 tablet by mouth daily.   Marland Kitchen tiZANidine (ZANAFLEX) 2 MG tablet Take 1 tablet (2 mg total)  by mouth every 8 (eight) hours as needed for muscle spasms.   Facility-Administered Encounter Medications as of 04/21/2015  Medication  . cyanocobalamin ((VITAMIN B-12)) injection 1,000 mcg     Review of Systems  Constitutional: Negative.   HENT: Negative.   Eyes: Negative.   Respiratory: Negative.        Sensitive to smells  Cardiovascular: Negative.   Gastrointestinal: Negative.   Endocrine: Negative.   Genitourinary: Negative.   Musculoskeletal: Positive for arthralgias (left shoudler pain).  Skin:  Negative.   Allergic/Immunologic: Negative.   Neurological: Negative.   Hematological: Negative.   Psychiatric/Behavioral: Negative.        Objective:   Physical Exam  Constitutional: She is oriented to person, place, and time. She appears well-developed and well-nourished. No distress.  HENT:  Head: Normocephalic and atraumatic.  Right Ear: External ear normal.  Left Ear: External ear normal.  Nose: Nose normal.  Mouth/Throat: Oropharynx is clear and moist.  Eyes: Conjunctivae and EOM are normal. Pupils are equal, round, and reactive to light. Right eye exhibits no discharge. Left eye exhibits no discharge. No scleral icterus.  Neck: Normal range of motion. Neck supple. No thyromegaly present.  Cardiovascular: Normal rate, regular rhythm, normal heart sounds and intact distal pulses.   No murmur heard. Pulmonary/Chest: Effort normal and breath sounds normal. No respiratory distress. She has no wheezes. She has no rales. She exhibits no tenderness.  Clear anteriorly and posteriorly  Abdominal: Soft. Bowel sounds are normal. She exhibits no mass. There is tenderness. There is no rebound and no guarding.  Slight tenderness below the umbilicus and in the suprapubic area. No organ enlargement or masses. She does have abdominal obesity. Her weight is asked a down some despite some of the edema that she has.  Musculoskeletal: Normal range of motion. She exhibits edema and tenderness.  There is some slight tenderness in the left acromioclavicular joint and the left suprascapular area and the deltoid area. Range of motion is good but some discomfort with movement. There is edema in both feet and she is wearing support stockings.  Lymphadenopathy:    She has no cervical adenopathy.  Neurological: She is alert and oriented to person, place, and time. She has normal reflexes. No cranial nerve deficit.  Skin: Skin is warm and dry. No rash noted.  Psychiatric: She has a normal mood and affect. Her  behavior is normal. Judgment and thought content normal.  Nursing note and vitals reviewed. BP 132/70 mmHg  Pulse 53  Temp(Src) 96.8 F (36 C) (Oral)  Ht '5\' 4"'  (1.626 m)  Wt 226 lb (102.513 kg)  BMI 38.77 kg/m2         Assessment & Plan:  1. Essential hypertension -Blood pressure is good today and the patient will continue with her current treatment - BMP8+EGFR - CBC with Differential/Platelet - Hepatic function panel  2. Hypothyroidism due to acquired atrophy of thyroid -She has no increasing complaints of fatigue and we will continue with her current thyroid treatment pending results of lab work - CBC with Differential/Platelet - Thyroid Panel With TSH  3. Thoracic aorta atherosclerosis ([place]) -She will continue with aggressive therapeutic lifestyle changes with watching her cholesterol and getting as much exercise as possible - CBC with Differential/Platelet - NMR, lipoprofile  4. Vitamin D deficiency -Continue current treatment pending results of lab work - CBC with Differential/Platelet - VITAMIN D 25 Hydroxy (Vit-D Deficiency, Fractures)  5. Hyperlipidemia -Continue good diet habits with cholesterol management - CBC with  Differential/Platelet - NMR, lipoprofile  6. Edema, unspecified type -Reduce sodium intake and continue with support hose  Patient Instructions                       Medicare Annual Wellness Visit  [facility] and the medical providers at [facility] strive to bring you the best medical care.  In doing so we not only want to address your current medical conditions and concerns but also to detect new conditions early and prevent illness, disease and health-related problems.    Medicare offers a yearly Wellness Visit which allows our clinical staff to assess your need for preventative services including immunizations, lifestyle education, counseling to decrease risk of preventable diseases and screening for fall risk and  other medical concerns.    This visit is provided free of charge (no copay) for all Medicare recipients. The clinical pharmacists at [facility] have begun to conduct these Wellness Visits which will also include a thorough review of all your medications.    As you primary medical provider recommend that you make an appointment for your Annual Wellness Visit if you have not done so already this year.  You may set up this appointment before you leave today or you may call back [phone]) and schedule an appointment.  Please make sure when you call that you mention that you are scheduling your Annual Wellness Visit with the clinical pharmacist so that the appointment may be made for the proper length of time.     Continue current medications. Continue good therapeutic lifestyle changes which include good diet and exercise. Fall precautions discussed with patient. If an FOBT was given today- please return it to our front desk. If you are over 34 years old - you may need Prevnar 41 or the adult Pneumonia vaccine.  **Flu shots are available--- please call and schedule a FLU-CLINIC appointment**  After your visit with [place] today you will receive a survey in the mail or online from [employer] regarding your care with [place]. Please take a moment to fill this out. Your feedback is very important to [place] as you can help [place] better understand your patient needs as well as improve your experience and satisfaction. WE CARE ABOUT YOU!!!   The patient should apply warm wet compresses to the left shoulder 20 minutes 3 or 4 times daily and do gentle range of motion exercises with this She should take extra strength Tylenol 3 or 4 times daily if needed for severe pain If the shoulder is not better within 4 weeks' time and she should call us and we should get further x-rays at that time She does have some edema today and she should limit her sodium intake and she says that she can do better with  this. We will call her with her lab work results once these results become available She also needs to get her eye exam.   Arrie Senate MD

## 2015-04-22 LAB — THYROID PANEL WITH TSH
Free Thyroxine Index: 2.7 (ref 1.2–4.9)
T3 UPTAKE RATIO: 30 % (ref 24–39)
T4, Total: 9 ug/dL (ref 4.5–12.0)
TSH: 2.21 u[IU]/mL (ref 0.450–4.500)

## 2015-04-22 LAB — HEPATIC FUNCTION PANEL
ALT: 13 IU/L (ref 0–32)
AST: 17 IU/L (ref 0–40)
Albumin: 4.5 g/dL (ref 3.5–4.7)
Alkaline Phosphatase: 98 IU/L (ref 39–117)
Bilirubin Total: 0.6 mg/dL (ref 0.0–1.2)
Bilirubin, Direct: 0.16 mg/dL (ref 0.00–0.40)
Total Protein: 7.3 g/dL (ref 6.0–8.5)

## 2015-04-22 LAB — CBC WITH DIFFERENTIAL/PLATELET
Basophils Absolute: 0 x10E3/uL (ref 0.0–0.2)
Basos: 1 %
EOS (ABSOLUTE): 0.3 x10E3/uL (ref 0.0–0.4)
Eos: 6 %
Hematocrit: 34.9 % (ref 34.0–46.6)
Hemoglobin: 11.5 g/dL (ref 11.1–15.9)
Immature Grans (Abs): 0 x10E3/uL (ref 0.0–0.1)
Immature Granulocytes: 0 %
Lymphocytes Absolute: 1.2 x10E3/uL (ref 0.7–3.1)
Lymphs: 24 %
MCH: 30.6 pg (ref 26.6–33.0)
MCHC: 33 g/dL (ref 31.5–35.7)
MCV: 93 fL (ref 79–97)
Monocytes Absolute: 0.5 x10E3/uL (ref 0.1–0.9)
Monocytes: 10 %
Neutrophils Absolute: 2.9 x10E3/uL (ref 1.4–7.0)
Neutrophils: 59 %
Platelets: 217 x10E3/uL (ref 150–379)
RBC: 3.76 x10E6/uL — ABNORMAL LOW (ref 3.77–5.28)
RDW: 14 % (ref 12.3–15.4)
WBC: 4.9 x10E3/uL (ref 3.4–10.8)

## 2015-04-22 LAB — BMP8+EGFR
BUN/Creatinine Ratio: 22 (ref 11–26)
BUN: 23 mg/dL (ref 8–27)
CO2: 29 mmol/L (ref 18–29)
Calcium: 9.8 mg/dL (ref 8.7–10.3)
Chloride: 97 mmol/L (ref 96–106)
Creatinine, Ser: 1.04 mg/dL — ABNORMAL HIGH (ref 0.57–1.00)
GFR calc Af Amer: 59 mL/min/1.73 — ABNORMAL LOW
GFR calc non Af Amer: 51 mL/min/1.73 — ABNORMAL LOW
Glucose: 92 mg/dL (ref 65–99)
Potassium: 4.7 mmol/L (ref 3.5–5.2)
Sodium: 141 mmol/L (ref 134–144)

## 2015-04-22 LAB — NMR, LIPOPROFILE
Cholesterol: 179 mg/dL (ref 100–199)
HDL Cholesterol by NMR: 54 mg/dL
HDL Particle Number: 35.8 umol/L
LDL Particle Number: 1132 nmol/L — ABNORMAL HIGH
LDL Size: 20.8 nm
LDL-C: 95 mg/dL (ref 0–99)
LP-IR Score: 55 — ABNORMAL HIGH
Small LDL Particle Number: 312 nmol/L
Triglycerides by NMR: 149 mg/dL (ref 0–149)

## 2015-04-22 LAB — VITAMIN D 25 HYDROXY (VIT D DEFICIENCY, FRACTURES): VIT D 25 HYDROXY: 50.7 ng/mL (ref 30.0–100.0)

## 2015-05-06 ENCOUNTER — Ambulatory Visit (INDEPENDENT_AMBULATORY_CARE_PROVIDER_SITE_OTHER): Payer: Medicare Other | Admitting: *Deleted

## 2015-05-06 DIAGNOSIS — E538 Deficiency of other specified B group vitamins: Secondary | ICD-10-CM | POA: Diagnosis not present

## 2015-05-06 NOTE — Patient Instructions (Signed)

## 2015-05-06 NOTE — Progress Notes (Signed)
Pt given B12 injection IM right deltoid and tolerated well. 

## 2015-05-18 ENCOUNTER — Telehealth: Payer: Self-pay | Admitting: Pulmonary Disease

## 2015-05-18 NOTE — Progress Notes (Signed)
Quick Note:  LMOM TCB x1. ______ 

## 2015-05-18 NOTE — Telephone Encounter (Signed)
LVM for pt to return call

## 2015-05-19 NOTE — Telephone Encounter (Signed)
Pt returned call. I reviewed results and recs. Pt states that her breathing is doing okay and she will wait for the next ov on 07/20/15 at 11am. She voiced understanding and had no further questions. Nothing further needed at this time.

## 2015-05-19 NOTE — Telephone Encounter (Signed)
Notes Recorded by Chilton Greathouse, MD on 05/16/2015 at 4:25 PM Please let the patient know that the PFTs reduction in lung volumes. If her breathing is worse then she will need a CT of the chest otherwise continue current treatment. I will reassess at time of next visit.  LVM for pt to return call at [phone] Attempted to call pt at [phone]. Unable to LVM. No VM set up.

## 2015-05-19 NOTE — Telephone Encounter (Signed)
Patient returned call, CB is [phone] or [phone].

## 2015-05-19 NOTE — Progress Notes (Signed)
Quick Note:  LVM for pt to return call ______ 

## 2015-05-19 NOTE — Progress Notes (Signed)
Quick Note:  Pt returned call. Reviewed results and recs. She states that her breathing is doing okay and she will wait till her next ov on 07/20/15 at 11am. Pt voiced understanding and had no further questions. ______

## 2015-05-27 ENCOUNTER — Encounter (HOSPITAL_COMMUNITY): Payer: Self-pay | Admitting: Psychology

## 2015-05-27 NOTE — Progress Notes (Signed)
The patient continues to have a great deal of difficulty with regard to reacting to various chemicals and smells. We talked about this in depth and talked about the possibility that this has nothing to do with particular individual sites but has to do with the way her body is reacting to various smells.   Hershal Coria, PsyD 05/27/2015

## 2015-06-01 ENCOUNTER — Encounter: Payer: Self-pay | Admitting: Family Medicine

## 2015-06-01 ENCOUNTER — Ambulatory Visit (INDEPENDENT_AMBULATORY_CARE_PROVIDER_SITE_OTHER): Payer: Medicare Other | Admitting: Family Medicine

## 2015-06-01 VITALS — BP 153/70 | HR 54 | Temp 97.4°F | Ht 64.0 in | Wt 229.4 lb

## 2015-06-01 DIAGNOSIS — L03012 Cellulitis of left finger: Secondary | ICD-10-CM | POA: Diagnosis not present

## 2015-06-01 MED ORDER — DOXYCYCLINE HYCLATE 100 MG PO TABS: 100.0000 mg | ORAL_TABLET | Freq: Two times a day (BID) | ORAL | Status: DC

## 2015-06-01 NOTE — Progress Notes (Signed)
   HPI  Patient presents today with concern for infected finger.  Patient explains that she was planting some flowers about 5 days ago. The day after she developed left-sided point her finger redness and pain. Increased throughout the following days and seemed to be the worse yesterday. She describes swelling, throbbing pain, and warmth at the area.  She does not believe that anything was left inside the finger, she did get a thorn in the finger which was removed. She has had slight nausea today. No fevers, chills, sweats. 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  PMH: Smoking status noted ROS: Per HPI  Objective: BP 153/70 mmHg  Pulse 54  Temp(Src) 97.4 F (36.3 C) (Oral)  Ht  (1.626 m)  Wt 229 lb 6.4 oz (104.055 kg)  BMI 39.36 kg/m2 Gen: NAD, alert, cooperative with exam HEENT: NCAT CV: RRR, good S1/S2, no murmur Resp: CTABL, no wheezes, non-labored Ext: Left point her finger erythema and swelling, starting with the base of the nail plate extending down to a half centimeters and across the finger she has swelling, warmth, and redness Neuro: Alert and oriented, No gross deficits  Assessment and plan:  # Cellulitis No clear abscess, will treat with doxycycline for 7 days. She has a history of MRSA infections, so I'm covering for MRSA. Discussed supportive care including warm compresses. Low threshold for return if does not improve or has quit return after the antibiotics are finished. Discussed probiotic or yogurt daily while she is on this and   Murtis Sink, MD [facility] 06/01/2015, 5:40 PM

## 2015-06-01 NOTE — Patient Instructions (Signed)
Great to see you!  Take all antibiotics  Cellulitis Cellulitis is an infection of the skin and the tissue beneath it. The infected area is usually red and tender. Cellulitis occurs most often in the arms and lower legs.  CAUSES  Cellulitis is caused by bacteria that enter the skin through cracks or cuts in the skin. The most common types of bacteria that cause cellulitis are staphylococci and streptococci. SIGNS AND SYMPTOMS   Redness and warmth.  Swelling.  Tenderness or pain.  Fever. DIAGNOSIS  Your health care provider can usually determine what is wrong based on a physical exam. Blood tests may also be done. TREATMENT  Treatment usually involves taking an antibiotic medicine. HOME CARE INSTRUCTIONS   Take your antibiotic medicine as directed by your health care provider. Finish the antibiotic even if you start to feel better.  Keep the infected arm or leg elevated to reduce swelling.  Apply a warm cloth to the affected area up to 4 times per day to relieve pain.  Take medicines only as directed by your health care provider.  Keep all follow-up visits as directed by your health care provider. SEEK MEDICAL CARE IF:   You notice red streaks coming from the infected area.  Your red area gets larger or turns dark in color.  Your bone or joint underneath the infected area becomes painful after the skin has healed.  Your infection returns in the same area or another area.  You notice a swollen bump in the infected area.  You develop new symptoms.  You have a fever. SEEK IMMEDIATE MEDICAL CARE IF:   You feel very sleepy.  You develop vomiting or diarrhea.  You have a general ill feeling (malaise) with muscle aches and pains.   This information is not intended to replace advice given to you by your health care provider. Make sure you discuss any questions you have with your health care provider.   Document Released: 12/27/2004 Document Revised: 12/08/2014 Document  Reviewed: 06/04/2011 Elsevier Interactive Patient Education [employer]

## 2015-06-06 ENCOUNTER — Ambulatory Visit (INDEPENDENT_AMBULATORY_CARE_PROVIDER_SITE_OTHER): Payer: Medicare Other | Admitting: *Deleted

## 2015-06-06 DIAGNOSIS — E538 Deficiency of other specified B group vitamins: Secondary | ICD-10-CM

## 2015-06-06 NOTE — Progress Notes (Signed)
Pt given B12 injection IM left deltoid and tolerated well. °

## 2015-06-14 ENCOUNTER — Encounter: Payer: Self-pay | Admitting: Pediatrics

## 2015-06-14 ENCOUNTER — Ambulatory Visit (INDEPENDENT_AMBULATORY_CARE_PROVIDER_SITE_OTHER): Payer: Medicare Other | Admitting: Pediatrics

## 2015-06-14 VITALS — BP 143/65 | HR 51 | Temp 97.4°F | Ht 64.0 in | Wt 232.8 lb

## 2015-06-14 DIAGNOSIS — M7989 Other specified soft tissue disorders: Secondary | ICD-10-CM | POA: Diagnosis not present

## 2015-06-14 DIAGNOSIS — M546 Pain in thoracic spine: Secondary | ICD-10-CM | POA: Diagnosis not present

## 2015-06-14 DIAGNOSIS — J309 Allergic rhinitis, unspecified: Secondary | ICD-10-CM | POA: Diagnosis not present

## 2015-06-14 DIAGNOSIS — I10 Essential (primary) hypertension: Secondary | ICD-10-CM

## 2015-06-14 LAB — URINALYSIS
Bilirubin, UA: NEGATIVE
GLUCOSE, UA: NEGATIVE
KETONES UA: NEGATIVE
LEUKOCYTES UA: NEGATIVE
Nitrite, UA: NEGATIVE
PH UA: 7 (ref 5.0–7.5)
PROTEIN UA: NEGATIVE
RBC UA: NEGATIVE
SPEC GRAV UA: 1.02 (ref 1.005–1.030)
Urobilinogen, Ur: 0.2 mg/dL (ref 0.2–1.0)

## 2015-06-14 NOTE — Progress Notes (Signed)
Subjective:    Patient ID: [name], female    DOB: Apr 19, 1932, 82 y.o.   MRN: [id number]  CC: Back Pain and Abdominal Pain   HPI: [name] is a 82 y.o. female presenting for Back Pain and Abdominal Pain  Turns red when she bends over Has some sinus congestion has been off and on for past 6 months Seen by ENT, has been on a couple of different antibiotics, took one for 3 weeks a couple of months ago Not on nasal steroid Takes allegra qod No fevers Normal appetite Woke up hungry today Has mid-thoracic back pain that goes around to the front of her abdomen   Back has been hurting for past week, now sides/groin hurts Has had same pain before Comes and goes Has been told that she has bone spurs on her back Has been working in th [place] more recently     Depression screen [facility] 2/9 06/14/2015 04/21/2015 02/25/2015 02/04/2015 01/26/2015  Decreased Interest 0 0 0 0 1  Down, Depressed, Hopeless PHQ - 2 Score Altered sleeping - - - - 3  Tired, decreased energy - - - - 2  Change in appetite - - - - 2  Feeling bad or failure about yourself  - - - - 1  Trouble concentrating - - - - 0  Moving slowly or fidgety/restless - - - - 0  Suicidal thoughts - - - - 0  PHQ-9 Score - - - - 10     Relevant past medical, surgical, family and social history reviewed and updated as indicated. Interim medical history since our last visit reviewed. Allergies and medications reviewed and updated.    ROS: Per HPI unless specifically indicated above  History  Smoking status  . Never Smoker   Smokeless tobacco  . Never Used    Past Medical History Patient Active Problem List   Diagnosis Date Noted  . Hearing loss 01/06/2015  . Thoracic aorta atherosclerosis ([facility]) 03/04/2014  . Degenerative arthritis of thoracic spine 03/04/2014  . Osteoporosis, post-menopausal 08/12/2013  . Metabolic syndrome 06/23/2013  . Anemia, iron deficiency 03/16/2013  . Vitamin D  deficiency 03/16/2013  . HTN (hypertension) 03/16/2013  . Hypothyroidism   . Urinary tract infection, site not specified   . Diaphragmatic hernia without mention of obstruction or gangrene   . Esophagitis   . Hyperlipidemia   . Prolapse of vaginal walls without mention of uterine prolapse   . Arthritis   . First degree atrioventricular block   . Symptomatic menopausal or female climacteric states   . AF (atrial fibrillation) ([facility])   . Rectal polyp   . Gastric polyp         Objective:    BP 143/65 mmHg  Pulse 51  Temp(Src) 97.4 F (36.3 C) (Oral)  Ht  (1.626 m)  Wt 232 lb 12.8 oz (105.597 kg)  BMI 39.94 kg/m2  Wt Readings from Last 3 Encounters:  06/14/15 232 lb 12.8 oz (105.597 kg)  06/01/15 229 lb 6.4 oz (104.055 kg)  04/21/15 226 lb (102.513 kg)   BP recheck 143/65  Gen: NAD, alert, cooperative with exam, NCAT EYES: EOMI, no scleral injection or icterus ENT:  TMs pearly gray b/l, OP without erythema LYMPH: no cervical LAD CV: NRRR, normal S1/S2, no murmur, distal pulses 2+ b/l Resp: CTABL, no wheezes, normal WOB Abd: +BS, soft, NTND. no guarding or organomegaly, no CVA tenderness  Ext: 1+ pitting edema, warm Neuro: Alert and oriented, strength equal b/l LE, coordination grossly normal  MSK: mildly tender paraspinous muscles b/l     Assessment & Plan:    [name] was seen today for back pain and abdominal pain.  Diagnoses and all orders for this visit:  Allergic rhinitis, unspecified allergic rhinitis type Discussed flonase start, symptomatic care  Bilateral thoracic back pain Likely musculoskeletal, will check UA. -     Urinalysis  Essential hypertension Improved with recheck, continue current meds   Follow up plan: Return in about 2 weeks (around 06/28/2015) for BP recheck.  Rex Krasarol Ura Yingling, MD [facility] 06/14/2015

## 2015-06-14 NOTE — Patient Instructions (Signed)
Flonase nasal steroid two sprays each side once a day

## 2015-06-30 ENCOUNTER — Ambulatory Visit (INDEPENDENT_AMBULATORY_CARE_PROVIDER_SITE_OTHER): Payer: Medicare Other | Admitting: Pediatrics

## 2015-06-30 ENCOUNTER — Encounter: Payer: Self-pay | Admitting: Pediatrics

## 2015-06-30 VITALS — BP 147/64 | HR 54 | Temp 96.7°F | Ht 64.0 in | Wt 226.2 lb

## 2015-06-30 DIAGNOSIS — I10 Essential (primary) hypertension: Secondary | ICD-10-CM | POA: Diagnosis not present

## 2015-06-30 DIAGNOSIS — I480 Paroxysmal atrial fibrillation: Secondary | ICD-10-CM

## 2015-06-30 DIAGNOSIS — Z638 Other specified problems related to primary support group: Secondary | ICD-10-CM | POA: Diagnosis not present

## 2015-06-30 DIAGNOSIS — M7989 Other specified soft tissue disorders: Secondary | ICD-10-CM

## 2015-06-30 DIAGNOSIS — F439 Reaction to severe stress, unspecified: Secondary | ICD-10-CM

## 2015-06-30 MED ORDER — FUROSEMIDE 40 MG PO TABS
40.0000 mg | ORAL_TABLET | Freq: Every day | ORAL | Status: DC
Start: 2015-06-30 — End: 2015-07-12

## 2015-06-30 MED ORDER — LISINOPRIL 40 MG PO TABS: 40.0000 mg | ORAL_TABLET | Freq: Every day | ORAL | Status: DC

## 2015-06-30 NOTE — Progress Notes (Signed)
    Subjective:    Patient ID: [name], female    DOB: [birth year], 82 y.o.   MRN: [id number]  CC: Blood Pressure Check   HPI: [name] DakinBessie Annas is a 82 y.o. female presenting for Blood Pressure Check  HTN: taking meds regularly, doesn't   Some headache two days Lots of stress at home, son suddenly can;t walk Having some heart problems  Some leg swelling ongoing, no worse than usual per pt No SOB, no trouble breathing Has been on her feet a lot with son's health problems  Afib: no palpitations. Not interestd   Relevant past medical, surgical, family and social history reviewed and updated as indicated. Interim medical history since our last visit reviewed. Allergies and medications reviewed and updated.    ROS: Per HPI unless specifically indicated above  History  Smoking status  . Never Smoker   Smokeless tobacco  . Never Used        Objective:    BP 147/64 mmHg  Pulse 54  Temp(Src) 96.7 F (35.9 C) (Oral)  Ht 5\' 4"  (1.626 m)  Wt 226 lb 3.2 oz (102.604 kg)  BMI 38.81 kg/m2  Wt Readings from Last 3 Encounters:  06/30/15 226 lb 3.2 oz (102.604 kg)  06/14/15 232 lb 12.8 oz (105.597 kg)  06/01/15 229 lb 6.4 oz (104.055 kg)    Gen: NAD, alert, cooperative with exam, NCAT EYES: EOMI, no scleral injection or icterus ENT:   OP without erythema LYMPH: no cervical LAD CV: NRRR, normal S1/S2, no murmur, distal pulses 2+ b/l Resp: CTABL, no wheezes, normal WOB Abd: +BS, soft, NTND. Ext: 1+ pitting edema, warm Neuro: Alert and oriented, strength equal b/l UE and LE, coordination grossly normal MSK: normal muscle bulk     Assessment & Plan:    [name] was seen today for blood pressure check, med follow up.  Diagnoses and all orders for this visit:  Paroxysmal atrial fibrillation [facility](HCC) Not interested in anticoagulation, aware of risk of stroke. Per pt and chart review has discussed with cardiology multiple times. CHA2DS2 - VASc score of 4.   Essential  hypertension Stable, continue current meds. Also on K supplement. Needs labs last visit, recently checked normal K. -     furosemide (LASIX) 40 MG tablet; Take 1 tablet (40 mg total) by mouth daily. -     lisinopril (PRINIVIL,ZESTRIL) 40 MG tablet; Take 1 tablet (40 mg total) by mouth daily.  Leg swelling No worse than usual, lung exam normal On her feet more recently with son's illness. Continue lasix, compression hose, prop up feet throughout the day Let us know if worsening.  Stress at home Helping care for son with possible myositis, limited mobility Encouraged her to ask for help as needed Pt says she is doing ok right now   Follow up plan: Return in about 3 months (around 09/30/2015). Needs labs next visit  Rex Krasarol Atziri Zubiate, MD [facility] 06/30/2015, 10:18 AM

## 2015-07-08 ENCOUNTER — Ambulatory Visit (INDEPENDENT_AMBULATORY_CARE_PROVIDER_SITE_OTHER): Payer: Medicare Other | Admitting: *Deleted

## 2015-07-08 DIAGNOSIS — E538 Deficiency of other specified B group vitamins: Secondary | ICD-10-CM

## 2015-07-08 NOTE — Patient Instructions (Signed)

## 2015-07-08 NOTE — Progress Notes (Signed)
Vitamin b12 injection given and patient tolerated well.  

## 2015-07-12 ENCOUNTER — Telehealth: Payer: Self-pay | Admitting: Family Medicine

## 2015-07-12 DIAGNOSIS — I10 Essential (primary) hypertension: Secondary | ICD-10-CM

## 2015-07-12 MED ORDER — LISINOPRIL 40 MG PO TABS: 40.0000 mg | ORAL_TABLET | Freq: Every day | ORAL | Status: DC

## 2015-07-12 MED ORDER — FUROSEMIDE 40 MG PO TABS: 40.0000 mg | ORAL_TABLET | Freq: Every day | ORAL | Status: DC

## 2015-07-12 NOTE — Telephone Encounter (Signed)
Prescriptions for furosemide & lisinopril were sent to [facility] pharmacy on 06/30/15.  I contacted them and they no longer service [facility], this has been taken over by [facility] mail order.  I was given a number, [phone], to contact [facility].  [facility] who verified they are the correct pharmacy but that they have not received these prescriptions for this patient.  I e-scribed Lisinopril & Lasix to [facility] mail order and the patient was informed.

## 2015-07-18 ENCOUNTER — Telehealth: Payer: Self-pay | Admitting: Family Medicine

## 2015-07-18 NOTE — Telephone Encounter (Signed)
Spoke to pharmacy and advised pt has been on furosemide since May of 2014.

## 2015-07-20 ENCOUNTER — Ambulatory Visit: Payer: Self-pay | Admitting: Pulmonary Disease

## 2015-07-20 ENCOUNTER — Other Ambulatory Visit: Payer: Self-pay | Admitting: Family Medicine

## 2015-07-20 NOTE — Telephone Encounter (Signed)
Last seen 06/30/15  Dr Oswaldo DoneVincent  Dr Christell ConstantMoore PCP

## 2015-08-01 ENCOUNTER — Ambulatory Visit: Payer: Self-pay | Admitting: Pulmonary Disease

## 2015-08-03 ENCOUNTER — Ambulatory Visit (INDEPENDENT_AMBULATORY_CARE_PROVIDER_SITE_OTHER): Payer: Medicare Other | Admitting: Pulmonary Disease

## 2015-08-03 ENCOUNTER — Encounter: Payer: Self-pay | Admitting: Pulmonary Disease

## 2015-08-03 VITALS — BP 136/64 | HR 52 | Ht 64.0 in | Wt 230.0 lb

## 2015-08-03 DIAGNOSIS — R0600 Dyspnea, unspecified: Secondary | ICD-10-CM

## 2015-08-03 DIAGNOSIS — R0689 Other abnormalities of breathing: Secondary | ICD-10-CM | POA: Diagnosis not present

## 2015-08-03 MED ORDER — BECLOMETHASONE DIPROPIONATE 40 MCG/ACT IN AERS: 2.0000 | INHALATION_SPRAY | Freq: Two times a day (BID) | RESPIRATORY_TRACT | Status: DC

## 2015-08-03 MED ORDER — PANTOPRAZOLE SODIUM 40 MG PO TBEC: 40.0000 mg | DELAYED_RELEASE_TABLET | Freq: Every day | ORAL | Status: DC

## 2015-08-03 MED ORDER — PANTOPRAZOLE SODIUM 20 MG PO TBEC: 20.0000 mg | DELAYED_RELEASE_TABLET | Freq: Every day | ORAL | Status: DC

## 2015-08-03 NOTE — Addendum Note (Signed)
Addended by: Boone MasterJONES, JESSICA E on: 08/03/2015 05:13 PM   Modules accepted: Orders

## 2015-08-03 NOTE — Addendum Note (Signed)
Addended by: Boone MasterJONES, JESSICA E on: 08/03/2015 05:17 PM   Modules accepted: Orders

## 2015-08-03 NOTE — Progress Notes (Signed)
Subjective:    Patient ID: [name], female    DOB: [birth year], 82 y.o.   MRN: [id number]  HPI  Follow up for dyspnea.  [name] is a 82 year old with history as below. She is referred by Dr. Antoine Poche for increasing dyspnea. She describes allergy type symptoms that's related to exposures in her apartment. She reports having new smells causing increasing dyspnea, right-sided pleuritic chest pain, wheezing. She denies any cough, sputum production, fevers, chills, hemoptysis.  She is lifelong nonsmoker. She does not have any relevant exposures at work.  DATA: PFTs 04/13/15 FVC 1.50 (57%) FEV1 1.07 (54%) F/F 71 TLC 66% DLCO 50% Mild obstructive airway disease, severe restriction, moderate diffusion defect which corrects for alveolar volume.  Past Medical History  Diagnosis Date  . Hypothyroid   . Diaphragmatic hernia without mention of obstruction or gangrene   . Esophagitis   . Hyperlipidemia   . Prolapse of vaginal walls without mention of uterine prolapse   . Arthritis   . First degree atrioventricular block   . AF (atrial fibrillation) (HCC)   . Rectal polyp   . Gastric polyp   . Hypertension     Current outpatient prescriptions:  .  albuterol (PROVENTIL HFA;VENTOLIN HFA) 108 (90 Base) MCG/ACT inhaler, Inhale 2 puffs into the lungs every 4 (four) hours as needed for wheezing or shortness of breath., Disp: 8 g, Rfl: 5 .  calcium-vitamin D (OSCAL WITH D) 500-200 MG-UNIT per tablet, Take 1 tablet by mouth daily., Disp: , Rfl:  .  Cholecalciferol (VITAMIN D3) 2000 UNITS TABS, Take 1 tablet by mouth daily.  , Disp: , Rfl:  .  ferrous fumarate-iron polysaccharide complex (TANDEM) 162-115.2 MG CAPS, Take 1 capsule by mouth daily with breakfast., Disp: , Rfl:  .  furosemide (LASIX) 40 MG tablet, Take 1 tablet (40 mg total) by mouth daily., Disp: 90 tablet, Rfl: 2 .  hydrocortisone (PROCTOCORT) 1 % CREA, Apply to area BID PRN, Disp: 28.4 g, Rfl: 1 .  KLOR-CON 10 10 MEQ tablet,  Take 1 tablet (10 mEq total) by mouth daily. Dispense Klor Con only, Disp: 90 tablet, Rfl: 3 .  levothyroxine (SYNTHROID, LEVOTHROID) 137 MCG tablet, Take 1 tablet (137 mcg total) by mouth daily. As directed, Disp: 90 tablet, Rfl: 3 .  lisinopril (PRINIVIL,ZESTRIL) 40 MG tablet, Take 1 tablet (40 mg total) by mouth daily., Disp: 90 tablet, Rfl: 1 .  LORazepam (ATIVAN) 0.5 MG tablet, One half tablet to one daily if needed for anxiety, Disp: 30 tablet, Rfl: 1 .  Multiple Vitamins-Minerals (CENTRUM SILVER) tablet, Take 1 tablet by mouth daily. , Disp: , Rfl:  .  tiZANidine (ZANAFLEX) 2 MG tablet, Take 1 tablet (2 mg total) by mouth every 8 (eight) hours as needed for muscle spasms., Disp: 20 tablet, Rfl: 0  Current facility-administered medications:  .  cyanocobalamin ((VITAMIN B-12)) injection 1,000 mcg, 1,000 mcg, Intramuscular, Q30 days, Ernestina Penna, MD, 1,000 mcg at 07/08/15 1024  Review of Systems Dyspnea, wheezing Denies any chest pain, palpitations,  No fevers, chills, nausea, vomiting, diarrhea, constipation. All other review of systems are negative.   Objective:   Physical Exam Blood pressure 152/84, pulse 65, temperature 97.7 F (36.5 C), temperature source Oral, height  (1.626 m), weight 232 lb 12.8 oz (105.597 kg), SpO2 97 %.  Gen: No apparent distress Neuro: No gross focal deficits. Neck: No JVD, lymphadenopathy, thyromegaly. RS: Clear, No wheeze or crackles. CVS: S1-S2 heard, no murmurs rubs gallops. Abdomen: Soft, positive  bowel sounds. Extremities: No edema.    Assessment & Plan:  Dyspnea, wheezing. Patient has acute onset of dyspnea, sensitivity to fumes exposure. She may be developing reactive airway disease, asthma but it is unusual for her to develop these symptoms at such an advanced age. We started her on albuterol inhaler at last visit but she is not taking this on a regular basis. She may benefit from being on a long-term controller medication such as Qvar.  She also has a hiatal hernia and acid reflux. I will start her on Protonix 20 mg daily for this.  Her PFTs were reviewed with her today. They do not show any bronchodilator response. There is some restriction which may be secondary to her obesity, hiatal hernia. There is no evidence of ILD on previous lung imaging. She has reduction in diffusion capacity which corrects for alveolar volume.  She is advised to avoid exposures at home. She may need a methacholine challenge test, skin testing for allergies in the future.  Plan:  - Start qvar inahler - Continue albuterol rescue inhaler  - Protonix 20 mg qd for GERD  Chilton GreathousePraveen Yerick Eggebrecht MD  Pulmonary and Critical Care Pager [phone] If no answer or after 3pm call: [phone] 08/03/2015, 12:05 PM

## 2015-08-03 NOTE — Patient Instructions (Signed)
Your PFTs were reviewed with you today. We will start you on a Qvar inhaler and Protonix 20 mg daily.  Return to clinic in 3 months.

## 2015-08-03 NOTE — Addendum Note (Signed)
Addended by: Boone MasterJONES, Harlyn Rathmann E on: 08/03/2015 05:36 PM   Modules accepted: Orders

## 2015-08-08 ENCOUNTER — Ambulatory Visit: Payer: Medicare Other

## 2015-08-09 ENCOUNTER — Ambulatory Visit (INDEPENDENT_AMBULATORY_CARE_PROVIDER_SITE_OTHER): Payer: Medicare Other | Admitting: *Deleted

## 2015-08-09 DIAGNOSIS — E538 Deficiency of other specified B group vitamins: Secondary | ICD-10-CM

## 2015-08-09 NOTE — Patient Instructions (Signed)

## 2015-08-09 NOTE — Progress Notes (Signed)
Vitamin b12 injection given and patient tolerated well.  

## 2015-09-01 ENCOUNTER — Ambulatory Visit: Payer: Medicare Other | Admitting: Family Medicine

## 2015-09-09 ENCOUNTER — Ambulatory Visit (INDEPENDENT_AMBULATORY_CARE_PROVIDER_SITE_OTHER): Payer: Medicare Other | Admitting: *Deleted

## 2015-09-09 DIAGNOSIS — E538 Deficiency of other specified B group vitamins: Secondary | ICD-10-CM | POA: Diagnosis not present

## 2015-09-09 NOTE — Patient Instructions (Signed)

## 2015-09-09 NOTE — Progress Notes (Signed)
Vitamin b12 injection given and patient tolerated well.  

## 2015-09-13 ENCOUNTER — Telehealth: Payer: Self-pay | Admitting: Family Medicine

## 2015-09-14 ENCOUNTER — Encounter: Payer: Self-pay | Admitting: Family Medicine

## 2015-09-14 ENCOUNTER — Ambulatory Visit (INDEPENDENT_AMBULATORY_CARE_PROVIDER_SITE_OTHER): Payer: Medicare Other | Admitting: Family Medicine

## 2015-09-14 VITALS — BP 141/67 | HR 57 | Temp 98.0°F | Ht 64.0 in | Wt 228.0 lb

## 2015-09-14 DIAGNOSIS — I10 Essential (primary) hypertension: Secondary | ICD-10-CM

## 2015-09-14 DIAGNOSIS — E559 Vitamin D deficiency, unspecified: Secondary | ICD-10-CM

## 2015-09-14 DIAGNOSIS — I480 Paroxysmal atrial fibrillation: Secondary | ICD-10-CM | POA: Diagnosis not present

## 2015-09-14 DIAGNOSIS — E785 Hyperlipidemia, unspecified: Secondary | ICD-10-CM | POA: Diagnosis not present

## 2015-09-14 DIAGNOSIS — E034 Atrophy of thyroid (acquired): Secondary | ICD-10-CM

## 2015-09-14 DIAGNOSIS — E038 Other specified hypothyroidism: Secondary | ICD-10-CM | POA: Diagnosis not present

## 2015-09-14 DIAGNOSIS — I700 Atherosclerosis of aorta: Secondary | ICD-10-CM

## 2015-09-14 MED ORDER — KLOR-CON 10 10 MEQ PO TBCR: 10.0000 meq | EXTENDED_RELEASE_TABLET | Freq: Every day | ORAL | Status: DC

## 2015-09-14 NOTE — Progress Notes (Signed)
Subjective:    Patient ID: [name], female    DOB: [birth year], 82 y.o.   MRN: [id number]  HPI  Pt here for follow up and management of chronic medical problems which includes hypothyroid, hyperlipidemia, and hypertension. She is taking medications regularly.The patient is doing well overall. She has no specific complaints. She's been seeing the pulmonologist because of her's breathing issues. She is due to get a DEXA scan but prefers not to do it today. She is due also to get a mammogram and we'll schedule this. We'll be giving her an FOBT to return and she will get lab work today. She complains of feeling some dizziness since starting the higher dose of lisinopril at 40 mg. The patient indicates that she has not been using her inhaler regularly that it seemed to cause some problems and she stopped using it. [name] it made her heart race [name]. She also only took the proton X4 to 3 doses and then stop that. She's been taking the extra lisinopril for maybe 38 weeks or so. She does not have any chest pain or shortness of breath anymore than usual. She does have some occasional heartburn but is not taking the proton X. She denies any blood in the stool or black tarry bowel movements. She says she is passing her water without problems. She does have some drainage and thinks that this could be playing a little bit of more with her nausea and some of her dizziness. She is not using any saline or anything for the congestion or drainage and she will start doing this again. She is also due to get her mammogram. We will check her breasts today since this exam is due.    Patient Active Problem List   Diagnosis Date Noted  . Hearing loss 01/06/2015  . Thoracic aorta atherosclerosis ([place]) 03/04/2014  . Degenerative arthritis of thoracic spine 03/04/2014  . Osteoporosis, post-menopausal 08/12/2013  . Metabolic syndrome 41/66/0630  . Anemia, iron deficiency 03/16/2013  . Vitamin D deficiency 03/16/2013  .  HTN (hypertension) 03/16/2013  . Hypothyroidism   . Diaphragmatic hernia without mention of obstruction or gangrene   . Esophagitis   . Hyperlipidemia   . Prolapse of vaginal walls without mention of uterine prolapse   . Arthritis   . First degree atrioventricular block   . Symptomatic menopausal or female climacteric states   . AF (atrial fibrillation) ([place])   . Rectal polyp   . Gastric polyp    Outpatient Encounter Prescriptions as of 09/14/2015  Medication Sig  . albuterol (PROVENTIL HFA;VENTOLIN HFA) 108 (90 Base) MCG/ACT inhaler Inhale 2 puffs into the lungs every 4 (four) hours as needed for wheezing or shortness of breath.  . beclomethasone (QVAR) 40 MCG/ACT inhaler Inhale 2 puffs into the lungs 2 (two) times daily.  . calcium-vitamin D (OSCAL WITH D) 500-200 MG-UNIT per tablet Take 1 tablet by mouth daily.  . Cholecalciferol (VITAMIN D3) 2000 UNITS TABS Take 1 tablet by mouth daily.    . ferrous fumarate-iron polysaccharide complex (TANDEM) 162-115.2 MG CAPS Take 1 capsule by mouth daily with breakfast.  . furosemide (LASIX) 40 MG tablet Take 1 tablet (40 mg total) by mouth daily.  . hydrocortisone (PROCTOCORT) 1 % CREA Apply to area BID PRN  . KLOR-CON 10 10 MEQ tablet Take 1 tablet (10 mEq total) by mouth daily. Dispense Klor Con only  . levothyroxine (SYNTHROID, LEVOTHROID) 137 MCG tablet Take 1 tablet (137 mcg total) by mouth daily. As directed  .  lisinopril (PRINIVIL,ZESTRIL) 40 MG tablet Take 1 tablet (40 mg total) by mouth daily.  Marland Kitchen LORazepam (ATIVAN) 0.5 MG tablet One half tablet to one daily if needed for anxiety  . Multiple Vitamins-Minerals (CENTRUM SILVER) tablet Take 1 tablet by mouth daily.   . pantoprazole (PROTONIX) 20 MG tablet Take 1 tablet (20 mg total) by mouth daily before breakfast.  . tiZANidine (ZANAFLEX) 2 MG tablet Take 1 tablet (2 mg total) by mouth every 8 (eight) hours as needed for muscle spasms.  . [DISCONTINUED] KLOR-CON 10 10 MEQ tablet Take 1  tablet (10 mEq total) by mouth daily. Dispense Klor Con only   Facility-Administered Encounter Medications as of 09/14/2015  Medication  . cyanocobalamin ((VITAMIN B-12)) injection 1,000 mcg      Review of Systems  Constitutional: Negative.   HENT: Negative.   Eyes: Negative.   Respiratory: Negative.   Cardiovascular: Negative.   Gastrointestinal: Negative.   Endocrine: Negative.   Genitourinary: Negative.   Musculoskeletal: Negative.   Skin: Negative.   Allergic/Immunologic: Negative.   Neurological: Negative.   Hematological: Negative.   Psychiatric/Behavioral: Negative.        Objective:   Physical Exam  Constitutional: She is oriented to person, place, and time. She appears well-developed and well-nourished. No distress.  HENT:  Head: Normocephalic and atraumatic.  Right Ear: External ear normal.  Left Ear: External ear normal.  Mouth/Throat: Oropharynx is clear and moist. No oropharyngeal exudate.  Nasal pallor and turbinate congestion bilaterally  Eyes: Conjunctivae and EOM are normal. Pupils are equal, round, and reactive to light. Right eye exhibits no discharge. Left eye exhibits no discharge. No scleral icterus.  Neck: Normal range of motion. Neck supple. No thyromegaly present.  No bruits or thyromegaly or anterior cervical adenopathy  Cardiovascular: Normal rate, regular rhythm, normal heart sounds and intact distal pulses.   No murmur heard. The heart is 72/m and irregular   Pulmonary/Chest: Effort normal and breath sounds normal. No respiratory distress. She has no wheezes. She has no rales. She exhibits no tenderness.  Clear anteriorly and posteriorly  Abdominal: Soft. Bowel sounds are normal. She exhibits no mass. There is no tenderness. There is no rebound and no guarding.  The abdomen is obese without masses or tenderness  Genitourinary:  Breasts were examined and there were no lumps or masses or axillary adenopathy  Musculoskeletal: Normal range of  motion. She exhibits edema. She exhibits no tenderness.  Lymphadenopathy:    She has no cervical adenopathy.  Neurological: She is alert and oriented to person, place, and time. She has normal reflexes. No cranial nerve deficit.  Skin: Skin is warm and dry. No rash noted.  Psychiatric: She has a normal mood and affect. Her behavior is normal. Judgment and thought content normal.  Nursing note and vitals reviewed.  BP 141/67 mmHg  Pulse 57  Temp(Src) 98 F (36.7 C) (Oral)  Ht '5\' 4"'$  (1.626 m)  Wt 228 lb (103.42 kg)  BMI 39.12 kg/m2        Assessment & Plan:  1. Essential hypertension -The blood pressure is minimally elevated today and we will continue with current treatment - BMP8+EGFR - CBC with Differential/Platelet - Hepatic function panel  2. Paroxysmal atrial fibrillation (HCC) -The patient's heart rhythm is irregular today and she will continue to follow-up with the cardiologist. - CBC with Differential/Platelet  3. Thoracic aorta atherosclerosis ([place]) -Continue with aggressive therapeutic lifestyle changes which include diet and exercise - CBC with Differential/Platelet  4. Hypothyroidism due  to acquired atrophy of thyroid -Continue with current treatment pending results of lab work - CBC with Differential/Platelet - Thyroid Panel With TSH  5. Vitamin D deficiency -Continue with current treatment pending results of lab work - CBC with Differential/Platelet - VITAMIN D 25 Hydroxy (Vit-D Deficiency, Fractures)  6. Hyperlipidemia -Continue with aggressive therapeutic lifestyle changes - CBC with Differential/Platelet - Lipid panel  Meds ordered this encounter  Medications  . KLOR-CON 10 10 MEQ tablet    Sig: Take 1 tablet (10 mEq total) by mouth daily. Dispense Klor Con only    Dispense:  90 tablet    Refill:  3   Patient Instructions                       Medicare Annual Wellness Visit  [place] and the medical providers at [facility] strive to bring you the best medical care.  In doing so we not only want to address your current medical conditions and concerns but also to detect new conditions early and prevent illness, disease and health-related problems.    Medicare offers a yearly Wellness Visit which allows our clinical staff to assess your need for preventative services including immunizations, lifestyle education, counseling to decrease risk of preventable diseases and screening for fall risk and other medical concerns.    This visit is provided free of charge (no copay) for all Medicare recipients. The clinical pharmacists at [facility] have begun to conduct these Wellness Visits which will also include a thorough review of all your medications.    As you primary medical provider recommend that you make an appointment for your Annual Wellness Visit if you have not done so already this year.  You may set up this appointment before you leave today or you may call back ([phone]) and schedule an appointment.  Please make sure when you call that you mention that you are scheduling your Annual Wellness Visit with the clinical pharmacist so that the appointment may be made for the proper length of time.     Continue current medications. Continue good therapeutic lifestyle changes which include good diet and exercise. Fall precautions discussed with patient. If an FOBT was given today- please return it to our front desk. If you are over 51 years old - you may need Prevnar 20 or the adult Pneumonia vaccine.  **Flu shots are available--- please call and schedule a FLU-CLINIC appointment**  After your visit with [place] today you will receive a survey in the mail or online from [employer] regarding your care with [place]. Please take a moment to fill this out. Your feedback is very important to [place] as you can help [place] better understand your patient needs as well as improve your experience and satisfaction. WE  CARE ABOUT YOU!!!   The patient should restart her pantoprazole and take it more regularly prior to breakfast every morning She should follow-up with the cardiologist as planned She should put her support hose on upon first arising from the bed in the morning She should continue with the lisinopril 40 mg and if possible and get some outside blood pressure readings She should not forget to get her eye exam She should return to the office and get her mammogram when it is scheduled   Arrie Senate MD

## 2015-09-14 NOTE — Patient Instructions (Addendum)
Medicare Annual Wellness Visit  [place] and the medical providers at [facility] strive to bring you the best medical care.  In doing so we not only want to address your current medical conditions and concerns but also to detect new conditions early and prevent illness, disease and health-related problems.    Medicare offers a yearly Wellness Visit which allows our clinical staff to assess your need for preventative services including immunizations, lifestyle education, counseling to decrease risk of preventable diseases and screening for fall risk and other medical concerns.    This visit is provided free of charge (no copay) for all Medicare recipients. The clinical pharmacists at [facility] have begun to conduct these Wellness Visits which will also include a thorough review of all your medications.    As you primary medical provider recommend that you make an appointment for your Annual Wellness Visit if you have not done so already this year.  You may set up this appointment before you leave today or you may call back [phone] and schedule an appointment.  Please make sure when you call that you mention that you are scheduling your Annual Wellness Visit with the clinical pharmacist so that the appointment may be made for the proper length of time.     Continue current medications. Continue good therapeutic lifestyle changes which include good diet and exercise. Fall precautions discussed with patient. If an FOBT was given today- please return it to our front desk. If you are over 80 years old - you may need Prevnar 13 or the adult Pneumonia vaccine.  **Flu shots are available--- please call and schedule a FLU-CLINIC appointment**  After your visit with us today you will receive a survey in the mail or online from [employer] regarding your care with us. Please take a moment to fill this out. Your feedback is very  important to us as you can help us better understand your patient needs as well as improve your experience and satisfaction. WE CARE ABOUT YOU!!!   The patient should restart her pantoprazole and take it more regularly prior to breakfast every morning She should follow-up with the cardiologist as planned She should put her support hose on upon first arising from the bed in the morning She should continue with the lisinopril 40 mg and if possible and get some outside blood pressure readings She should not forget to get her eye exam She should return to the office and get her mammogram when it is scheduled

## 2015-09-15 ENCOUNTER — Telehealth: Payer: Self-pay | Admitting: *Deleted

## 2015-09-15 ENCOUNTER — Ambulatory Visit (INDEPENDENT_AMBULATORY_CARE_PROVIDER_SITE_OTHER): Payer: No Typology Code available for payment source | Admitting: Psychology

## 2015-09-15 DIAGNOSIS — T7840XD Allergy, unspecified, subsequent encounter: Secondary | ICD-10-CM

## 2015-09-15 DIAGNOSIS — F331 Major depressive disorder, recurrent, moderate: Secondary | ICD-10-CM

## 2015-09-15 LAB — BMP8+EGFR
BUN / CREAT RATIO: 20 (ref 12–28)
BUN: 24 mg/dL (ref 8–27)
CALCIUM: 10.1 mg/dL (ref 8.7–10.3)
CO2: 29 mmol/L (ref 18–29)
CREATININE: 1.18 mg/dL — AB (ref 0.57–1.00)
Chloride: 96 mmol/L (ref 96–106)
GFR, EST AFRICAN AMERICAN: 50 mL/min/{1.73_m2} — AB (ref >59)
GFR, EST NON AFRICAN AMERICAN: 43 mL/min/{1.73_m2} — AB (ref >59)
Glucose: 92 mg/dL (ref 65–99)
Potassium: 4.6 mmol/L (ref 3.5–5.2)
Sodium: 142 mmol/L (ref 134–144)

## 2015-09-15 LAB — CBC WITH DIFFERENTIAL/PLATELET
BASOS ABS: 0 10*3/uL (ref 0.0–0.2)
BASOS: 0 %
EOS (ABSOLUTE): 0.4 10*3/uL (ref 0.0–0.4)
EOS: 5 %
HEMATOCRIT: 33.9 % — AB (ref 34.0–46.6)
HEMOGLOBIN: 11.3 g/dL (ref 11.1–15.9)
IMMATURE GRANS (ABS): 0 10*3/uL (ref 0.0–0.1)
Immature Granulocytes: 0 %
LYMPHS ABS: 1.5 10*3/uL (ref 0.7–3.1)
LYMPHS: 19 %
MCH: 30 pg (ref 26.6–33.0)
MCHC: 33.3 g/dL (ref 31.5–35.7)
MCV: 90 fL (ref 79–97)
MONOCYTES: 8 %
Monocytes Absolute: 0.6 10*3/uL (ref 0.1–0.9)
NEUTROS ABS: 5.1 10*3/uL (ref 1.4–7.0)
Neutrophils: 68 %
Platelets: 217 10*3/uL (ref 150–379)
RBC: 3.77 x10E6/uL (ref 3.77–5.28)
RDW: 14.1 % (ref 12.3–15.4)
WBC: 7.6 10*3/uL (ref 3.4–10.8)

## 2015-09-15 LAB — HEPATIC FUNCTION PANEL
ALK PHOS: 114 IU/L (ref 39–117)
ALT: 12 IU/L (ref 0–32)
AST: 20 IU/L (ref 0–40)
Albumin: 4.6 g/dL (ref 3.5–4.7)
BILIRUBIN, DIRECT: 0.16 mg/dL (ref 0.00–0.40)
Bilirubin Total: 0.5 mg/dL (ref 0.0–1.2)
TOTAL PROTEIN: 7.4 g/dL (ref 6.0–8.5)

## 2015-09-15 LAB — THYROID PANEL WITH TSH
FREE THYROXINE INDEX: 2.1 (ref 1.2–4.9)
T3 Uptake Ratio: 27 % (ref 24–39)
T4, Total: 7.8 ug/dL (ref 4.5–12.0)
TSH: 1.8 u[IU]/mL (ref 0.450–4.500)

## 2015-09-15 LAB — LIPID PANEL
CHOLESTEROL TOTAL: 199 mg/dL (ref 100–199)
Chol/HDL Ratio: 3.7 ratio units (ref 0.0–4.4)
HDL: 54 mg/dL (ref >39)
LDL Calculated: 113 mg/dL — ABNORMAL HIGH (ref 0–99)
Triglycerides: 158 mg/dL — ABNORMAL HIGH (ref 0–149)
VLDL CHOLESTEROL CAL: 32 mg/dL (ref 5–40)

## 2015-09-15 LAB — VITAMIN D 25 HYDROXY (VIT D DEFICIENCY, FRACTURES): VIT D 25 HYDROXY: 51.1 ng/mL (ref 30.0–100.0)

## 2015-09-15 NOTE — Telephone Encounter (Signed)
Pt notified of results Verbalizes understanding 

## 2015-09-15 NOTE — Telephone Encounter (Signed)
-----   Message from DonErnestina Pennaald W Moore, MD sent at 09/15/2015  7:32 AM EDT ----- The blood sugar is good at 92. The creatinine, the most important kidney function test remains slightly elevated and this is consistent with past readings. We will continue to monitor this. The patient should avoid all NSAIDs like ibuprofen and Aleve. The electrolytes including potassium were all good and within normal limits. The CBC has a normal white blood cell count. The hemoglobin is good at 11.3 and platelet count is adequate. All liver function tests were within normal limits. The vitamin D level was good and within normal limits and the patient should continue with current treatment Cholesterol numbers with traditional lipid testing have a total LDL C that is elevated at 098113 and this number should be less than 100. 9 months ago this was 131. The HDL is good at 54. Triglycerides are elevated at 158 but lower than they have been in the past. The patient should continue with as aggressive therapeutic lifestyle changes as possible which include diet and exercise All thyroid function tests are within normal limits

## 2015-09-16 ENCOUNTER — Telehealth: Payer: Self-pay

## 2015-09-16 NOTE — Telephone Encounter (Signed)
It is okay to change to generic

## 2015-09-16 NOTE — Telephone Encounter (Signed)
[facility] mail order called and stated that brand name Klor Con on back order and unavailable right now. They request to change to generic. Please advise and route to me. I will call pharmacist at [phone] to clarify

## 2015-09-16 NOTE — Telephone Encounter (Signed)
[facility] and gave the ok to change per [facility]

## 2015-10-03 ENCOUNTER — Other Ambulatory Visit: Payer: Medicare Other

## 2015-10-03 DIAGNOSIS — Z1211 Encounter for screening for malignant neoplasm of colon: Secondary | ICD-10-CM

## 2015-10-03 NOTE — Addendum Note (Signed)
Addended by: Margorie JohnJOHNSON, Marcell Pfeifer M on: 10/03/2015 11:54 AM   Modules accepted: Orders

## 2015-10-05 LAB — FECAL OCCULT BLOOD, IMMUNOCHEMICAL: Fecal Occult Bld: POSITIVE — AB

## 2015-10-05 NOTE — Addendum Note (Signed)
Addended by: Almeta MonasSTONE, Zonie Crutcher M on: 10/05/2015 09:45 AM   Modules accepted: Orders

## 2015-10-10 ENCOUNTER — Ambulatory Visit (INDEPENDENT_AMBULATORY_CARE_PROVIDER_SITE_OTHER): Payer: Medicare Other | Admitting: *Deleted

## 2015-10-10 ENCOUNTER — Other Ambulatory Visit: Payer: Medicare Other

## 2015-10-10 DIAGNOSIS — Z1211 Encounter for screening for malignant neoplasm of colon: Secondary | ICD-10-CM

## 2015-10-10 DIAGNOSIS — E538 Deficiency of other specified B group vitamins: Secondary | ICD-10-CM

## 2015-10-10 LAB — CBC WITH DIFFERENTIAL/PLATELET
BASOS ABS: 0 10*3/uL (ref 0.0–0.2)
Basos: 0 %
EOS (ABSOLUTE): 0.3 10*3/uL (ref 0.0–0.4)
Eos: 7 %
Hematocrit: 34.1 % (ref 34.0–46.6)
Hemoglobin: 11.2 g/dL (ref 11.1–15.9)
Immature Grans (Abs): 0 10*3/uL (ref 0.0–0.1)
Immature Granulocytes: 0 %
LYMPHS ABS: 1.1 10*3/uL (ref 0.7–3.1)
Lymphs: 24 %
MCH: 30.3 pg (ref 26.6–33.0)
MCHC: 32.8 g/dL (ref 31.5–35.7)
MCV: 92 fL (ref 79–97)
MONOS ABS: 0.5 10*3/uL (ref 0.1–0.9)
Monocytes: 11 %
Neutrophils Absolute: 2.7 10*3/uL (ref 1.4–7.0)
Neutrophils: 58 %
Platelets: 206 10*3/uL (ref 150–379)
RBC: 3.7 x10E6/uL — AB (ref 3.77–5.28)
RDW: 14.6 % (ref 12.3–15.4)
WBC: 4.7 10*3/uL (ref 3.4–10.8)

## 2015-10-10 NOTE — Progress Notes (Signed)
Pt given B12 injections IM left deltoid and tolerated well. 

## 2015-10-12 LAB — FECAL OCCULT BLOOD, IMMUNOCHEMICAL: FECAL OCCULT BLD: POSITIVE — AB

## 2015-10-26 ENCOUNTER — Ambulatory Visit (INDEPENDENT_AMBULATORY_CARE_PROVIDER_SITE_OTHER): Payer: No Typology Code available for payment source | Admitting: Psychology

## 2015-10-26 DIAGNOSIS — F331 Major depressive disorder, recurrent, moderate: Secondary | ICD-10-CM | POA: Diagnosis not present

## 2015-10-27 ENCOUNTER — Telehealth: Payer: Self-pay | Admitting: Family Medicine

## 2015-10-28 ENCOUNTER — Other Ambulatory Visit: Payer: Self-pay

## 2015-10-28 DIAGNOSIS — I10 Essential (primary) hypertension: Secondary | ICD-10-CM

## 2015-10-28 MED ORDER — LEVOTHYROXINE SODIUM 137 MCG PO TABS: 137.0000 ug | ORAL_TABLET | Freq: Every day | ORAL | 2 refills | Status: DC

## 2015-10-28 MED ORDER — FUROSEMIDE 40 MG PO TABS: 40.0000 mg | ORAL_TABLET | Freq: Every day | ORAL | 0 refills | Status: DC

## 2015-10-28 MED ORDER — LISINOPRIL 40 MG PO TABS: 40.0000 mg | ORAL_TABLET | Freq: Every day | ORAL | 0 refills | Status: DC

## 2015-11-11 ENCOUNTER — Ambulatory Visit (INDEPENDENT_AMBULATORY_CARE_PROVIDER_SITE_OTHER): Payer: Medicare Other

## 2015-11-11 DIAGNOSIS — E538 Deficiency of other specified B group vitamins: Secondary | ICD-10-CM

## 2015-11-14 ENCOUNTER — Ambulatory Visit: Payer: Self-pay | Admitting: Pulmonary Disease

## 2015-11-25 ENCOUNTER — Ambulatory Visit (HOSPITAL_COMMUNITY): Payer: No Typology Code available for payment source | Admitting: Psychology

## 2015-12-13 ENCOUNTER — Ambulatory Visit (INDEPENDENT_AMBULATORY_CARE_PROVIDER_SITE_OTHER): Payer: Medicare Other | Admitting: *Deleted

## 2015-12-13 DIAGNOSIS — E538 Deficiency of other specified B group vitamins: Secondary | ICD-10-CM | POA: Diagnosis not present

## 2015-12-13 NOTE — Progress Notes (Signed)
Vit B12 inj given Tolerated well

## 2015-12-26 ENCOUNTER — Encounter: Payer: Medicare Other | Admitting: *Deleted

## 2015-12-29 ENCOUNTER — Ambulatory Visit (INDEPENDENT_AMBULATORY_CARE_PROVIDER_SITE_OTHER): Payer: Medicare Other

## 2015-12-29 DIAGNOSIS — Z23 Encounter for immunization: Secondary | ICD-10-CM | POA: Diagnosis not present

## 2016-01-13 ENCOUNTER — Ambulatory Visit (INDEPENDENT_AMBULATORY_CARE_PROVIDER_SITE_OTHER): Payer: Medicare Other | Admitting: *Deleted

## 2016-01-13 DIAGNOSIS — E538 Deficiency of other specified B group vitamins: Secondary | ICD-10-CM | POA: Diagnosis not present

## 2016-01-13 NOTE — Progress Notes (Signed)
Pt given Vit B12 inj Tolerated well 

## 2016-01-19 ENCOUNTER — Ambulatory Visit (INDEPENDENT_AMBULATORY_CARE_PROVIDER_SITE_OTHER): Payer: Medicare Other

## 2016-01-19 ENCOUNTER — Ambulatory Visit (INDEPENDENT_AMBULATORY_CARE_PROVIDER_SITE_OTHER): Payer: Medicare Other | Admitting: Family Medicine

## 2016-01-19 ENCOUNTER — Encounter: Payer: Self-pay | Admitting: Family Medicine

## 2016-01-19 VITALS — BP 160/80 | HR 48 | Temp 97.0°F | Ht 64.0 in | Wt 224.0 lb

## 2016-01-19 DIAGNOSIS — E559 Vitamin D deficiency, unspecified: Secondary | ICD-10-CM

## 2016-01-19 DIAGNOSIS — I480 Paroxysmal atrial fibrillation: Secondary | ICD-10-CM

## 2016-01-19 DIAGNOSIS — Z780 Asymptomatic menopausal state: Secondary | ICD-10-CM

## 2016-01-19 DIAGNOSIS — F418 Other specified anxiety disorders: Secondary | ICD-10-CM | POA: Diagnosis not present

## 2016-01-19 DIAGNOSIS — I10 Essential (primary) hypertension: Secondary | ICD-10-CM

## 2016-01-19 DIAGNOSIS — E034 Atrophy of thyroid (acquired): Secondary | ICD-10-CM | POA: Diagnosis not present

## 2016-01-19 DIAGNOSIS — E7800 Pure hypercholesterolemia, unspecified: Secondary | ICD-10-CM

## 2016-01-19 DIAGNOSIS — E538 Deficiency of other specified B group vitamins: Secondary | ICD-10-CM

## 2016-01-19 DIAGNOSIS — I700 Atherosclerosis of aorta: Secondary | ICD-10-CM

## 2016-01-19 DIAGNOSIS — Z13820 Encounter for screening for osteoporosis: Secondary | ICD-10-CM

## 2016-01-19 MED ORDER — ESCITALOPRAM OXALATE 10 MG PO TABS
10.0000 mg | ORAL_TABLET | Freq: Every day | ORAL | 2 refills | Status: DC
Start: 2016-01-19 — End: 2016-05-07

## 2016-01-19 NOTE — Addendum Note (Signed)
Addended by: Magdalene RiverBULLINS, Taylen Osorto H on: 01/19/2016 11:52 AM   Modules accepted: Orders

## 2016-01-19 NOTE — Patient Instructions (Addendum)
Medicare Annual Wellness Visit  [facility] and the medical providers at [facility] strive to bring you the best medical care.  In doing so we not only want to address your current medical conditions and concerns but also to detect new conditions early and prevent illness, disease and health-related problems.    Medicare offers a yearly Wellness Visit which allows our clinical staff to assess your need for preventative services including immunizations, lifestyle education, counseling to decrease risk of preventable diseases and screening for fall risk and other medical concerns.    This visit is provided free of charge (no copay) for all Medicare recipients. The clinical pharmacists at [facility] have begun to conduct these Wellness Visits which will also include a thorough review of all your medications.    As you primary medical provider recommend that you make an appointment for your Annual Wellness Visit if you have not done so already this year.  You may set up this appointment before you leave today or you may call back [phone] and schedule an appointment.  Please make sure when you call that you mention that you are scheduling your Annual Wellness Visit with the clinical pharmacist so that the appointment may be made for the proper length of time.    Continue current medications. Continue good therapeutic lifestyle changes which include good diet and exercise. Fall precautions discussed with patient. If an FOBT was given today- please return it to our front desk. If you are over 80 years old - you may need Prevnar 13 or the adult Pneumonia vaccine.  **Flu shots are available--- please call and schedule a FLU-CLINIC appointment**  After your visit with us today you will receive a survey in the mail or online from [employer] regarding your care with us. Please take a moment to fill this out. Your feedback is very  important to us as you can help us better understand your patient needs as well as improve your experience and satisfaction. WE CARE ABOUT YOU!!!   Continue to take the lorazepam 0.5, one half tablet as needed Take Lexapro to generic 1 daily at bedtime 10 mg Continue to drink plenty of fluids Add ranitidine 150 mg daily before supper. [facility] for a very [facility] cost. Continue omeprazole. Drink plenty of fluids and stay well hydrated.

## 2016-01-19 NOTE — Progress Notes (Signed)
Subjective:    Patient ID: [name], female    DOB: 15-Sep-1932, 82 y.o.   MRN: [id number]  HPI Pt here for follow up and management of chronic medical problems which includes a fib, hyperlipidemia and hypertension. She is taking medications regularly.The patient remains anxious. She is due to get lab work and a DEXA scan. She currently has lorazepam but I am not certain if she is taking this regularly or not. She only takes the lorazepam a half of a heel only if needed and not regularly. She has seen the pulmonologist regarding the smells and irritation upper respiratory tract and GI tract. She is also had an endoscopy and a colonoscopy according to her history and this was done in September. She said the gastroenterologist did see some redness in her esophagus that the colonoscopy was okay according to her memory of this. She denies any chest pain or significant shortness of breath. She does have some feelings of nausea at times and has been on the omeprazole for this but still has these feelings of nausea. She denies any blood in the stool or black tarry bowel movements. She is passing her water without problems. She admits today that a lot of her problems may be anxiety driven. She has now moved to a third apartment and stumble. She is also having some the same symptoms of odor is bothering her at this appointment and the caretaker there is a she's never had anyone to complain of this before. I think the patient is willing to admit that a lot of the issues have been anxiety related. The patient is it is still dealing with the death of her grandson that lives with her who was diagnosed with death from overdose of medication.    Patient Active Problem List   Diagnosis Date Noted  . Hearing loss 01/06/2015  . Thoracic aorta atherosclerosis ([place]) 03/04/2014  . Degenerative arthritis of thoracic spine 03/04/2014  . Osteoporosis, post-menopausal 08/12/2013  . Metabolic syndrome 17/91/5056  .  Anemia, iron deficiency 03/16/2013  . Vitamin D deficiency 03/16/2013  . HTN (hypertension) 03/16/2013  . Hypothyroidism   . Diaphragmatic hernia without mention of obstruction or gangrene   . Esophagitis   . Hyperlipidemia   . Prolapse of vaginal walls without mention of uterine prolapse   . Arthritis   . First degree atrioventricular block   . Symptomatic menopausal or female climacteric states   . AF (atrial fibrillation) ([place])   . Rectal polyp   . Gastric polyp    Outpatient Encounter Prescriptions as of 01/19/2016  Medication Sig  . albuterol (PROVENTIL HFA;VENTOLIN HFA) 108 (90 Base) MCG/ACT inhaler Inhale 2 puffs into the lungs every 4 (four) hours as needed for wheezing or shortness of breath.  . beclomethasone (QVAR) 40 MCG/ACT inhaler Inhale 2 puffs into the lungs 2 (two) times daily.  . calcium-vitamin D (OSCAL WITH D) 500-200 MG-UNIT per tablet Take 1 tablet by mouth daily.  . Cholecalciferol (VITAMIN D3) 2000 UNITS TABS Take 1 tablet by mouth daily.    . ferrous fumarate-iron polysaccharide complex (TANDEM) 162-115.2 MG CAPS Take 1 capsule by mouth daily with breakfast.  . furosemide (LASIX) 40 MG tablet Take 1 tablet (40 mg total) by mouth daily.  . hydrocortisone (PROCTOCORT) 1 % CREA Apply to area BID PRN  . KLOR-CON 10 10 MEQ tablet Take 1 tablet (10 mEq total) by mouth daily. Dispense Klor Con only  . levothyroxine (SYNTHROID, LEVOTHROID) 137 MCG tablet Take 1 tablet (  137 mcg total) by mouth daily. As directed  . lisinopril (PRINIVIL,ZESTRIL) 40 MG tablet Take 1 tablet (40 mg total) by mouth daily.  Marland Kitchen LORazepam (ATIVAN) 0.5 MG tablet One half tablet to one daily if needed for anxiety  . Multiple Vitamins-Minerals (CENTRUM SILVER) tablet Take 1 tablet by mouth daily.   . pantoprazole (PROTONIX) 20 MG tablet Take 1 tablet (20 mg total) by mouth daily before breakfast.  . tiZANidine (ZANAFLEX) 2 MG tablet Take 1 tablet (2 mg total) by mouth every 8 (eight) hours as needed  for muscle spasms.   Facility-Administered Encounter Medications as of 01/19/2016  Medication  . cyanocobalamin ((VITAMIN B-12)) injection 1,000 mcg      Review of Systems  Constitutional: Negative.   HENT: Negative.   Eyes: Negative.   Respiratory: Negative.   Cardiovascular: Negative.   Gastrointestinal: Negative.   Endocrine: Negative.   Genitourinary: Negative.   Musculoskeletal: Negative.   Skin: Negative.   Allergic/Immunologic: Negative.   Neurological: Negative.   Hematological: Negative.   Psychiatric/Behavioral: The patient is nervous/anxious.        Objective:   Physical Exam  Constitutional: She is oriented to person, place, and time. She appears well-developed and well-nourished. No distress.  Pleasant but somewhat anxious today because of having moved 3 different times and still dealing with the loss of her grandson.  HENT:  Head: Normocephalic and atraumatic.  Right Ear: External ear normal.  Left Ear: External ear normal.  Nose: Nose normal.  Mouth/Throat: Oropharynx is clear and moist.  Eyes: Conjunctivae and EOM are normal. Pupils are equal, round, and reactive to light. Right eye exhibits no discharge. Left eye exhibits no discharge. No scleral icterus.  Neck: Normal range of motion. Neck supple. No thyromegaly present.  Cardiovascular: Normal rate, regular rhythm, normal heart sounds and intact distal pulses.   No murmur heard. Pulmonary/Chest: Effort normal and breath sounds normal. No respiratory distress. She has no wheezes. She has no rales.  Clear anteriorly and posteriorly  Abdominal: Soft. Bowel sounds are normal. She exhibits no mass. There is no tenderness. There is no rebound and no guarding.  Abdominal obesity with slight epigastric tenderness. Is important to note that the patient recently had an endoscopy and a colonoscopy. She was told by the endoscopist that she has some slight redness in her esophagus. She is taking omeprazole for this.    Musculoskeletal: Normal range of motion. She exhibits edema. She exhibits no tenderness.  The patient is wearing support hose. There may be some slight pedal edema with these.  Lymphadenopathy:    She has no cervical adenopathy.  Neurological: She is alert and oriented to person, place, and time. She has normal reflexes. No cranial nerve deficit.  Skin: Skin is warm and dry. No rash noted.  Psychiatric: She has a normal mood and affect. Her behavior is normal. Judgment and thought content normal.  Anxious and depressed.  Nursing note and vitals reviewed.  BP (!) 160/62 (BP Location: Left Arm)   Pulse (!) 48   Temp 97 F (36.1 C) (Oral)   Ht '5\' 4"'  (1.626 m)   Wt 224 lb (101.6 kg)   BMI 38.45 kg/m   Repeat blood pressure 160/80 right arm large cuff sitting      Assessment & Plan:  1. Vitamin B 12 deficiency -Check B12 level today - CBC with Differential/Platelet  2. Essential hypertension -The blood pressure was elevated on the second check today at 160/80. The patient admits to being  very anxious because of our discussion about her on anxiety and distress she has with dealing with the death of her grandson. She will bring readings by in 3-4 weeks and have the nurse recheck her blood pressure at that time. She will also try to restrict the sodium intake as she admits to eating more salt. - BMP8+EGFR - CBC with Differential/Platelet - Hepatic function panel  3. Paroxysmal atrial fibrillation (HCC) -The heart had a regular rate and rhythm today at 60/m - CBC with Differential/Platelet  4. Thoracic aorta atherosclerosis ([place]) -Continue with aggressive therapeutic lifestyle changes - CBC with Differential/Platelet  5. Hypothyroidism due to acquired atrophy of thyroid -Continue with current treatment pending results of lab work - CBC with Differential/Platelet - Thyroid Panel With TSH  6. Vitamin D deficiency -Continue with current treatment pending results of lab work -  CBC with Differential/Platelet - VITAMIN D 25 Hydroxy (Vit-D Deficiency, Fractures) - DG [facility] DEXA; Future  7. Pure hypercholesterolemia -Continue with aggressive there. Lifestyle changes - CBC with Differential/Platelet - Lipid panel  8. Screening for osteoporosis - DG [facility] DEXA; Future  9. Postmenopausal - DG [facility] DEXA; Future  10. Situational anxiety -Start Lexapro 10 mg generic 1 at bedtime and continue with lorazepam 0.5 one half tablet as needed -Avoid caffeine  No orders of the defined types were placed in this encounter.  Patient Instructions                       Medicare Annual Wellness Visit  Center and the medical providers at [facility] strive to bring you the best medical care.  In doing so we not only want to address your current medical conditions and concerns but also to detect new conditions early and prevent illness, disease and health-related problems.    Medicare offers a yearly Wellness Visit which allows our clinical staff to assess your need for preventative services including immunizations, lifestyle education, counseling to decrease risk of preventable diseases and screening for fall risk and other medical concerns.    This visit is provided free of charge (no copay) for all Medicare recipients. The clinical pharmacists at [facility] have begun to conduct these Wellness Visits which will also include a thorough review of all your medications.    As you primary medical provider recommend that you make an appointment for your Annual Wellness Visit if you have not done so already this year.  You may set up this appointment before you leave today or you may call back ([phone]) and schedule an appointment.  Please make sure when you call that you mention that you are scheduling your Annual Wellness Visit with the clinical pharmacist so that the appointment may be made for the proper length of time.    Continue  current medications. Continue good therapeutic lifestyle changes which include good diet and exercise. Fall precautions discussed with patient. If an FOBT was given today- please return it to our front desk. If you are over 31 years old - you may need Prevnar 73 or the adult Pneumonia vaccine.  **Flu shots are available--- please call and schedule a FLU-CLINIC appointment**  After your visit with [place] today you will receive a survey in the mail or online from [employer] regarding your care with [place]. Please take a moment to fill this out. Your feedback is very important to [place] as you can help [place] better understand your patient needs as well as improve  your experience and satisfaction. WE CARE ABOUT YOU!!!   Continue to take the lorazepam 0.5, one half tablet as needed Take Lexapro to generic 1 daily at bedtime 10 mg Continue to drink plenty of fluids Add ranitidine 150 mg daily before supper. This can be purchased at [facility] for a very low cost. Continue omeprazole. Drink plenty of fluids and stay well hydrated.    Arrie Senate MD

## 2016-01-20 LAB — CBC WITH DIFFERENTIAL/PLATELET
BASOS: 0 %
Basophils Absolute: 0 10*3/uL (ref 0.0–0.2)
EOS (ABSOLUTE): 0.2 10*3/uL (ref 0.0–0.4)
Eos: 5 %
HEMATOCRIT: 33.2 % — AB (ref 34.0–46.6)
Hemoglobin: 10.8 g/dL — ABNORMAL LOW (ref 11.1–15.9)
IMMATURE GRANULOCYTES: 0 %
Immature Grans (Abs): 0 10*3/uL (ref 0.0–0.1)
Lymphocytes Absolute: 1.4 10*3/uL (ref 0.7–3.1)
Lymphs: 28 %
MCH: 29.3 pg (ref 26.6–33.0)
MCHC: 32.5 g/dL (ref 31.5–35.7)
MCV: 90 fL (ref 79–97)
MONOS ABS: 0.5 10*3/uL (ref 0.1–0.9)
Monocytes: 10 %
NEUTROS ABS: 2.8 10*3/uL (ref 1.4–7.0)
NEUTROS PCT: 57 %
Platelets: 212 10*3/uL (ref 150–379)
RBC: 3.68 x10E6/uL — ABNORMAL LOW (ref 3.77–5.28)
RDW: 13.7 % (ref 12.3–15.4)
WBC: 4.9 10*3/uL (ref 3.4–10.8)

## 2016-01-20 LAB — BMP8+EGFR
BUN / CREAT RATIO: 16 (ref 12–28)
BUN: 17 mg/dL (ref 8–27)
CO2: 29 mmol/L (ref 18–29)
CREATININE: 1.04 mg/dL — AB (ref 0.57–1.00)
Calcium: 9.6 mg/dL (ref 8.7–10.3)
Chloride: 99 mmol/L (ref 96–106)
GFR, EST AFRICAN AMERICAN: 58 mL/min/{1.73_m2} — AB (ref >59)
GFR, EST NON AFRICAN AMERICAN: 51 mL/min/{1.73_m2} — AB (ref >59)
Glucose: 96 mg/dL (ref 65–99)
POTASSIUM: 4.2 mmol/L (ref 3.5–5.2)
SODIUM: 144 mmol/L (ref 134–144)

## 2016-01-20 LAB — LIPID PANEL
CHOL/HDL RATIO: 3.5 ratio (ref 0.0–4.4)
Cholesterol, Total: 177 mg/dL (ref 100–199)
HDL: 51 mg/dL (ref >39)
LDL CALC: 103 mg/dL — AB (ref 0–99)
Triglycerides: 116 mg/dL (ref 0–149)
VLDL CHOLESTEROL CAL: 23 mg/dL (ref 5–40)

## 2016-01-20 LAB — THYROID PANEL WITH TSH
Free Thyroxine Index: 2.4 (ref 1.2–4.9)
T3 UPTAKE RATIO: 28 % (ref 24–39)
T4 TOTAL: 8.4 ug/dL (ref 4.5–12.0)
TSH: 1.95 u[IU]/mL (ref 0.450–4.500)

## 2016-01-20 LAB — VITAMIN D 25 HYDROXY (VIT D DEFICIENCY, FRACTURES): Vit D, 25-Hydroxy: 58.5 ng/mL (ref 30.0–100.0)

## 2016-01-20 LAB — HEPATIC FUNCTION PANEL
ALT: 13 IU/L (ref 0–32)
AST: 24 IU/L (ref 0–40)
Albumin: 4.6 g/dL (ref 3.5–4.7)
Alkaline Phosphatase: 104 IU/L (ref 39–117)
BILIRUBIN TOTAL: 0.6 mg/dL (ref 0.0–1.2)
BILIRUBIN, DIRECT: 0.17 mg/dL (ref 0.00–0.40)
Total Protein: 7.5 g/dL (ref 6.0–8.5)

## 2016-01-26 ENCOUNTER — Encounter: Payer: Self-pay | Admitting: *Deleted

## 2016-02-01 NOTE — Progress Notes (Signed)
The patient continues to have a great deal of difficulty with regard to reacting to various chemicals and smells. We talked about this in depth and talked about the possibility that this has nothing to do with particular individual sites but has to do with the way her body is reacting to various smells.   Hershal CoriaODENBOUGH,JOHN R, PsyD 02/01/2016

## 2016-02-01 NOTE — Progress Notes (Signed)
The patient continues to have a great deal of difficulty with regard to reacting to various chemicals and smells. We talked about this in depth and talked about the possibility that this has nothing to do with particular individual sites but has to do with the way her body is reacting to various smells.   [name] R, PsyD 02/01/2016         

## 2016-02-11 ENCOUNTER — Other Ambulatory Visit: Payer: Self-pay

## 2016-02-11 ENCOUNTER — Ambulatory Visit (INDEPENDENT_AMBULATORY_CARE_PROVIDER_SITE_OTHER): Payer: Medicare Other | Admitting: Pediatrics

## 2016-02-11 ENCOUNTER — Other Ambulatory Visit: Payer: Self-pay | Admitting: Family Medicine

## 2016-02-11 VITALS — BP 146/64 | HR 50 | Temp 96.9°F | Ht 64.0 in | Wt 224.0 lb

## 2016-02-11 DIAGNOSIS — S90819A Abrasion, unspecified foot, initial encounter: Secondary | ICD-10-CM

## 2016-02-11 DIAGNOSIS — R600 Localized edema: Secondary | ICD-10-CM | POA: Diagnosis not present

## 2016-02-11 DIAGNOSIS — I10 Essential (primary) hypertension: Secondary | ICD-10-CM

## 2016-02-11 MED ORDER — LISINOPRIL 40 MG PO TABS: 40.0000 mg | ORAL_TABLET | Freq: Every day | ORAL | 0 refills | Status: DC

## 2016-02-11 MED ORDER — MUPIROCIN 2 % EX OINT: TOPICAL_OINTMENT | CUTANEOUS | 1 refills | Status: DC

## 2016-02-11 MED ORDER — FUROSEMIDE 40 MG PO TABS: 40.0000 mg | ORAL_TABLET | Freq: Every day | ORAL | 0 refills | Status: DC

## 2016-02-11 NOTE — Patient Instructions (Signed)
Take lasix in the morning and after lunch for 3 days for the swelling in your feet  Then go back to taking it once a day in the morning  Let us know if swelling does not improve

## 2016-02-11 NOTE — Progress Notes (Signed)
  Subjective:   Patient ID: [name], female    DOB: April 28, 1932, 82 y.o.   MRN: [id number] CC: Bilateral legs swelling and burning  HPI: [name] is a 82 y.o. female presenting for Bilateral legs swelling and burning  Had a pedicure 5 days ago Was a new person, was much more rough on her feet than usual pt says it hurt but she didn't want to say anything so "toughed it out" B/l feet were red after pedicure Yesterday noticed small cut on bottom of L foot  Has had swelling in LE regularly Takes lasix once a day in the morning No worsening SOB, CP  Relevant past medical, surgical, family and social history reviewed. Allergies and medications reviewed and updated. History  Smoking Status  . Never Smoker  Smokeless Tobacco  . Never Used   ROS: Per HPI   Objective:    BP (!) 146/64   Pulse (!) 50   Temp (!) 96.9 F (36.1 C) (Oral)   Ht 5\' 4"  (1.626 m)   Wt 224 lb (101.6 kg)   BMI 38.45 kg/m   Wt Readings from Last 3 Encounters:  02/11/16 224 lb (101.6 kg)  01/19/16 224 lb (101.6 kg)  09/14/15 228 lb (103.4 kg)    Gen: NAD, alert, cooperative with exam, NCAT EYES: EOMI, no conjunctival injection, or no icterus CV: NRRR, normal S1/S2, no murmur, distal pulses 2+ b/l Resp: CTABL, no wheezes, normal WOB Abd: +BS, soft, NTND. no guarding or organomegaly Ext: 1-2+ pitting edema b/l lower legs to midway up shin, warm Neuro: Alert and oriented Skin: R foot with superficial abrasions in cracks at base of toes 2-3 R foot, 1st toe L foot, minimal amount of blood present. No surrounding redness, no discharge  Assessment & Plan:  [name] was seen today for bilateral legs swelling and burning.  Diagnoses and all orders for this visit:  Abrasion of foot, unspecified laterality, initial encounter Avoid pedicures Use mupirocin on abrasions for next week or until skin is closed -     mupirocin ointment (BACTROBAN) 2 %; Use twice a day on affected areas  Essential  hypertension Initially elevated, improved with recheck  Bilateral lower extremity edema Increase lasix to twice a day for 3 days, then go back to daily Cont compression hose, elevate feet when able Let us know if swelling continues Weight stable  Follow up plan: As scheduled Rex Krasarol Crystalmarie Yasin, MD [facility]

## 2016-02-14 ENCOUNTER — Ambulatory Visit: Payer: Medicare Other

## 2016-02-14 ENCOUNTER — Ambulatory Visit: Payer: Medicare Other | Admitting: Pharmacist

## 2016-02-15 ENCOUNTER — Ambulatory Visit (INDEPENDENT_AMBULATORY_CARE_PROVIDER_SITE_OTHER): Payer: Medicare Other | Admitting: *Deleted

## 2016-02-15 DIAGNOSIS — E538 Deficiency of other specified B group vitamins: Secondary | ICD-10-CM

## 2016-02-15 NOTE — Progress Notes (Signed)
Pt given Vit B12 inj Tolerated well 

## 2016-02-16 ENCOUNTER — Encounter: Payer: Self-pay | Admitting: Pharmacist

## 2016-02-16 ENCOUNTER — Ambulatory Visit (INDEPENDENT_AMBULATORY_CARE_PROVIDER_SITE_OTHER): Payer: Medicare Other | Admitting: Pharmacist

## 2016-02-16 DIAGNOSIS — M810 Age-related osteoporosis without current pathological fracture: Secondary | ICD-10-CM

## 2016-02-16 NOTE — Patient Instructions (Signed)
Exercise for Strong Bones  Exercise is important to build and maintain strong bones / bone density.  There are 2 types of exercises that are important to building and maintaining strong bones:  Weight- bearing and muscle-stregthening.  Weight-bearing Exercises  These exercises include activities that make you move against gravity while staying upright. Weight-bearing exercises can be high-impact or low-impact.  High-impact weight-bearing exercises help build bones and keep them strong. If you have broken a bone due to osteoporosis or are at risk of breaking a bone, you may need to avoid high-impact exercises. If you're not sure, you should check with your healthcare provider.  Examples of high-impact weight-bearing exercises are: Dancing  Doing high-impact aerobics  Hiking  Jogging/running  Jumping Rope  Stair climbing  Tennis  Low-impact weight-bearing exercises can also help keep bones strong and are a safe alternative if you cannot do high-impact exercises.   Examples of low-impact weight-bearing exercises are: Using elliptical training machines  Doing low-impact aerobics  Using stair-step machines  Fast walking on a treadmill or outside   Muscle-Strengthening Exercises These exercises include activities where you move your body, a weight or some other resistance against gravity. They are also known as resistance exercises and include: Lifting weights  Using elastic exercise bands  Using weight machines  Lifting your own body weight  Functional movements, such as standing and rising up on your toes  Yoga and Pilates can also improve strength, balance and flexibility. However, certain positions may not be safe for people with osteoporosis or those at increased risk of broken bones. For example, exercises that have you bend forward may increase the chance of breaking a bone in the spine.   Non-Impact Exercises There are other types of exercises that can help  prevent falls.  Non-impact exercises can help you to improve balance, posture and how well you move in everyday activities. Some of these exercises include: Balance exercises that strengthen your legs and test your balance, such as Tai Chi, can decrease your risk of falls.  Posture exercises that improve your posture and reduce rounded or "sloping" shoulders can help you decrease the chance of breaking a bone, especially in the spine.  Functional exercises that improve how well you move can help you with everyday activities and decrease your chance of falling and breaking a bone. For example, if you have trouble getting up from a chair or climbing stairs, you should do these activities as exercises.   **A physical therapist can teach you balance, posture and functional exercises. He/she can also help you learn which exercises are safe and appropriate for you.   has a physical therapy office in [place] in front of our office and referrals can be made for assessments and treatment as needed and strength and balance training.  If you would like to have an assessment with [name] and our physical therapy team please let a nurse or provider know.   Fall Prevention in the Home Falls can cause injuries and can affect people from all age groups. There are many simple things that you can do to make your home safe and to help prevent falls. What can I do on the outside of my home?  Regularly repair the edges of walkways and driveways and fix any cracks.  Remove high doorway thresholds.  Trim any shrubbery on the main path into your home.  Use bright outdoor lighting.  Clear walkways of debris and clutter, including tools and rocks.  Regularly check that handrails   are securely fastened and in good repair. Both sides of any steps should have handrails.  Install guardrails along the edges of any raised decks or porches.  Have leaves, snow, and ice cleared regularly.  Use sand or salt on  walkways during winter months.  In the garage, clean up any spills right away, including grease or oil spills. What can I do in the bathroom?  Use night lights.  Install grab bars by the toilet and in the tub and shower. Do not use towel bars as grab bars.  Use non-skid mats or decals on the floor of the tub or shower.  If you need to sit down while you are in the shower, use a plastic, non-slip stool.  Keep the floor dry. Immediately clean up any water that spills on the floor.  Remove soap buildup in the tub or shower on a regular basis.  Attach bath mats securely with double-sided non-slip rug tape.  Remove throw rugs and other tripping hazards from the floor. What can I do in the bedroom?  Use night lights.  Make sure that a bedside light is easy to reach.  Do not use oversized bedding that drapes onto the floor.  Have a firm chair that has side arms to use for getting dressed.  Remove throw rugs and other tripping hazards from the floor. What can I do in the kitchen?  Clean up any spills right away.  Avoid walking on wet floors.  Place frequently used items in easy-to-reach places.  If you need to reach for something above you, use a sturdy step stool that has a grab bar.  Keep electrical cables out of the way.  Do not use floor polish or wax that makes floors slippery. If you have to use wax, make sure that it is non-skid floor wax.  Remove throw rugs and other tripping hazards from the floor. What can I do in the stairways?  Do not leave any items on the stairs.  Make sure that there are handrails on both sides of the stairs. Fix handrails that are broken or loose. Make sure that handrails are as long as the stairways.  Check any carpeting to make sure that it is firmly attached to the stairs. Fix any carpet that is loose or worn.  Avoid having throw rugs at the top or bottom of stairways, or secure the rugs with carpet tape to prevent them from  moving.  Make sure that you have a light switch at the top of the stairs and the bottom of the stairs. If you do not have them, have them installed. What are some other fall prevention tips?  Wear closed-toe shoes that fit well and support your feet. Wear shoes that have rubber soles or low heels.  When you use a stepladder, make sure that it is completely opened and that the sides are firmly locked. Have someone hold the ladder while you are using it. Do not climb a closed stepladder.  Add color or contrast paint or tape to grab bars and handrails in your home. Place contrasting color strips on the first and last steps.  Use mobility aids as needed, such as canes, walkers, scooters, and crutches.  Turn on lights if it is dark. Replace any light bulbs that burn out.  Set up furniture so that there are clear paths. Keep the furniture in the same spot.  Fix any uneven floor surfaces.  Choose a carpet design that does not hide the edge  of steps of a stairway.  Be aware of any and all pets.  Review your medicines with your healthcare provider. Some medicines can cause dizziness or changes in blood pressure, which increase your risk of falling. Talk with your health care provider about other ways that you can decrease your risk of falls. This may include working with a physical therapist or trainer to improve your strength, balance, and endurance. This information is not intended to replace advice given to you by your health care provider. Make sure you discuss any questions you have with your health care provider. Document Released: 03/09/2002 Document Revised: 08/16/2015 Document Reviewed: 04/23/2014 Elsevier Interactive Patient Education  2017 [employer].

## 2016-02-16 NOTE — Progress Notes (Signed)
Patient ID: [name], female   DOB: 1933-03-26, 82 y.o.   MRN: [id number]   HPI: [name] is an 82yo white female. She is referred by PCP Dr Rudi Heaponald Moore to review results of BMD / DEXA from 01/19/2016.  Patient has a past history of osteopenia and has refused pharmacotherapy in the past.  Her last BMD show significant BMD decrease and osteoporosis.   Back Pain?  Yes       Kyphosis?  No Prior fracture?  Yes - shoulder and 3 ribs last year after fall in 2014 Med(s) for Osteoporosis/Osteopenia:  None currently  Med(s) previously tried for Osteoporosis/Osteopenia:  Fosamax but patient cannot remember why she did not continue                                                             PMH: Age at menopause:  82 yo Hysterectomy?  No Oophorectomy?  No HRT? Yes - Former.  Type/duration: prempro Steroid Use?  No Thyroid med?  Yes History of cancer?  No History of digestive disorders (ie Crohn's)?  Yes - hiatal hernia; recent endoscopy - no significant problems noted per report but patient was started on PPI due to suspicion of GERD and patient reported GI irritation  Current or previous eating disorders?  No Last Vitamin D Result:  58.5 (01/19/2016) Last GFR Result:  51 (01/19/2016)  Fall Risk  02/16/2016 01/19/2016 09/14/2015 06/30/2015 06/14/2015  Falls in the past year? Yes Yes Yes No Yes  Number falls in past yr: 1 2 or more 2 or more - 1  Injury with Fall? No No Yes - No  Risk Factor Category  - - - - -  Follow up Falls prevention discussed - - - -      FH/SH: Family history of osteoporosis?  No Parent with history of hip fracture?  No Family history of breast cancer?  Yes - daughter Exercise?  Yes - walking a little Smoking?  No Alcohol?  No    Calcium Assessment Calcium Intake  # of servings/day  Calcium mg  Milk (8 oz) 0  x  300  = 0  Yogurt (4 oz) 0 x  200 = 0  Cheese (1 oz) 1 x  200 = 200mg   Other Calcium sources   250mg   Ca supplement Calcium 600mg  qd and MVI qd =  1000mg    Estimated calcium intake per day 1350mg     DEXA Results Date of Test T-Score for AP Spine L1-L4 T-Score for Neck of  Left Hip  01/19/2016 -0.9 -2.5  08/12/2013 -0.6 -2.0  05/09/2011 -0.8 -2.2  03/24/2008 -0.5 -2.1  03/18/2006 -0.5 -2.0   Assessment: Osteoporosis   Recommendations: 1.  Discussed treatment options. Would not start oral bisphosphates due to GERD.  Discussed Reclast and Prolia treatments - pros and cons.  Patient given written information to review. Will follow up in 2 weeks to see which treatment patient prefers.   2.  continue calcium 1200mg  daily through supplementation or diet. 3.  recommend weight bearing exercise - 30 minutes at least 4 days per week.   4.  Counseled and educated about fall risk and prevention.  Recheck DEXA:  2 years  Time spent counseling patient:  30 minutes  [name], [name], [name]  Patient ID: [name], female   DOB: May 19, 1932, 82 y.o.   MRN: [id number]

## 2016-02-24 ENCOUNTER — Telehealth: Payer: Self-pay | Admitting: Family Medicine

## 2016-02-24 ENCOUNTER — Other Ambulatory Visit: Payer: Self-pay | Admitting: Family Medicine

## 2016-02-24 MED ORDER — AMOXICILLIN-POT CLAVULANATE 875-125 MG PO TABS: 1.0000 | ORAL_TABLET | Freq: Two times a day (BID) | ORAL | 0 refills | Status: DC

## 2016-02-24 NOTE — Telephone Encounter (Signed)
Patient aware that Rx for antibiotic has been sent to pharmacy.  Per Dr. Darlyn ReadStacks

## 2016-02-24 NOTE — Telephone Encounter (Signed)
I sent in the requested prescription 

## 2016-03-07 ENCOUNTER — Encounter: Payer: Self-pay | Admitting: Family Medicine

## 2016-03-07 ENCOUNTER — Ambulatory Visit (INDEPENDENT_AMBULATORY_CARE_PROVIDER_SITE_OTHER): Payer: Medicare Other | Admitting: Family Medicine

## 2016-03-07 VITALS — BP 169/70 | HR 48 | Temp 97.1°F | Ht 64.0 in | Wt 220.8 lb

## 2016-03-07 DIAGNOSIS — I878 Other specified disorders of veins: Secondary | ICD-10-CM

## 2016-03-07 DIAGNOSIS — S93401A Sprain of unspecified ligament of right ankle, initial encounter: Secondary | ICD-10-CM | POA: Diagnosis not present

## 2016-03-07 NOTE — Progress Notes (Signed)
   HPI  Patient presents today for leg and foot swelling.  Patient explains that she was previously seen on November 11,  5 days after getting a pedicure. She had bilateral erythematous feet at that time she was concerned about infection.  Exam should not have any evidence of infection, she was given mupirocin ointment and recommended to have conservative therapy for swelling.  About 10 days ago she called in and received a prescription for Augmentin due to an erythematous lesion that was developing on the side of her leg. That has improved after the Augmentin course.  She states that now she has some right lateral malleolar tenderness with moving her ankle. She states that whenever she had her pedicure her foot was held in twisted in a very different position for quite some time. She is afraid that she may have a mild foot sprain.   PMH: Smoking status noted ROS: Per HPI  Objective: BP (!) 169/70   Pulse (!) 48   Temp 97.1 F (36.2 C) (Oral)   Ht 5\' 4"  (1.626 m)   Wt 220 lb 12.8 oz (100.2 kg)   BMI 37.90 kg/m  Gen: NAD, alert, cooperative with exam HEENT: NCAT CV: RRR, good S1/S2, no murmur Resp: CTABL, no wheezes, non-labored Ext:  1+ pitting edema bilateral lower extremities, 2+ dorsalis pedis pulses bilateral feet, brisk cap refill, scattered slightly erythematous patches on the bilateral lower extremities on the lower one third of the lower extremity, no areas of induration, warmth, fluctuance, or drainage. Neuro: Alert and oriented, No gross deficits  Assessment and plan:  # Venous stasis, ankle sprain Exam consistent with venous stasis and possible mild ankle sprain She has mild tenderness to palpation of the ligaments just distal to the right lateral malleolus Recommended conservative therapy with elevating legs and compression stockings No joint instability, conservative therapy for mild sprain     Murtis SinkSam Bradshaw, MD [facility] 03/07/2016,  3:13 PM

## 2016-03-07 NOTE — Patient Instructions (Addendum)
Great to see you!  Wear your compression stockings as often as possible and elevate your legs for a few hours per day.   Please let us know if you have any concerns

## 2016-03-13 ENCOUNTER — Encounter: Payer: Self-pay | Admitting: Family Medicine

## 2016-03-13 ENCOUNTER — Ambulatory Visit (INDEPENDENT_AMBULATORY_CARE_PROVIDER_SITE_OTHER): Payer: Medicare Other | Admitting: Family Medicine

## 2016-03-13 VITALS — BP 155/70 | HR 54 | Temp 97.6°F | Ht 64.0 in | Wt 218.6 lb

## 2016-03-13 DIAGNOSIS — R51 Headache: Secondary | ICD-10-CM | POA: Diagnosis not present

## 2016-03-13 DIAGNOSIS — I10 Essential (primary) hypertension: Secondary | ICD-10-CM

## 2016-03-13 DIAGNOSIS — R519 Headache, unspecified: Secondary | ICD-10-CM

## 2016-03-13 MED ORDER — CLONIDINE HCL 0.1 MG/24HR TD PTWK: 0.1000 mg | MEDICATED_PATCH | TRANSDERMAL | 12 refills | Status: DC

## 2016-03-13 NOTE — Patient Instructions (Signed)
Great to see you!  Start clonidine patch  Come back in 3-4 weeks for follow up with Dr. Christell ConstantMoore or me.

## 2016-03-13 NOTE — Progress Notes (Signed)
   HPI  Patient presents today here with mild headache and elevated blood pressure.  Patient states over the last week or so she's had intermittent headaches, she states that whenever she started checking her blood pressure, concerned that her blood pressure may be causing headaches, and were elevated, they've been as high as 197/83, this morning it was 193/83, last night it was 164/97  Patient denies any falls or head injuries. She does not have any new weakness, numbness, or tingling. She reports good medication compliance and brisk diuresis from Lasix.  Describes HA as head fullness  PMH: Smoking status noted ROS: Per HPI  Objective: BP (!) 155/70   Pulse (!) 54   Temp 97.6 F (36.4 C) (Oral)   Ht 5\' 4"  (1.626 m)   Wt 218 lb 9.6 oz (99.2 kg)   BMI 37.52 kg/m  Gen: NAD, alert, cooperative with exam HEENT: NCAT, PERRLA, EOMI CV: RRR, good S1/S2, no murmur Resp: CTABL, no wheezes, non-labored Ext: compression stockings in place Neuro: Alert and oriented, strength 5/5 and sensation intact in all 4 extremities  Assessment and plan:  # HTN Uncontrolled, recent elevation without any clear reason. Patient reports brisk diuresis with Lasix, good medication compliance No increased salt intake No head injuries, neuro exam is reassuring, no signs of IC bleed Bradycardia is stable, has 1st degree HB NO CCB due to leg swelling, no Diurectic with loop on board, Ace is max dose. Avoiding BB with bradycardia Start clonidine patch   RTC in 3-4 weeks for f/u  Meds ordered this encounter  Medications  . cloNIDine (CATAPRES - DOSED IN MG/24 HR) 0.1 mg/24hr patch    Sig: Place 1 patch (0.1 mg total) onto the skin once a week.    Dispense:  4 patch    Refill:  12    [name] SinkSam Elfreida Heggs, MD [facility] 03/13/2016, 2:49 PM

## 2016-03-16 NOTE — Progress Notes (Signed)
Tried to call patient to follow up treatment discission regarding osteoporosis treatment.  Patient was unavailable and left message on VM to call office to discuss.

## 2016-03-19 ENCOUNTER — Ambulatory Visit (INDEPENDENT_AMBULATORY_CARE_PROVIDER_SITE_OTHER): Payer: Medicare Other | Admitting: *Deleted

## 2016-03-19 DIAGNOSIS — E538 Deficiency of other specified B group vitamins: Secondary | ICD-10-CM | POA: Diagnosis not present

## 2016-03-19 NOTE — Progress Notes (Signed)
Pt given vit B12 inj Tolerated well 

## 2016-03-28 ENCOUNTER — Ambulatory Visit (INDEPENDENT_AMBULATORY_CARE_PROVIDER_SITE_OTHER): Payer: Medicare Other | Admitting: Family Medicine

## 2016-03-28 ENCOUNTER — Encounter: Payer: Self-pay | Admitting: Family Medicine

## 2016-03-28 VITALS — BP 138/88 | HR 92 | Temp 97.1°F | Ht 64.0 in | Wt 221.5 lb

## 2016-03-28 DIAGNOSIS — K5900 Constipation, unspecified: Secondary | ICD-10-CM | POA: Diagnosis not present

## 2016-03-28 DIAGNOSIS — K2100 Gastro-esophageal reflux disease with esophagitis, without bleeding: Secondary | ICD-10-CM

## 2016-03-28 MED ORDER — POLYETHYLENE GLYCOL 3350 17 GM/SCOOP PO POWD: 17.0000 g | Freq: Every day | ORAL | 1 refills | Status: DC | PRN

## 2016-03-28 MED ORDER — OMEPRAZOLE 20 MG PO CPDR: 20.0000 mg | DELAYED_RELEASE_CAPSULE | Freq: Two times a day (BID) | ORAL | 2 refills | Status: DC

## 2016-03-28 NOTE — Progress Notes (Signed)
BP 138/88   Pulse 92   Temp 97.1 F (36.2 C) (Oral)   Ht 5\' 4"  (1.626 m)   Wt 221 lb 8 oz (100.5 kg)   BMI 38.02 kg/m    Subjective:    Patient ID: [name], female    DOB: 1932-07-29, 82 y.o.   MRN: [id number]  HPI: [name] DakinBessie Whitmyer is a 82 y.o. female presenting on 03/28/2016 for Abdominal Pain (acorss top of stomach, nauseous, feeling of fullness, symptoms seem worse after she eats; ongoing for several months)   HPI Epigastric abdominal pain and heartburn and reflux Patient has been having epigastric abdominal pain and heartburn and reflux is been increased over the past 2 weeks. She says she has been having the sleep upright and a recliner because of this increased heartburn. She says the pain that she's been having is in the epigastric region and is 3 out of 10 and radiates into her back as well. She also feels the burning and belching and nausea and early satiety as well. She denies any blood in her stool or blood in her vomiting. She has not had any vomiting at all. She has seen a gastroenterologist recently and had a scope just last year and they told her that she had esophagitis. She has been on omeprazole 20 mg daily in the morning but feels like that has not made a difference over the past few months. She denies any fevers or chills.  Relevant past medical, surgical, family and social history reviewed and updated as indicated. Interim medical history since our last visit reviewed. Allergies and medications reviewed and updated.  Review of Systems  Constitutional: Negative for chills and fever.  Respiratory: Negative for chest tightness and shortness of breath.   Cardiovascular: Negative for chest pain and leg swelling.  Gastrointestinal: Positive for abdominal distention, abdominal pain, constipation and nausea. Negative for blood in stool, diarrhea and vomiting.  Genitourinary: Negative for difficulty urinating, dysuria, frequency, vaginal bleeding and vaginal discharge.    Musculoskeletal: Negative for back pain and gait problem.  Skin: Negative for rash.  Neurological: Negative for light-headedness and headaches.  Psychiatric/Behavioral: Negative for agitation and behavioral problems.  All other systems reviewed and are negative.   Per HPI unless specifically indicated above      Objective:    BP 138/88   Pulse 92   Temp 97.1 F (36.2 C) (Oral)   Ht 5\' 4"  (1.626 m)   Wt 221 lb 8 oz (100.5 kg)   BMI 38.02 kg/m   Wt Readings from Last 3 Encounters:  03/28/16 221 lb 8 oz (100.5 kg)  03/13/16 218 lb 9.6 oz (99.2 kg)  03/07/16 220 lb 12.8 oz (100.2 kg)    Physical Exam  Constitutional: She is oriented to person, place, and time. She appears well-developed and well-nourished. No distress.  Eyes: Conjunctivae are normal.  Cardiovascular: Normal rate, regular rhythm, normal heart sounds and intact distal pulses.   No murmur heard. Pulmonary/Chest: Effort normal and breath sounds normal. No respiratory distress. She has no wheezes.  Abdominal: Soft. Bowel sounds are normal. She exhibits no distension. There is tenderness (Mild epigastric abdominal discomfort, no guarding or rebound, negative Murphy sign) in the epigastric area. There is no rebound.  Musculoskeletal: Normal range of motion. She exhibits no edema or tenderness.  Neurological: She is alert and oriented to person, place, and time. Coordination normal.  Skin: Skin is warm and dry. No rash noted. She is not diaphoretic.  Psychiatric: She has  a normal mood and affect. Her behavior is normal.  Nursing note and vitals reviewed.     Assessment & Plan:   Problem List Items Addressed This Visit      Digestive   GERD (gastroesophageal reflux disease) - Primary   Relevant Medications   polyethylene glycol powder (GLYCOLAX/MIRALAX) powder   omeprazole (PRILOSEC) 20 MG capsule    Other Visit Diagnoses    Constipation, unspecified constipation type       Relevant Medications    polyethylene glycol powder (GLYCOLAX/MIRALAX) powder       Follow up plan: Return if symptoms worsen or fail to improve.  Counseling provided for all of the vaccine components No orders of the defined types were placed in this encounter.   Arville CareJoshua Ester Mabe, MD [facility] 03/28/2016, 12:29 PM

## 2016-04-09 ENCOUNTER — Ambulatory Visit: Payer: Medicare Other | Admitting: Family Medicine

## 2016-04-10 ENCOUNTER — Ambulatory Visit (INDEPENDENT_AMBULATORY_CARE_PROVIDER_SITE_OTHER): Payer: Medicare Other | Admitting: Family Medicine

## 2016-04-10 ENCOUNTER — Telehealth: Payer: Self-pay | Admitting: Family Medicine

## 2016-04-10 ENCOUNTER — Encounter: Payer: Self-pay | Admitting: Family Medicine

## 2016-04-10 VITALS — BP 151/69 | HR 56 | Temp 97.5°F | Ht 64.0 in | Wt 218.2 lb

## 2016-04-10 DIAGNOSIS — R1013 Epigastric pain: Secondary | ICD-10-CM

## 2016-04-10 DIAGNOSIS — I10 Essential (primary) hypertension: Secondary | ICD-10-CM | POA: Diagnosis not present

## 2016-04-10 NOTE — Progress Notes (Signed)
Subjective:    Patient ID: [name], female    DOB: [birth year], 83 y.o.   MRN: [id number]  HPI 83 year old female with abdominal pain bloating burping. At her last visit here, 2 weeks ago, her omeprazole was doubled. MiraLAX was added. Symptoms have gotten worse. Pain in and bloating are worse post prandial. She is also had some shortness of breath. Also, her blood pressure has been up for the last 2 months and she has headache.  Patient Active Problem List   Diagnosis Date Noted  . Hearing loss 01/06/2015  . Thoracic aorta atherosclerosis (HCC) 03/04/2014  . Degenerative arthritis of thoracic spine 03/04/2014  . Osteoporosis, post-menopausal 08/12/2013  . Metabolic syndrome 06/23/2013  . Anemia, iron deficiency 03/16/2013  . Vitamin D deficiency 03/16/2013  . HTN (hypertension) 03/16/2013  . Hypothyroidism   . Diaphragmatic hernia without mention of obstruction or gangrene   . GERD (gastroesophageal reflux disease)   . Hyperlipidemia   . Prolapse of vaginal walls without mention of uterine prolapse   . Arthritis   . First degree atrioventricular block   . Symptomatic menopausal or female climacteric states   . AF (atrial fibrillation) (HCC)   . Rectal polyp   . Gastric polyp    Outpatient Encounter Prescriptions as of 04/10/2016  Medication Sig  . albuterol (PROVENTIL HFA;VENTOLIN HFA) 108 (90 Base) MCG/ACT inhaler Inhale 2 puffs into the lungs every 4 (four) hours as needed for wheezing or shortness of breath.  . beclomethasone (QVAR) 40 MCG/ACT inhaler Inhale 2 puffs into the lungs 2 (two) times daily.  . calcium-vitamin D (OSCAL WITH D) 500-200 MG-UNIT per tablet Take 1 tablet by mouth daily.  . Cholecalciferol (VITAMIN D3) 2000 UNITS TABS Take 1 tablet by mouth daily.    . cloNIDine (CATAPRES - DOSED IN MG/24 HR) 0.1 mg/24hr patch Place 1 patch (0.1 mg total) onto the skin once a week.  . escitalopram (LEXAPRO) 10 MG tablet Take 1 tablet (10 mg total) by mouth daily.  .  ferrous fumarate-iron polysaccharide complex (TANDEM) 162-115.2 MG CAPS Take 1 capsule by mouth daily with breakfast.  . furosemide (LASIX) 40 MG tablet Take 1 tablet (40 mg total) by mouth daily.  Marland Kitchen KLOR-CON 10 10 MEQ tablet Take 1 tablet (10 mEq total) by mouth daily. Dispense Klor Con only  . levothyroxine (SYNTHROID, LEVOTHROID) 137 MCG tablet Take 1 tablet (137 mcg total) by mouth daily. As directed  . lisinopril (PRINIVIL,ZESTRIL) 40 MG tablet Take 1 tablet (40 mg total) by mouth daily.  Marland Kitchen LORazepam (ATIVAN) 0.5 MG tablet One half tablet to one daily if needed for anxiety  . Multiple Vitamins-Minerals (CENTRUM SILVER) tablet Take 1 tablet by mouth daily.   . mupirocin ointment (BACTROBAN) 2 % Use twice a day on affected areas  . omeprazole (PRILOSEC) 20 MG capsule Take 1 capsule (20 mg total) by mouth 2 (two) times daily before a meal.  . polyethylene glycol powder (GLYCOLAX/MIRALAX) powder Take 17 g by mouth daily as needed.  Marland Kitchen tiZANidine (ZANAFLEX) 2 MG tablet Take 1 tablet (2 mg total) by mouth every 8 (eight) hours as needed for muscle spasms.   Facility-Administered Encounter Medications as of 04/10/2016  Medication  . cyanocobalamin ((VITAMIN B-12)) injection 1,000 mcg      Review of Systems  Constitutional: Negative.   Cardiovascular: Negative.   Gastrointestinal: Positive for abdominal distention and nausea.  Psychiatric/Behavioral: Negative.        Objective:   Physical Exam  Constitutional: She is  oriented to person, place, and time. She appears well-developed and well-nourished.  Cardiovascular: Normal rate.   Pulmonary/Chest: Effort normal and breath sounds normal.  Abdominal: Soft. Bowel sounds are normal.  She has tenderness in the right upper quadrant as well as epigastric area. She does report some pain between her shoulder blades and in her shoulders  Neurological: She is alert and oriented to person, place, and time.   BP (!) 151/69 (BP Location: Left Arm,  Patient Position: Sitting, Cuff Size: Large)   Pulse (!) 56   Temp 97.5 F (36.4 C) (Oral)   Ht 5\' 4"  (1.626 m)   Wt 218 lb 3.2 oz (99 kg)   BMI 37.45 kg/m         Assessment & Plan:  1. Abdominal pain, epigastric Symptoms are very suggestive of gallbladder disease. I do not think MiraLAX is helping her bloating several vascular to discontinue that and substitute Sinemet S. Wilson for a gallbladder ultrasound - US Abdomen Complete; Future  2. Essential hypertension Pressure medicines include lisinopril 40 mg. I would not increase this dose but she is also taking clonidine patch 0.1 mg. I have asked her to double up on this intake 0.2 mg. Will follow  Frederica KusterStephen M Miller MD

## 2016-04-11 ENCOUNTER — Ambulatory Visit (HOSPITAL_COMMUNITY)
Admission: RE | Admit: 2016-04-11 | Discharge: 2016-04-11 | Disposition: A | Discharge status: Home / Self Care | Payer: Medicare Other | Source: Ambulatory Visit | Attending: Family Medicine | Admitting: Family Medicine

## 2016-04-11 DIAGNOSIS — R1013 Epigastric pain: Secondary | ICD-10-CM | POA: Diagnosis present

## 2016-04-11 DIAGNOSIS — K829 Disease of gallbladder, unspecified: Secondary | ICD-10-CM | POA: Insufficient documentation

## 2016-04-11 DIAGNOSIS — J90 Pleural effusion, not elsewhere classified: Secondary | ICD-10-CM | POA: Insufficient documentation

## 2016-04-11 DIAGNOSIS — N281 Cyst of kidney, acquired: Secondary | ICD-10-CM | POA: Diagnosis not present

## 2016-04-11 NOTE — Telephone Encounter (Signed)
Pt aware of appointment date/time 

## 2016-04-13 ENCOUNTER — Telehealth: Payer: Self-pay | Admitting: Family Medicine

## 2016-04-13 DIAGNOSIS — R935 Abnormal findings on diagnostic imaging of other abdominal regions, including retroperitoneum: Secondary | ICD-10-CM

## 2016-04-13 NOTE — Telephone Encounter (Signed)
Pt aware of results and CT ordered

## 2016-04-17 ENCOUNTER — Telehealth: Payer: Self-pay | Admitting: Family Medicine

## 2016-04-20 ENCOUNTER — Ambulatory Visit: Payer: Medicare Other

## 2016-04-20 ENCOUNTER — Other Ambulatory Visit: Payer: Self-pay

## 2016-04-20 DIAGNOSIS — R935 Abnormal findings on diagnostic imaging of other abdominal regions, including retroperitoneum: Secondary | ICD-10-CM

## 2016-04-23 ENCOUNTER — Ambulatory Visit (HOSPITAL_COMMUNITY): Admission: RE | Admit: 2016-04-23 | Payer: Medicare Other | Source: Ambulatory Visit

## 2016-04-23 ENCOUNTER — Ambulatory Visit (INDEPENDENT_AMBULATORY_CARE_PROVIDER_SITE_OTHER): Payer: Medicare Other | Admitting: *Deleted

## 2016-04-23 DIAGNOSIS — E538 Deficiency of other specified B group vitamins: Secondary | ICD-10-CM

## 2016-04-23 NOTE — Progress Notes (Signed)
Vitamin b12 injection given and patient tolerated well.  

## 2016-04-23 NOTE — Patient Instructions (Signed)
Vitamin B12 Deficiency Vitamin B12 deficiency occurs when the body does not have enough vitamin B12. Vitamin B12 is an important vitamin. The body needs vitamin B12:  To make red blood cells.  To make DNA. This is the genetic material inside cells.  To help the nerves work properly so they can carry messages from the brain to the body. Vitamin B12 deficiency can cause various health problems, such as a low red blood cell count (anemia) or nerve damage. What are the causes? This condition may be caused by:  Not eating enough foods that contain vitamin B12.  Not having enough stomach acid and digestive fluids to properly absorb vitamin B12 from the food that you eat.  Certain digestive system diseases that make it hard to absorb vitamin B12. These diseases include Crohn disease, chronic pancreatitis, and cystic fibrosis.  Pernicious anemia. This is a condition in which the body does not make enough of a protein (intrinsic factor), resulting in too few red blood cells.  Having a surgery in which part of the stomach or small intestine is removed.  Taking certain medicines that make it hard for the body to absorb vitamin B12. These medicines include:  Heartburn medicine (antacids and proton pump inhibitors).  An antibiotic medicine called neomycin.  Some medicines that are used to treat diabetes, tuberculosis, gout, or high cholesterol. What increases the risk? The following factors may make you more likely to develop a B12 deficiency:  Being older than age 50.  Eating a vegetarian or vegan diet, especially while you are pregnant.  Eating a poor diet while you are pregnant.  Taking certain drugs.  Having alcoholism. What are the signs or symptoms? In some cases, there are no symptoms of this condition. If the condition leads to anemia or nerve damage, various symptoms can occur, such as:  Weakness.  Fatigue.  Loss of appetite.  Weight loss.  Numbness or tingling in your  hands and feet.  Redness and burning of the tongue.  Confusion or memory problems.  Depression.  Sensory problems, such as color blindness, ringing in the ears, or loss of taste.  Diarrhea or constipation.  Trouble walking. If anemia is severe, symptoms can include:  Shortness of breath.  Dizziness.  Rapid heart rate (tachycardia). How is this diagnosed? This condition may be diagnosed with a blood test to measure the level of vitamin B12 in your blood. You may have other tests to help find the cause of your vitamin B12 deficiency. These tests may include:  A complete blood count (CBC). This is a group of tests that measure certain characteristics of blood cells.  A blood test to measure intrinsic factor.  An endoscopy. In this procedure, a thin tube with a camera on the end is used to look into your stomach or intestines. How is this treated? Treatment for this condition depends on the cause. Common treatment options include:  Changing your eating and drinking habits, such as:  Eating more foods that contain vitamin B12.  Drinking less alcohol or no alcohol.  Taking vitamin B12 supplements. Your health care provider will tell you which dosage is best for you.  Getting vitamin B12 injections. Follow these instructions at home:  Take supplements only as told by your health care provider. Follow the directions carefully.  Get any injections that are prescribed by your health care provider.  Do not miss your appointments.  Eat lots of healthy foods that contain vitamin B12. Ask your health care provider if you   should work with a dietitian. Foods that contain vitamin B12 include:  Meat.  Meat from birds (poultry).  Fish.  Eggs.  Cereal and dairy products that are fortified. This means that vitamin B12 has been added to the food. Check the label on the package to see if the food is fortified.  Do not abuse alcohol.  Keep all follow-up visits as told by your  health care provider. This is important. Contact a health care provider if:  Your symptoms come back. Get help right away if:  You develop shortness of breath.  You have chest pain.  You become dizzy or you lose consciousness. This information is not intended to replace advice given to you by your health care provider. Make sure you discuss any questions you have with your health care provider. Document Released: 06/11/2011 Document Revised: 08/31/2015 Document Reviewed: 08/04/2014 Elsevier Interactive Patient Education  2017 [employer]vier Inc.  

## 2016-04-27 ENCOUNTER — Ambulatory Visit (HOSPITAL_COMMUNITY): Payer: Medicare Other

## 2016-04-30 ENCOUNTER — Ambulatory Visit (HOSPITAL_COMMUNITY)
Admission: RE | Admit: 2016-04-30 | Discharge: 2016-04-30 | Disposition: A | Discharge status: Home / Self Care | Payer: Medicare Other | Source: Ambulatory Visit | Attending: Family Medicine | Admitting: Family Medicine

## 2016-04-30 ENCOUNTER — Ambulatory Visit: Payer: Medicare Other | Admitting: Family Medicine

## 2016-04-30 DIAGNOSIS — I700 Atherosclerosis of aorta: Secondary | ICD-10-CM | POA: Diagnosis not present

## 2016-04-30 DIAGNOSIS — I2510 Atherosclerotic heart disease of native coronary artery without angina pectoris: Secondary | ICD-10-CM | POA: Insufficient documentation

## 2016-04-30 DIAGNOSIS — R935 Abnormal findings on diagnostic imaging of other abdominal regions, including retroperitoneum: Secondary | ICD-10-CM | POA: Diagnosis present

## 2016-04-30 DIAGNOSIS — I517 Cardiomegaly: Secondary | ICD-10-CM | POA: Insufficient documentation

## 2016-04-30 DIAGNOSIS — K449 Diaphragmatic hernia without obstruction or gangrene: Secondary | ICD-10-CM | POA: Insufficient documentation

## 2016-04-30 DIAGNOSIS — I313 Pericardial effusion (noninflammatory): Secondary | ICD-10-CM | POA: Insufficient documentation

## 2016-05-01 ENCOUNTER — Other Ambulatory Visit: Payer: Self-pay | Admitting: *Deleted

## 2016-05-01 ENCOUNTER — Telehealth: Payer: Self-pay | Admitting: Family Medicine

## 2016-05-01 DIAGNOSIS — R1013 Epigastric pain: Secondary | ICD-10-CM

## 2016-05-01 DIAGNOSIS — R935 Abnormal findings on diagnostic imaging of other abdominal regions, including retroperitoneum: Secondary | ICD-10-CM

## 2016-05-04 ENCOUNTER — Telehealth: Payer: Self-pay | Admitting: Family Medicine

## 2016-05-04 ENCOUNTER — Telehealth: Payer: Self-pay | Admitting: Pharmacist

## 2016-05-04 NOTE — Telephone Encounter (Signed)
Called patient (second call) regarding treatment for osteoporosis.  Left message for her to call me to discuss possible treatments

## 2016-05-07 ENCOUNTER — Other Ambulatory Visit: Payer: Self-pay | Admitting: Family Medicine

## 2016-05-07 DIAGNOSIS — I10 Essential (primary) hypertension: Secondary | ICD-10-CM

## 2016-05-08 NOTE — Telephone Encounter (Signed)
Patient decline pharmacotherapy treatment for osteoporosis.  Understands risk of fracture.

## 2016-05-09 ENCOUNTER — Other Ambulatory Visit: Payer: Self-pay | Admitting: *Deleted

## 2016-05-09 DIAGNOSIS — R1011 Right upper quadrant pain: Secondary | ICD-10-CM

## 2016-05-14 ENCOUNTER — Other Ambulatory Visit: Payer: Self-pay | Admitting: *Deleted

## 2016-05-14 DIAGNOSIS — R935 Abnormal findings on diagnostic imaging of other abdominal regions, including retroperitoneum: Secondary | ICD-10-CM

## 2016-05-14 DIAGNOSIS — N83209 Unspecified ovarian cyst, unspecified side: Secondary | ICD-10-CM

## 2016-05-15 ENCOUNTER — Ambulatory Visit (HOSPITAL_COMMUNITY): Admission: RE | Admit: 2016-05-15 | Payer: Medicare Other | Source: Ambulatory Visit

## 2016-05-16 ENCOUNTER — Other Ambulatory Visit (HOSPITAL_COMMUNITY): Payer: Self-pay | Admitting: General Surgery

## 2016-05-16 DIAGNOSIS — G8929 Other chronic pain: Secondary | ICD-10-CM

## 2016-05-16 DIAGNOSIS — R109 Unspecified abdominal pain: Principal | ICD-10-CM

## 2016-05-18 ENCOUNTER — Other Ambulatory Visit: Payer: Self-pay | Admitting: Family Medicine

## 2016-05-18 ENCOUNTER — Ambulatory Visit (HOSPITAL_COMMUNITY)
Admission: RE | Admit: 2016-05-18 | Discharge: 2016-05-18 | Disposition: A | Discharge status: Home / Self Care | Payer: Medicare Other | Source: Ambulatory Visit | Attending: Family Medicine | Admitting: Family Medicine

## 2016-05-18 DIAGNOSIS — R102 Pelvic and perineal pain: Secondary | ICD-10-CM | POA: Diagnosis present

## 2016-05-18 DIAGNOSIS — R935 Abnormal findings on diagnostic imaging of other abdominal regions, including retroperitoneum: Secondary | ICD-10-CM

## 2016-05-18 DIAGNOSIS — N83209 Unspecified ovarian cyst, unspecified side: Secondary | ICD-10-CM

## 2016-05-21 ENCOUNTER — Encounter (HOSPITAL_COMMUNITY): Payer: Self-pay

## 2016-05-21 ENCOUNTER — Ambulatory Visit (HOSPITAL_COMMUNITY)
Admission: RE | Admit: 2016-05-21 | Discharge: 2016-05-21 | Disposition: A | Discharge status: Home / Self Care | Payer: Medicare Other | Source: Ambulatory Visit | Attending: General Surgery | Admitting: General Surgery

## 2016-05-21 DIAGNOSIS — R109 Unspecified abdominal pain: Secondary | ICD-10-CM | POA: Diagnosis present

## 2016-05-21 DIAGNOSIS — G8929 Other chronic pain: Secondary | ICD-10-CM | POA: Diagnosis present

## 2016-05-21 MED ORDER — TECHNETIUM TC 99M MEBROFENIN IV KIT
5.0000 | PACK | Freq: Once | INTRAVENOUS | Status: AC | PRN
  Administered 2016-05-21: 5.4 via INTRAVENOUS

## 2016-05-23 ENCOUNTER — Telehealth: Payer: Self-pay | Admitting: Family Medicine

## 2016-05-23 MED ORDER — OSELTAMIVIR PHOSPHATE 75 MG PO CAPS: 75.0000 mg | ORAL_CAPSULE | Freq: Every day | ORAL | 0 refills | Status: DC

## 2016-05-23 NOTE — Telephone Encounter (Signed)
Tamiflu 75 mg 1 daily 10 days

## 2016-05-23 NOTE — Telephone Encounter (Signed)
What symptoms do you have? Cough, fever, throat irritated. Exposed to flu.   How long have you been sick? Since yesterday  Have you been seen for this problem? no  If your provider decides to give you a prescription, which pharmacy would you like for it to be sent to? [facility] in [place].   Patient informed that this information will be sent to the clinical staff for review and that they should receive a follow up call.

## 2016-05-24 ENCOUNTER — Other Ambulatory Visit: Payer: Self-pay | Admitting: Family

## 2016-05-24 ENCOUNTER — Ambulatory Visit (INDEPENDENT_AMBULATORY_CARE_PROVIDER_SITE_OTHER): Payer: Medicare Other | Admitting: *Deleted

## 2016-05-24 ENCOUNTER — Encounter: Payer: Self-pay | Admitting: Family

## 2016-05-24 ENCOUNTER — Ambulatory Visit (INDEPENDENT_AMBULATORY_CARE_PROVIDER_SITE_OTHER): Payer: Medicare Other | Admitting: Family

## 2016-05-24 ENCOUNTER — Ambulatory Visit (INDEPENDENT_AMBULATORY_CARE_PROVIDER_SITE_OTHER): Payer: Medicare Other

## 2016-05-24 VITALS — BP 146/68 | HR 63 | Temp 101.2°F | Ht 64.0 in | Wt 216.4 lb

## 2016-05-24 DIAGNOSIS — R1084 Generalized abdominal pain: Secondary | ICD-10-CM | POA: Diagnosis not present

## 2016-05-24 DIAGNOSIS — J111 Influenza due to unidentified influenza virus with other respiratory manifestations: Secondary | ICD-10-CM

## 2016-05-24 DIAGNOSIS — R05 Cough: Secondary | ICD-10-CM

## 2016-05-24 DIAGNOSIS — R062 Wheezing: Secondary | ICD-10-CM

## 2016-05-24 DIAGNOSIS — E538 Deficiency of other specified B group vitamins: Secondary | ICD-10-CM

## 2016-05-24 DIAGNOSIS — R059 Cough, unspecified: Secondary | ICD-10-CM

## 2016-05-24 DIAGNOSIS — R509 Fever, unspecified: Secondary | ICD-10-CM | POA: Diagnosis not present

## 2016-05-24 MED ORDER — LEVOFLOXACIN 500 MG PO TABS: 500.0000 mg | ORAL_TABLET | Freq: Every day | ORAL | 0 refills | Status: DC

## 2016-05-24 NOTE — Patient Instructions (Signed)

## 2016-05-24 NOTE — Progress Notes (Signed)
Pt given Vit B12 inj Tolerated well 

## 2016-05-24 NOTE — Addendum Note (Signed)
Addended by: Bearl MulberryUTHERFORD, Almas Rake K on: 05/24/2016 04:51 PM   Modules accepted: Orders

## 2016-05-24 NOTE — Progress Notes (Signed)
   Subjective:    Patient ID: [name] DakinBessie Piper, female    DOB: [birth year], 83 y.o.   MRN: [id number]  Cough  This is a new problem. The current episode started in the past 7 days. The problem has been gradually worsening. The problem occurs every few minutes. The cough is productive of brown sputum. Associated symptoms include chills, ear congestion, ear pain, a fever, headaches, myalgias, nasal congestion, rhinorrhea, a sore throat, shortness of breath and wheezing. The symptoms are aggravated by lying down. She has tried rest (tamiflu) for the symptoms. The treatment provided mild relief.  Headache   Associated symptoms include coughing, ear pain, a fever, rhinorrhea and a sore throat.  Fever   Associated symptoms include coughing, ear pain, headaches, a sore throat and wheezing.      Review of Systems  Constitutional: Positive for chills and fever.  HENT: Positive for ear pain, rhinorrhea and sore throat.   Respiratory: Positive for cough, shortness of breath and wheezing.   Musculoskeletal: Positive for myalgias.  Neurological: Positive for headaches.  All other systems reviewed and are negative.      Objective:   Physical Exam  Constitutional: She is oriented to person, place, and time. She appears well-developed and well-nourished. No distress.  HENT:  Head: Normocephalic and atraumatic.  Right Ear: External ear normal.  Left Ear: External ear normal.  Nose: Mucosal edema and rhinorrhea present.  Mouth/Throat: Posterior oropharyngeal erythema present.  Eyes: Pupils are equal, round, and reactive to light.  Neck: Normal range of motion. Neck supple. No thyromegaly present.  Cardiovascular: Normal rate, regular rhythm, normal heart sounds and intact distal pulses.   No murmur heard. Pulmonary/Chest: Effort normal. No respiratory distress. She has wheezes in the right middle field and the left middle field.  Abdominal: Soft. Bowel sounds are normal. She exhibits no distension.  There is no tenderness.  Neurological: She is alert and oriented to person, place, and time.  Skin: Skin is warm and dry.  Psychiatric: She has a normal mood and affect. Her behavior is normal. Judgment and thought content normal.  Vitals reviewed.     BP (!) 146/68   Pulse 63   Temp (!) 101.2 F (38.4 C) (Oral)   Ht 5\' 4"  (1.626 m)   Wt 216 lb 6.4 oz (98.2 kg)   BMI 37.14 kg/m      Assessment & Plan:  1. Influenza -Continue Tamiflu Force fluids Rest Good hand hygiene - DG Chest 2 View; Future  2. Cough  - DG Chest 2 View; Future  3. Fever, unspecified fever cause - levofloxacin (LEVAQUIN) 500 MG tablet; Take 1 tablet (500 mg total) by mouth daily.  Dispense: 7 tablet; Refill: 0 - DG Chest 2 View; Future  4. Wheezing - levofloxacin (LEVAQUIN) 500 MG tablet; Take 1 tablet (500 mg total) by mouth daily.  Dispense: 7 tablet; Refill: 0 - DG Chest 2 View; Future  Will start on Levaquin  Force fluids Rest RTO prn   Jannifer Rodneyhristy Jeremaine Maraj, FNP

## 2016-05-25 LAB — CBC WITH DIFFERENTIAL/PLATELET
Basophils Absolute: 0 10*3/uL (ref 0.0–0.2)
Basos: 0 %
EOS (ABSOLUTE): 0.1 10*3/uL (ref 0.0–0.4)
EOS: 3 %
HEMATOCRIT: 34.8 % (ref 34.0–46.6)
Hemoglobin: 11.2 g/dL (ref 11.1–15.9)
Immature Grans (Abs): 0 10*3/uL (ref 0.0–0.1)
Immature Granulocytes: 0 %
LYMPHS ABS: 0.4 10*3/uL — AB (ref 0.7–3.1)
Lymphs: 10 %
MCH: 29.4 pg (ref 26.6–33.0)
MCHC: 32.2 g/dL (ref 31.5–35.7)
MCV: 91 fL (ref 79–97)
MONOS ABS: 0.5 10*3/uL (ref 0.1–0.9)
Monocytes: 10 %
Neutrophils Absolute: 3.5 10*3/uL (ref 1.4–7.0)
Neutrophils: 77 %
Platelets: 180 10*3/uL (ref 150–379)
RBC: 3.81 x10E6/uL (ref 3.77–5.28)
RDW: 15.2 % (ref 12.3–15.4)
WBC: 4.6 10*3/uL (ref 3.4–10.8)

## 2016-05-25 LAB — CMP14+EGFR
A/G RATIO: 1.7 (ref 1.2–2.2)
ALBUMIN: 4.3 g/dL (ref 3.5–4.7)
ALK PHOS: 161 IU/L — AB (ref 39–117)
ALT: 16 IU/L (ref 0–32)
AST: 25 IU/L (ref 0–40)
BILIRUBIN TOTAL: 1.1 mg/dL (ref 0.0–1.2)
BUN / CREAT RATIO: 18 (ref 12–28)
BUN: 17 mg/dL (ref 8–27)
CHLORIDE: 98 mmol/L (ref 96–106)
CO2: 28 mmol/L (ref 18–29)
CREATININE: 0.95 mg/dL (ref 0.57–1.00)
Calcium: 9.3 mg/dL (ref 8.7–10.3)
GFR calc Af Amer: 65 (ref >59)
GFR calc non Af Amer: 56 — ABNORMAL LOW (ref >59)
GLOBULIN, TOTAL: 2.5 (ref 1.5–4.5)
Glucose: 118 mg/dL — ABNORMAL HIGH (ref 65–99)
POTASSIUM: 3.7 mmol/L (ref 3.5–5.2)
SODIUM: 144 mmol/L (ref 134–144)
Total Protein: 6.8 g/dL (ref 6.0–8.5)

## 2016-05-26 ENCOUNTER — Ambulatory Visit (INDEPENDENT_AMBULATORY_CARE_PROVIDER_SITE_OTHER): Payer: Medicare Other | Admitting: Family Medicine

## 2016-05-26 ENCOUNTER — Encounter: Payer: Self-pay | Admitting: Family Medicine

## 2016-05-26 VITALS — BP 146/77 | HR 68 | Temp 97.4°F | Resp 20 | Ht 64.0 in | Wt 217.2 lb

## 2016-05-26 DIAGNOSIS — J189 Pneumonia, unspecified organism: Secondary | ICD-10-CM

## 2016-05-26 MED ORDER — METHYLPREDNISOLONE ACETATE 80 MG/ML IJ SUSP
80.0000 mg | Freq: Once | INTRAMUSCULAR | Status: AC
  Administered 2016-05-26: 80 mg via INTRAMUSCULAR

## 2016-05-26 MED ORDER — ALBUTEROL SULFATE HFA 108 (90 BASE) MCG/ACT IN AERS: 2.0000 | INHALATION_SPRAY | RESPIRATORY_TRACT | 5 refills | Status: DC | PRN

## 2016-05-26 NOTE — Progress Notes (Signed)
BP (!) 146/77 (BP Location: Left Arm, Patient Position: Sitting, Cuff Size: Normal)   Pulse 68   Temp 97.4 F (36.3 C) (Oral)   Resp 20   Ht 5\' 4"  (1.626 m)   Wt 217 lb 3.2 oz (98.5 kg)   SpO2 92%   BMI 37.28 kg/m    Subjective:    Patient ID: [name], female    DOB: 1932/05/10, 83 y.o.   MRN: [id number]  HPI: [name] is a 83 y.o. female presenting on 05/26/2016 for URI (head and chest congestion, sneezing, currently taking Tamiflu and Levaquin)   HPI Head congestion and sneezing Patient was diagnosed with bilateral pneumonia and influenza. She has been on Tamiflu and Levaquin for the past 2 days. She felt like her cough was getting better until last night she was having a lot of head congestion and sneezing and cough. She came into the office and was more short of breath with ambulation that she normally is and her oxygen desaturated down to 84% but came up quickly when she was sitting to 92%. She denies any more significant short of breath at rest then she has had previously but definitely with ambulation she is having more. She denies any more fevers or chills the past couple days. She has been feeling well otherwise except for last night she is feeling a lot worse. She has albuterol inhalers and steroid inhaler but is not using them because she has trouble timing them.  Relevant past medical, surgical, family and social history reviewed and updated as indicated. Interim medical history since our last visit reviewed. Allergies and medications reviewed and updated.  Review of Systems  Constitutional: Negative for chills and fever.  HENT: Positive for congestion, postnasal drip, rhinorrhea, sinus pressure, sneezing and sore throat. Negative for ear discharge and ear pain.   Eyes: Negative for pain, redness and visual disturbance.  Respiratory: Positive for cough and shortness of breath. Negative for chest tightness and wheezing.   Cardiovascular: Negative for chest pain and  leg swelling.  Genitourinary: Negative for difficulty urinating and dysuria.  Musculoskeletal: Negative for back pain and gait problem.  Skin: Negative for rash.  Neurological: Negative for light-headedness and headaches.  Psychiatric/Behavioral: Negative for agitation and behavioral problems.  All other systems reviewed and are negative.   Per HPI unless specifically indicated above     Objective:    BP (!) 146/77 (BP Location: Left Arm, Patient Position: Sitting, Cuff Size: Normal)   Pulse 68   Temp 97.4 F (36.3 C) (Oral)   Resp 20   Ht 5\' 4"  (1.626 m)   Wt 217 lb 3.2 oz (98.5 kg)   SpO2 92%   BMI 37.28 kg/m   Wt Readings from Last 3 Encounters:  05/26/16 217 lb 3.2 oz (98.5 kg)  05/24/16 216 lb 6.4 oz (98.2 kg)  04/10/16 218 lb 3.2 oz (99 kg)    Physical Exam  Constitutional: She is oriented to person, place, and time. She appears well-developed and well-nourished. No distress.  HENT:  Right Ear: Tympanic membrane, external ear and ear canal normal.  Left Ear: Tympanic membrane, external ear and ear canal normal.  Nose: Mucosal edema and rhinorrhea present. No epistaxis. Right sinus exhibits no maxillary sinus tenderness and no frontal sinus tenderness. Left sinus exhibits no maxillary sinus tenderness and no frontal sinus tenderness.  Mouth/Throat: Uvula is midline and mucous membranes are normal. Posterior oropharyngeal edema and posterior oropharyngeal erythema present. No oropharyngeal exudate or tonsillar abscesses.  Eyes: Conjunctivae are normal.  Neck: Neck supple.  Cardiovascular: Normal rate, regular rhythm, normal heart sounds and intact distal pulses.   No murmur heard. Pulmonary/Chest: Effort normal. No respiratory distress. She has no wheezes. She has rales (Left lower lobe).  Musculoskeletal: Normal range of motion. She exhibits no edema or tenderness.  Lymphadenopathy:    She has no cervical adenopathy.  Neurological: She is alert and oriented to person,  place, and time. Coordination normal.  Skin: Skin is warm and dry. No rash noted. She is not diaphoretic.  Psychiatric: She has a normal mood and affect. Her behavior is normal.  Vitals reviewed.     Assessment & Plan:   Problem List Items Addressed This Visit    None    Visit Diagnoses    Community acquired pneumonia, unspecified laterality    -  Primary   Patchy bronchopneumonia both sides, is on Levaquin and Tamiflu, will give prednisone and spacer for her albuterol inhaler   Relevant Medications   methylPREDNISolone acetate (DEPO-MEDROL) injection 80 mg (Start on 05/26/2016  9:30 AM)   albuterol (PROVENTIL HFA;VENTOLIN HFA) 108 (90 Base) MCG/ACT inhaler     We also gave her a spacer so that she could use the albuterol inhaler better gave her samples for steroid inhaler.   Follow up plan: Return in about 1 week (around 06/02/2016), or if symptoms worsen or fail to improve, for Follow-up breathing.  Counseling provided for all of the vaccine components No orders of the defined types were placed in this encounter.   Arville CareJoshua Dettinger, MD [facility] 05/26/2016, 9:15 AM

## 2016-05-31 ENCOUNTER — Encounter: Payer: Self-pay | Admitting: Cardiology

## 2016-06-05 ENCOUNTER — Telehealth: Payer: Self-pay | Admitting: Family Medicine

## 2016-06-05 NOTE — Telephone Encounter (Signed)
Pt notified of results Verbalizes understanding 

## 2016-06-05 NOTE — Progress Notes (Signed)
HPI The patient presents for evaluation of syncope. This was probably related to orthostasis. She has atrial fibrillation but has refused anticoagulation. She does not notice palpitations.  Her biggest complaint previously was that she reported that she's had some issue with vapors in her apartment. The cause of this could not be determined. She says she has subsequently developed chemical allergies.  She has since moved to another apartment.  She also thinks that she developed an infection after a pedicure and that this affected her GI system.  She has many somatic complaints.  However, she does not have any symptoms suggestive of chest, neck or arm pain.  She has some chronic dyspnea.  She has lots of "jittery" feelings but does not describe palpitations.  She has not had any recent syncope.  She has recently had abdominal pain and has had a significant work up and is being followed by GI.      Allergies  Allergen Reactions  . Aspirin Nausea Only  . Codeine Nausea Only  . Sulfa Antibiotics   . Vicodin [Hydrocodone-Acetaminophen] Nausea Only  . Warfarin Sodium Other (See Comments)    Tingling all over    Current Outpatient Prescriptions  Medication Sig Dispense Refill  . albuterol (PROVENTIL HFA;VENTOLIN HFA) 108 (90 Base) MCG/ACT inhaler Inhale 2 puffs into the lungs every 4 (four) hours as needed for wheezing or shortness of breath. 8 g 5  . calcium-vitamin D (OSCAL WITH D) 500-200 MG-UNIT per tablet Take 1 tablet by mouth daily.    . Cholecalciferol (VITAMIN D3) 2000 UNITS TABS Take 1 tablet by mouth daily.      Marland Kitchen escitalopram (LEXAPRO) 10 MG tablet TAKE ONE TABLET BY MOUTH ONCE DAILY 30 tablet 1  . ferrous fumarate-iron polysaccharide complex (TANDEM) 162-115.2 MG CAPS Take 1 capsule by mouth daily with breakfast.    . furosemide (LASIX) 40 MG tablet TAKE ONE TABLET BY MOUTH ONCE DAILY 90 tablet 0  . KLOR-CON 10 10 MEQ tablet Take 1 tablet (10 mEq total) by mouth daily. Dispense Klor  Con only 90 tablet 3  . levothyroxine (SYNTHROID, LEVOTHROID) 137 MCG tablet Take 1 tablet (137 mcg total) by mouth daily. As directed 90 tablet 2  . lisinopril (PRINIVIL,ZESTRIL) 40 MG tablet TAKE ONE TABLET BY MOUTH ONCE DAILY 90 tablet 0  . LORazepam (ATIVAN) 0.5 MG tablet One half tablet to one daily if needed for anxiety 30 tablet 1  . Multiple Vitamins-Minerals (CENTRUM SILVER) tablet Take 1 tablet by mouth daily.     Marland Kitchen omeprazole (PRILOSEC) 20 MG capsule Take 1 capsule (20 mg total) by mouth 2 (two) times daily before a meal. 60 capsule 2  . polyethylene glycol powder (GLYCOLAX/MIRALAX) powder Take 17 g by mouth daily as needed. 3350 g 1  . apixaban (ELIQUIS) 5 MG TABS tablet Take 1 tablet (5 mg total) by mouth 2 (two) times daily. 60 tablet 11  . cloNIDine (CATAPRES - DOSED IN MG/24 HR) 0.1 mg/24hr patch Place 1 patch (0.1 mg total) onto the skin once a week. (Patient not taking: Reported on 06/06/2016) 4 patch 12   Current Facility-Administered Medications  Medication Dose Route Frequency Provider Last Rate Last Dose  . cyanocobalamin ((VITAMIN B-12)) injection 1,000 mcg  1,000 mcg Intramuscular Q30 days Ernestina Penna, MD   1,000 mcg at 05/24/16 1109    Past Medical History:  Diagnosis Date  . AF (atrial fibrillation) (HCC)   . Arthritis   . Diaphragmatic hernia without mention of obstruction  or gangrene   . Esophagitis   . First degree atrioventricular block   . Gastric polyp   . GERD (gastroesophageal reflux disease)   . Heme positive stool   . Hyperlipidemia   . Hypertension   . Hypothyroid   . Prolapse of vaginal walls without mention of uterine prolapse   . Rectal polyp     Past Surgical History:  Procedure Laterality Date  . DILATION AND CURETTAGE OF UTERUS    . DILATION AND CURETTAGE OF UTERUS    . KIDNEY STONE SURGERY      ROS:  Positive for dizziness, burning eyes, burning mouth, decreased energy, sleeplessness 60.  Otherwise as stated in the HPI and negative  for all other systems.  PHYSICAL EXAM BP 136/62   Pulse 60   Ht 5\' 4"  (1.626 m)   Wt 201 lb (91.2 kg)   BMI 34.50 kg/m  GENERAL:  Well appearing NECK:  No jugular venous distention, waveform within normal limits, carotid upstroke brisk and symmetric, no bruits, no thyromegaly LUNGS:  Clear to auscultation bilaterally HEART:  PMI not displaced or sustained,S1 and S2 within normal limits, no S3, no clicks, no rubs, apical holosystolic murmur 2/6, no diastolic murmurs, irregular ABD:  Flat, positive bowel sounds normal in frequency in pitch, no bruits, no rebound, no guarding, no midline pulsatile mass, no hepatomegaly, no splenomegaly EXT:  2 plus pulses throughout, mild edema, no cyanosis no clubbing   EKG:    Atrial fibrillation, rate 60,  poor anterior R wave progression, left axis deviation, left anterior fascicular block, bifid T with QT prolongation.  06/06/2016   ASSESSMENT AND PLAN   ATRIAL FIBRILLATION:  She has a CHA2DS2 - VASc score of 4 with a risk of stroke of 4%.  Today she seems to agree to take anticoagulation.  She says that she cannot take warfarin because it makes her "tingley."  She indicates she will try Eliquis although cost might be an issue.  She will stop ASA.  She understands the risks benefits of anticoagulation and we have had a long discussion about this.  Her rate is slow.  I am going to check a 24 hour Holter although she is not having any symptoms.    SYNCOPE:  She has had no further episodes of this. No further workup is planned.    MITRAL REGURGITATION:   She had mild MR in 03/2015.  No change in therapy or further imaging is indicated.   HTN:  The blood pressure is at target. No change in medications is indicated. We will continue with therapeutic lifestyle changes (TLC).

## 2016-06-06 ENCOUNTER — Ambulatory Visit (INDEPENDENT_AMBULATORY_CARE_PROVIDER_SITE_OTHER): Payer: Medicare Other

## 2016-06-06 ENCOUNTER — Ambulatory Visit (INDEPENDENT_AMBULATORY_CARE_PROVIDER_SITE_OTHER): Payer: Medicare Other | Admitting: Cardiology

## 2016-06-06 ENCOUNTER — Encounter: Payer: Self-pay | Admitting: Cardiology

## 2016-06-06 VITALS — BP 136/62 | HR 60 | Ht 64.0 in | Wt 201.0 lb

## 2016-06-06 DIAGNOSIS — I480 Paroxysmal atrial fibrillation: Secondary | ICD-10-CM | POA: Diagnosis not present

## 2016-06-06 DIAGNOSIS — I482 Chronic atrial fibrillation: Secondary | ICD-10-CM | POA: Diagnosis not present

## 2016-06-06 DIAGNOSIS — I4821 Permanent atrial fibrillation: Secondary | ICD-10-CM

## 2016-06-06 MED ORDER — APIXABAN 5 MG PO TABS: 5.0000 mg | ORAL_TABLET | Freq: Two times a day (BID) | ORAL | 11 refills | Status: DC

## 2016-06-06 NOTE — Patient Instructions (Signed)
Medication Instructions:  Please stop ASA and start Eliquis 5 mg twice a day. Continue all other medications as listed.  Testing/Procedures: Your physician has recommended that you wear a holter monitor. Holter monitors are medical devices that record the heart's electrical activity. Doctors most often use these monitors to diagnose arrhythmias. Arrhythmias are problems with the speed or rhythm of the heartbeat. The monitor is a small, portable device. You can wear one while you do your normal daily activities. This is usually used to diagnose what is causing palpitations/syncope (passing out).  Follow-Up: Follow up in 6 months with Dr. Antoine PocheHochrein.  You will receive a letter in the mail 2 months before you are due.  Please call us when you receive this letter to schedule your follow up appointment.   If you need a refill on your cardiac medications before your next appointment, please call your pharmacy.  Thank you for choosing [facility]!!

## 2016-06-14 ENCOUNTER — Other Ambulatory Visit: Payer: Self-pay | Admitting: *Deleted

## 2016-06-14 ENCOUNTER — Telehealth: Payer: Self-pay | Admitting: *Deleted

## 2016-06-14 ENCOUNTER — Ambulatory Visit (INDEPENDENT_AMBULATORY_CARE_PROVIDER_SITE_OTHER): Payer: Medicare Other | Admitting: Pediatrics

## 2016-06-14 ENCOUNTER — Encounter: Payer: Self-pay | Admitting: Pediatrics

## 2016-06-14 VITALS — BP 132/75 | HR 54 | Temp 97.9°F | Ht 64.0 in | Wt 199.6 lb

## 2016-06-14 DIAGNOSIS — R739 Hyperglycemia, unspecified: Secondary | ICD-10-CM | POA: Diagnosis not present

## 2016-06-14 DIAGNOSIS — R51 Headache: Secondary | ICD-10-CM

## 2016-06-14 DIAGNOSIS — R519 Headache, unspecified: Secondary | ICD-10-CM

## 2016-06-14 DIAGNOSIS — I480 Paroxysmal atrial fibrillation: Secondary | ICD-10-CM

## 2016-06-14 DIAGNOSIS — I10 Essential (primary) hypertension: Secondary | ICD-10-CM

## 2016-06-14 NOTE — Progress Notes (Signed)
  Subjective:   Patient ID: [name], female    DOB: 10/13/1932, 83 y.o.   MRN: [id number] CC: Headache (Since starting Eliquis)  HPI: [name] is a 83 y.o. female presenting for Headache (Since starting Eliquis)  Started having headaches after starting the eliquis Says sometimes would have HA early in the morning before, felt different Now having pain sometimes on R side, sometimes goes up neck, sometimes top of head hurting, sometimes feeling hot Has been hurting continuously since it started the eliquis She self-discontinued two days ago, headache has been improved since then No bleeding No weakness one side of body vs other  Says her nerves have been worse recently Will get very nervous about small things, start shaking more than usual Not happening every day  Has lost apprx 20 lbs over past few weeks Says the medicines for her stomach have been helping with pain a lot and thinks since starting that is when she start the weight loss Appetite has been fine stooling is normal  Relevant past medical, surgical, family and social history reviewed. Allergies and medications reviewed and updated. History  Smoking Status  . Never Smoker  Smokeless Tobacco  . Never Used   ROS: Per HPI   Objective:    BP 132/75   Pulse (!) 54   Temp 97.9 F (36.6 C) (Oral)   Ht _0  (1.626 m)   Wt 199 lb 9.6 oz (90.5 kg)   BMI 34.26 kg/m   Wt Readings from Last 3 Encounters:  06/14/16 199 lb 9.6 oz (90.5 kg)  06/06/16 201 lb (91.2 kg)  05/26/16 217 lb 3.2 oz (98.5 kg)    Gen: NAD, alert, cooperative with exam, NCAT EYES: EOMI, no conjunctival injection, or no icterus ENT:  TMs pearly gray b/l, OP without erythema LYMPH: no cervical LAD CV: NRRR, normal S1/S2, no murmur, DP pulse 2+ b/l Resp: CTABL, no wheezes, normal WOB Abd: +BS, soft, NTND. no guarding or organomegaly Ext: trace pitting edema ankles b/l, R slightly > L, warm Neuro: Alert and oriented, sensation intact  b/l Skin: red-purple discolored areas b/l LE Minimal tenderness over skin  No redness, heat  Assessment & Plan:  Randilyn was seen today for headache.  Diagnoses and all orders for this visit:  Nonintractable headache, unspecified chronicity pattern, unspecified headache type Pt thinks due to eliquis Improved since stopping the medication two days ago No other new medications -     BMP8+EGFR  HTN BP well controlled now Cont current meds Cont to check at home  A fib Was on ASA prior to eliquis, has discussed start of anticoagulant multiple times for CHA2DS2-VASc of 4 Is not interested in restart eliquis or change to xarelto Will restart her ASA Will continue to discuss  Weight loss 20 lb loss in 3 weeks She attributes to improved stomach symptoms RTC 1 week for recheck HA and weight  Follow up plan: Return in about 1 week (around 06/21/2016). for HA, weight loss Assunta Found, MD [place]

## 2016-06-14 NOTE — Telephone Encounter (Signed)
Left message for patient to call back.  Patient was to have labs drawn at appt today 03/15 and did not stop by.  Patient will need to come in and have labs drawn or can wait until her follow up in 1 week.

## 2016-06-16 ENCOUNTER — Other Ambulatory Visit (INDEPENDENT_AMBULATORY_CARE_PROVIDER_SITE_OTHER): Payer: Medicare Other

## 2016-06-17 LAB — BMP8+EGFR
BUN/Creatinine Ratio: 21 (ref 12–28)
BUN: 21 mg/dL (ref 8–27)
CALCIUM: 9.2 mg/dL (ref 8.7–10.3)
CO2: 28 mmol/L (ref 18–29)
Chloride: 96 mmol/L (ref 96–106)
Creatinine, Ser: 1.02 mg/dL — ABNORMAL HIGH (ref 0.57–1.00)
GFR calc Af Amer: 59 mL/min/{1.73_m2} — ABNORMAL LOW (ref >59)
GFR calc non Af Amer: 51 mL/min/{1.73_m2} — ABNORMAL LOW (ref >59)
GLUCOSE: 93 mg/dL (ref 65–99)
Potassium: 4.1 mmol/L (ref 3.5–5.2)
Sodium: 140 mmol/L (ref 134–144)

## 2016-06-18 ENCOUNTER — Encounter: Payer: Self-pay | Admitting: Family Medicine

## 2016-06-18 LAB — BAYER DCA HB A1C WAIVED: HB A1C (BAYER DCA - WAIVED): 6.2 % (ref <7.0)

## 2016-06-22 ENCOUNTER — Ambulatory Visit: Payer: Medicare Other

## 2016-06-22 ENCOUNTER — Ambulatory Visit (INDEPENDENT_AMBULATORY_CARE_PROVIDER_SITE_OTHER): Payer: Medicare Other

## 2016-06-22 ENCOUNTER — Encounter: Payer: Self-pay | Admitting: Pediatrics

## 2016-06-22 ENCOUNTER — Ambulatory Visit (INDEPENDENT_AMBULATORY_CARE_PROVIDER_SITE_OTHER): Payer: Medicare Other | Admitting: Pediatrics

## 2016-06-22 VITALS — BP 120/63 | HR 55 | Temp 97.3°F | Ht 64.0 in | Wt 199.2 lb

## 2016-06-22 DIAGNOSIS — E538 Deficiency of other specified B group vitamins: Secondary | ICD-10-CM

## 2016-06-22 DIAGNOSIS — R519 Headache, unspecified: Secondary | ICD-10-CM

## 2016-06-22 DIAGNOSIS — R51 Headache: Secondary | ICD-10-CM | POA: Diagnosis not present

## 2016-06-22 DIAGNOSIS — M549 Dorsalgia, unspecified: Secondary | ICD-10-CM | POA: Diagnosis not present

## 2016-06-22 DIAGNOSIS — I10 Essential (primary) hypertension: Secondary | ICD-10-CM | POA: Diagnosis not present

## 2016-06-22 NOTE — Progress Notes (Signed)
  Subjective:   Patient ID: [name], female    DOB: 01/24/1933, 83 y.o.   MRN: [id number] CC: Follow-up (1 week, headache back pain)  HPI: [name] is a 83 y.o. female presenting for Follow-up (1 week, headache back pain)  Headache is still present off and on, every once in a while will feel something "shoot through" her head Has a constant dull headache Tylenol helps some Comes on several times a day Not one place that the pain stays, moves everywhere, the top of her  Ears hurt regularly off and on No fevers  Has had back pain for about a week Doesn't remember any injury Larey SeatFell in Nov 2016, still with occasional R side pain since then Hurts on her right side sometimes when she takes a deep breath Thinks it has been there off and on for months  Middle of her back when she moves in certain ways hurts  Relevant past medical, surgical, family and social history reviewed. Allergies and medications reviewed and updated. History  Smoking Status  . Never Smoker  Smokeless Tobacco  . Never Used   ROS: Per HPI   Objective:    BP 120/63   Pulse (!) 55   Temp 97.3 F (36.3 C) (Oral)   Ht 5\' 4"  (1.626 m)   Wt 199 lb 3.2 oz (90.4 kg)   BMI 34.19 kg/m   Wt Readings from Last 3 Encounters:  06/22/16 199 lb 3.2 oz (90.4 kg)  06/14/16 199 lb 9.6 oz (90.5 kg)  06/06/16 201 lb (91.2 kg)    Gen: NAD, alert, cooperative with exam, NCAT EYES: EOMI, no conjunctival injection, or no icterus ENT:  TMs pearly gray b/l, OP without erythema LYMPH: no cervical LAD CV: slightly bradycardic, normal S1/S2, soft II/VI systolic ejection murmur RUSB, distal pulses 2+ b/l Resp: CTABL, no wheezes, normal WOB Abd: +BS, soft, NTND. no guarding or organomegaly Ext: No edema, warm Neuro: Alert and oriented, strength equal 5/5 b/l hand grip, elbow ext/flex, quad/hamstring. Sensation intact. Symmetric face MSK: ttp midline over upper lumbar spine, minimal ttp paraspinal muscles  Assessment &  Plan:  [name] was seen today for follow-up multiple complaints.  Diagnoses and all orders for this visit:  Nonintractable headache, unspecified chronicity pattern, unspecified headache type Headache slightly improved but still present daily,  BPs well controlled, pt asks for referral to neurology for further evaluation  OK to continue tylenol prn for headache, avoid using more than twice a week for rebound HA -     Ambulatory referral to Neurology  Acute midline back pain, unspecified back location ttp over spine, no known injury, last fall was over a year ago  -     DG Lumbar Spine 2-3 Views; Future  HTN Taking meds daily Well controlled today, cont  Follow up plan: Return in about 8 weeks (around 08/17/2016). Rex Krasarol Brindley Madarang, MD [facility]

## 2016-06-25 ENCOUNTER — Encounter: Payer: Self-pay | Admitting: Neurology

## 2016-06-25 ENCOUNTER — Telehealth: Payer: Self-pay | Admitting: Cardiology

## 2016-06-25 NOTE — Telephone Encounter (Signed)
New message   Pt is returning call. She isn't sure what it's about.

## 2016-06-25 NOTE — Telephone Encounter (Signed)
Nya spoke to patient regarding holter results.

## 2016-06-26 ENCOUNTER — Ambulatory Visit: Payer: Medicare Other | Admitting: Family Medicine

## 2016-07-24 ENCOUNTER — Ambulatory Visit (INDEPENDENT_AMBULATORY_CARE_PROVIDER_SITE_OTHER): Payer: Medicare Other | Admitting: *Deleted

## 2016-07-24 DIAGNOSIS — E538 Deficiency of other specified B group vitamins: Secondary | ICD-10-CM | POA: Diagnosis not present

## 2016-07-24 NOTE — Progress Notes (Signed)
Pt given Vit B12 inj Tolerated well 

## 2016-07-27 ENCOUNTER — Encounter: Payer: Self-pay | Admitting: Neurology

## 2016-07-27 ENCOUNTER — Ambulatory Visit (INDEPENDENT_AMBULATORY_CARE_PROVIDER_SITE_OTHER): Payer: Medicare Other | Admitting: Neurology

## 2016-07-27 VITALS — BP 136/82 | HR 56 | Temp 98.1°F | Resp 16 | Ht 63.5 in | Wt 209.1 lb

## 2016-07-27 DIAGNOSIS — G250 Essential tremor: Secondary | ICD-10-CM

## 2016-07-27 DIAGNOSIS — G4485 Primary stabbing headache: Secondary | ICD-10-CM

## 2016-07-27 DIAGNOSIS — G44209 Tension-type headache, unspecified, not intractable: Secondary | ICD-10-CM | POA: Diagnosis not present

## 2016-07-27 MED ORDER — GABAPENTIN 100 MG PO CAPS: ORAL_CAPSULE | ORAL | 0 refills | Status: DC

## 2016-07-27 NOTE — Progress Notes (Signed)
NEUROLOGY CONSULTATION NOTE  [name] MRN: [id number] DOB: [birth year]  Referring provider: Dr. Oswaldo Done Primary care provider: Dr. Christell Constant  Reason for consult:  Headache, tremor  HISTORY OF PRESENT ILLNESS: [name] is an 83 year old female with atrial fibrillation, hypothyroidism and hypertension who presents for headache and tremor.  She is accompanied by her daughter who supplements history.    Headache: Onset:  March 2018.  It has gotten a little better. Location:  Dull pain on top of head; stabbing pain various Quality:  constant dull pain; also paroxysmal stabbing pain Intensity:  constant dull pain 4/10; shooting pain 7-8/10 Aura:  no Prodrome:  no Postdrome:  no Associated symptoms:  No nausea, vomiting, photophobia, phonophobia, visual disturbance, focal numbness or weakness.  She has not had any new worse headache of her life, waking up from sleep Duration:  dull pain constant; shooting pain seconds Frequency:  dull pain constant; shooting pain several times daily Frequency of abortive medication: less than once a week Triggers/exacerbating factors:  At first, she thought it was side effect of medication, since it correlated with initiation of Eliquis.  Stress may make it worse. Relieving factors:  no Activity:  functions  Past NSAIDS:  Ibuprofen (cannot take due to stomach upset) Past analgesics:  no Past abortive triptans:  no Past muscle relaxants:  no Past anti-emetic:  no Past antihypertensive medications:  no Past antidepressant medications:  no Past anticonvulsant medications:  no Past vitamins/Herbal/Supplements:  no Past antihistamines/decongestants:  no Other past therapies:  no  Current NSAIDS:  no Current analgesics:  Tylenol Current triptans:  no Current anti-emetic:  no Current muscle relaxants:  no Current anti-anxiolytic:  lorazepam Current sleep aide:  no Current Antihypertensive medications:  Lisinopril, furosemide,  clonidine Current Antidepressant medications:  escitalopram  Current Anticonvulsant medications:  no Current Vitamins/Herbal/Supplements:  no Current Antihistamines/Decongestants:  Alelgra Other therapy:  no  Alcohol:  no Smoker:  no Depression/anxiety:  some Family history of headache:  No  TREMOR: She has had tremor in the hands for at least 10 years and has gradually gotten worse.  It started in the left hand and then moved to the right hand.  She is fine at rest.  She notices it when she uses her hand and is disruptive when she writes, uses utensils or holds a book.  She is able to dress without difficulty.  Tremor does not run in the family  06/16/16 LABS: BMP with Na 140, K 4.1, Cl 96, CO2 28, glucose 93, BUN 21, Cr 1.02 05/24/16 LABS: CBC with WBC 4.6, HGB 11.2, HCT 34.8, PLT 180; Hepatic panel with total bili 1.1, ALP 161, AST 25 and ALT 16.  Elevated ALP  PAST MEDICAL HISTORY: Past Medical History:  Diagnosis Date  . AF (atrial fibrillation) (HCC)   . Arthritis   . Diaphragmatic hernia without mention of obstruction or gangrene   . Esophagitis   . First degree atrioventricular block   . Gastric polyp   . GERD (gastroesophageal reflux disease)   . Heme positive stool   . Hyperlipidemia   . Hypertension   . Hypothyroid   . Prolapse of vaginal walls without mention of uterine prolapse   . Rectal polyp     PAST SURGICAL HISTORY: Past Surgical History:  Procedure Laterality Date  . DILATION AND CURETTAGE OF UTERUS    . DILATION AND CURETTAGE OF UTERUS    . KIDNEY STONE SURGERY      MEDICATIONS: Current Outpatient Prescriptions on  File Prior to Visit  Medication Sig Dispense Refill  . albuterol (PROVENTIL HFA;VENTOLIN HFA) 108 (90 Base) MCG/ACT inhaler Inhale 2 puffs into the lungs every 4 (four) hours as needed for wheezing or shortness of breath. 8 g 5  . aspirin 81 MG chewable tablet Chew 81 mg by mouth daily.    . calcium-vitamin D (OSCAL WITH D) 500-200  MG-UNIT per tablet Take 1 tablet by mouth daily.    . Cholecalciferol (VITAMIN D3) 2000 UNITS TABS Take 1 tablet by mouth daily.      . cloNIDine (CATAPRES - DOSED IN MG/24 HR) 0.1 mg/24hr patch Place 1 patch (0.1 mg total) onto the skin once a week. 4 patch 12  . escitalopram (LEXAPRO) 10 MG tablet TAKE ONE TABLET BY MOUTH ONCE DAILY 30 tablet 1  . ferrous fumarate-iron polysaccharide complex (TANDEM) 162-115.2 MG CAPS Take 1 capsule by mouth daily with breakfast.    . furosemide (LASIX) 40 MG tablet TAKE ONE TABLET BY MOUTH ONCE DAILY 90 tablet 0  . KLOR-CON 10 10 MEQ tablet Take 1 tablet (10 mEq total) by mouth daily. Dispense Klor Con only 90 tablet 3  . levothyroxine (SYNTHROID, LEVOTHROID) 137 MCG tablet Take 1 tablet (137 mcg total) by mouth daily. As directed 90 tablet 2  . lisinopril (PRINIVIL,ZESTRIL) 40 MG tablet TAKE ONE TABLET BY MOUTH ONCE DAILY 90 tablet 0  . LORazepam (ATIVAN) 0.5 MG tablet One half tablet to one daily if needed for anxiety 30 tablet 1  . Multiple Vitamins-Minerals (CENTRUM SILVER) tablet Take 1 tablet by mouth daily.     Marland Kitchen omeprazole (PRILOSEC) 20 MG capsule Take 1 capsule (20 mg total) by mouth 2 (two) times daily before a meal. 60 capsule 2  . polyethylene glycol powder (GLYCOLAX/MIRALAX) powder Take 17 g by mouth daily as needed. 3350 g 1   Current Facility-Administered Medications on File Prior to Visit  Medication Dose Route Frequency Provider Last Rate Last Dose  . cyanocobalamin ((VITAMIN B-12)) injection 1,000 mcg  1,000 mcg Intramuscular Q30 days Ernestina Penna, MD   1,000 mcg at 07/24/16 1055    ALLERGIES: Allergies  Allergen Reactions  . Aspirin Nausea Only  . Codeine Nausea Only  . Sulfa Antibiotics   . Vicodin [Hydrocodone-Acetaminophen] Nausea Only  . Warfarin Sodium Other (See Comments)    Tingling all over    FAMILY HISTORY: Family History  Problem Relation Age of Onset  . Hypertension Mother   . Aneurysm Mother     Brain  .  Hypertension Father   . Kidney disease Father   . Aneurysm Father     heart  . Arthritis Daughter   . Cancer Daughter     breast  . Hyperlipidemia Sister     SOCIAL HISTORY: Social History   Social History  . Marital status: Divorced    Spouse name: N/A  . Number of children: 2  . Years of education: N/A   Occupational History  . retired     [employer]   Social History Main Topics  . Smoking status: Never Smoker  . Smokeless tobacco: Never Used  . Alcohol use No  . Drug use: No  . Sexual activity: Not on file   Other Topics Concern  . Not on file   Social History Narrative  . No narrative on file    REVIEW OF SYSTEMS: Constitutional: No fevers, chills, or sweats, no generalized fatigue, change in appetite Eyes: No visual changes, double vision, eye pain Ear, nose  and throat: hearing loss Cardiovascular: No chest pain, palpitations Respiratory:  No shortness of breath at rest or with exertion, wheezes GastrointestinaI: No nausea, vomiting, diarrhea, abdominal pain, fecal incontinence Genitourinary:  No dysuria, urinary retention or frequency Musculoskeletal: mild neck pain Integumentary: No rash, pruritus, skin lesions Neurological: as above Psychiatric: some anxiety Endocrine: No palpitations, fatigue, diaphoresis, mood swings, change in appetite, change in weight, increased thirst Hematologic/Lymphatic:  Swelling in the feet. Allergic/Immunologic: no itchy/runny eyes, nasal congestion, recent allergic reactions, rashes  PHYSICAL EXAM: Vitals:   07/27/16 0933  BP: 136/82  Pulse: (!) 56  Resp: 16  Temp: 98.1 F (36.7 C)   General: No acute distress.  Patient appears well-groomed.  Head:  Normocephalic/atraumatic Eyes:  fundi examined but not visualized Neck: supple, no paraspinal tenderness, full range of motion Back: No paraspinal tenderness Heart: regular rate and rhythm Lungs: Clear to auscultation bilaterally. Vascular: No carotid  bruits. Neurological Exam: Mental status: alert and oriented to person, place, and time, recent and remote memory intact, fund of knowledge intact, attention and concentration intact, speech fluent and not dysarthric, language intact. Cranial nerves: CN I: not tested CN II: pupils equal, round and reactive to light, visual fields intact CN III, IV, VI:  full range of motion, no nystagmus, no ptosis CN V: facial sensation intact CN VII: upper and lower face symmetric CN VIII: hearing intact CN IX, X: gag intact, uvula midline CN XI: sternocleidomastoid and trapezius muscles intact CN XII: tongue midline Bulk & Tone: normal, no fasciculations or rigidity. Motor:  5/5 throughout.  No bradykinesia Sensation: pinprick sensation intact.  Vibration sensation reduced in feet. Deep Tendon Reflexes:  2+ upper extremities, absent lower extremities, toes downgoing.  Finger to nose testing:  Without dysmetria. Gait:  Cautious, slow wide-based gait.  No shuffling.  Unable to tandem walk. Romberg with sway.  IMPRESSION: 1.  Tension-type and stabbing headache, possibly cervicogenic.  Neurologic exam nonfocal.  Overall, improved.  I don't suspect a serious intracranial abnormality. 2.  Benign essential tremor  PLAN: 1.  To treat headache, we will start gabapentin  at bedtime and slowly titrate to  twice daily as tolerated.  2.  We will hold off on treating the tremor until next visit as I do not want to start more than one medication at the same time.  Thank you for allowing me to take part in the care of this patient.  Shon Millet, DO  CC:  Rex Kras, MD  Rudi Heap, MD

## 2016-07-27 NOTE — Patient Instructions (Signed)
1.  Start gabapentin  capsules.Take 1 capsule at bedtime for 7 days,       then 1 capsule in morning and 1 capsule at bedtime for 7 days      Then 1 capsule in morning and 2 capsules at bedtime for 7 days      Then 2 capsules in morning and 2 capsules at bedtime     After 4 weeks on the 2 capsules twice daily, contact me and we can increase dose further if needed. 2.  On follow up, we can address the tremor.  I don't want to start 2 medications at the same time

## 2016-08-02 ENCOUNTER — Other Ambulatory Visit: Payer: Self-pay | Admitting: Family Medicine

## 2016-08-02 DIAGNOSIS — I10 Essential (primary) hypertension: Secondary | ICD-10-CM

## 2016-08-02 DIAGNOSIS — K5900 Constipation, unspecified: Secondary | ICD-10-CM

## 2016-08-08 ENCOUNTER — Ambulatory Visit: Payer: Medicare Other | Admitting: Family Medicine

## 2016-08-24 ENCOUNTER — Ambulatory Visit (INDEPENDENT_AMBULATORY_CARE_PROVIDER_SITE_OTHER): Payer: Medicare Other | Admitting: *Deleted

## 2016-08-24 DIAGNOSIS — E538 Deficiency of other specified B group vitamins: Secondary | ICD-10-CM | POA: Diagnosis not present

## 2016-08-24 NOTE — Progress Notes (Signed)
Left del - pt tolerated well

## 2016-09-03 ENCOUNTER — Ambulatory Visit: Payer: Self-pay | Admitting: Neurology

## 2016-09-05 ENCOUNTER — Encounter: Payer: Self-pay | Admitting: Family

## 2016-09-05 ENCOUNTER — Ambulatory Visit (INDEPENDENT_AMBULATORY_CARE_PROVIDER_SITE_OTHER): Payer: Medicare Other | Admitting: Family

## 2016-09-05 VITALS — BP 134/63 | HR 61 | Temp 97.7°F | Ht 63.5 in | Wt 208.8 lb

## 2016-09-05 DIAGNOSIS — W57XXXA Bitten or stung by nonvenomous insect and other nonvenomous arthropods, initial encounter: Secondary | ICD-10-CM

## 2016-09-05 DIAGNOSIS — S80862A Insect bite (nonvenomous), left lower leg, initial encounter: Secondary | ICD-10-CM | POA: Diagnosis not present

## 2016-09-05 MED ORDER — DOXYCYCLINE HYCLATE 100 MG PO TABS: 100.0000 mg | ORAL_TABLET | Freq: Two times a day (BID) | ORAL | 0 refills | Status: DC

## 2016-09-05 NOTE — Progress Notes (Signed)
   Subjective:    Patient ID: [name], female    DOB: 06/18/1932, 83 y.o.   MRN: [id number]  HPI PT presents to the office today with a tick bite on left lower leg. She states she removed it Saturday and is unsure how long it was attached. Pt states the skin is now erythemas and blistered. PT denies any fevers, new joint pain, or rash.   Review of Systems  Skin: Positive for wound.  All other systems reviewed and are negative.      Objective:   Physical Exam  Constitutional: She is oriented to person, place, and time. She appears well-developed and well-nourished. No distress.  HENT:  Head: Normocephalic.  Cardiovascular: Normal rate, regular rhythm, normal heart sounds and intact distal pulses.   No murmur heard. Pulmonary/Chest: Effort normal and breath sounds normal. No respiratory distress. She has no wheezes.  Abdominal: Soft. Bowel sounds are normal. She exhibits no distension. There is no tenderness.  Musculoskeletal: Normal range of motion. She exhibits no edema or tenderness.  Neurological: She is alert and oriented to person, place, and time.  Skin: Skin is warm and dry.  1.2X1.5 cm lesion on left medial leg with mildly erythemas border, no discharge present   Psychiatric: She has a normal mood and affect. Her behavior is normal. Judgment and thought content normal.  Vitals reviewed.       BP 134/63   Pulse 61   Temp 97.7 F (36.5 C) (Oral)   Ht 5' 3.5" (1.613 m)   Wt 208 lb 12.8 oz (94.7 kg)   BMI 36.41 kg/m      Assessment & Plan:  1. Tick bite of left lower leg, initial encounter -Pt to report any new fever, joint pain, or rash -Wear protective clothing while outside- Long sleeves and long pants -Put insect repellent on all exposed skin and along clothing -Take a shower as soon as possible after being outside RTO prn or if rash become worse or does not improve  - doxycycline (VIBRA-TABS) 100 MG tablet; Take 1 tablet (100 mg total) by mouth 2 (two)  times daily.  Dispense: 28 tablet; Refill: 0   Jannifer Rodneyhristy Ruston Fedora, FNP

## 2016-09-05 NOTE — Patient Instructions (Signed)

## 2016-09-13 ENCOUNTER — Ambulatory Visit (INDEPENDENT_AMBULATORY_CARE_PROVIDER_SITE_OTHER): Payer: Medicare Other | Admitting: Family Medicine

## 2016-09-13 ENCOUNTER — Encounter: Payer: Self-pay | Admitting: Family Medicine

## 2016-09-13 VITALS — BP 124/57 | HR 49 | Temp 97.9°F | Ht 63.5 in | Wt 207.0 lb

## 2016-09-13 DIAGNOSIS — K2100 Gastro-esophageal reflux disease with esophagitis, without bleeding: Secondary | ICD-10-CM

## 2016-09-13 DIAGNOSIS — E538 Deficiency of other specified B group vitamins: Secondary | ICD-10-CM

## 2016-09-13 DIAGNOSIS — I480 Paroxysmal atrial fibrillation: Secondary | ICD-10-CM

## 2016-09-13 DIAGNOSIS — E559 Vitamin D deficiency, unspecified: Secondary | ICD-10-CM | POA: Diagnosis not present

## 2016-09-13 DIAGNOSIS — I878 Other specified disorders of veins: Secondary | ICD-10-CM | POA: Diagnosis not present

## 2016-09-13 DIAGNOSIS — I700 Atherosclerosis of aorta: Secondary | ICD-10-CM

## 2016-09-13 DIAGNOSIS — E034 Atrophy of thyroid (acquired): Secondary | ICD-10-CM | POA: Diagnosis not present

## 2016-09-13 DIAGNOSIS — I10 Essential (primary) hypertension: Secondary | ICD-10-CM | POA: Diagnosis not present

## 2016-09-13 DIAGNOSIS — E7800 Pure hypercholesterolemia, unspecified: Secondary | ICD-10-CM

## 2016-09-13 NOTE — Progress Notes (Signed)
Subjective:    Patient ID: [name], female    DOB: 29-Mar-1933, 83 y.o.   MRN: [id number]  HPI  Pt here for follow up and management of chronic medical problems which includes a fib, hyperlipidemia and hypertension. She is taking medication regularly.The patient is followed regularly by the cardiologist because of her atrial fibrillation. She also has venous stasis and B12 deficiency. She has had x-rays of her low back showing degenerative changes. She had a chest x-ray this past winter because of her current pneumonia which showed cardiac enlargement. She's had an hepatobiliary scan that did not show any gallstones and a gallbladder ejection fraction of 87%. She had an ultrasound of the pelvis which showed the uterus endometrium and ovaries to be unremarkable. The patient is complaining with the swelling in her legs. She has had a recent tick bite and has been on antibiotics for this and has not been wearing her support hose. Her weight is actually down a couple of pounds since her last visit. She does have venous stasis. She denies any chest pain. She is having less shortness of breath since she got her reflux and hernia under better control with taking omeprazole twice daily. She did see the gastroenterologist about this. She has had an endoscopy and a colonoscopy. She denies any nausea vomiting or diarrhea or blood in the stool. She is passing her water without problems. She saw Dr. Amedeo Plenty and he encouraged her to take the omeprazole twice a day and she is taking MiraLAX daily and this is helped control the abdominal pain that was worked up over the winter months.   Patient Active Problem List   Diagnosis Date Noted  . Hearing loss 01/06/2015  . Thoracic aorta atherosclerosis ([place]) 03/04/2014  . Degenerative arthritis of thoracic spine 03/04/2014  . Osteoporosis, post-menopausal 08/12/2013  . Metabolic syndrome 60/73/7106  . Anemia, iron deficiency 03/16/2013  . Vitamin D deficiency  03/16/2013  . HTN (hypertension) 03/16/2013  . Hypothyroidism   . Diaphragmatic hernia without mention of obstruction or gangrene   . GERD (gastroesophageal reflux disease)   . Hyperlipidemia   . Prolapse of vaginal walls without mention of uterine prolapse   . Arthritis   . First degree atrioventricular block   . Symptomatic menopausal or female climacteric states   . AF (atrial fibrillation) ([place])   . Rectal polyp   . Gastric polyp    Outpatient Encounter Prescriptions as of 09/13/2016  Medication Sig  . albuterol (PROVENTIL HFA;VENTOLIN HFA) 108 (90 Base) MCG/ACT inhaler Inhale 2 puffs into the lungs every 4 (four) hours as needed for wheezing or shortness of breath.  Marland Kitchen aspirin 81 MG chewable tablet Chew 81 mg by mouth daily.  . calcium-vitamin D (OSCAL WITH D) 500-200 MG-UNIT per tablet Take 1 tablet by mouth daily.  . cefUROXime (CEFTIN) 250 MG tablet Take 250 mg by mouth 2 (two) times daily.  . Cholecalciferol (VITAMIN D3) 2000 UNITS TABS Take 1 tablet by mouth daily.    . cloNIDine (CATAPRES - DOSED IN MG/24 HR) 0.1 mg/24hr patch Place 1 patch (0.1 mg total) onto the skin once a week.  . doxycycline (VIBRA-TABS) 100 MG tablet Take 1 tablet (100 mg total) by mouth 2 (two) times daily.  Marland Kitchen escitalopram (LEXAPRO) 10 MG tablet TAKE ONE TABLET BY MOUTH ONCE DAILY  . ferrous fumarate-iron polysaccharide complex (TANDEM) 162-115.2 MG CAPS Take 1 capsule by mouth daily with breakfast.  . furosemide (LASIX) 40 MG tablet TAKE ONE TABLET BY  MOUTH ONCE DAILY  . gabapentin (NEURONTIN) 100 MG capsule Take 1cap at bedtime for 7days, then 1cap twice daily for 7days, then 1cap in AM and 2 caps at bedtime for 7days, then 2caps twice daily  . KLOR-CON 10 10 MEQ tablet Take 1 tablet (10 mEq total) by mouth daily. Dispense Klor Con only  . levothyroxine (SYNTHROID, LEVOTHROID) 137 MCG tablet TAKE ONE TABLET BY MOUTH ONCE DAILY AS DIRECTED  . lisinopril (PRINIVIL,ZESTRIL) 40 MG tablet TAKE ONE TABLET BY  MOUTH ONCE DAILY  . LORazepam (ATIVAN) 0.5 MG tablet One half tablet to one daily if needed for anxiety  . Multiple Vitamins-Minerals (CENTRUM SILVER) tablet Take 1 tablet by mouth daily.   Marland Kitchen omeprazole (PRILOSEC) 20 MG capsule Take 1 capsule (20 mg total) by mouth 2 (two) times daily before a meal.  . polyethylene glycol powder (GLYCOLAX/MIRALAX) powder TAKE 17G BY MOUTH DAILY AS NEEDED   Facility-Administered Encounter Medications as of 09/13/2016  Medication  . cyanocobalamin ((VITAMIN B-12)) injection 1,000 mcg      Review of Systems  Constitutional: Negative.   HENT: Negative.   Eyes: Negative.   Respiratory: Negative.   Cardiovascular: Positive for leg swelling (bilateral ).  Gastrointestinal: Negative.   Endocrine: Negative.   Genitourinary: Negative.   Musculoskeletal: Negative.   Skin: Negative.   Allergic/Immunologic: Negative.   Neurological: Negative.   Hematological: Negative.   Psychiatric/Behavioral: Negative.        Objective:   Physical Exam  Constitutional: She is oriented to person, place, and time. She appears well-developed and well-nourished.  The patient is pleasant and alert and seems to be feeling better after her multiple GI studies and evaluations from the wintertime.  HENT:  Head: Normocephalic and atraumatic.  Right Ear: External ear normal.  Left Ear: External ear normal.  Nose: Nose normal.  Mouth/Throat: Oropharynx is clear and moist.  Eyes: Conjunctivae and EOM are normal. Pupils are equal, round, and reactive to light. Right eye exhibits no discharge. Left eye exhibits no discharge. No scleral icterus.  Neck: Normal range of motion. Neck supple. No thyromegaly present.  No bruits thyromegaly or anterior cervical adenopathy  Cardiovascular: Normal rate, regular rhythm, normal heart sounds and intact distal pulses.   No murmur heard. Heart is regular at 60/m  Pulmonary/Chest: Effort normal and breath sounds normal. No respiratory distress.  She has no wheezes. She has no rales.  Clear anteriorly and posteriorly  Abdominal: Soft. Bowel sounds are normal. She exhibits no mass. There is no tenderness. There is no rebound and no guarding.  Abdominal obesity without masses tenderness or organ megaly or bruits  Musculoskeletal: Normal range of motion. She exhibits edema.  The patient does have a lot of varicosities in her lower extremities and some pedal edema bilaterally. The area of the recent tick bite appears to be healing well.  Lymphadenopathy:    She has no cervical adenopathy.  Neurological: She is alert and oriented to person, place, and time. She has normal reflexes. No cranial nerve deficit.  Skin: Skin is warm and dry. No rash noted.  The tick bite appears to be well healed with only an eschar present.  Psychiatric: She has a normal mood and affect. Her behavior is normal. Judgment and thought content normal.  Nursing note and vitals reviewed.   BP (!) 124/57 (BP Location: Left Wrist)   Pulse (!) 49   Temp 97.9 F (36.6 C) (Oral)   Ht 5' 3.5" (1.613 m)   Wt 207 lb (  93.9 kg)   BMI 36.09 kg/m        Assessment & Plan:  1. Essential hypertension -The blood pressure is good today and the patient will continue with current treatment - CBC with Differential/Platelet - BMP8+EGFR - Hepatic function panel  2. Paroxysmal atrial fibrillation (HCC) -Follow-up with cardiology as planned - CBC with Differential/Platelet  3. Gastroesophageal reflux disease with esophagitis -Continue with omeprazole twice daily as recommended by gastroenterology and follow-up with them as planned - CBC with Differential/Platelet - Hepatic function panel  4. Hypothyroidism due to acquired atrophy of thyroid -Continue current thyroid treatment pending results of lab work - CBC with Differential/Platelet - Thyroid Panel With TSH  5. Vitamin D deficiency -Continue vitamin D replacement pending results of lab work - CBC with  Differential/Platelet - VITAMIN D 25 Hydroxy (Vit-D Deficiency, Fractures)  6. Pure hypercholesterolemia -Continue with aggressive therapeutic lifestyle changes pending results of lab work - CBC with Differential/Platelet - Lipid panel  7. Vitamin B 12 deficiency -Continue with vitamin B12 replacement  8. Thoracic aorta atherosclerosis ([place]) -Continue aggressive therapeutic lifestyle changes  9. Venous stasis -Take Lasix as directed and start using support hose again and apply these the first thing with arising in the morning  10. Morbid obesity -The patient has 2 or more comorbid risk factors and a BMI that is 36.5. She understands the importance of weight loss and dietary management of weight with exercise.  Patient Instructions                       Medicare Annual Wellness Visit  [place] and the medical providers at [facility] strive to bring you the best medical care.  In doing so we not only want to address your current medical conditions and concerns but also to detect new conditions early and prevent illness, disease and health-related problems.    Medicare offers a yearly Wellness Visit which allows our clinical staff to assess your need for preventative services including immunizations, lifestyle education, counseling to decrease risk of preventable diseases and screening for fall risk and other medical concerns.    This visit is provided free of charge (no copay) for all Medicare recipients. The clinical pharmacists at [facility] have begun to conduct these Wellness Visits which will also include a thorough review of all your medications.    As you primary medical provider recommend that you make an appointment for your Annual Wellness Visit if you have not done so already this year.  You may set up this appointment before you leave today or you may call back [phone]) and schedule an appointment.  Please make sure when you  call that you mention that you are scheduling your Annual Wellness Visit with the clinical pharmacist so that the appointment may be made for the proper length of time.     Continue current medications. Continue good therapeutic lifestyle changes which include good diet and exercise. Fall precautions discussed with patient. If an FOBT was given today- please return it to our front desk. If you are over 24 years old - you may need Prevnar 34 or the adult Pneumonia vaccine.  **Flu shots are available--- please call and schedule a FLU-CLINIC appointment**  After your visit with [place] today you will receive a survey in the mail or online from [employer] regarding your care with [place]. Please take a moment to fill this out. Your feedback is very important to [place] as  you can help [place] better understand your patient needs as well as improve your experience and satisfaction. WE CARE ABOUT YOU!!!   Take an extra fluid pill about 4:00 this afternoon and tomorrow take a whole one in the morning and a half a one at 4:00 in the afternoon and then resume 40 mg once daily after that Try to get your support hose back on and the should be placed on the first thing with arising out of the bed in the morning Continue with your omeprazole and other medications. Watch sodium intake and reduce as much as possible  Arrie Senate MD

## 2016-09-13 NOTE — Patient Instructions (Addendum)
Medicare Annual Wellness Visit  [place] and the medical providers at [facility] strive to bring you the best medical care.  In doing so we not only want to address your current medical conditions and concerns but also to detect new conditions early and prevent illness, disease and health-related problems.    Medicare offers a yearly Wellness Visit which allows our clinical staff to assess your need for preventative services including immunizations, lifestyle education, counseling to decrease risk of preventable diseases and screening for fall risk and other medical concerns.    This visit is provided free of charge (no copay) for all Medicare recipients. The clinical pharmacists at [facility] have begun to conduct these Wellness Visits which will also include a thorough review of all your medications.    As you primary medical provider recommend that you make an appointment for your Annual Wellness Visit if you have not done so already this year.  You may set up this appointment before you leave today or you may call back [phone] and schedule an appointment.  Please make sure when you call that you mention that you are scheduling your Annual Wellness Visit with the clinical pharmacist so that the appointment may be made for the proper length of time.     Continue current medications. Continue good therapeutic lifestyle changes which include good diet and exercise. Fall precautions discussed with patient. If an FOBT was given today- please return it to our front desk. If you are over 532 years old - you may need Prevnar 13 or the adult Pneumonia vaccine.  **Flu shots are available--- please call and schedule a FLU-CLINIC appointment**  After your visit with us today you will receive a survey in the mail or online from [employer] regarding your care with us. Please take a moment to fill this out. Your feedback is very  important to us as you can help us better understand your patient needs as well as improve your experience and satisfaction. WE CARE ABOUT YOU!!!   Take an extra fluid pill about 4:00 this afternoon and tomorrow take a whole one in the morning and a half a one at 4:00 in the afternoon and then resume 40 mg once daily after that Try to get your support hose back on and the should be placed on the first thing with arising out of the bed in the morning Continue with your omeprazole and other medications. Watch sodium intake and reduce as much as possible

## 2016-09-14 ENCOUNTER — Encounter: Payer: Self-pay | Admitting: Family Medicine

## 2016-09-14 DIAGNOSIS — D696 Thrombocytopenia, unspecified: Secondary | ICD-10-CM | POA: Insufficient documentation

## 2016-09-14 DIAGNOSIS — R710 Precipitous drop in hematocrit: Secondary | ICD-10-CM | POA: Insufficient documentation

## 2016-09-14 LAB — CBC WITH DIFFERENTIAL/PLATELET
BASOS: 1 %
Basophils Absolute: 0 10*3/uL (ref 0.0–0.2)
EOS (ABSOLUTE): 0.3 10*3/uL (ref 0.0–0.4)
EOS: 8 %
HEMATOCRIT: 32 % — AB (ref 34.0–46.6)
Hemoglobin: 10.5 g/dL — ABNORMAL LOW (ref 11.1–15.9)
Immature Grans (Abs): 0 10*3/uL (ref 0.0–0.1)
Immature Granulocytes: 0 %
LYMPHS ABS: 1 10*3/uL (ref 0.7–3.1)
Lymphs: 25 %
MCH: 30.3 pg (ref 26.6–33.0)
MCHC: 32.8 g/dL (ref 31.5–35.7)
MCV: 92 fL (ref 79–97)
MONOS ABS: 0.4 10*3/uL (ref 0.1–0.9)
Monocytes: 11 %
Neutrophils Absolute: 2.1 10*3/uL (ref 1.4–7.0)
Neutrophils: 55 %
Platelets: 71 10*3/uL — CL (ref 150–379)
RBC: 3.47 x10E6/uL — ABNORMAL LOW (ref 3.77–5.28)
RDW: 14.8 % (ref 12.3–15.4)
WBC: 3.8 10*3/uL (ref 3.4–10.8)

## 2016-09-14 LAB — BMP8+EGFR
BUN/Creatinine Ratio: 18 (ref 12–28)
BUN: 21 mg/dL (ref 8–27)
CALCIUM: 9.8 mg/dL (ref 8.7–10.3)
CO2: 28 mmol/L (ref 20–29)
Chloride: 98 mmol/L (ref 96–106)
Creatinine, Ser: 1.18 mg/dL — ABNORMAL HIGH (ref 0.57–1.00)
GFR, EST AFRICAN AMERICAN: 50 mL/min/{1.73_m2} — AB (ref >59)
GFR, EST NON AFRICAN AMERICAN: 43 mL/min/{1.73_m2} — AB (ref >59)
Glucose: 90 mg/dL (ref 65–99)
Potassium: 5.1 mmol/L (ref 3.5–5.2)
Sodium: 141 mmol/L (ref 134–144)

## 2016-09-14 LAB — HEPATIC FUNCTION PANEL
ALT: 14 IU/L (ref 0–32)
AST: 26 IU/L (ref 0–40)
Albumin: 4.4 g/dL (ref 3.5–4.7)
Alkaline Phosphatase: 170 IU/L — ABNORMAL HIGH (ref 39–117)
BILIRUBIN TOTAL: 0.8 mg/dL (ref 0.0–1.2)
Bilirubin, Direct: 0.28 mg/dL (ref 0.00–0.40)
Total Protein: 7.1 g/dL (ref 6.0–8.5)

## 2016-09-14 LAB — LIPID PANEL
CHOL/HDL RATIO: 2.6 ratio (ref 0.0–4.4)
Cholesterol, Total: 161 mg/dL (ref 100–199)
HDL: 61 mg/dL (ref >39)
LDL Calculated: 84 mg/dL (ref 0–99)
Triglycerides: 78 mg/dL (ref 0–149)
VLDL Cholesterol Cal: 16 mg/dL (ref 5–40)

## 2016-09-14 LAB — THYROID PANEL WITH TSH
Free Thyroxine Index: 2.6 (ref 1.2–4.9)
T3 Uptake Ratio: 31 % (ref 24–39)
T4, Total: 8.3 ug/dL (ref 4.5–12.0)
TSH: 1.16 u[IU]/mL (ref 0.450–4.500)

## 2016-09-14 LAB — VITAMIN D 25 HYDROXY (VIT D DEFICIENCY, FRACTURES): Vit D, 25-Hydroxy: 58 ng/mL (ref 30.0–100.0)

## 2016-09-17 ENCOUNTER — Telehealth: Payer: Self-pay | Admitting: Family Medicine

## 2016-09-17 DIAGNOSIS — R710 Precipitous drop in hematocrit: Secondary | ICD-10-CM

## 2016-09-17 DIAGNOSIS — D509 Iron deficiency anemia, unspecified: Secondary | ICD-10-CM

## 2016-09-17 NOTE — Telephone Encounter (Signed)
Aware of lab results CBC ordered for a week

## 2016-09-26 ENCOUNTER — Ambulatory Visit (INDEPENDENT_AMBULATORY_CARE_PROVIDER_SITE_OTHER): Payer: Medicare Other

## 2016-09-26 DIAGNOSIS — R7989 Other specified abnormal findings of blood chemistry: Secondary | ICD-10-CM

## 2016-09-26 DIAGNOSIS — E538 Deficiency of other specified B group vitamins: Secondary | ICD-10-CM

## 2016-09-26 LAB — BMP8+EGFR
BUN / CREAT RATIO: 19 (ref 12–28)
BUN: 23 mg/dL (ref 8–27)
CALCIUM: 9.6 mg/dL (ref 8.7–10.3)
CHLORIDE: 99 mmol/L (ref 96–106)
CO2: 29 mmol/L (ref 20–29)
CREATININE: 1.21 mg/dL — AB (ref 0.57–1.00)
GFR calc Af Amer: 48 mL/min/{1.73_m2} — ABNORMAL LOW (ref >59)
GFR calc non Af Amer: 42 mL/min/{1.73_m2} — ABNORMAL LOW (ref >59)
GLUCOSE: 85 mg/dL (ref 65–99)
Potassium: 4.5 mmol/L (ref 3.5–5.2)
Sodium: 141 mmol/L (ref 134–144)

## 2016-10-16 ENCOUNTER — Other Ambulatory Visit: Payer: Self-pay | Admitting: Family Medicine

## 2016-10-16 DIAGNOSIS — K5900 Constipation, unspecified: Secondary | ICD-10-CM

## 2016-10-19 ENCOUNTER — Other Ambulatory Visit: Payer: Self-pay | Admitting: Family Medicine

## 2016-10-29 ENCOUNTER — Ambulatory Visit (INDEPENDENT_AMBULATORY_CARE_PROVIDER_SITE_OTHER): Payer: Medicare Other | Admitting: Neurology

## 2016-10-29 ENCOUNTER — Encounter: Payer: Self-pay | Admitting: Neurology

## 2016-10-29 VITALS — BP 138/58 | HR 54 | Ht 64.0 in | Wt 212.1 lb

## 2016-10-29 DIAGNOSIS — G4485 Primary stabbing headache: Secondary | ICD-10-CM

## 2016-10-29 DIAGNOSIS — G250 Essential tremor: Secondary | ICD-10-CM

## 2016-10-29 DIAGNOSIS — G44209 Tension-type headache, unspecified, not intractable: Secondary | ICD-10-CM | POA: Diagnosis not present

## 2016-10-29 MED ORDER — GABAPENTIN 100 MG PO CAPS
100.0000 mg | ORAL_CAPSULE | Freq: Two times a day (BID) | ORAL | 4 refills | Status: DC
Start: 2016-10-29 — End: 2016-10-29

## 2016-10-29 MED ORDER — GABAPENTIN 100 MG PO CAPS: 100.0000 mg | ORAL_CAPSULE | Freq: Two times a day (BID) | ORAL | 4 refills | Status: DC

## 2016-10-29 NOTE — Progress Notes (Signed)
NEUROLOGY FOLLOW UP OFFICE NOTE  [name] [id number]  HISTORY OF PRESENT ILLNESS: [name] is an 83 year old female with atrial fibrillation, hypothyroidism and hypertension who follows up for stabbing/tension-type headache and tremor.  She is accompanied by her daughter who supplements history.    UPDATE: Headache: She was started on gabapentin, with directions to titrate to 200mg  twice daily.  She was doing well on 100mg  twice daily so she didn't titrate further.  She has since decreased dose to 100mg  at bedtime.  Headaches are improved.  She no longer has tension type headaches.  Stabbing headaches are infrequent. Intensity:  7/10 Duration:  seconds Frequency:  2 to 3 times a week Frequency of abortive medication: infrequent Current NSAIDS:  no Current analgesics:  Tylenol Current triptans:  no Current anti-emetic:  no Current muscle relaxants:  no Current anti-anxiolytic:  lorazepam Current sleep aide:  no Current Antihypertensive medications:  Lisinopril, furosemide, clonidine Current Antidepressant medications:  escitalopram 10mg  Current Anticonvulsant medications:  Gabapentin 100mg  Current Vitamins/Herbal/Supplements:  no Current Antihistamines/Decongestants:  Alelgra Other therapy:  no   Alcohol:  no Smoker:  no Depression/anxiety:  some  Essential Tremor: Unchanged.  HISTORY:  Headache: Onset:  March 2018.  It has gotten a little better. Location:  Dull pain on top of head; stabbing pain various Quality:  constant dull pain; also paroxysmal stabbing pain Initial Intensity:  constant dull pain 4/10; shooting pain 7-8/10 Aura:  no Prodrome:  no Postdrome:  no Associated symptoms:  No nausea, vomiting, photophobia, phonophobia, visual disturbance, focal numbness or weakness.  She has not had any new worse headache of her life, waking up from sleep Initial Duration:  dull pain constant; shooting pain seconds Initial Frequency:  dull pain constant;  shooting pain several times daily Initial Frequency of abortive medication: less than once a week Triggers/exacerbating factors:  At first, she thought it was side effect of medication, since it correlated with initiation of Eliquis.  Stress may make it worse. Relieving factors:  no Activity:  functions   Past NSAIDS:  Ibuprofen (cannot take due to stomach upset) Past analgesics:  no Past abortive triptans:  no Past muscle relaxants:  no Past anti-emetic:  no Past antihypertensive medications:  no Past antidepressant medications:  no Past anticonvulsant medications:  no Past vitamins/Herbal/Supplements:  no Past antihistamines/decongestants:  no Other past therapies:  no   Family history of headache:  No   TREMOR: She has had tremor in the hands for at least 10 years and has gradually gotten worse.  It started in the left hand and then moved to the right hand.  She is fine at rest.  She notices it when she uses her hand and is disruptive when she writes, uses utensils or holds a book.  She is able to dress without difficulty.  Tremor does not run in the family  PAST MEDICAL HISTORY: Past Medical History:  Diagnosis Date  . AF (atrial fibrillation) (HCC)   . Arthritis   . Diaphragmatic hernia without mention of obstruction or gangrene   . Esophagitis   . First degree atrioventricular block   . Gastric polyp   . GERD (gastroesophageal reflux disease)   . Heme positive stool   . Hyperlipidemia   . Hypertension   . Hypothyroid   . Prolapse of vaginal walls without mention of uterine prolapse   . Rectal polyp     MEDICATIONS: Current Outpatient Prescriptions on File Prior to Visit  Medication Sig Dispense Refill  .  albuterol (PROVENTIL HFA;VENTOLIN HFA) 108 (90 Base) MCG/ACT inhaler Inhale 2 puffs into the lungs every 4 (four) hours as needed for wheezing or shortness of breath. 8 g 5  . aspirin 81 MG chewable tablet Chew 81 mg by mouth daily.    . calcium-vitamin D (OSCAL  WITH D) 500-200 MG-UNIT per tablet Take 1 tablet by mouth daily.    . cefUROXime (CEFTIN) 250 MG tablet Take 250 mg by mouth 2 (two) times daily.  0  . Cholecalciferol (VITAMIN D3) 2000 UNITS TABS Take 1 tablet by mouth daily.      Marland Kitchen. escitalopram (LEXAPRO) 10 MG tablet TAKE ONE TABLET BY MOUTH ONCE DAILY 30 tablet 1  . ferrous fumarate-iron polysaccharide complex (TANDEM) 162-115.2 MG CAPS Take 1 capsule by mouth daily with breakfast.    . furosemide (LASIX) 40 MG tablet TAKE ONE TABLET BY MOUTH ONCE DAILY 90 tablet 0  . KLOR-CON 10 10 MEQ tablet TAKE ONE TABLET BY MOUTH ONCE DAILY 90 tablet 1  . levothyroxine (SYNTHROID, LEVOTHROID) 137 MCG tablet TAKE ONE TABLET BY MOUTH ONCE DAILY AS DIRECTED 90 tablet 2  . lisinopril (PRINIVIL,ZESTRIL) 40 MG tablet TAKE ONE TABLET BY MOUTH ONCE DAILY 90 tablet 0  . LORazepam (ATIVAN) 0.5 MG tablet One half tablet to one daily if needed for anxiety 30 tablet 1  . Multiple Vitamins-Minerals (CENTRUM SILVER) tablet Take 1 tablet by mouth daily.     Marland Kitchen. omeprazole (PRILOSEC) 20 MG capsule Take 1 capsule (20 mg total) by mouth 2 (two) times daily before a meal. 60 capsule 2  . polyethylene glycol powder (GLYCOLAX/MIRALAX) powder DISSOLVE 17GRAMS OF POWDER IN WATER & DRINK ONCE DAILY AS NEEDED 527 g 1   Current Facility-Administered Medications on File Prior to Visit  Medication Dose Route Frequency Provider Last Rate Last Dose  . cyanocobalamin ((VITAMIN B-12)) injection 1,000 mcg  1,000 mcg Intramuscular Q30 days Ernestina PennaMoore, Donald W, MD   1,000 mcg at 09/26/16 [id number]    ALLERGIES: Allergies  Allergen Reactions  . Aspirin Nausea Only  . Codeine Nausea Only  . Sulfa Antibiotics   . Vicodin [Hydrocodone-Acetaminophen] Nausea Only  . Warfarin Sodium Other (See Comments)    Tingling all over    FAMILY HISTORY: Family History  Problem Relation Age of Onset  . Hypertension Mother   . Aneurysm Mother        Brain  . Hypertension Father   . Kidney disease Father    . Aneurysm Father        heart  . Arthritis Daughter   . Cancer Daughter        breast  . Hyperlipidemia Sister     SOCIAL HISTORY: Social History   Social History  . Marital status: Divorced    Spouse name: N/A  . Number of children: 2  . Years of education: N/A   Occupational History  . retired     [employer]   Social History Main Topics  . Smoking status: Never Smoker  . Smokeless tobacco: Never Used  . Alcohol use No  . Drug use: No  . Sexual activity: Not on file   Other Topics Concern  . Not on file   Social History Narrative  . No narrative on file    REVIEW OF SYSTEMS: Constitutional: No fevers, chills, or sweats, no generalized fatigue, change in appetite Eyes: No visual changes, double vision, eye pain Ear, nose and throat: No hearing loss, ear pain, nasal congestion, sore throat Cardiovascular: No chest pain,  palpitations Respiratory:  No shortness of breath at rest or with exertion, wheezes GastrointestinaI: No nausea, vomiting, diarrhea, abdominal pain, fecal incontinence Genitourinary:  No dysuria, urinary retention or frequency Musculoskeletal:  No neck pain, back pain Integumentary: No rash, pruritus, skin lesions Neurological: as above Psychiatric: No depression, insomnia, anxiety Endocrine: No palpitations, fatigue, diaphoresis, mood swings, change in appetite, change in weight, increased thirst Hematologic/Lymphatic:  No purpura, petechiae. Allergic/Immunologic: no itchy/runny eyes, nasal congestion, recent allergic reactions, rashes  PHYSICAL EXAM: Vitals:   10/29/16 1109  BP: (!) 138/58  Pulse: (!) 54   General: No acute distress.  Patient appears well-groomed.   Head:  Normocephalic/atraumatic Eyes:  Fundi examined but not visualized Neck: supple, no paraspinal tenderness, full range of motion Heart:  Regular rate and rhythm Lungs:  Clear to auscultation bilaterally Back: No paraspinal tenderness Neurological Exam: Mental status:  alert and oriented to person, place, and time, recent and remote memory intact, fund of knowledge intact, attention and concentration intact, speech fluent and not dysarthric, language intact; CN:  II-XII intact; Bulk & Tone: normal, no fasciculations or rigidity; Motor:  5/5 throughout.  No bradykinesia; Sensation:light touch sensation intact.  Vibration sensation reduced in feet; Deep Tendon Reflexes:  2+ upper extremities, absent lower extremities, toes downgoing.; Finger to nose testing:  Without dysmetria; Gait:  Cautious, slow wide-based gait.  No shuffling.  Unable to tandem walk. Romberg with sway.  IMPRESSION: Tension type headache improved Primary stabbing headache improved Benign essential tremor, stable  PLAN: Continue gabapentin 100mg  at bedtime and she may discontinue or increase dose as desired. Follow up in 5 months.  Shon Millet, DO  CC:  Rudi Heap, MD

## 2016-10-29 NOTE — Patient Instructions (Signed)
1.  Continue gabapentin once daily.  If headaches increase, increase dose to 1 pill twice daily. 2.  Follow up in 5 months

## 2016-10-30 ENCOUNTER — Ambulatory Visit (INDEPENDENT_AMBULATORY_CARE_PROVIDER_SITE_OTHER): Payer: Medicare Other | Admitting: *Deleted

## 2016-10-30 DIAGNOSIS — L84 Corns and callosities: Secondary | ICD-10-CM | POA: Diagnosis not present

## 2016-10-30 DIAGNOSIS — I70203 Unspecified atherosclerosis of native arteries of extremities, bilateral legs: Secondary | ICD-10-CM | POA: Diagnosis not present

## 2016-10-30 DIAGNOSIS — M79676 Pain in unspecified toe(s): Secondary | ICD-10-CM | POA: Diagnosis not present

## 2016-10-30 DIAGNOSIS — E538 Deficiency of other specified B group vitamins: Secondary | ICD-10-CM

## 2016-10-30 DIAGNOSIS — B351 Tinea unguium: Secondary | ICD-10-CM | POA: Diagnosis not present

## 2016-10-30 NOTE — Progress Notes (Signed)
Pt given Vit B12 inj Tolerated well 

## 2016-11-05 ENCOUNTER — Other Ambulatory Visit: Payer: Self-pay | Admitting: Family Medicine

## 2016-11-05 DIAGNOSIS — I10 Essential (primary) hypertension: Secondary | ICD-10-CM

## 2016-11-06 DIAGNOSIS — L03031 Cellulitis of right toe: Secondary | ICD-10-CM | POA: Diagnosis not present

## 2016-11-06 DIAGNOSIS — L600 Ingrowing nail: Secondary | ICD-10-CM | POA: Diagnosis not present

## 2016-11-30 ENCOUNTER — Telehealth: Payer: Self-pay | Admitting: *Deleted

## 2016-11-30 ENCOUNTER — Ambulatory Visit (INDEPENDENT_AMBULATORY_CARE_PROVIDER_SITE_OTHER): Payer: Medicare Other | Admitting: *Deleted

## 2016-11-30 DIAGNOSIS — E538 Deficiency of other specified B group vitamins: Secondary | ICD-10-CM | POA: Diagnosis not present

## 2016-11-30 MED ORDER — FERROUS FUM-IRON POLYSACCH 162-115.2 MG PO CAPS: 1.0000 | ORAL_CAPSULE | Freq: Every day | ORAL | 2 refills | Status: DC

## 2016-11-30 NOTE — Telephone Encounter (Signed)
Okay to refill, make sure that she gets a CBC at the next visit

## 2016-11-30 NOTE — Telephone Encounter (Signed)
Pt requesting refill on [facility]andem Please advise

## 2016-11-30 NOTE — Progress Notes (Signed)
Pt given Cyanocobalamin inj Tolerated well 

## 2016-12-17 ENCOUNTER — Encounter: Payer: Self-pay | Admitting: Family

## 2016-12-17 ENCOUNTER — Telehealth: Payer: Self-pay | Admitting: Family Medicine

## 2016-12-17 ENCOUNTER — Ambulatory Visit (INDEPENDENT_AMBULATORY_CARE_PROVIDER_SITE_OTHER): Payer: Medicare Other | Admitting: Family

## 2016-12-17 VITALS — BP 119/64 | HR 63 | Temp 98.8°F | Ht 64.0 in | Wt 214.0 lb

## 2016-12-17 DIAGNOSIS — J069 Acute upper respiratory infection, unspecified: Secondary | ICD-10-CM | POA: Diagnosis not present

## 2016-12-17 MED ORDER — BENZONATATE 200 MG PO CAPS: 200.0000 mg | ORAL_CAPSULE | Freq: Three times a day (TID) | ORAL | 0 refills | Status: DC | PRN

## 2016-12-17 MED ORDER — FLUTICASONE PROPIONATE 50 MCG/ACT NA SUSP: 2.0000 | Freq: Every day | NASAL | 6 refills | Status: DC

## 2016-12-17 NOTE — Telephone Encounter (Signed)
What symptoms do you have? Drainage, throat burning, coughing up yellow congestion  How long have you been sick? Since saturday  Have you been seen for this problem?  no  If your provider decides to give you a prescription, which pharmacy would you like for it to be sent to? [facility] in [place],   Patient informed that this information will be sent to the clinical staff for review and that they should receive a follow up call.

## 2016-12-17 NOTE — Progress Notes (Signed)
   Subjective:    Patient ID: [name], female    DOB: 09-07-1932, 83 y.o.   MRN: [id number]  Cough  This is a new problem. The current episode started in the past 7 days. The problem has been unchanged. The problem occurs every few minutes. The cough is non-productive. Associated symptoms include chills, a fever, headaches, nasal congestion, rhinorrhea, a sore throat and wheezing. Pertinent negatives include no ear congestion or ear pain. She has tried rest and OTC cough suppressant for the symptoms. The treatment provided mild relief.  Fever   Associated symptoms include coughing, headaches, a sore throat and wheezing. Pertinent negatives include no ear pain.      Review of Systems  Constitutional: Positive for chills and fever.  HENT: Positive for rhinorrhea and sore throat. Negative for ear pain.   Respiratory: Positive for cough and wheezing.   Cardiovascular: Positive for leg swelling.  Neurological: Positive for headaches.  All other systems reviewed and are negative.      Objective:   Physical Exam  Constitutional: She is oriented to person, place, and time. She appears well-developed and well-nourished. No distress.  HENT:  Head: Normocephalic and atraumatic.  Right Ear: External ear normal.  Left Ear: External ear normal.  Nose: Mucosal edema and rhinorrhea present. Right sinus exhibits no maxillary sinus tenderness and no frontal sinus tenderness. Left sinus exhibits no maxillary sinus tenderness and no frontal sinus tenderness.  Mouth/Throat: Posterior oropharyngeal erythema present.  Eyes: Pupils are equal, round, and reactive to light.  Neck: Normal range of motion. Neck supple. No thyromegaly present.  Cardiovascular: Normal rate, regular rhythm, normal heart sounds and intact distal pulses.   No murmur heard. Pulmonary/Chest: Effort normal and breath sounds normal. No respiratory distress. She has no wheezes.  Dry nonproductive cough   Abdominal: Soft. Bowel  sounds are normal. She exhibits no distension. There is no tenderness.  Musculoskeletal: Normal range of motion. She exhibits edema (3+ BLE). She exhibits no tenderness.  Neurological: She is alert and oriented to person, place, and time.  Skin: Skin is warm and dry.  Psychiatric: She has a normal mood and affect. Her behavior is normal. Judgment and thought content normal.  Vitals reviewed.     BP 119/64   Pulse 63   Temp 98.8 F (37.1 C) (Oral)   Ht  (1.626 m)   Wt 214 lb (97.1 kg)   BMI 36.73 kg/m      Assessment & Plan:  1. Viral upper respiratory tract infection - Take meds as prescribed - Use a cool mist humidifier  -Use saline nose sprays frequently -Saline irrigations of the nose can be very helpful if done frequently. -Force fluids -For any cough or congestion  Use plain Mucinex- regular strength or max strength is fine -For fever or aces or pains- take tylenol or ibuprofen appropriate for age and weight. -Throat lozenges if help -New toothbrush in 3 days - fluticasone (FLONASE) 50 MCG/ACT nasal spray; Place 2 sprays into both nostrils daily.  Dispense: 16 g; Refill: 6 - benzonatate (TESSALON) 200 MG capsule; Take 1 capsule (200 mg total) by mouth 3 (three) times daily as needed for cough.  Dispense: 30 capsule; Refill: 0    Jannifer Rodney, FNP

## 2016-12-17 NOTE — Patient Instructions (Signed)
Upper Respiratory Infection, Adult Most upper respiratory infections (URIs) are a viral infection of the air passages leading to the lungs. A URI affects the nose, throat, and upper air passages. The most common type of URI is nasopharyngitis and is typically referred to as "the common cold." URIs run their course and usually go away on their own. Most of the time, a URI does not require medical attention, but sometimes a bacterial infection in the upper airways can follow a viral infection. This is called a secondary infection. Sinus and middle ear infections are common types of secondary upper respiratory infections. Bacterial pneumonia can also complicate a URI. A URI can worsen asthma and chronic obstructive pulmonary disease (COPD). Sometimes, these complications can require emergency medical care and may be life threatening. What are the causes? Almost all URIs are caused by viruses. A virus is a type of germ and can spread from one person to another. What increases the risk? You may be at risk for a URI if:  You smoke.  You have chronic heart or lung disease.  You have a weakened defense (immune) system.  You are very young or very old.  You have nasal allergies or asthma.  You work in crowded or poorly ventilated areas.  You work in health care facilities or schools.  What are the signs or symptoms? Symptoms typically develop 2-3 days after you come in contact with a cold virus. Most viral URIs last 7-10 days. However, viral URIs from the influenza virus (flu virus) can last 14-18 days and are typically more severe. Symptoms may include:  Runny or stuffy (congested) nose.  Sneezing.  Cough.  Sore throat.  Headache.  Fatigue.  Fever.  Loss of appetite.  Pain in your forehead, behind your eyes, and over your cheekbones (sinus pain).  Muscle aches.  How is this diagnosed? Your health care provider may diagnose a URI by:  Physical exam.  Tests to check that your  symptoms are not due to another condition such as: ? Strep throat. ? Sinusitis. ? Pneumonia. ? Asthma.  How is this treated? A URI goes away on its own with time. It cannot be cured with medicines, but medicines may be prescribed or recommended to relieve symptoms. Medicines may help:  Reduce your fever.  Reduce your cough.  Relieve nasal congestion.  Follow these instructions at home:  Take medicines only as directed by your health care provider.  Gargle warm saltwater or take cough drops to comfort your throat as directed by your health care provider.  Use a warm mist humidifier or inhale steam from a shower to increase air moisture. This may make it easier to breathe.  Drink enough fluid to keep your urine clear or pale yellow.  Eat soups and other clear broths and maintain good nutrition.  Rest as needed.  Return to work when your temperature has returned to normal or as your health care provider advises. You may need to stay home longer to avoid infecting others. You can also use a face mask and careful hand washing to prevent spread of the virus.  Increase the usage of your inhaler if you have asthma.  Do not use any tobacco products, including cigarettes, chewing tobacco, or electronic cigarettes. If you need help quitting, ask your health care provider. How is this prevented? The best way to protect yourself from getting a cold is to practice good hygiene.  Avoid oral or hand contact with people with cold symptoms.  Wash your   hands often if contact occurs.  There is no clear evidence that vitamin C, vitamin E, echinacea, or exercise reduces the chance of developing a cold. However, it is always recommended to get plenty of rest, exercise, and practice good nutrition. Contact a health care provider if:  You are getting worse rather than better.  Your symptoms are not controlled by medicine.  You have chills.  You have worsening shortness of breath.  You have  brown or red mucus.  You have yellow or brown nasal discharge.  You have pain in your face, especially when you bend forward.  You have a fever.  You have swollen neck glands.  You have pain while swallowing.  You have white areas in the back of your throat. Get help right away if:  You have severe or persistent: ? Headache. ? Ear pain. ? Sinus pain. ? Chest pain.  You have chronic lung disease and any of the following: ? Wheezing. ? Prolonged cough. ? Coughing up blood. ? A change in your usual mucus.  You have a stiff neck.  You have changes in your: ? Vision. ? Hearing. ? Thinking. ? Mood. This information is not intended to replace advice given to you by your health care provider. Make sure you discuss any questions you have with your health care provider. Document Released: 09/12/2000 Document Revised: 11/20/2015 Document Reviewed: 06/24/2013 Elsevier Interactive Patient Education  2017 [employer] Inc.  

## 2016-12-17 NOTE — Telephone Encounter (Signed)
Spoke with patient, she is running fever this morning, has cough, some shortness of breath, sore throat.  Recommended she be seen, appointment made at 10:40 am with [facility].

## 2016-12-20 ENCOUNTER — Ambulatory Visit (INDEPENDENT_AMBULATORY_CARE_PROVIDER_SITE_OTHER): Payer: Medicare Other | Admitting: Family Medicine

## 2016-12-20 ENCOUNTER — Encounter: Payer: Self-pay | Admitting: Family Medicine

## 2016-12-20 VITALS — BP 132/83 | HR 79 | Temp 100.3°F | Ht 64.0 in | Wt 218.0 lb

## 2016-12-20 DIAGNOSIS — J181 Lobar pneumonia, unspecified organism: Secondary | ICD-10-CM | POA: Diagnosis not present

## 2016-12-20 DIAGNOSIS — J189 Pneumonia, unspecified organism: Secondary | ICD-10-CM

## 2016-12-20 MED ORDER — AMOXICILLIN-POT CLAVULANATE 875-125 MG PO TABS: 1.0000 | ORAL_TABLET | Freq: Two times a day (BID) | ORAL | 0 refills | Status: DC

## 2016-12-20 NOTE — Progress Notes (Signed)
BP 132/83   Pulse 79   Temp 100.3 F (37.9 C) (Oral)   Ht  (1.626 m)   Wt 218 lb (98.9 kg)   SpO2 100%   BMI 37.42 kg/m    Subjective:    Patient ID: Tiffinie Caillier, female    DOB: 07/26/1932, 83 y.o.   MRN: [id number]  HPI: Margrette Wynia is a 83 y.o. female presenting on 12/20/2016 for gradually worsening, painful, productive, cough and chest congestion x 6 days. She was seen here on Monday and prescribed tessalon pearls which she did not take because of cost. She used OTC cough medication she had at home without much relief. She reports headache, mild wheezing, fever of 101 and chills at home. She denies nausea, vomiting, and shortness of breath.   HPI Relevant past medical, surgical, family and social history reviewed and updated as indicated. Interim medical history since our last visit reviewed. Allergies and medications reviewed and updated.  Review of Systems  Constitutional: Positive for chills, fatigue and fever.  HENT: Positive for congestion. Negative for rhinorrhea, sinus pain, sinus pressure, sneezing and sore throat.   Respiratory: Positive for cough and wheezing. Negative for chest tightness and shortness of breath.   Cardiovascular: Negative for chest pain, palpitations and leg swelling.  Gastrointestinal: Negative for abdominal distention, abdominal pain, diarrhea, nausea and vomiting.  Genitourinary: Negative for dysuria, frequency, hematuria and urgency.  Musculoskeletal: Negative for arthralgias, back pain and myalgias.  Skin: Negative for color change, pallor, rash and wound.  Neurological: Positive for headaches. Negative for dizziness, weakness and light-headedness.  Psychiatric/Behavioral: Negative for agitation, behavioral problems and confusion.    Per HPI unless specifically indicated above        Objective:    BP 132/83   Pulse 79   Temp 100.3 F (37.9 C) (Oral)   Ht  (1.626 m)   Wt 218 lb (98.9 kg)   SpO2 100%   BMI 37.42 kg/m     Wt Readings from Last 3 Encounters:  12/20/16 218 lb (98.9 kg)  12/17/16 214 lb (97.1 kg)  10/29/16 212 lb 1.6 oz (96.2 kg)    Physical Exam  Constitutional: She is oriented to person, place, and time. She appears well-developed and well-nourished.  Ill appearing  HENT:  Head: Normocephalic and atraumatic.  Right Ear: External ear normal.  Left Ear: External ear normal.  Mouth/Throat: Oropharynx is clear and moist. No oropharyngeal exudate.  Eyes: Pupils are equal, round, and reactive to light. Conjunctivae and EOM are normal. Right eye exhibits no discharge. Left eye exhibits no discharge. No scleral icterus.  Neck: Normal range of motion. Neck supple.  Cardiovascular: Normal rate and regular rhythm.   Murmur (2+ systolic ejection) heard. Pulmonary/Chest: Effort normal. No respiratory distress. She has rales (light crackles left lingula).  Abdominal: Soft. Bowel sounds are normal. She exhibits no distension. There is no tenderness.  Musculoskeletal: Normal range of motion. She exhibits no tenderness.  Neurological: She is alert and oriented to person, place, and time. She has normal reflexes.  Skin: Skin is warm and dry. No rash noted. She is not diaphoretic. No erythema.  Psychiatric: She has a normal mood and affect. Her behavior is normal. Judgment and thought content normal.  Nursing note and vitals reviewed.       Assessment & Plan:   Problem List Items Addressed This Visit    None    Visit Diagnoses    Community acquired pneumonia of left lower lobe of  lung (HCC)    -  Primary   Relevant Medications   amoxicillin-clavulanate (AUGMENTIN) 875-125 MG tablet       Ndeye Tenorio is a 83 y.o. female presenting on 12/20/2016 for gradually worsening, painful, productive, cough and chest congestion x 6 days. She reports a fever at home of 101 and today it was 100.3. On exam she has light crackles in the left lingular area. I am concerned for community acquired pneumonia and will  prescribe Augmentin given her allergy to cephalosporins. I instructed her to return if her symptoms do not improve after taking the antibiotic.    Follow up:  Return if symptoms worsen or fail to improve.   Patient seen and examined with Havery Moros medical student. Agree with assessment and plan above Arville Care, MD [facility] 12/20/2016, 10:58 AM

## 2016-12-31 ENCOUNTER — Ambulatory Visit (INDEPENDENT_AMBULATORY_CARE_PROVIDER_SITE_OTHER): Payer: Medicare Other | Admitting: *Deleted

## 2016-12-31 DIAGNOSIS — E538 Deficiency of other specified B group vitamins: Secondary | ICD-10-CM

## 2016-12-31 NOTE — Progress Notes (Signed)
Pt given cyanocobalamin inj Tolerated well 

## 2017-01-07 ENCOUNTER — Other Ambulatory Visit: Payer: Self-pay | Admitting: Family Medicine

## 2017-01-07 DIAGNOSIS — K5900 Constipation, unspecified: Secondary | ICD-10-CM

## 2017-01-08 DIAGNOSIS — L84 Corns and callosities: Secondary | ICD-10-CM | POA: Diagnosis not present

## 2017-01-08 DIAGNOSIS — M79676 Pain in unspecified toe(s): Secondary | ICD-10-CM | POA: Diagnosis not present

## 2017-01-08 DIAGNOSIS — B351 Tinea unguium: Secondary | ICD-10-CM | POA: Diagnosis not present

## 2017-01-08 DIAGNOSIS — I70203 Unspecified atherosclerosis of native arteries of extremities, bilateral legs: Secondary | ICD-10-CM | POA: Diagnosis not present

## 2017-01-09 ENCOUNTER — Ambulatory Visit (INDEPENDENT_AMBULATORY_CARE_PROVIDER_SITE_OTHER): Payer: Medicare Other

## 2017-01-09 DIAGNOSIS — Z23 Encounter for immunization: Secondary | ICD-10-CM | POA: Diagnosis not present

## 2017-01-17 ENCOUNTER — Ambulatory Visit (INDEPENDENT_AMBULATORY_CARE_PROVIDER_SITE_OTHER): Payer: Medicare Other | Admitting: Family Medicine

## 2017-01-17 ENCOUNTER — Encounter: Payer: Self-pay | Admitting: Family Medicine

## 2017-01-17 VITALS — BP 132/59 | HR 50 | Temp 97.9°F | Ht 64.0 in | Wt 213.0 lb

## 2017-01-17 DIAGNOSIS — E559 Vitamin D deficiency, unspecified: Secondary | ICD-10-CM

## 2017-01-17 DIAGNOSIS — K2100 Gastro-esophageal reflux disease with esophagitis, without bleeding: Secondary | ICD-10-CM

## 2017-01-17 DIAGNOSIS — I10 Essential (primary) hypertension: Secondary | ICD-10-CM | POA: Diagnosis not present

## 2017-01-17 DIAGNOSIS — E034 Atrophy of thyroid (acquired): Secondary | ICD-10-CM

## 2017-01-17 DIAGNOSIS — I480 Paroxysmal atrial fibrillation: Secondary | ICD-10-CM

## 2017-01-17 DIAGNOSIS — E538 Deficiency of other specified B group vitamins: Secondary | ICD-10-CM

## 2017-01-17 DIAGNOSIS — E7800 Pure hypercholesterolemia, unspecified: Secondary | ICD-10-CM

## 2017-01-17 DIAGNOSIS — D696 Thrombocytopenia, unspecified: Secondary | ICD-10-CM

## 2017-01-17 DIAGNOSIS — I700 Atherosclerosis of aorta: Secondary | ICD-10-CM

## 2017-01-17 MED ORDER — KLOR-CON 10 10 MEQ PO TBCR: 10.0000 meq | EXTENDED_RELEASE_TABLET | Freq: Every day | ORAL | 3 refills | Status: DC

## 2017-01-17 MED ORDER — OMEPRAZOLE 20 MG PO CPDR: 20.0000 mg | DELAYED_RELEASE_CAPSULE | Freq: Two times a day (BID) | ORAL | 6 refills | Status: DC

## 2017-01-17 MED ORDER — FUROSEMIDE 40 MG PO TABS: 40.0000 mg | ORAL_TABLET | Freq: Every day | ORAL | 3 refills | Status: DC

## 2017-01-17 NOTE — Progress Notes (Signed)
Subjective:    Patient ID: [name], female    DOB: 1932/06/24, 83 y.o.   MRN: [id number]  HPI Pt here for follow up and management of chronic medical problems which includes hyperlipidemia and hypertension. She is taking medication regularly.  Patient today complains of rectal pain with bowel movements.  She says that her stomach and back burn at times.  She complains of constipation and a sense of feeling full.  Patient had an endoscopy 1 year ago and the endoscopy was normal other than a hiatal hernia.  She has had a colonoscopy in the past.  The last one was about 6 or 7 years ago.  He is also had a ultrasound of the pelvis which was unremarkable.  The ultrasound of the abdomen showed an hemangioma in the right hepatic lobe and a cystic structure in the lower pole of the left kidney which is nonspecific.  There is no evidence of gallstones or cholecystitis.  These ultrasounds were done in early 2018.  An x-ray of the LS spine did show some degenerative changes at L4-5 and L5 and S1.  The patient says the rectal pain that she has with bowel movements and sometimes without bowel movements is crampy in nature and sometimes she will have the cramping in her rectal area and not have a bowel movement at all.  She denies any blood in the stool or black tarry bowel movements.  I reviewed her records and did not see where she had a recent colonoscopy but she says she had a colonoscopy at the same time that she had the endoscopy about a year ago and was told that everything was okay.  I will look into that further.  She denies any chest pain.  She does have a little shortness of breath and this is been worse since she had the pneumonia late in the summer.  She denies any trouble with passing her water.  The abdominal and pelvic ultrasounds were reviewed with the patient she was given a copy of those reports.     Patient Active Problem List   Diagnosis Date Noted  . Thrombocytopenia ([place]) 09/14/2016  .  Hemoglobin decreased 09/14/2016  . Hearing loss 01/06/2015  . Thoracic aorta atherosclerosis ([place]) 03/04/2014  . Degenerative arthritis of thoracic spine 03/04/2014  . Osteoporosis, post-menopausal 08/12/2013  . Metabolic syndrome 41/96/2229  . Anemia, iron deficiency 03/16/2013  . Vitamin D deficiency 03/16/2013  . HTN (hypertension) 03/16/2013  . Hypothyroidism   . Diaphragmatic hernia without mention of obstruction or gangrene   . GERD (gastroesophageal reflux disease)   . Hyperlipidemia   . Prolapse of vaginal walls without mention of uterine prolapse   . Arthritis   . First degree atrioventricular block   . Symptomatic menopausal or female climacteric states   . AF (atrial fibrillation) ([place])   . Rectal polyp   . Gastric polyp    Outpatient Encounter Prescriptions as of 01/17/2017  Medication Sig  . albuterol (PROVENTIL HFA;VENTOLIN HFA) 108 (90 Base) MCG/ACT inhaler Inhale 2 puffs into the lungs every 4 (four) hours as needed for wheezing or shortness of breath.  Marland Kitchen aspirin 81 MG chewable tablet Chew 81 mg by mouth daily.  . calcium-vitamin D (OSCAL WITH D) 500-200 MG-UNIT per tablet Take 1 tablet by mouth daily.  . Cholecalciferol (VITAMIN D3) 2000 UNITS TABS Take 1 tablet by mouth daily.    Marland Kitchen escitalopram (LEXAPRO) 10 MG tablet TAKE ONE TABLET BY MOUTH ONCE DAILY  . ferrous  fumarate-iron polysaccharide complex (TANDEM) 162-115.2 MG CAPS capsule Take 1 capsule by mouth daily with breakfast.  . fluticasone (FLONASE) 50 MCG/ACT nasal spray Place 2 sprays into both nostrils daily.  . furosemide (LASIX) 40 MG tablet TAKE 1 TABLET BY MOUTH ONCE DAILY  . gabapentin (NEURONTIN) 100 MG capsule Take 1 capsule (100 mg total) by mouth 2 (two) times daily.  Marland Kitchen KLOR-CON 10 10 MEQ tablet TAKE ONE TABLET BY MOUTH ONCE DAILY  . levothyroxine (SYNTHROID, LEVOTHROID) 137 MCG tablet TAKE ONE TABLET BY MOUTH ONCE DAILY AS DIRECTED  . lisinopril (PRINIVIL,ZESTRIL) 40 MG tablet TAKE 1 TABLET BY  MOUTH ONCE DAILY  . LORazepam (ATIVAN) 0.5 MG tablet One half tablet to one daily if needed for anxiety  . Multiple Vitamins-Minerals (CENTRUM SILVER) tablet Take 1 tablet by mouth daily.   Marland Kitchen omeprazole (PRILOSEC) 20 MG capsule Take 1 capsule (20 mg total) by mouth 2 (two) times daily before a meal.  . polyethylene glycol powder (GLYCOLAX/MIRALAX) powder DISSOLVE 17 GRAMS (ONE CAPFUL) OF POWDER IN WATER & DRINK ONCE DAILY AS NEEDED  . [DISCONTINUED] amoxicillin-clavulanate (AUGMENTIN) 875-125 MG tablet Take 1 tablet by mouth 2 (two) times daily.   Facility-Administered Encounter Medications as of 01/17/2017  Medication  . cyanocobalamin ((VITAMIN B-12)) injection 1,000 mcg     Review of Systems  Constitutional: Negative.   HENT: Negative.   Eyes: Negative.   Respiratory: Negative.   Cardiovascular: Negative.   Gastrointestinal: Positive for constipation and rectal pain (with BM ).       Stomach and back "burns" Feels FULL   Endocrine: Negative.   Genitourinary: Negative.   Musculoskeletal: Negative.   Skin: Negative.   Allergic/Immunologic: Negative.   Neurological: Negative.   Hematological: Negative.   Psychiatric/Behavioral: Negative.        Objective:   Physical Exam  Constitutional: She is oriented to person, place, and time. She appears well-developed and well-nourished. No distress.  Patient is pleasant and relaxed.  HENT:  Head: Normocephalic and atraumatic.  Right Ear: External ear normal.  Left Ear: External ear normal.  Nose: Nose normal.  Mouth/Throat: Oropharynx is clear and moist. No oropharyngeal exudate.  Eyes: Pupils are equal, round, and reactive to light. Conjunctivae and EOM are normal. Right eye exhibits no discharge. Left eye exhibits no discharge. No scleral icterus.  Neck: Normal range of motion. Neck supple. No thyromegaly present.  No bruits thyromegaly or anterior cervical adenopathy  Cardiovascular: Normal rate, regular rhythm and intact distal  pulses.   Murmur heard. Heart is regular at about 60/min with a grade 2/6 systolic ejection murmur  Pulmonary/Chest: Effort normal and breath sounds normal. No respiratory distress. She has no wheezes. She has no rales.  Clear anteriorly and posteriorly  Abdominal: Soft. Bowel sounds are normal. She exhibits no mass. There is tenderness. There is no rebound and no guarding.  Abdominal obesity without masses.  There is slight tenderness in the epigastric area.  Musculoskeletal: Normal range of motion. She exhibits edema.  1+ pedal edema  Lymphadenopathy:    She has no cervical adenopathy.  Neurological: She is alert and oriented to person, place, and time. She has normal reflexes. No cranial nerve deficit.  Skin: Skin is warm and dry. No rash noted.  Psychiatric: She has a normal mood and affect. Her behavior is normal. Judgment and thought content normal.  Nursing note and vitals reviewed.  BP (!) 132/59 (BP Location: Left Arm)   Pulse (!) 50   Temp 97.9 F (36.6  C) (Oral)   Ht '5\' 4"'  (1.626 m)   Wt 213 lb (96.6 kg)   BMI 36.56 kg/m         Assessment & Plan:  1. Hypothyroidism due to acquired atrophy of thyroid -Continue current treatment pending results of lab work - CBC with Differential/Platelet; Future - Thyroid Panel With TSH; Future  2. Vitamin D deficiency -Continue current treatment pending results of lab work - CBC with Differential/Platelet; Future - VITAMIN D 25 Hydroxy (Vit-D Deficiency, Fractures); Future  3. Pure hypercholesterolemia -Continue with as aggressive therapeutic lifestyle changes as possible pending results of lab work - CBC with Differential/Platelet; Future - Lipid panel; Future  4. Thoracic aorta atherosclerosis (HCC) - CBC with Differential/Platelet; Future  5. Gastroesophageal reflux disease with esophagitis -Continue with omeprazole - CBC with Differential/Platelet; Future - omeprazole (PRILOSEC) 20 MG capsule; Take 1 capsule (20 mg  total) by mouth 2 (two) times daily before a meal.  Dispense: 60 capsule; Refill: 6  6. Essential hypertension -Blood pressure is good today and she will continue with her current medication - CBC with Differential/Platelet; Future - BMP8+EGFR; Future - Hepatic function panel; Future - furosemide (LASIX) 40 MG tablet; Take 1 tablet (40 mg total) by mouth daily.  Dispense: 90 tablet; Refill: 3  7. Paroxysmal atrial fibrillation (HCC) -Heart appeared to be in normal sinus rhythm today at about 60/min - CBC with Differential/Platelet; Future  8. Vitamin B 12 deficiency  9. Morbid obesity ([place]) -Continue to make all efforts at losing weight through diet and exercise  10. Thrombocytopenia ([place]) -No indication of any bleeding issues during the visit today.  Meds ordered this encounter  Medications  . furosemide (LASIX) 40 MG tablet    Sig: Take 1 tablet (40 mg total) by mouth daily.    Dispense:  90 tablet    Refill:  3  . omeprazole (PRILOSEC) 20 MG capsule    Sig: Take 1 capsule (20 mg total) by mouth 2 (two) times daily before a meal.    Dispense:  60 capsule    Refill:  6  . KLOR-CON 10 10 MEQ tablet    Sig: Take 1 tablet (10 mEq total) by mouth daily.    Dispense:  90 tablet    Refill:  3   Patient Instructions                       Medicare Annual Wellness Visit  [facility] and the medical providers at [facility] strive to bring you the best medical care.  In doing so we not only want to address your current medical conditions and concerns but also to detect new conditions early and prevent illness, disease and health-related problems.    Medicare offers a yearly Wellness Visit which allows our clinical staff to assess your need for preventative services including immunizations, lifestyle education, counseling to decrease risk of preventable diseases and screening for fall risk and other medical concerns.    This visit is provided free of charge (no  copay) for all Medicare recipients. The clinical pharmacists at [facility] have begun to conduct these Wellness Visits which will also include a thorough review of all your medications.    As you primary medical provider recommend that you make an appointment for your Annual Wellness Visit if you have not done so already this year.  You may set up this appointment before you leave today or you may call back [phone])  and schedule an appointment.  Please make sure when you call that you mention that you are scheduling your Annual Wellness Visit with the clinical pharmacist so that the appointment may be made for the proper length of time.     Continue current medications. Continue good therapeutic lifestyle changes which include good diet and exercise. Fall precautions discussed with patient. If an FOBT was given today- please return it to our front desk. If you are over 81 years old - you may need Prevnar 11 or the adult Pneumonia vaccine.  **Flu shots are available--- please call and schedule a FLU-CLINIC appointment**  After your visit with [place] today you will receive a survey in the mail or online from [employer] regarding your care with [place]. Please take a moment to fill this out. Your feedback is very important to [place] as you can help [place] better understand your patient needs as well as improve your experience and satisfaction. WE CARE ABOUT YOU!!!   Increase water intake We will try reducing the MiraLAX to every other day and see if the rectal cramping just slows with this Continue with the omeprazole regularly Return to the office for fasting lab work  Arrie Senate MD

## 2017-01-17 NOTE — Patient Instructions (Addendum)
Medicare Annual Wellness Visit  [place] and the medical providers at [facility] strive to bring you the best medical care.  In doing so we not only want to address your current medical conditions and concerns but also to detect new conditions early and prevent illness, disease and health-related problems.    Medicare offers a yearly Wellness Visit which allows our clinical staff to assess your need for preventative services including immunizations, lifestyle education, counseling to decrease risk of preventable diseases and screening for fall risk and other medical concerns.    This visit is provided free of charge (no copay) for all Medicare recipients. The clinical pharmacists at [facility] have begun to conduct these Wellness Visits which will also include a thorough review of all your medications.    As you primary medical provider recommend that you make an appointment for your Annual Wellness Visit if you have not done so already this year.  You may set up this appointment before you leave today or you may call back [phone] and schedule an appointment.  Please make sure when you call that you mention that you are scheduling your Annual Wellness Visit with the clinical pharmacist so that the appointment may be made for the proper length of time.     Continue current medications. Continue good therapeutic lifestyle changes which include good diet and exercise. Fall precautions discussed with patient. If an FOBT was given today- please return it to our front desk. If you are over 81 years old - you may need Prevnar 13 or the adult Pneumonia vaccine.  **Flu shots are available--- please call and schedule a FLU-CLINIC appointment**  After your visit with us today you will receive a survey in the mail or online from [employer] regarding your care with us. Please take a moment to fill this out. Your feedback is very  important to us as you can help us better understand your patient needs as well as improve your experience and satisfaction. WE CARE ABOUT YOU!!!   Increase water intake We will try reducing the MiraLAX to every other day and see if the rectal cramping just slows with this Continue with the omeprazole regularly Return to the office for fasting lab work

## 2017-01-18 ENCOUNTER — Other Ambulatory Visit: Payer: Medicare Other

## 2017-01-18 DIAGNOSIS — E559 Vitamin D deficiency, unspecified: Secondary | ICD-10-CM

## 2017-01-18 DIAGNOSIS — K2100 Gastro-esophageal reflux disease with esophagitis, without bleeding: Secondary | ICD-10-CM

## 2017-01-18 DIAGNOSIS — I700 Atherosclerosis of aorta: Secondary | ICD-10-CM | POA: Diagnosis not present

## 2017-01-18 DIAGNOSIS — I10 Essential (primary) hypertension: Secondary | ICD-10-CM

## 2017-01-18 DIAGNOSIS — E7800 Pure hypercholesterolemia, unspecified: Secondary | ICD-10-CM | POA: Diagnosis not present

## 2017-01-18 DIAGNOSIS — E034 Atrophy of thyroid (acquired): Secondary | ICD-10-CM

## 2017-01-18 DIAGNOSIS — I480 Paroxysmal atrial fibrillation: Secondary | ICD-10-CM

## 2017-01-19 LAB — CBC WITH DIFFERENTIAL/PLATELET
BASOS ABS: 0 10*3/uL (ref 0.0–0.2)
Basos: 1 %
EOS (ABSOLUTE): 0.4 10*3/uL (ref 0.0–0.4)
Eos: 10 %
HEMATOCRIT: 33.4 % — AB (ref 34.0–46.6)
HEMOGLOBIN: 10.9 g/dL — AB (ref 11.1–15.9)
IMMATURE GRANS (ABS): 0 10*3/uL (ref 0.0–0.1)
Immature Granulocytes: 0 %
LYMPHS: 20 %
Lymphocytes Absolute: 0.7 10*3/uL (ref 0.7–3.1)
MCH: 30.9 pg (ref 26.6–33.0)
MCHC: 32.6 g/dL (ref 31.5–35.7)
MCV: 95 fL (ref 79–97)
MONOCYTES: 9 %
Monocytes Absolute: 0.3 10*3/uL (ref 0.1–0.9)
NEUTROS ABS: 2.2 10*3/uL (ref 1.4–7.0)
NEUTROS PCT: 60 %
Platelets: 92 10*3/uL — CL (ref 150–379)
RBC: 3.53 x10E6/uL — AB (ref 3.77–5.28)
RDW: 15.2 % (ref 12.3–15.4)
WBC: 3.7 10*3/uL (ref 3.4–10.8)

## 2017-01-19 LAB — LIPID PANEL
CHOL/HDL RATIO: 2.7 ratio (ref 0.0–4.4)
Cholesterol, Total: 156 mg/dL (ref 100–199)
HDL: 57 mg/dL (ref >39)
LDL CALC: 81 mg/dL (ref 0–99)
Triglycerides: 88 mg/dL (ref 0–149)
VLDL Cholesterol Cal: 18 mg/dL (ref 5–40)

## 2017-01-19 LAB — HEPATIC FUNCTION PANEL
ALBUMIN: 4.5 g/dL (ref 3.5–4.7)
ALT: 13 IU/L (ref 0–32)
AST: 24 IU/L (ref 0–40)
Alkaline Phosphatase: 155 IU/L — ABNORMAL HIGH (ref 39–117)
Bilirubin Total: 0.8 mg/dL (ref 0.0–1.2)
Bilirubin, Direct: 0.29 mg/dL (ref 0.00–0.40)
TOTAL PROTEIN: 7 g/dL (ref 6.0–8.5)

## 2017-01-19 LAB — BMP8+EGFR
BUN / CREAT RATIO: 17 (ref 12–28)
BUN: 20 mg/dL (ref 8–27)
CO2: 28 mmol/L (ref 20–29)
CREATININE: 1.16 mg/dL — AB (ref 0.57–1.00)
Calcium: 9.6 mg/dL (ref 8.7–10.3)
Chloride: 98 mmol/L (ref 96–106)
GFR calc Af Amer: 51 mL/min/{1.73_m2} — ABNORMAL LOW (ref >59)
GFR, EST NON AFRICAN AMERICAN: 44 mL/min/{1.73_m2} — AB (ref >59)
GLUCOSE: 97 mg/dL (ref 65–99)
POTASSIUM: 4.2 mmol/L (ref 3.5–5.2)
SODIUM: 142 mmol/L (ref 134–144)

## 2017-01-19 LAB — THYROID PANEL WITH TSH
FREE THYROXINE INDEX: 2.5 (ref 1.2–4.9)
T3 UPTAKE RATIO: 30 % (ref 24–39)
T4 TOTAL: 8.3 ug/dL (ref 4.5–12.0)
TSH: 3.57 u[IU]/mL (ref 0.450–4.500)

## 2017-01-19 LAB — VITAMIN D 25 HYDROXY (VIT D DEFICIENCY, FRACTURES): Vit D, 25-Hydroxy: 60.3 ng/mL (ref 30.0–100.0)

## 2017-01-30 ENCOUNTER — Other Ambulatory Visit: Payer: Self-pay | Admitting: Family Medicine

## 2017-01-30 DIAGNOSIS — I10 Essential (primary) hypertension: Secondary | ICD-10-CM

## 2017-02-01 ENCOUNTER — Ambulatory Visit (INDEPENDENT_AMBULATORY_CARE_PROVIDER_SITE_OTHER): Payer: Medicare Other | Admitting: *Deleted

## 2017-02-01 DIAGNOSIS — E538 Deficiency of other specified B group vitamins: Secondary | ICD-10-CM | POA: Diagnosis not present

## 2017-02-01 NOTE — Progress Notes (Signed)
Pt given cyanocobalamin inj Tolerated well 

## 2017-02-15 ENCOUNTER — Ambulatory Visit (INDEPENDENT_AMBULATORY_CARE_PROVIDER_SITE_OTHER): Payer: Medicare Other | Admitting: Nurse Practitioner

## 2017-02-15 ENCOUNTER — Encounter: Payer: Self-pay | Admitting: Nurse Practitioner

## 2017-02-15 ENCOUNTER — Ambulatory Visit (INDEPENDENT_AMBULATORY_CARE_PROVIDER_SITE_OTHER): Payer: Medicare Other

## 2017-02-15 VITALS — BP 139/64 | HR 58 | Temp 96.9°F | Ht 64.0 in | Wt 213.0 lb

## 2017-02-15 DIAGNOSIS — M25562 Pain in left knee: Secondary | ICD-10-CM | POA: Diagnosis not present

## 2017-02-15 DIAGNOSIS — H40033 Anatomical narrow angle, bilateral: Secondary | ICD-10-CM | POA: Diagnosis not present

## 2017-02-15 DIAGNOSIS — M179 Osteoarthritis of knee, unspecified: Secondary | ICD-10-CM | POA: Diagnosis not present

## 2017-02-15 DIAGNOSIS — M1712 Unilateral primary osteoarthritis, left knee: Secondary | ICD-10-CM | POA: Diagnosis not present

## 2017-02-15 DIAGNOSIS — H2513 Age-related nuclear cataract, bilateral: Secondary | ICD-10-CM | POA: Diagnosis not present

## 2017-02-15 MED ORDER — MELOXICAM 15 MG PO TABS: 15.0000 mg | ORAL_TABLET | Freq: Every day | ORAL | 3 refills | Status: DC

## 2017-02-15 NOTE — Patient Instructions (Signed)

## 2017-02-15 NOTE — Progress Notes (Signed)
   Subjective:    Patient ID: [name] DakinBessie Kook, female    DOB: 05-11-1932, 83 y.o.   MRN: [id number]  HPI Patient comes in today c/o left knee pain. Started yesterday morning and got worse throughout day. Hurts to bend it and raise it upward. She denies any recent injury. She does say that she fell back in august and went dos on that knee and it has been achy every since.    Review of Systems  Respiratory: Negative.   Cardiovascular: Negative.   Musculoskeletal: Positive for arthralgias.  Neurological: Negative.   Psychiatric/Behavioral: Negative.   All other systems reviewed and are negative.      Objective:   Physical Exam  Constitutional: She is oriented to person, place, and time. She appears well-developed and well-nourished. No distress.  Cardiovascular: Normal rate.  Pulmonary/Chest: Effort normal and breath sounds normal.  Musculoskeletal:  Left mild knee effusions FROM of left knee with pain on flexion and extension Crepitus on extension No patella tenderness All ligaments intact Motor strength and sensation distally intact  Neurological: She is alert and oriented to person, place, and time.  Skin: Skin is warm.  Psychiatric: She has a normal mood and affect. Her behavior is normal. Judgment and thought content normal.   BP 139/64   Pulse (!) 58   Temp (!) 96.9 F (36.1 C) (Oral)   Ht 5\' 4"  (1.626 m)   Wt 213 lb (96.6 kg)   BMI 36.56 kg/m   Left knee xray- mild osteoarthritis changes with mild lateral joint space narrowing- Preliminary reading by Paulene FloorMary Marlon Vonruden, FNP  [facility]     Assessment & Plan:  1. Acute pain of left knee - DG Knee 1-2 Views Left; Future  2. Primary osteoarthritis of left knee Meds ordered this encounter  Medications  . meloxicam (MOBIC) 15 MG tablet    Sig: Take 1 tablet (15 mg total) daily by mouth.    Dispense:  30 tablet    Refill:  3    Order Specific Question:   Supervising Provider    Answer:   Johna SheriffVINCENT, CAROL L [[id number]]   Rest   ice Wrap when up walking Keep follow up appointment with Dr. Cristela FeltMoore  Mary-Margaret Marselino Slayton, FNP

## 2017-03-04 ENCOUNTER — Ambulatory Visit (INDEPENDENT_AMBULATORY_CARE_PROVIDER_SITE_OTHER): Payer: Medicare Other | Admitting: *Deleted

## 2017-03-04 DIAGNOSIS — E538 Deficiency of other specified B group vitamins: Secondary | ICD-10-CM

## 2017-03-04 NOTE — Progress Notes (Signed)
Pt given Cyanocobalamin inj Tolerated well 

## 2017-03-17 NOTE — Progress Notes (Signed)
HPI The patient presents for evaluation of syncope. This was probably related to orthostasis. She has atrial fibrillation but had refused anticoagulation.  However, at the last appt she agreed to take Eliquis.  However, she stopped this after she had headaches and nose bleeding.  She also stopped an anxiety med as she thought that this was also related.  She gets around slowly.  The patient denies any new symptoms such as chest discomfort, neck or arm discomfort. There has been no new shortness of breath, PND or orthopnea. There have been no reported palpitations, presyncope or syncope.      Allergies  Allergen Reactions  . Aspirin Nausea Only  . Codeine Nausea Only  . Sulfa Antibiotics   . Vicodin [Hydrocodone-Acetaminophen] Nausea Only  . Warfarin Sodium Other (See Comments)    Tingling all over  . Cephalexin Rash    Red, rash covering lower limbs.    Current Outpatient Medications  Medication Sig Dispense Refill  . albuterol (PROVENTIL HFA;VENTOLIN HFA) 108 (90 Base) MCG/ACT inhaler Inhale 2 puffs into the lungs every 4 (four) hours as needed for wheezing or shortness of breath. 8 g 5  . aspirin 81 MG chewable tablet Chew 81 mg by mouth daily.    . calcium-vitamin D (OSCAL WITH D) 500-200 MG-UNIT per tablet Take 1 tablet by mouth daily.    . Cholecalciferol (VITAMIN D3) 2000 UNITS TABS Take 1 tablet by mouth daily.      Marland Kitchen. escitalopram (LEXAPRO) 10 MG tablet TAKE ONE TABLET BY MOUTH ONCE DAILY 30 tablet 1  . ferrous fumarate-iron polysaccharide complex (TANDEM) 162-115.2 MG CAPS capsule Take 1 capsule by mouth daily with breakfast. 30 capsule 2  . fluticasone (FLONASE) 50 MCG/ACT nasal spray Place 2 sprays into both nostrils daily. 16 g 6  . furosemide (LASIX) 40 MG tablet Take 1 tablet (40 mg total) by mouth daily. 90 tablet 3  . gabapentin (NEURONTIN) 100 MG capsule Take 1 capsule (100 mg total) by mouth 2 (two) times daily. 60 capsule 4  . KLOR-CON 10 10 MEQ tablet Take 1 tablet  (10 mEq total) by mouth daily. 90 tablet 3  . levothyroxine (SYNTHROID, LEVOTHROID) 137 MCG tablet TAKE ONE TABLET BY MOUTH ONCE DAILY AS DIRECTED 90 tablet 2  . lisinopril (PRINIVIL,ZESTRIL) 40 MG tablet TAKE 1 TABLET BY MOUTH ONCE DAILY 90 tablet 0  . LORazepam (ATIVAN) 0.5 MG tablet One half tablet to one daily if needed for anxiety 30 tablet 1  . meloxicam (MOBIC) 15 MG tablet Take 1 tablet (15 mg total) daily by mouth. 30 tablet 3  . Multiple Vitamins-Minerals (CENTRUM SILVER) tablet Take 1 tablet by mouth daily.     Marland Kitchen. omeprazole (PRILOSEC) 20 MG capsule Take 1 capsule (20 mg total) by mouth 2 (two) times daily before a meal. 60 capsule 6  . polyethylene glycol powder (GLYCOLAX/MIRALAX) powder DISSOLVE 17 GRAMS (ONE CAPFUL) OF POWDER IN WATER & DRINK ONCE DAILY AS NEEDED 850 g 1   Current Facility-Administered Medications  Medication Dose Route Frequency Provider Last Rate Last Dose  . cyanocobalamin ((VITAMIN B-12)) injection 1,000 mcg  1,000 mcg Intramuscular Q30 days Ernestina PennaMoore, Donald W, MD   1,000 mcg at 03/04/17 [id number]    Past Medical History:  Diagnosis Date  . AF (atrial fibrillation) (HCC)   . Arthritis   . Diaphragmatic hernia without mention of obstruction or gangrene   . Esophagitis   . First degree atrioventricular block   . Gastric polyp   .  GERD (gastroesophageal reflux disease)   . Heme positive stool   . Hyperlipidemia   . Hypertension   . Hypothyroid   . Prolapse of vaginal walls without mention of uterine prolapse   . Rectal polyp     Past Surgical History:  Procedure Laterality Date  . DILATION AND CURETTAGE OF UTERUS    . KIDNEY STONE SURGERY      ROS:  Decreased hearing.  Otherwise as stated in the HPI and negative for all other systems.  PHYSICAL EXAM BP 139/69   Pulse (!) 54   Ht 5\' 4"  (1.626 m)   Wt 210 lb (95.3 kg)   BMI 36.05 kg/m   GENERAL:  Well appearing NECK:  No jugular venous distention, waveform within normal limits, carotid upstroke  brisk and symmetric, no bruits, no thyromegaly LUNGS:  Clear to auscultation bilaterally CHEST:  Unremarkable HEART:  PMI not displaced or sustained,S1 and S2 within normal limits, no S3,  no clicks, no rubs, apical holosystolic murmur, no diastolic murmurs.    ABD:  Flat, positive bowel sounds normal in frequency in pitch, no bruits, no rebound, no guarding, no midline pulsatile mass, no hepatomegaly, no splenomegaly EXT:  2 plus pulses throughout, moderate leg edema, no cyanosis no clubbing    ASSESSMENT AND PLAN   ATRIAL FIBRILLATION:  She has a CHA2DS2 - VASc score of 4 with a risk of stroke of 4%.   She agrees to restart Eliquis.  I will send a note to Dr. Christell ConstantMoore as she does have chronic anemia and thrombocytopenia.  She has had no recent GI bleeding or other symptoms suggestive of an intolerance to this medication.  SYNCOPE: She is had no further episodes.  No further workup is planned.  MITRAL REGURGITATION:   She had mild MR in 03/2015.  I will follow this clinically.   HTN:  The blood pressure is at target.  She will continue the meds as listed.

## 2017-03-19 DIAGNOSIS — I70203 Unspecified atherosclerosis of native arteries of extremities, bilateral legs: Secondary | ICD-10-CM | POA: Diagnosis not present

## 2017-03-19 DIAGNOSIS — B351 Tinea unguium: Secondary | ICD-10-CM | POA: Diagnosis not present

## 2017-03-19 DIAGNOSIS — L84 Corns and callosities: Secondary | ICD-10-CM | POA: Diagnosis not present

## 2017-03-19 DIAGNOSIS — M79676 Pain in unspecified toe(s): Secondary | ICD-10-CM | POA: Diagnosis not present

## 2017-03-20 ENCOUNTER — Ambulatory Visit (INDEPENDENT_AMBULATORY_CARE_PROVIDER_SITE_OTHER): Payer: Medicare Other | Admitting: Cardiology

## 2017-03-20 ENCOUNTER — Telehealth: Payer: Self-pay | Admitting: *Deleted

## 2017-03-20 ENCOUNTER — Encounter: Payer: Self-pay | Admitting: Cardiology

## 2017-03-20 VITALS — BP 139/69 | HR 54 | Ht 64.0 in | Wt 210.0 lb

## 2017-03-20 DIAGNOSIS — I482 Chronic atrial fibrillation: Secondary | ICD-10-CM | POA: Diagnosis not present

## 2017-03-20 DIAGNOSIS — I4821 Permanent atrial fibrillation: Secondary | ICD-10-CM

## 2017-03-20 DIAGNOSIS — I340 Nonrheumatic mitral (valve) insufficiency: Secondary | ICD-10-CM | POA: Diagnosis not present

## 2017-03-20 DIAGNOSIS — G44219 Episodic tension-type headache, not intractable: Secondary | ICD-10-CM | POA: Diagnosis not present

## 2017-03-20 NOTE — Telephone Encounter (Signed)
-----   Message from Ernestina Pennaonald W Moore, MD sent at 03/20/2017  1:56 PM EST -----  Asher MuirJamie, please put a CBC order in at the next visit or in 4 weeks. ----- Message ----- From: Rollene RotundaHochrein, James, MD Sent: 03/20/2017   1:18 PM To: Ernestina Pennaonald W Moore, MD  Roe Coombson,  I am going to start her back on Eliquis.  Can you keep an eye on her CBC.  Thanks.

## 2017-03-20 NOTE — Telephone Encounter (Signed)
I left pt a detailed VM to come by our lab in 4 weeks non-fasting at her convenience.

## 2017-03-20 NOTE — Patient Instructions (Signed)
Medication Instructions:  Please restart your Eliquis 5 mg twice a day. Discontinue your aspirin. Continue all other medications as listed.  Follow-Up: Follow up in 1 year with Dr. Antoine PocheHochrein.  You will receive a letter in the mail 2 months before you are due.  Please call us when you receive this letter to schedule your follow up appointment.  If you need a refill on your cardiac medications before your next appointment, please call your pharmacy.  Thank you for choosing [facility]!!

## 2017-04-04 ENCOUNTER — Ambulatory Visit (INDEPENDENT_AMBULATORY_CARE_PROVIDER_SITE_OTHER): Payer: Medicare Other | Admitting: *Deleted

## 2017-04-04 ENCOUNTER — Other Ambulatory Visit: Payer: Medicare Other

## 2017-04-04 DIAGNOSIS — E7800 Pure hypercholesterolemia, unspecified: Secondary | ICD-10-CM

## 2017-04-04 DIAGNOSIS — E034 Atrophy of thyroid (acquired): Secondary | ICD-10-CM

## 2017-04-04 DIAGNOSIS — I10 Essential (primary) hypertension: Secondary | ICD-10-CM

## 2017-04-04 DIAGNOSIS — I700 Atherosclerosis of aorta: Secondary | ICD-10-CM

## 2017-04-04 DIAGNOSIS — E538 Deficiency of other specified B group vitamins: Secondary | ICD-10-CM | POA: Diagnosis not present

## 2017-04-04 DIAGNOSIS — E559 Vitamin D deficiency, unspecified: Secondary | ICD-10-CM | POA: Diagnosis not present

## 2017-04-04 DIAGNOSIS — K2100 Gastro-esophageal reflux disease with esophagitis, without bleeding: Secondary | ICD-10-CM

## 2017-04-04 NOTE — Progress Notes (Signed)
Pt given Cyanocobalamin inj Tolerated well 

## 2017-04-05 ENCOUNTER — Other Ambulatory Visit: Payer: Self-pay | Admitting: *Deleted

## 2017-04-05 DIAGNOSIS — R945 Abnormal results of liver function studies: Principal | ICD-10-CM

## 2017-04-05 DIAGNOSIS — R7989 Other specified abnormal findings of blood chemistry: Secondary | ICD-10-CM

## 2017-04-06 LAB — CBC WITH DIFFERENTIAL/PLATELET
BASOS: 1 %
Basophils Absolute: 0 10*3/uL (ref 0.0–0.2)
EOS (ABSOLUTE): 0.2 10*3/uL (ref 0.0–0.4)
EOS: 6 %
HEMOGLOBIN: 11 g/dL — AB (ref 11.1–15.9)
Hematocrit: 33.6 % — ABNORMAL LOW (ref 34.0–46.6)
Immature Grans (Abs): 0 10*3/uL (ref 0.0–0.1)
Immature Granulocytes: 0 %
LYMPHS ABS: 1 10*3/uL (ref 0.7–3.1)
Lymphs: 23 %
MCH: 31 pg (ref 26.6–33.0)
MCHC: 32.7 g/dL (ref 31.5–35.7)
MCV: 95 fL (ref 79–97)
MONOS ABS: 0.4 10*3/uL (ref 0.1–0.9)
Monocytes: 9 %
NEUTROS ABS: 2.6 10*3/uL (ref 1.4–7.0)
Neutrophils: 61 %
PLATELETS: 73 10*3/uL — AB (ref 150–379)
RBC: 3.55 x10E6/uL — ABNORMAL LOW (ref 3.77–5.28)
RDW: 13.4 % (ref 12.3–15.4)
WBC: 4.2 10*3/uL (ref 3.4–10.8)

## 2017-04-06 LAB — BMP8+EGFR
BUN / CREAT RATIO: 18 (ref 12–28)
BUN: 24 mg/dL (ref 8–27)
CO2: 27 mmol/L (ref 20–29)
CREATININE: 1.37 mg/dL — AB (ref 0.57–1.00)
Calcium: 9.5 mg/dL (ref 8.7–10.3)
Chloride: 101 mmol/L (ref 96–106)
GFR calc Af Amer: 41 mL/min/{1.73_m2} — ABNORMAL LOW (ref >59)
GFR, EST NON AFRICAN AMERICAN: 36 mL/min/{1.73_m2} — AB (ref >59)
Glucose: 95 mg/dL (ref 65–99)
POTASSIUM: 4.5 mmol/L (ref 3.5–5.2)
SODIUM: 144 mmol/L (ref 134–144)

## 2017-04-06 LAB — THYROID PANEL WITH TSH
FREE THYROXINE INDEX: 2.4 (ref 1.2–4.9)
T3 Uptake Ratio: 28 % (ref 24–39)
T4, Total: 8.4 ug/dL (ref 4.5–12.0)
TSH: 4.99 u[IU]/mL — AB (ref 0.450–4.500)

## 2017-04-06 LAB — HEPATIC FUNCTION PANEL
ALT: 16 IU/L (ref 0–32)
AST: 24 IU/L (ref 0–40)
Albumin: 4.6 g/dL (ref 3.5–4.7)
Alkaline Phosphatase: 178 IU/L — ABNORMAL HIGH (ref 39–117)
BILIRUBIN, DIRECT: 0.24 mg/dL (ref 0.00–0.40)
Bilirubin Total: 0.6 mg/dL (ref 0.0–1.2)
Total Protein: 7.7 g/dL (ref 6.0–8.5)

## 2017-04-06 LAB — LIPID PANEL
CHOL/HDL RATIO: 2.7 ratio (ref 0.0–4.4)
Cholesterol, Total: 166 mg/dL (ref 100–199)
HDL: 61 mg/dL (ref >39)
LDL CALC: 86 mg/dL (ref 0–99)
Triglycerides: 95 mg/dL (ref 0–149)
VLDL CHOLESTEROL CAL: 19 mg/dL (ref 5–40)

## 2017-04-06 LAB — VITAMIN D 25 HYDROXY (VIT D DEFICIENCY, FRACTURES): VIT D 25 HYDROXY: 72.8 ng/mL (ref 30.0–100.0)

## 2017-04-08 ENCOUNTER — Telehealth: Payer: Self-pay | Admitting: Family Medicine

## 2017-04-09 ENCOUNTER — Encounter: Payer: Self-pay | Admitting: Neurology

## 2017-04-09 ENCOUNTER — Ambulatory Visit (INDEPENDENT_AMBULATORY_CARE_PROVIDER_SITE_OTHER): Payer: Medicare Other | Admitting: Neurology

## 2017-04-09 VITALS — BP 132/64 | HR 65 | Ht 64.0 in | Wt 218.8 lb

## 2017-04-09 DIAGNOSIS — F419 Anxiety disorder, unspecified: Secondary | ICD-10-CM

## 2017-04-09 DIAGNOSIS — G4485 Primary stabbing headache: Secondary | ICD-10-CM

## 2017-04-09 DIAGNOSIS — H903 Sensorineural hearing loss, bilateral: Secondary | ICD-10-CM | POA: Diagnosis not present

## 2017-04-09 DIAGNOSIS — G44209 Tension-type headache, unspecified, not intractable: Secondary | ICD-10-CM

## 2017-04-09 DIAGNOSIS — G250 Essential tremor: Secondary | ICD-10-CM

## 2017-04-09 NOTE — Patient Instructions (Signed)
1.  Continue gabapentin 100mg  at bedtime 2.  Discuss alternative medication to escitalopram for anxiety with Dr. Christell ConstantMoore 3.  Follow up in 6 months.

## 2017-04-09 NOTE — Progress Notes (Signed)
NEUROLOGY FOLLOW UP OFFICE NOTE  [name] [id number]  HISTORY OF PRESENT ILLNESS: [name] is an 84 year old female with atrial fibrillation, hypothyroidism and hypertension who follows up for stabbing/tension-type headache and tremor.  She is accompanied by her daughter who supplements history.     UPDATE: Headache: Intensity:  7/10 Duration:  seconds Frequency:  1 to 2 times a week Frequency of abortive medication: infrequent Current NSAIDS:  no Current analgesics:  Tylenol Current triptans:  no Current anti-emetic:  no Current muscle relaxants:  no Current anti-anxiolytic:  lorazepam Current sleep aide:  no Current Antihypertensive medications:  Lisinopril, furosemide, clonidine Current Antidepressant medications:  escitalopram 10mg  Current Anticonvulsant medications:  Gabapentin 100mg  Current Vitamins/Herbal/Supplements:  no Current Antihistamines/Decongestants:  Alelgra Other therapy:  no   Alcohol:  no Smoker:  no Depression/anxiety:  Lexapro was discontinued due to side effects such as headache and nausea.  She has had increased anxiety.  She is dissatisfied with the senior living apartments where she resides.  She feels agitated   Essential Tremor: Unchanged.   HISTORY:  Headache: Onset:  March 2018.  It has gotten a little better. Location:  Dull pain on top of head; stabbing pain various Quality:  constant dull pain; also paroxysmal stabbing pain Initial Intensity:  constant dull pain 4/10; shooting pain 7-8/10 Aura:  no Prodrome:  no Postdrome:  no Associated symptoms:  No nausea, vomiting, photophobia, phonophobia, visual disturbance, focal numbness or weakness.  She has not had any new worse headache of her life, waking up from sleep Initial Duration:  dull pain constant; shooting pain seconds Initial Frequency:  dull pain constant; shooting pain several times daily Initial Frequency of abortive medication: less than once a  week Triggers/exacerbating factors:  At first, she thought it was side effect of medication, since it correlated with initiation of Eliquis.  Stress may make it worse. Relieving factors:  no Activity:  functions   Past NSAIDS:  Ibuprofen (cannot take due to stomach upset) Past analgesics:  no Past abortive triptans:  no Past muscle relaxants:  no Past anti-emetic:  no Past antihypertensive medications:  no Past antidepressant medications:  no Past anticonvulsant medications:  no Past vitamins/Herbal/Supplements:  no Past antihistamines/decongestants:  no Other past therapies:  no   Family history of headache:  No   TREMOR: She has had tremor in the hands for at least 10 years and has gradually gotten worse.  It started in the left hand and then moved to the right hand.  She is fine at rest.  She notices it when she uses her hand and is disruptive when she writes, uses utensils or holds a book.  She is able to dress without difficulty.  Tremor does not run in the family  PAST MEDICAL HISTORY: Past Medical History:  Diagnosis Date  . AF (atrial fibrillation) (HCC)   . Arthritis   . Diaphragmatic hernia without mention of obstruction or gangrene   . Esophagitis   . First degree atrioventricular block   . Gastric polyp   . GERD (gastroesophageal reflux disease)   . Heme positive stool   . Hyperlipidemia   . Hypertension   . Hypothyroid   . Prolapse of vaginal walls without mention of uterine prolapse   . Rectal polyp     MEDICATIONS: Current Outpatient Medications on File Prior to Visit  Medication Sig Dispense Refill  . albuterol (PROVENTIL HFA;VENTOLIN HFA) 108 (90 Base) MCG/ACT inhaler Inhale 2 puffs into the lungs every  4 (four) hours as needed for wheezing or shortness of breath. 8 g 5  . apixaban (ELIQUIS) 5 MG TABS tablet Take 1 tablet (5 mg total) by mouth 2 (two) times daily. 60 tablet   . calcium-vitamin D (OSCAL WITH D) 500-200 MG-UNIT per tablet Take 1 tablet by  mouth daily.    . Cholecalciferol (VITAMIN D3) 2000 UNITS TABS Take 1 tablet by mouth daily.      Marland Kitchen escitalopram (LEXAPRO) 10 MG tablet TAKE ONE TABLET BY MOUTH ONCE DAILY 30 tablet 1  . ferrous fumarate-iron polysaccharide complex (TANDEM) 162-115.2 MG CAPS capsule Take 1 capsule by mouth daily with breakfast. 30 capsule 2  . fluticasone (FLONASE) 50 MCG/ACT nasal spray Place 2 sprays into both nostrils daily. 16 g 6  . furosemide (LASIX) 40 MG tablet Take 1 tablet (40 mg total) by mouth daily. 90 tablet 3  . gabapentin (NEURONTIN) 100 MG capsule Take 1 capsule (100 mg total) by mouth 2 (two) times daily. 60 capsule 4  . KLOR-CON 10 10 MEQ tablet Take 1 tablet (10 mEq total) by mouth daily. 90 tablet 3  . levothyroxine (SYNTHROID, LEVOTHROID) 137 MCG tablet TAKE ONE TABLET BY MOUTH ONCE DAILY AS DIRECTED 90 tablet 2  . lisinopril (PRINIVIL,ZESTRIL) 40 MG tablet TAKE 1 TABLET BY MOUTH ONCE DAILY 90 tablet 0  . LORazepam (ATIVAN) 0.5 MG tablet One half tablet to one daily if needed for anxiety 30 tablet 1  . meloxicam (MOBIC) 15 MG tablet Take 1 tablet (15 mg total) daily by mouth. 30 tablet 3  . Multiple Vitamins-Minerals (CENTRUM SILVER) tablet Take 1 tablet by mouth daily.     Marland Kitchen omeprazole (PRILOSEC) 20 MG capsule Take 1 capsule (20 mg total) by mouth 2 (two) times daily before a meal. 60 capsule 6  . polyethylene glycol powder (GLYCOLAX/MIRALAX) powder DISSOLVE 17 GRAMS (ONE CAPFUL) OF POWDER IN WATER & DRINK ONCE DAILY AS NEEDED 850 g 1   Current Facility-Administered Medications on File Prior to Visit  Medication Dose Route Frequency Provider Last Rate Last Dose  . cyanocobalamin ((VITAMIN B-12)) injection 1,000 mcg  1,000 mcg Intramuscular Q30 days Ernestina Penna, MD   1,000 mcg at 04/04/17 1023    ALLERGIES: Allergies  Allergen Reactions  . Aspirin Nausea Only  . Codeine Nausea Only  . Sulfa Antibiotics   . Vicodin [Hydrocodone-Acetaminophen] Nausea Only  . Warfarin Sodium Other  (See Comments)    Tingling all over  . Cephalexin Rash    Red, rash covering lower limbs.    FAMILY HISTORY: Family History  Problem Relation Age of Onset  . Hypertension Mother   . Aneurysm Mother        Brain  . Hypertension Father   . Kidney disease Father   . Aneurysm Father        heart  . Arthritis Daughter   . Cancer Daughter        breast  . Hyperlipidemia Sister     SOCIAL HISTORY: Social History   Socioeconomic History  . Marital status: Divorced    Spouse name: Not on file  . Number of children: 2  . Years of education: Not on file  . Highest education level: Not on file  Social Needs  . Financial resource strain: Not on file  . Food insecurity - worry: Not on file  . Food insecurity - inability: Not on file  . Transportation needs - medical: Not on file  . Transportation needs - non-medical: Not  on file  Occupational History  . Occupation: retired    Comment: [employer]  Tobacco Use  . Smoking status: Never Smoker  . Smokeless tobacco: Never Used  Substance and Sexual Activity  . Alcohol use: No  . Drug use: No  . Sexual activity: Not on file  Other Topics Concern  . Not on file  Social History Narrative  . Not on file    REVIEW OF SYSTEMS: Constitutional: No fevers, chills, or sweats, no generalized fatigue, change in appetite Eyes: No visual changes, double vision, eye pain Ear, nose and throat: No hearing loss, ear pain, nasal congestion, sore throat Cardiovascular: No chest pain, palpitations Respiratory:  No shortness of breath at rest or with exertion, wheezes GastrointestinaI: No nausea, vomiting, diarrhea, abdominal pain, fecal incontinence Genitourinary:  No dysuria, urinary retention or frequency Musculoskeletal:  No neck pain, back pain Integumentary: No rash, pruritus, skin lesions Neurological: as above Psychiatric: No depression, insomnia, anxiety Endocrine: No palpitations, fatigue, diaphoresis, mood swings, change in appetite,  change in weight, increased thirst Hematologic/Lymphatic:  No purpura, petechiae. Allergic/Immunologic: no itchy/runny eyes, nasal congestion, recent allergic reactions, rashes  PHYSICAL EXAM: Vitals:   04/09/17 1038  BP: 132/64  Pulse: 65  SpO2: (!) 88%   General: No acute distress.  Patient appears well-groomed.  Head:  Normocephalic/atraumatic Eyes:  Fundi examined but not visualized Neck: supple, no paraspinal tenderness, full range of motion Heart:  Regular rate and rhythm Lungs:  Clear to auscultation bilaterally Back: No paraspinal tenderness Neurological Exam: alert and oriented to person, place, and time. Attention span and concentration intact, recent and remote memory intact, fund of knowledge intact.  Speech fluent and not dysarthric, language intact.  Decreased hearing in left ear.  Otherwise, CN II-XII intact. Bulk and tone normal, muscle strength 5/5 throughout.  Sensation to light touch, temperature and vibration intact.  Deep tendon reflexes 2+ upper extremities, absent lower extremities.  Finger to nose and heel to shin testing intact.  Cautious slow wide-based gait, Romberg negative.  IMPRESSION: Tension type headache improved Primary stabbing headache improved Benign essential tremor, stable Anxiety  PLAN: 1.  Continue gabapentin 100mg  at bedtime 2.  Discuss alternative medication to escitalopram for anxiety with Dr. Christell Constant 3.  Follow up in 6 months.  Shon Millet, DO  CC:  Rudi Heap, MD

## 2017-04-10 ENCOUNTER — Ambulatory Visit (HOSPITAL_COMMUNITY)
Admission: RE | Admit: 2017-04-10 | Discharge: 2017-04-10 | Disposition: A | Discharge status: Home / Self Care | Payer: Medicare Other | Source: Ambulatory Visit | Attending: Family Medicine | Admitting: Family Medicine

## 2017-04-10 DIAGNOSIS — R7989 Other specified abnormal findings of blood chemistry: Secondary | ICD-10-CM

## 2017-04-10 DIAGNOSIS — R945 Abnormal results of liver function studies: Secondary | ICD-10-CM | POA: Insufficient documentation

## 2017-04-11 ENCOUNTER — Other Ambulatory Visit: Payer: Self-pay | Admitting: *Deleted

## 2017-04-11 DIAGNOSIS — R945 Abnormal results of liver function studies: Principal | ICD-10-CM

## 2017-04-11 DIAGNOSIS — R7989 Other specified abnormal findings of blood chemistry: Secondary | ICD-10-CM

## 2017-05-06 ENCOUNTER — Other Ambulatory Visit: Payer: Self-pay | Admitting: Family Medicine

## 2017-05-06 DIAGNOSIS — I10 Essential (primary) hypertension: Secondary | ICD-10-CM

## 2017-05-09 ENCOUNTER — Ambulatory Visit (INDEPENDENT_AMBULATORY_CARE_PROVIDER_SITE_OTHER): Payer: Medicare Other | Admitting: Family Medicine

## 2017-05-09 ENCOUNTER — Ambulatory Visit (INDEPENDENT_AMBULATORY_CARE_PROVIDER_SITE_OTHER): Payer: Medicare Other

## 2017-05-09 ENCOUNTER — Encounter: Payer: Self-pay | Admitting: Family Medicine

## 2017-05-09 VITALS — BP 135/61 | HR 53 | Temp 97.8°F | Ht 64.0 in | Wt 213.0 lb

## 2017-05-09 DIAGNOSIS — E538 Deficiency of other specified B group vitamins: Secondary | ICD-10-CM | POA: Diagnosis not present

## 2017-05-09 DIAGNOSIS — I700 Atherosclerosis of aorta: Secondary | ICD-10-CM

## 2017-05-09 DIAGNOSIS — R0989 Other specified symptoms and signs involving the circulatory and respiratory systems: Secondary | ICD-10-CM | POA: Diagnosis not present

## 2017-05-09 DIAGNOSIS — K2100 Gastro-esophageal reflux disease with esophagitis, without bleeding: Secondary | ICD-10-CM

## 2017-05-09 DIAGNOSIS — E7800 Pure hypercholesterolemia, unspecified: Secondary | ICD-10-CM | POA: Diagnosis not present

## 2017-05-09 DIAGNOSIS — D696 Thrombocytopenia, unspecified: Secondary | ICD-10-CM

## 2017-05-09 DIAGNOSIS — R062 Wheezing: Secondary | ICD-10-CM

## 2017-05-09 DIAGNOSIS — F4323 Adjustment disorder with mixed anxiety and depressed mood: Secondary | ICD-10-CM

## 2017-05-09 DIAGNOSIS — E559 Vitamin D deficiency, unspecified: Secondary | ICD-10-CM | POA: Diagnosis not present

## 2017-05-09 DIAGNOSIS — I480 Paroxysmal atrial fibrillation: Secondary | ICD-10-CM

## 2017-05-09 DIAGNOSIS — E034 Atrophy of thyroid (acquired): Secondary | ICD-10-CM

## 2017-05-09 DIAGNOSIS — I10 Essential (primary) hypertension: Secondary | ICD-10-CM

## 2017-05-09 MED ORDER — PAROXETINE HCL 10 MG PO TABS: 10.0000 mg | ORAL_TABLET | Freq: Every day | ORAL | 1 refills | Status: DC

## 2017-05-09 NOTE — Addendum Note (Signed)
Addended by: Magdalene RiverBULLINS, Tyrees Chopin H on: 05/09/2017 11:58 AM   Modules accepted: Orders

## 2017-05-09 NOTE — Progress Notes (Signed)
Subjective:    Patient ID: [name], female    DOB: 01/25/1933, 84 y.o.   MRN: [id number]  HPI Pt here for follow up and management of chronic medical problems which includes hyperlipidemia, hypothyroid and a fib. She is taking medication regularly.  The patient recently stopped her Lexapro.  Apparently this had upset her stomach and needs something different.  She has had blood work done and this will be reviewed with her during the visit today.  Her TSH was slightly elevated meaning that she may be needing more thyroid medicine.  Since it is only slightly elevated we will plan to repeat this again in a couple of months and if it continues to go higher we will increase her current dose of levothyroxine.  The blood sugar was good at 95.  The creatinine, was more elevated than previously at 1.37.  All of the electrolytes including potassium are good.  CBC had a normal white blood cell count with a stable but slightly decreased hemoglobin at 11.0.  The platelet count remains low at 73 and we will continue to monitor this also.  All cholesterol numbers were good and at goal with traditional lipid testing.  The vitamin D level was excellent at 72.8.  All liver function tests were good except the alkaline phosphatase was slightly more elevated than previously and we will continue to monitor this.  Patient denies any chest pain or shortness of breath.  She says that she knows that the Lexapro did irritate her stomach because she went off of it for a while and then when she went back on it she had more stomach issues.  We will try Paxil instead and I told her if she developed any similar symptoms with her stomach that she should stop taking this.  She denies any nausea vomiting diarrhea blood in the stool or change in bowel habits.  She is passing her water without problems.  She does indicate that she needs to have a root canal done and I think she will be fine with this even though her platelet count is  slightly decreased.    Patient Active Problem List   Diagnosis Date Noted  . Non-rheumatic mitral regurgitation 03/20/2017  . Thrombocytopenia (HCC) 09/14/2016  . Hemoglobin decreased 09/14/2016  . Hearing loss 01/06/2015  . Thoracic aorta atherosclerosis (HCC) 03/04/2014  . Degenerative arthritis of thoracic spine 03/04/2014  . Osteoporosis, post-menopausal 08/12/2013  . Metabolic syndrome 06/23/2013  . Anemia, iron deficiency 03/16/2013  . Vitamin D deficiency 03/16/2013  . HTN (hypertension) 03/16/2013  . Hypothyroidism   . Diaphragmatic hernia without mention of obstruction or gangrene   . GERD (gastroesophageal reflux disease)   . Hyperlipidemia   . Prolapse of vaginal walls without mention of uterine prolapse   . Arthritis   . First degree atrioventricular block   . Symptomatic menopausal or female climacteric states   . AF (atrial fibrillation) (HCC)   . Rectal polyp   . Gastric polyp    Outpatient Encounter Medications as of 05/09/2017  Medication Sig  . albuterol (PROVENTIL HFA;VENTOLIN HFA) 108 (90 Base) MCG/ACT inhaler Inhale 2 puffs into the lungs every 4 (four) hours as needed for wheezing or shortness of breath.  Marland Kitchen apixaban (ELIQUIS) 5 MG TABS tablet Take 1 tablet (5 mg total) by mouth 2 (two) times daily.  . calcium-vitamin D (OSCAL WITH D) 500-200 MG-UNIT per tablet Take 1 tablet by mouth daily.  . Cholecalciferol (VITAMIN D3) 2000 UNITS TABS Take  1 tablet by mouth daily.    . ferrous fumarate-iron polysaccharide complex (TANDEM) 162-115.2 MG CAPS capsule Take 1 capsule by mouth daily with breakfast.  . fluticasone (FLONASE) 50 MCG/ACT nasal spray Place 2 sprays into both nostrils daily.  . furosemide (LASIX) 40 MG tablet Take 1 tablet (40 mg total) by mouth daily.  Marland Kitchen gabapentin (NEURONTIN) 100 MG capsule Take 1 capsule (100 mg total) by mouth 2 (two) times daily.  Marland Kitchen KLOR-CON 10 10 MEQ tablet Take 1 tablet (10 mEq total) by mouth daily.  Marland Kitchen levothyroxine  (SYNTHROID, LEVOTHROID) 137 MCG tablet TAKE ONE TABLET BY MOUTH ONCE DAILY AS DIRECTED  . lisinopril (PRINIVIL,ZESTRIL) 40 MG tablet TAKE 1 TABLET BY MOUTH ONCE DAILY  . LORazepam (ATIVAN) 0.5 MG tablet One half tablet to one daily if needed for anxiety  . meloxicam (MOBIC) 15 MG tablet Take 1 tablet (15 mg total) daily by mouth.  . Multiple Vitamins-Minerals (CENTRUM SILVER) tablet Take 1 tablet by mouth daily.   Marland Kitchen omeprazole (PRILOSEC) 20 MG capsule Take 1 capsule (20 mg total) by mouth 2 (two) times daily before a meal.  . polyethylene glycol powder (GLYCOLAX/MIRALAX) powder DISSOLVE 17 GRAMS (ONE CAPFUL) OF POWDER IN WATER & DRINK ONCE DAILY AS NEEDED  . [DISCONTINUED] escitalopram (LEXAPRO) 10 MG tablet TAKE ONE TABLET BY MOUTH ONCE DAILY   Facility-Administered Encounter Medications as of 05/09/2017  Medication  . cyanocobalamin ((VITAMIN B-12)) injection 1,000 mcg     Review of Systems  Constitutional: Negative.   HENT: Negative.   Eyes: Negative.   Respiratory: Negative.   Cardiovascular: Negative.   Gastrointestinal: Negative.   Endocrine: Negative.   Genitourinary: Negative.   Musculoskeletal: Negative.   Skin: Negative.   Allergic/Immunologic: Negative.   Neurological: Negative.   Hematological: Negative.   Psychiatric/Behavioral: The patient is nervous/anxious (some depression ).        Objective:   Physical Exam  Constitutional: She is oriented to person, place, and time. She appears well-developed and well-nourished. No distress.  Patient is pleasant and relaxed today.  She says she will probably moved back to [facility] because of the apartment so she is living and now is next to the washer dryer area and there are a lot of fumes that seem to be bothering her.  HENT:  Head: Normocephalic and atraumatic.  Right Ear: External ear normal.  Left Ear: External ear normal.  Nose: Nose normal.  Mouth/Throat: Oropharynx is clear and moist. No oropharyngeal exudate.  Eyes:  Conjunctivae and EOM are normal. Pupils are equal, round, and reactive to light. Right eye exhibits no discharge. Left eye exhibits no discharge. No scleral icterus.  Neck: Normal range of motion. Neck supple. No thyromegaly present.  No bruits thyromegaly or anterior cervical  Cardiovascular: Normal rate, regular rhythm, normal heart sounds and intact distal pulses.  No murmur heard. The heart is regular at 60/min  Pulmonary/Chest: Effort normal. No respiratory distress. She has wheezes. She has no rales.  Occasional wheezes left anterior chest otherwise clear anteriorly and posteriorly  Abdominal: Soft. Bowel sounds are normal. She exhibits no mass. There is no tenderness. There is no rebound and no guarding.  Abdominal obesity without masses tenderness or organ enlargement or bruits  Musculoskeletal: Normal range of motion. She exhibits edema. She exhibits no tenderness.  Pedal edema bilaterally.  Patient is wearing support hose but does not put them on the first thing with a rising in the morning.  Lymphadenopathy:    She has no cervical  adenopathy.  Neurological: She is alert and oriented to person, place, and time. She has normal reflexes. No cranial nerve deficit.  Skin: Skin is warm and dry. No rash noted.  Psychiatric: She has a normal mood and affect. Her behavior is normal. Judgment and thought content normal.  Nursing note and vitals reviewed.  BP 135/61 (BP Location: Left Arm)   Pulse (!) 53   Temp 97.8 F (36.6 C) (Oral)   Ht 5\' 4"  (1.626 m)   Wt 213 lb (96.6 kg)   BMI 36.56 kg/m         Assessment & Plan:  1. Hypothyroidism due to acquired atrophy of thyroid -TSH is slightly elevated and we will repeat the thyroid profile in a couple of months to make sure there is no further change and she will continue with current treatment  2. Pure hypercholesterolemia -Cholesterol numbers were good today and this is without medication she will continue with aggressive  therapeutic lifestyle changes  3. Vitamin D deficiency -The vitamin D level is slightly higher than previously and we will reduce the frequency of vitamin D to 1 Monday through Friday and not taking any on Saturday and Sunday.  4. Thoracic aorta atherosclerosis (HCC) -Continue as aggressive therapeutic lifestyle changes as possible with diet and exercise  5. Gastroesophageal reflux disease with esophagitis -Continue with omeprazole  6. Essential hypertension -Blood pressure is good today and she will continue with current treatment  7. Paroxysmal atrial fibrillation (HCC) -Heart had a regular rate and rhythm at 60/min today  8. Morbid obesity (HCC) -Continue to make all efforts to lose weight through diet and exercise  9. Thrombocytopenia (HCC) -The platelet count was low again is slightly lower than previously.  We will continue to monitor this.  10. Adjustment reaction with anxiety and depression -We will try Paxil 10 mg.  The patient understands that if it upsets her stomach she will discontinue this.  Meds ordered this encounter  Medications  . PARoxetine (PAXIL) 10 MG tablet    Sig: Take 1 tablet (10 mg total) by mouth daily.    Dispense:  90 tablet    Refill:  1   Patient Instructions                       Medicare Annual Wellness Visit  [place] and the medical providers at [facility] strive to bring you the best medical care.  In doing so we not only want to address your current medical conditions and concerns but also to detect new conditions early and prevent illness, disease and health-related problems.    Medicare offers a yearly Wellness Visit which allows our clinical staff to assess your need for preventative services including immunizations, lifestyle education, counseling to decrease risk of preventable diseases and screening for fall risk and other medical concerns.    This visit is provided free of charge (no copay) for all  Medicare recipients. The clinical pharmacists at [facility] have begun to conduct these Wellness Visits which will also include a thorough review of all your medications.    As you primary medical provider recommend that you make an appointment for your Annual Wellness Visit if you have not done so already this year.  You may set up this appointment before you leave today or you may call back [phone]) and schedule an appointment.  Please make sure when you call that you mention that you are scheduling your Annual  Wellness Visit with the clinical pharmacist so that the appointment may be made for the proper length of time.     Continue current medications. Continue good therapeutic lifestyle changes which include good diet and exercise. Fall precautions discussed with patient. If an FOBT was given today- please return it to our front desk. If you are over 82 years old - you may need Prevnar 13 or the adult Pneumonia vaccine.  **Flu shots are available--- please call and schedule a FLU-CLINIC appointment**  After your visit with us today you will receive a survey in the mail or online from [employer] regarding your care with us. Please take a moment to fill this out. Your feedback is very important to us as you can help us better understand your patient needs as well as improve your experience and satisfaction. WE CARE ABOUT YOU!!!   The patient was informed that it should be okay to do the root canal but that she should let the dentist know that she has a diminished platelet count.  We will retry a different antidepressant and we will try Paxil and she is aware that if she develops similar symptoms with her stomach that she had with the Lexapro that she should stop this. Continue to drink plenty of water and fluids Walk and exercise regularly Always be careful do not put yourself at risk for falling Recheck thyroid profile in a couple of months  Nyra Capeson W. Moore MD

## 2017-05-09 NOTE — Patient Instructions (Addendum)
Medicare Annual Wellness Visit  [place] and the medical providers at [facility] strive to bring you the best medical care.  In doing so we not only want to address your current medical conditions and concerns but also to detect new conditions early and prevent illness, disease and health-related problems.    Medicare offers a yearly Wellness Visit which allows our clinical staff to assess your need for preventative services including immunizations, lifestyle education, counseling to decrease risk of preventable diseases and screening for fall risk and other medical concerns.    This visit is provided free of charge (no copay) for all Medicare recipients. The clinical pharmacists at [facility] have begun to conduct these Wellness Visits which will also include a thorough review of all your medications.    As you primary medical provider recommend that you make an appointment for your Annual Wellness Visit if you have not done so already this year.  You may set up this appointment before you leave today or you may call back [phone] and schedule an appointment.  Please make sure when you call that you mention that you are scheduling your Annual Wellness Visit with the clinical pharmacist so that the appointment may be made for the proper length of time.     Continue current medications. Continue good therapeutic lifestyle changes which include good diet and exercise. Fall precautions discussed with patient. If an FOBT was given today- please return it to our front desk. If you are over 82 years old - you may need Prevnar 13 or the adult Pneumonia vaccine.  **Flu shots are available--- please call and schedule a FLU-CLINIC appointment**  After your visit with us today you will receive a survey in the mail or online from [employer] regarding your care with us. Please take a moment to fill this out. Your feedback is very  important to us as you can help us better understand your patient needs as well as improve your experience and satisfaction. WE CARE ABOUT YOU!!!   The patient was informed that it should be okay to do the root canal but that she should let the dentist know that she has a diminished platelet count.  We will retry a different antidepressant and we will try Paxil and she is aware that if she develops similar symptoms with her stomach that she had with the Lexapro that she should stop this. Continue to drink plenty of water and fluids Walk and exercise regularly Always be careful do not put yourself at risk for falling Recheck thyroid profile in a couple of months

## 2017-05-20 ENCOUNTER — Ambulatory Visit (INDEPENDENT_AMBULATORY_CARE_PROVIDER_SITE_OTHER): Payer: Medicare Other | Admitting: Nurse Practitioner

## 2017-05-20 ENCOUNTER — Encounter: Payer: Self-pay | Admitting: Nurse Practitioner

## 2017-05-20 VITALS — BP 133/70 | HR 58 | Temp 98.1°F | Ht 64.0 in | Wt 213.0 lb

## 2017-05-20 DIAGNOSIS — J069 Acute upper respiratory infection, unspecified: Secondary | ICD-10-CM | POA: Diagnosis not present

## 2017-05-20 MED ORDER — AZITHROMYCIN 250 MG PO TABS: ORAL_TABLET | ORAL | 0 refills | Status: DC

## 2017-05-20 NOTE — Progress Notes (Signed)
   Subjective:    Patient ID: [name], female    DOB: 09-30-1932, 84 y.o.   MRN: [id number]  HPI  Patient come sin today c/o cough and congestion that started 3 days ago. Has a slight tickle in her throat. She has been taking OTC cough syrup and allegra. She says she this morning her temperature was 99.8. Patient had pneumonia 2 x last year.   Review of Systems  Constitutional: Positive for appetite change (decreased), chills and fever (low grade).  HENT: Positive for congestion, postnasal drip and rhinorrhea. Negative for sinus pressure, sinus pain, sore throat, trouble swallowing and voice change.   Respiratory: Positive for cough.   Cardiovascular: Negative.   Gastrointestinal: Negative.   Neurological: Negative for headaches.  Psychiatric/Behavioral: Negative.   All other systems reviewed and are negative.      Objective:   Physical Exam  Constitutional: She is oriented to person, place, and time. She appears well-developed and well-nourished. She appears distressed (mild).  HENT:  Right Ear: Tympanic membrane, external ear and ear canal normal.  Left Ear: Hearing, tympanic membrane, external ear and ear canal normal.  Nose: Mucosal edema and rhinorrhea present. Right sinus exhibits no maxillary sinus tenderness and no frontal sinus tenderness. Left sinus exhibits no maxillary sinus tenderness and no frontal sinus tenderness.  Mouth/Throat: Uvula is midline, oropharynx is clear and moist and mucous membranes are normal.  Neck: Normal range of motion. Neck supple.  Pulmonary/Chest: Effort normal and breath sounds normal.  Abdominal: Soft. Bowel sounds are normal.  Lymphadenopathy:    She has no cervical adenopathy.  Neurological: She is alert and oriented to person, place, and time. No cranial nerve deficit.  Skin: Skin is dry.  Psychiatric: She has a normal mood and affect. Her behavior is normal. Judgment and thought content normal.   BP 133/70   Pulse (!) 58   Temp  98.1 F (36.7 C) (Oral)   Ht 5\' 4"  (1.626 m)   Wt 213 lb (96.6 kg)   BMI 36.56 kg/m       Assessment & Plan:   1. Upper respiratory infection with cough and congestion    Meds ordered this encounter  Medications  . azithromycin (ZITHROMAX Z-PAK) 250 MG tablet    Sig: As directed    Dispense:  6 tablet    Refill:  0    Order Specific Question:   Supervising Provider    Answer:   VINCENT, CAROL L [[id number]]   1. Take meds as prescribed 2. Use a cool mist humidifier especially during the winter months and when heat has been humid. 3. Use saline nose sprays frequently 4. Saline irrigations of the nose can be very helpful if done frequently.  * 4X daily for 1 week*  * Use of a nettie pot can be helpful with this. Follow directions with this* 5. Drink plenty of fluids 6. Keep thermostat turn down low 7.For any cough or congestion  Use plain Mucinex- regular strength or max strength is fine   * Children- consult with Pharmacist for dosing 8. For fever or aces or pains- take tylenol or ibuprofen appropriate for age and weight.  * for fevers greater than 101 orally you may alternate ibuprofen and tylenol every  3 hours.   Mary-Margaret Daphine DeutscherMartin, FNP

## 2017-05-20 NOTE — Patient Instructions (Signed)

## 2017-05-28 ENCOUNTER — Ambulatory Visit (INDEPENDENT_AMBULATORY_CARE_PROVIDER_SITE_OTHER): Payer: Medicare Other | Admitting: Family Medicine

## 2017-05-28 ENCOUNTER — Ambulatory Visit (INDEPENDENT_AMBULATORY_CARE_PROVIDER_SITE_OTHER): Payer: Medicare Other

## 2017-05-28 VITALS — BP 141/60 | HR 56 | Temp 98.3°F | Ht 64.0 in | Wt 212.0 lb

## 2017-05-28 DIAGNOSIS — J189 Pneumonia, unspecified organism: Secondary | ICD-10-CM

## 2017-05-28 DIAGNOSIS — R05 Cough: Secondary | ICD-10-CM

## 2017-05-28 DIAGNOSIS — L84 Corns and callosities: Secondary | ICD-10-CM | POA: Diagnosis not present

## 2017-05-28 DIAGNOSIS — R058 Other specified cough: Secondary | ICD-10-CM

## 2017-05-28 DIAGNOSIS — I70203 Unspecified atherosclerosis of native arteries of extremities, bilateral legs: Secondary | ICD-10-CM | POA: Diagnosis not present

## 2017-05-28 DIAGNOSIS — M79676 Pain in unspecified toe(s): Secondary | ICD-10-CM | POA: Diagnosis not present

## 2017-05-28 DIAGNOSIS — B351 Tinea unguium: Secondary | ICD-10-CM | POA: Diagnosis not present

## 2017-05-28 MED ORDER — METHYLPREDNISOLONE ACETATE 80 MG/ML IJ SUSP
80.0000 mg | Freq: Once | INTRAMUSCULAR | Status: AC
  Administered 2017-05-28: 80 mg via INTRAMUSCULAR

## 2017-05-28 MED ORDER — HYDROCODONE-HOMATROPINE 5-1.5 MG/5ML PO SYRP: 5.0000 mL | ORAL_SOLUTION | Freq: Three times a day (TID) | ORAL | 0 refills | Status: DC | PRN

## 2017-05-28 MED ORDER — ALBUTEROL SULFATE HFA 108 (90 BASE) MCG/ACT IN AERS: 2.0000 | INHALATION_SPRAY | Freq: Four times a day (QID) | RESPIRATORY_TRACT | 0 refills | Status: DC | PRN

## 2017-05-28 NOTE — Progress Notes (Signed)
Subjective: CC: cough PCP: Ernestina Penna, MD [name] is a 84 y.o. female presenting to clinic today for:  1. Cough Patient was seen on 05/20/2017 for same.  She was treated with azithromycin.  She actually had a chest x-ray done on 05/09/2017 which did demonstrate mild pulmonary vascular congestion but no pulmonary edema or acute pulmonary infiltrates.  Today she presents to office and notes that cough never really resolved.  She notes that she continues to have a productive cough with brown sputum.  She did complete the azithromycin as directed.  She has been using old Hycodan syrup with some improvement in the cough but it always returns.  She notes that she had a fever to 101.8 F on Thursday of last week.  Denies shortness of breath but was told during 2 previous visits that she has wheezes.  She is not been using her albuterol inhaler but does have it at home.  Denies increased lower extremity edema but does have chronic lower extremity edema, which she uses compression hose for.  She has not applied these today because she was not feeling well.  Denies shortness of breath, chest pain, racing heart.  Reports chest congestion.   ROS: Per HPI  Allergies  Allergen Reactions  . Aspirin Nausea Only  . Codeine Nausea Only  . Sulfa Antibiotics   . Vicodin [Hydrocodone-Acetaminophen] Nausea Only  . Warfarin Sodium Other (See Comments)    Tingling all over  . Cephalexin Rash    Red, rash covering lower limbs.   Past Medical History:  Diagnosis Date  . AF (atrial fibrillation) (HCC)   . Arthritis   . Diaphragmatic hernia without mention of obstruction or gangrene   . Esophagitis   . First degree atrioventricular block   . Gastric polyp   . GERD (gastroesophageal reflux disease)   . Heme positive stool   . Hyperlipidemia   . Hypertension   . Hypothyroid   . Prolapse of vaginal walls without mention of uterine prolapse   . Rectal polyp     Current Outpatient  Medications:  .  albuterol (PROVENTIL HFA;VENTOLIN HFA) 108 (90 Base) MCG/ACT inhaler, Inhale 2 puffs into the lungs every 4 (four) hours as needed for wheezing or shortness of breath., Disp: 8 g, Rfl: 5 .  apixaban (ELIQUIS) 5 MG TABS tablet, Take 1 tablet (5 mg total) by mouth 2 (two) times daily., Disp: 60 tablet, Rfl:  .  azithromycin (ZITHROMAX Z-PAK) 250 MG tablet, As directed, Disp: 6 tablet, Rfl: 0 .  calcium-vitamin D (OSCAL WITH D) 500-200 MG-UNIT per tablet, Take 1 tablet by mouth daily., Disp: , Rfl:  .  Cholecalciferol (VITAMIN D3) 2000 UNITS TABS, Take 1 tablet by mouth daily.  , Disp: , Rfl:  .  ferrous fumarate-iron polysaccharide complex (TANDEM) 162-115.2 MG CAPS capsule, Take 1 capsule by mouth daily with breakfast., Disp: 30 capsule, Rfl: 2 .  fluticasone (FLONASE) 50 MCG/ACT nasal spray, Place 2 sprays into both nostrils daily., Disp: 16 g, Rfl: 6 .  furosemide (LASIX) 40 MG tablet, Take 1 tablet (40 mg total) by mouth daily., Disp: 90 tablet, Rfl: 3 .  gabapentin (NEURONTIN) 100 MG capsule, Take 1 capsule (100 mg total) by mouth 2 (two) times daily., Disp: 60 capsule, Rfl: 4 .  KLOR-CON 10 10 MEQ tablet, Take 1 tablet (10 mEq total) by mouth daily., Disp: 90 tablet, Rfl: 3 .  levothyroxine (SYNTHROID, LEVOTHROID) 137 MCG tablet, TAKE ONE TABLET BY MOUTH ONCE DAILY  AS DIRECTED, Disp: 90 tablet, Rfl: 2 .  lisinopril (PRINIVIL,ZESTRIL) 40 MG tablet, TAKE 1 TABLET BY MOUTH ONCE DAILY, Disp: 90 tablet, Rfl: 0 .  LORazepam (ATIVAN) 0.5 MG tablet, One half tablet to one daily if needed for anxiety, Disp: 30 tablet, Rfl: 1 .  meloxicam (MOBIC) 15 MG tablet, Take 1 tablet (15 mg total) daily by mouth., Disp: 30 tablet, Rfl: 3 .  Multiple Vitamins-Minerals (CENTRUM SILVER) tablet, Take 1 tablet by mouth daily. , Disp: , Rfl:  .  omeprazole (PRILOSEC) 20 MG capsule, Take 1 capsule (20 mg total) by mouth 2 (two) times daily before a meal., Disp: 60 capsule, Rfl: 6 .  PARoxetine (PAXIL) 10  MG tablet, Take 1 tablet (10 mg total) by mouth daily., Disp: 90 tablet, Rfl: 1 .  polyethylene glycol powder (GLYCOLAX/MIRALAX) powder, DISSOLVE 17 GRAMS (ONE CAPFUL) OF POWDER IN WATER & DRINK ONCE DAILY AS NEEDED, Disp: 850 g, Rfl: 1  Current Facility-Administered Medications:  .  cyanocobalamin ((VITAMIN B-12)) injection 1,000 mcg, 1,000 mcg, Intramuscular, Q30 days, Ernestina Penna, MD, 1,000 mcg at 05/09/17 1156 Social History   Socioeconomic History  . Marital status: Divorced    Spouse name: Not on file  . Number of children: 2  . Years of education: Not on file  . Highest education level: Not on file  Social Needs  . Financial resource strain: Not on file  . Food insecurity - worry: Not on file  . Food insecurity - inability: Not on file  . Transportation needs - medical: Not on file  . Transportation needs - non-medical: Not on file  Occupational History  . Occupation: retired    Comment: [employer]  Tobacco Use  . Smoking status: Never Smoker  . Smokeless tobacco: Never Used  Substance and Sexual Activity  . Alcohol use: No  . Drug use: No  . Sexual activity: Not on file  Other Topics Concern  . Not on file  Social History Narrative  . Not on file   Family History  Problem Relation Age of Onset  . Hypertension Mother   . Aneurysm Mother        Brain  . Hypertension Father   . Kidney disease Father   . Aneurysm Father        heart  . Arthritis Daughter   . Cancer Daughter        breast  . Hyperlipidemia Sister     Objective: Office vital signs reviewed. BP (!) 141/60   Pulse (!) 56   Temp 98.3 F (36.8 C) (Oral)   Ht 5\' 4"  (1.626 m)   Wt 212 lb (96.2 kg)   SpO2 99%   BMI 36.39 kg/m   Physical Examination:  General: Awake, alert, well nourished, No acute distress HEENT: MMM, sclera white, no rhinorrhea. No JVD. Cardio: irregularly irregular. S1S2 heard, no murmurs appreciated Pulm: Slight decrease breath sounds in her right lung fields compared to  left.  No wheezes, rhonchi or rales; normal work of breathing on room air Ext: 1+ LE to ankles bilaterally.   Assessment/ Plan: 84 y.o. female   1. Productive cough Patient with normal work of breathing on room air.  Her pulse ox on room air is 99%.  Her lung exam was remarkable for slight decreased breath sounds on the right lung fields compared to the left.  No apparent wheezes, rhonchi or rale.  I reviewed her chest x-ray report from 05/09/2017 which did demonstrate mild vascular congestion without edema.  I also reviewed her 05/20/2017 office visit during which point she was prescribed azithromycin.  Her cough is refractory to both oral antibiotics and cough syrup.  For this reason, I repeated her chest x-ray which appeared unchanged from previous x-ray on 05/09/2017.  As there is no evidence of pneumonia, I have treated her with a dose of Depo-Medrol 80 IM and renewed her cough syrup.  The narcotic database was reviewed and she apparently has not had this filled in some time.  No red flags.  Caution sedation.  Additionally, I did instruct her to start using her albuterol inhaler 2 puffs every 6 hours for the next 2 days then she may use it as needed as directed.  She will follow-up with her PCP in 2 weeks for recheck. Strict return precautions and reasons for emergent evaluation in the emergency department review with patient.  She voiced understanding and will follow-up as needed. - DG Chest 2 View; Future   Orders Placed This Encounter  Procedures  . DG Chest 2 View    Standing Status:   Future    Number of Occurrences:   1    Standing Expiration Date:   07/27/2018    Order Specific Question:   Reason for Exam (SYMPTOM  OR DIAGNOSIS REQUIRED)    Answer:   persistant productive cough. decreased breath sounds on left.    Order Specific Question:   Preferred imaging location?    Answer:   Internal    Order Specific Question:   Radiology Contrast Protocol - do NOT remove file path    Answer:    [email or web address]   Meds ordered this encounter  Medications  . methylPREDNISolone acetate (DEPO-MEDROL) injection 80 mg  . HYDROcodone-homatropine (HYCODAN) 5-1.5 MG/5ML syrup    Sig: Take 5 mLs by mouth every 8 (eight) hours as needed for cough.    Dispense:  100 mL    Refill:  0  . albuterol (PROVENTIL HFA;VENTOLIN HFA) 108 (90 Base) MCG/ACT inhaler    Sig: Inhale 2 puffs into the lungs every 6 (six) hours as needed for wheezing or shortness of breath.    Dispense:  8 g    Refill:  0     Ashly Hulen SkainsM Gottschalk, DO [facility] [phone]

## 2017-06-07 ENCOUNTER — Ambulatory Visit (INDEPENDENT_AMBULATORY_CARE_PROVIDER_SITE_OTHER): Payer: Medicare Other | Admitting: *Deleted

## 2017-06-07 DIAGNOSIS — E538 Deficiency of other specified B group vitamins: Secondary | ICD-10-CM

## 2017-06-07 NOTE — Progress Notes (Signed)
Pt given Cyanocobalamin inj Tolerated well 

## 2017-06-20 ENCOUNTER — Other Ambulatory Visit: Payer: Self-pay | Admitting: Cardiology

## 2017-06-24 DIAGNOSIS — Z1231 Encounter for screening mammogram for malignant neoplasm of breast: Secondary | ICD-10-CM | POA: Diagnosis not present

## 2017-06-24 LAB — HM MAMMOGRAPHY

## 2017-07-09 ENCOUNTER — Ambulatory Visit (INDEPENDENT_AMBULATORY_CARE_PROVIDER_SITE_OTHER): Payer: Medicare Other | Admitting: *Deleted

## 2017-07-09 DIAGNOSIS — R945 Abnormal results of liver function studies: Secondary | ICD-10-CM

## 2017-07-09 DIAGNOSIS — E034 Atrophy of thyroid (acquired): Secondary | ICD-10-CM

## 2017-07-09 DIAGNOSIS — Z1211 Encounter for screening for malignant neoplasm of colon: Secondary | ICD-10-CM

## 2017-07-09 DIAGNOSIS — E538 Deficiency of other specified B group vitamins: Secondary | ICD-10-CM

## 2017-07-09 DIAGNOSIS — R7989 Other specified abnormal findings of blood chemistry: Secondary | ICD-10-CM

## 2017-07-09 NOTE — Progress Notes (Signed)
Pt given Cyanocobalamin inj Tolerated well 

## 2017-07-09 NOTE — Addendum Note (Signed)
Addended by: Magdalene RiverBULLINS, Viet Kemmerer H on: 07/09/2017 04:53 PM   Modules accepted: Orders

## 2017-07-10 LAB — THYROID PANEL WITH TSH
FREE THYROXINE INDEX: 2.4 (ref 1.2–4.9)
T3 Uptake Ratio: 28 % (ref 24–39)
T4, Total: 8.4 ug/dL (ref 4.5–12.0)
TSH: 1.53 u[IU]/mL (ref 0.450–4.500)

## 2017-07-10 LAB — HEPATIC FUNCTION PANEL
ALK PHOS: 138 IU/L — AB (ref 39–117)
ALT: 18 IU/L (ref 0–32)
AST: 24 IU/L (ref 0–40)
Albumin: 4.3 g/dL (ref 3.5–4.7)
BILIRUBIN TOTAL: 0.5 mg/dL (ref 0.0–1.2)
BILIRUBIN, DIRECT: 0.17 mg/dL (ref 0.00–0.40)
TOTAL PROTEIN: 6.8 g/dL (ref 6.0–8.5)

## 2017-07-10 LAB — FECAL OCCULT BLOOD, IMMUNOCHEMICAL: FECAL OCCULT BLD: NEGATIVE

## 2017-07-29 ENCOUNTER — Encounter: Payer: Self-pay | Admitting: Neurology

## 2017-08-06 DIAGNOSIS — M79676 Pain in unspecified toe(s): Secondary | ICD-10-CM | POA: Diagnosis not present

## 2017-08-06 DIAGNOSIS — I70203 Unspecified atherosclerosis of native arteries of extremities, bilateral legs: Secondary | ICD-10-CM | POA: Diagnosis not present

## 2017-08-06 DIAGNOSIS — B351 Tinea unguium: Secondary | ICD-10-CM | POA: Diagnosis not present

## 2017-08-06 DIAGNOSIS — L84 Corns and callosities: Secondary | ICD-10-CM | POA: Diagnosis not present

## 2017-08-08 ENCOUNTER — Ambulatory Visit (INDEPENDENT_AMBULATORY_CARE_PROVIDER_SITE_OTHER): Payer: Medicare Other | Admitting: *Deleted

## 2017-08-08 DIAGNOSIS — E538 Deficiency of other specified B group vitamins: Secondary | ICD-10-CM | POA: Diagnosis not present

## 2017-08-08 NOTE — Progress Notes (Signed)
Pt given Cyanocobalamin inj Tolerated well 

## 2017-08-09 ENCOUNTER — Other Ambulatory Visit: Payer: Self-pay | Admitting: Family Medicine

## 2017-08-09 DIAGNOSIS — I10 Essential (primary) hypertension: Secondary | ICD-10-CM

## 2017-08-19 ENCOUNTER — Other Ambulatory Visit: Payer: Self-pay | Admitting: Family Medicine

## 2017-08-19 ENCOUNTER — Other Ambulatory Visit: Payer: Self-pay | Admitting: *Deleted

## 2017-08-19 DIAGNOSIS — I10 Essential (primary) hypertension: Secondary | ICD-10-CM

## 2017-08-19 MED ORDER — LISINOPRIL 40 MG PO TABS: 40.0000 mg | ORAL_TABLET | Freq: Every day | ORAL | 0 refills | Status: DC

## 2017-08-19 NOTE — Telephone Encounter (Signed)
Fax received [facility] [place] Pt has misplaced 90 day Lisinopril Refill sent to pharmacy

## 2017-09-09 ENCOUNTER — Ambulatory Visit (INDEPENDENT_AMBULATORY_CARE_PROVIDER_SITE_OTHER): Payer: Medicare Other | Admitting: *Deleted

## 2017-09-09 DIAGNOSIS — E538 Deficiency of other specified B group vitamins: Secondary | ICD-10-CM | POA: Diagnosis not present

## 2017-09-09 NOTE — Progress Notes (Signed)
Pt given Cyanocobalamin inj Tolerated well 

## 2017-09-11 ENCOUNTER — Other Ambulatory Visit: Payer: Self-pay

## 2017-09-11 ENCOUNTER — Ambulatory Visit: Payer: Medicare Other | Admitting: Family Medicine

## 2017-09-11 MED ORDER — GABAPENTIN 100 MG PO CAPS: 100.0000 mg | ORAL_CAPSULE | Freq: Two times a day (BID) | ORAL | 1 refills | Status: DC

## 2017-09-27 ENCOUNTER — Other Ambulatory Visit: Payer: Self-pay | Admitting: Family Medicine

## 2017-09-27 DIAGNOSIS — K2100 Gastro-esophageal reflux disease with esophagitis, without bleeding: Secondary | ICD-10-CM

## 2017-10-01 ENCOUNTER — Encounter: Payer: Self-pay | Admitting: Family Medicine

## 2017-10-01 ENCOUNTER — Ambulatory Visit (INDEPENDENT_AMBULATORY_CARE_PROVIDER_SITE_OTHER): Payer: Medicare Other | Admitting: Family Medicine

## 2017-10-01 VITALS — BP 133/57 | HR 45 | Temp 97.6°F | Ht 64.0 in | Wt 212.0 lb

## 2017-10-01 DIAGNOSIS — I480 Paroxysmal atrial fibrillation: Secondary | ICD-10-CM | POA: Diagnosis not present

## 2017-10-01 DIAGNOSIS — D696 Thrombocytopenia, unspecified: Secondary | ICD-10-CM | POA: Diagnosis not present

## 2017-10-01 DIAGNOSIS — E034 Atrophy of thyroid (acquired): Secondary | ICD-10-CM | POA: Diagnosis not present

## 2017-10-01 DIAGNOSIS — I700 Atherosclerosis of aorta: Secondary | ICD-10-CM

## 2017-10-01 DIAGNOSIS — E7800 Pure hypercholesterolemia, unspecified: Secondary | ICD-10-CM | POA: Diagnosis not present

## 2017-10-01 DIAGNOSIS — E559 Vitamin D deficiency, unspecified: Secondary | ICD-10-CM | POA: Diagnosis not present

## 2017-10-01 DIAGNOSIS — K2100 Gastro-esophageal reflux disease with esophagitis, without bleeding: Secondary | ICD-10-CM

## 2017-10-01 DIAGNOSIS — I10 Essential (primary) hypertension: Secondary | ICD-10-CM | POA: Diagnosis not present

## 2017-10-01 DIAGNOSIS — D692 Other nonthrombocytopenic purpura: Secondary | ICD-10-CM

## 2017-10-01 DIAGNOSIS — R600 Localized edema: Secondary | ICD-10-CM

## 2017-10-01 DIAGNOSIS — E538 Deficiency of other specified B group vitamins: Secondary | ICD-10-CM

## 2017-10-01 DIAGNOSIS — F4323 Adjustment disorder with mixed anxiety and depressed mood: Secondary | ICD-10-CM

## 2017-10-01 MED ORDER — LISINOPRIL 40 MG PO TABS: 40.0000 mg | ORAL_TABLET | Freq: Every day | ORAL | 3 refills | Status: DC

## 2017-10-01 NOTE — Progress Notes (Signed)
Subjective:    Patient ID: [name], female    DOB: 01-04-1933, 84 y.o.   MRN: [id number]  HPI Pt here for follow up and management of chronic medical problems which includes hypothyroid, hypertension and hyperlipidemia. She is taking medication regularly.  She is doing well today with no specific complaints.  Her vital signs are stable and her BMI is 36.39.  She does have more than 2 comorbid conditions and this would put her in the morbid obesity category.  She is requesting a refill on her lisinopril.  Today is worried about her daughter who has been retaining fluid and is seeing a cardiologist and hopefully things will get under control with cardiology referral.  The patient herself denies any chest pain pressure tightness or shortness of breath.  She is active.  She denies any trouble with swallowing heartburn indigestion nausea vomiting diarrhea or blood in the stool.  She is passing her water without problems.  She is up-to-date on her eye exams.  She says that her breast checks are normal.  She sleeps in a recliner because she feels better in the recliner versus sleeping in the bed and I encouraged her to try to prop her feet up more even though she is wearing support hose.    Patient Active Problem List   Diagnosis Date Noted  . Non-rheumatic mitral regurgitation 03/20/2017  . Thrombocytopenia ([place]) 09/14/2016  . Hemoglobin decreased 09/14/2016  . Hearing loss 01/06/2015  . Thoracic aorta atherosclerosis ([place]) 03/04/2014  . Degenerative arthritis of thoracic spine 03/04/2014  . Osteoporosis, post-menopausal 08/12/2013  . Metabolic syndrome 33/54/5625  . Anemia, iron deficiency 03/16/2013  . Vitamin D deficiency 03/16/2013  . HTN (hypertension) 03/16/2013  . Hypothyroidism   . Diaphragmatic hernia without mention of obstruction or gangrene   . GERD (gastroesophageal reflux disease)   . Hyperlipidemia   . Prolapse of vaginal walls without mention of uterine prolapse   .  Arthritis   . First degree atrioventricular block   . Symptomatic menopausal or female climacteric states   . AF (atrial fibrillation) ([place])   . Rectal polyp   . Gastric polyp    Outpatient Encounter Medications as of 10/01/2017  Medication Sig  . albuterol (PROVENTIL HFA;VENTOLIN HFA) 108 (90 Base) MCG/ACT inhaler Inhale 2 puffs into the lungs every 6 (six) hours as needed for wheezing or shortness of breath.  . calcium-vitamin D (OSCAL WITH D) 500-200 MG-UNIT per tablet Take 1 tablet by mouth daily.  . Cholecalciferol (VITAMIN D3) 2000 UNITS TABS Take 1 tablet by mouth daily.    Marland Kitchen ELIQUIS 5 MG TABS tablet TAKE 1 TABLET BY MOUTH TWICE DAILY  . ferrous fumarate-iron polysaccharide complex (TANDEM) 162-115.2 MG CAPS capsule Take 1 capsule by mouth daily with breakfast.  . fluticasone (FLONASE) 50 MCG/ACT nasal spray Place 2 sprays into both nostrils daily.  . furosemide (LASIX) 40 MG tablet Take 1 tablet (40 mg total) by mouth daily.  Marland Kitchen gabapentin (NEURONTIN) 100 MG capsule Take 1 capsule (100 mg total) by mouth 2 (two) times daily.  Marland Kitchen KLOR-CON 10 10 MEQ tablet Take 1 tablet (10 mEq total) by mouth daily.  Marland Kitchen levothyroxine (SYNTHROID, LEVOTHROID) 137 MCG tablet TAKE 1 TABLET BY MOUTH ONCE DAILY AS  DIRECTED  . lisinopril (PRINIVIL,ZESTRIL) 40 MG tablet Take 1 tablet (40 mg total) by mouth daily.  Marland Kitchen LORazepam (ATIVAN) 0.5 MG tablet One half tablet to one daily if needed for anxiety  . meloxicam (MOBIC) 15 MG tablet  Take 1 tablet (15 mg total) daily by mouth.  . Multiple Vitamins-Minerals (CENTRUM SILVER) tablet Take 1 tablet by mouth daily.   Marland Kitchen omeprazole (PRILOSEC) 20 MG capsule TAKE 1 CAPSULE BY MOUTH TWICE DAILY BEFORE  A  MEAL  . PARoxetine (PAXIL) 10 MG tablet Take 1 tablet (10 mg total) by mouth daily.  . polyethylene glycol powder (GLYCOLAX/MIRALAX) powder DISSOLVE 17 GRAMS (ONE CAPFUL) OF POWDER IN WATER & DRINK ONCE DAILY AS NEEDED  . [DISCONTINUED] apixaban (ELIQUIS) 5 MG TABS tablet  Take 1 tablet (5 mg total) by mouth 2 (two) times daily.  . [DISCONTINUED] azithromycin (ZITHROMAX Z-PAK) 250 MG tablet As directed  . [DISCONTINUED] gabapentin (NEURONTIN) 100 MG capsule Take 1 capsule (100 mg total) by mouth 2 (two) times daily.  . [DISCONTINUED] HYDROcodone-homatropine (HYCODAN) 5-1.5 MG/5ML syrup Take 5 mLs by mouth every 8 (eight) hours as needed for cough.   Facility-Administered Encounter Medications as of 10/01/2017  Medication  . cyanocobalamin ((VITAMIN B-12)) injection 1,000 mcg     Review of Systems  Constitutional: Negative.   HENT: Negative.   Eyes: Negative.   Respiratory: Negative.   Cardiovascular: Negative.   Gastrointestinal: Negative.   Endocrine: Negative.   Genitourinary: Negative.   Musculoskeletal: Negative.   Skin: Negative.   Allergic/Immunologic: Negative.   Neurological: Negative.   Hematological: Negative.   Psychiatric/Behavioral: Negative.        Objective:   Physical Exam  Constitutional: She is oriented to person, place, and time. She appears well-developed and well-nourished. No distress.  The patient is pleasant and alert and without a lot of complaints today.  HENT:  Head: Normocephalic and atraumatic.  Left Ear: External ear normal.  Nose: Nose normal.  Mouth/Throat: Oropharynx is clear and moist. No oropharyngeal exudate.  The patient is wearing a hearing aid in the right ear.  Eyes: Pupils are equal, round, and reactive to light. Conjunctivae and EOM are normal. Right eye exhibits no discharge. Left eye exhibits no discharge. No scleral icterus.  Neck: Normal range of motion. Neck supple. No thyromegaly present.  No thyromegaly anterior cervical adenopathy or carotid bruit  Cardiovascular: Normal rate, regular rhythm, normal heart sounds and intact distal pulses.  No murmur heard. Heart is regular today at 60/min  Pulmonary/Chest: Effort normal and breath sounds normal. She has no wheezes. She has no rales.  Clear  anteriorly and posteriorly  Abdominal: Soft. Bowel sounds are normal. She exhibits no mass. There is no tenderness.  The abdomen is obese without liver or spleen enlargement masses or bruits.  Musculoskeletal: Normal range of motion. She exhibits edema. She exhibits no tenderness.  The patient's mobility is good.  She does have 3-4+ pedal edema and 2+ pretibial edema.  She is wearing support hose.  I really think the problem here is that she is not elevating her feet and she is wearing the support hose and this is aggravating the edema.  We will make some changes for the next few weeks and have her try to elevate her feet more and take an extra half of the Lasix on Monday Wednesday and Friday at 4:00 in the afternoon in addition to the whole Lasix that she takes in the morning.  Lymphadenopathy:    She has no cervical adenopathy.  Neurological: She is alert and oriented to person, place, and time. She has normal reflexes. No cranial nerve deficit.  Skin: Skin is warm and dry. No rash noted.  Psychiatric: She has a normal mood and affect.  Her behavior is normal. Judgment and thought content normal.  The patient's mood and affect are normal for her her judgment appears normal.  Nursing note and vitals reviewed.   BP (!) 133/57 (BP Location: Left Arm)   Pulse (!) 45   Temp 97.6 F (36.4 C) (Oral)   Ht '5\' 4"'$  (1.626 m)   Wt 212 lb (96.2 kg)   BMI 36.39 kg/m       Assessment & Plan:  1. Hypothyroidism due to acquired atrophy of thyroid -10 you current treatment pending results of lab work - CBC with Differential/Platelet - Thyroid Panel With TSH  2. Vitamin B 12 deficiency -Continue current treatment pending results of lab work - CBC with Differential/Platelet  3. Pure hypercholesterolemia -Continue aggressive therapeutic lifestyle changes pending results of lab work - CBC with Differential/Platelet - Lipid panel  4. Vitamin D deficiency -Continue with vitamin D replacement pending  results of lab work - CBC with Differential/Platelet - VITAMIN D 25 Hydroxy (Vit-D Deficiency, Fractures)  5. Thoracic aorta atherosclerosis ([place]) -Continue aggressive therapeutic lifestyle changes including diet and exercise - CBC with Differential/Platelet - Lipid panel  6. Gastroesophageal reflux disease with esophagitis -No complaints today with reflux and she is currently taking omeprazole or Prilosec for this. - CBC with Differential/Platelet - Hepatic function panel  7. Essential hypertension -The blood pressure is good today and she will continue with her lisinopril.  She will also try to continue watching sodium intake more closely. - CBC with Differential/Platelet - BMP8+EGFR - Hepatic function panel - lisinopril (PRINIVIL,ZESTRIL) 40 MG tablet; Take 1 tablet (40 mg total) by mouth daily.  Dispense: 90 tablet; Refill: 3  8. Morbid obesity ([place]) -Patient has morbid obesity based upon 2 or more comorbid risk factors.  We talked today about the importance of watching carbohydrates in her diet watching sodium intake and try to get more exercise to help bring the weight down to a more normal level.  She seems to understand this. - CBC with Differential/Platelet  9. Thrombocytopenia ([place]) -The patient did have some bruising on both arms today so we will check the platelet count. - CBC with Differential/Platelet  10. Paroxysmal atrial fibrillation (HCC) -The patient was in normal sinus rhythm today.  She does have a cardiology visit once yearly and this comes up late in the year or early of the next year. - CBC with Differential/Platelet  11. Adjustment reaction with anxiety and depression -This seems to be stable today. - CBC with Differential/Platelet  12.  Senile purpura -Check platelet count and continue to monitor this condition  13.  Edema -Elevate legs higher than level of heart at least for 20 minutes 3 or 4 times daily and try to sleep on the bed more often and  apply the support hose the first thing with arising in the morning   Meds ordered this encounter  Medications  . lisinopril (PRINIVIL,ZESTRIL) 40 MG tablet    Sig: Take 1 tablet (40 mg total) by mouth daily.    Dispense:  90 tablet    Refill:  3   Patient Instructions                       Medicare Annual Wellness Visit  [facility] and the medical providers at [facility] strive to bring you the best medical care.  In doing so we not only want to address your current medical conditions and concerns but also to detect new conditions  early and prevent illness, disease and health-related problems.    Medicare offers a yearly Wellness Visit which allows our clinical staff to assess your need for preventative services including immunizations, lifestyle education, counseling to decrease risk of preventable diseases and screening for fall risk and other medical concerns.    This visit is provided free of charge (no copay) for all Medicare recipients. The clinical pharmacists at [facility] have begun to conduct these Wellness Visits which will also include a thorough review of all your medications.    As you primary medical provider recommend that you make an appointment for your Annual Wellness Visit if you have not done so already this year.  You may set up this appointment before you leave today or you may call back [phone]) and schedule an appointment.  Please make sure when you call that you mention that you are scheduling your Annual Wellness Visit with the clinical pharmacist so that the appointment may be made for the proper length of time.     Continue current medications. Continue good therapeutic lifestyle changes which include good diet and exercise. Fall precautions discussed with patient. If an FOBT was given today- please return it to our front desk. If you are over 29 years old - you may need Prevnar 44 or the adult Pneumonia  vaccine.  **Flu shots are available--- please call and schedule a FLU-CLINIC appointment**  After your visit with [place] today you will receive a survey in the mail or online from [employer] regarding your care with [place]. Please take a moment to fill this out. Your feedback is very important to [place] as you can help [place] better understand your patient needs as well as improve your experience and satisfaction. WE CARE ABOUT YOU!!!   Increase Lasix and take 20 mg on Monday Wednesday and Friday at 4:00 in the afternoon in addition to the 40 that is taken every morning. Watch sodium intake more closely Stay well-hydrated Reduce sugar and carbs in the diet Please come by the office in about 4 weeks for recheck of blood pressure and weight Continue to follow-up with cardiology as planned Try to elevate the feet higher than the level of the heart is much as possible to help control edema.  Even if she does this for 20 minutes 3 or 4 times a day that would be more helpful than not elevating them at all.  Arrie Senate MD

## 2017-10-01 NOTE — Patient Instructions (Addendum)
Medicare Annual Wellness Visit  Ione and the medical providers at [facility] strive to bring you the best medical care.  In doing so we not only want to address your current medical conditions and concerns but also to detect new conditions early and prevent illness, disease and health-related problems.    Medicare offers a yearly Wellness Visit which allows our clinical staff to assess your need for preventative services including immunizations, lifestyle education, counseling to decrease risk of preventable diseases and screening for fall risk and other medical concerns.    This visit is provided free of charge (no copay) for all Medicare recipients. The clinical pharmacists at [facility] have begun to conduct these Wellness Visits which will also include a thorough review of all your medications.    As you primary medical provider recommend that you make an appointment for your Annual Wellness Visit if you have not done so already this year.  You may set up this appointment before you leave today or you may call back [phone] and schedule an appointment.  Please make sure when you call that you mention that you are scheduling your Annual Wellness Visit with the clinical pharmacist so that the appointment may be made for the proper length of time.     Continue current medications. Continue good therapeutic lifestyle changes which include good diet and exercise. Fall precautions discussed with patient. If an FOBT was given today- please return it to our front desk. If you are over 82 years old - you may need Prevnar 13 or the adult Pneumonia vaccine.  **Flu shots are available--- please call and schedule a FLU-CLINIC appointment**  After your visit with us today you will receive a survey in the mail or online from [employer] regarding your care with us. Please take a moment to fill this out. Your feedback is very  important to us as you can help us better understand your patient needs as well as improve your experience and satisfaction. WE CARE ABOUT YOU!!!   Increase Lasix and take 20 mg on Monday Wednesday and Friday at 4:00 in the afternoon in addition to the 40 that is taken every morning. Watch sodium intake more closely Stay well-hydrated Reduce sugar and carbs in the diet Please come by the office in about 4 weeks for recheck of blood pressure and weight Continue to follow-up with cardiology as planned Try to elevate the feet higher than the level of the heart is much as possible to help control edema.  Even if she does this for 20 minutes 3 or 4 times a day that would be more helpful than not elevating them at all.

## 2017-10-02 LAB — HEPATIC FUNCTION PANEL
ALT: 11 IU/L (ref 0–32)
AST: 19 IU/L (ref 0–40)
Albumin: 4.4 g/dL (ref 3.5–4.7)
Alkaline Phosphatase: 174 IU/L — ABNORMAL HIGH (ref 39–117)
BILIRUBIN TOTAL: 0.8 mg/dL (ref 0.0–1.2)
Bilirubin, Direct: 0.3 mg/dL (ref 0.00–0.40)
Total Protein: 7.1 g/dL (ref 6.0–8.5)

## 2017-10-02 LAB — THYROID PANEL WITH TSH
FREE THYROXINE INDEX: 1.9 (ref 1.2–4.9)
T3 Uptake Ratio: 27 % (ref 24–39)
T4, Total: 7.2 ug/dL (ref 4.5–12.0)
TSH: 4.4 u[IU]/mL (ref 0.450–4.500)

## 2017-10-02 LAB — LIPID PANEL
Chol/HDL Ratio: 2.5 ratio (ref 0.0–4.4)
Cholesterol, Total: 154 mg/dL (ref 100–199)
HDL: 61 mg/dL (ref >39)
LDL Calculated: 79 mg/dL (ref 0–99)
Triglycerides: 68 mg/dL (ref 0–149)
VLDL CHOLESTEROL CAL: 14 mg/dL (ref 5–40)

## 2017-10-02 LAB — BMP8+EGFR
BUN/Creatinine Ratio: 14 (ref 12–28)
BUN: 16 mg/dL (ref 8–27)
CHLORIDE: 102 mmol/L (ref 96–106)
CO2: 26 mmol/L (ref 20–29)
Calcium: 9.8 mg/dL (ref 8.7–10.3)
Creatinine, Ser: 1.18 mg/dL — ABNORMAL HIGH (ref 0.57–1.00)
GFR calc non Af Amer: 43 mL/min/{1.73_m2} — ABNORMAL LOW (ref >59)
GFR, EST AFRICAN AMERICAN: 49 mL/min/{1.73_m2} — AB (ref >59)
Glucose: 92 mg/dL (ref 65–99)
POTASSIUM: 4.2 mmol/L (ref 3.5–5.2)
Sodium: 144 mmol/L (ref 134–144)

## 2017-10-02 LAB — VITAMIN D 25 HYDROXY (VIT D DEFICIENCY, FRACTURES): Vit D, 25-Hydroxy: 65.8 ng/mL (ref 30.0–100.0)

## 2017-10-02 LAB — CBC WITH DIFFERENTIAL/PLATELET
BASOS ABS: 0 10*3/uL (ref 0.0–0.2)
BASOS: 1 %
EOS (ABSOLUTE): 0.2 10*3/uL (ref 0.0–0.4)
Eos: 5 %
HEMOGLOBIN: 10.5 g/dL — AB (ref 11.1–15.9)
Hematocrit: 30.9 % — ABNORMAL LOW (ref 34.0–46.6)
IMMATURE GRANS (ABS): 0 10*3/uL (ref 0.0–0.1)
IMMATURE GRANULOCYTES: 0 %
LYMPHS: 19 %
Lymphocytes Absolute: 0.9 10*3/uL (ref 0.7–3.1)
MCH: 31.4 pg (ref 26.6–33.0)
MCHC: 34 g/dL (ref 31.5–35.7)
MCV: 93 fL (ref 79–97)
MONOCYTES: 11 %
Monocytes Absolute: 0.6 10*3/uL (ref 0.1–0.9)
NEUTROS PCT: 64 %
Neutrophils Absolute: 3.2 10*3/uL (ref 1.4–7.0)
Platelets: 105 10*3/uL — ABNORMAL LOW (ref 150–450)
RBC: 3.34 x10E6/uL — ABNORMAL LOW (ref 3.77–5.28)
RDW: 13.8 % (ref 12.3–15.4)
WBC: 4.9 10*3/uL (ref 3.4–10.8)

## 2017-10-07 ENCOUNTER — Ambulatory Visit: Payer: Medicare Other | Admitting: Neurology

## 2017-10-08 ENCOUNTER — Encounter: Payer: Self-pay | Admitting: Family

## 2017-10-08 ENCOUNTER — Ambulatory Visit (INDEPENDENT_AMBULATORY_CARE_PROVIDER_SITE_OTHER): Payer: Medicare Other | Admitting: Family

## 2017-10-08 VITALS — BP 138/62 | HR 54 | Temp 97.4°F | Ht 64.0 in | Wt 207.2 lb

## 2017-10-08 DIAGNOSIS — L0291 Cutaneous abscess, unspecified: Secondary | ICD-10-CM

## 2017-10-08 MED ORDER — DOXYCYCLINE HYCLATE 100 MG PO TABS: 100.0000 mg | ORAL_TABLET | Freq: Two times a day (BID) | ORAL | 0 refills | Status: DC

## 2017-10-08 NOTE — Progress Notes (Signed)
   Subjective:    Patient ID: [name], female    DOB: 1933/03/02, 84 y.o.   MRN: [id number]  Chief Complaint  Patient presents with  . painful sore in groin    had for over a week    HPI Pt presents to the office today with an abscess in her left groin that she noticed about a week ago. She reports the area has become more tender, hard, and draining a yellow, bloody discharge.   She reports intermittent shooting pain of 7 out 10 that is worse when she is "getting up or down". She has put antibiotic ointment with mild relief.         Review of Systems  All other systems reviewed and are negative.      Objective:   Physical Exam  Constitutional: She is oriented to person, place, and time. She appears well-developed and well-nourished. No distress.  HENT:  Head: Normocephalic.  Cardiovascular: Normal rate, regular rhythm, normal heart sounds and intact distal pulses.  No murmur heard. Pulmonary/Chest: Effort normal and breath sounds normal. No respiratory distress. She has no wheezes.  Abdominal: Soft. Bowel sounds are normal. She exhibits no distension. There is no tenderness.  Musculoskeletal: Normal range of motion. She exhibits no edema or tenderness.  Neurological: She is alert and oriented to person, place, and time. She has normal reflexes. No cranial nerve deficit.  Skin: Skin is warm and dry. Rash noted. Rash is pustular (abscess on in left groin, hard, tender, drainage of serosanguineous ).     Psychiatric: She has a normal mood and affect. Her behavior is normal. Judgment and thought content normal.  Vitals reviewed.     BP 138/62   Pulse (!) 54   Temp (!) 97.4 F (36.3 C) (Oral)   Ht 5\' 4"  (1.626 m)   Wt 207 lb 3.2 oz (94 kg)   BMI 35.57 kg/m      Assessment & Plan:  [name] was seen today for painful sore in groin.  Diagnoses and all orders for this visit:  Abscess -     doxycycline (VIBRA-TABS) 100 MG tablet; Take 1 tablet (100 mg total) by  mouth 2 (two) times daily.   Warm compression Do not pick or squeeze Keep clean and dry, let area drain RTO in 10 days to recheck   Jannifer Rodneyhristy Hawks, FNP

## 2017-10-08 NOTE — Patient Instructions (Signed)

## 2017-10-09 ENCOUNTER — Ambulatory Visit (INDEPENDENT_AMBULATORY_CARE_PROVIDER_SITE_OTHER): Payer: Medicare Other | Admitting: *Deleted

## 2017-10-09 DIAGNOSIS — E538 Deficiency of other specified B group vitamins: Secondary | ICD-10-CM | POA: Diagnosis not present

## 2017-10-09 NOTE — Progress Notes (Signed)
Pt given cyanocobalamin inj Tolerated well 

## 2017-10-11 ENCOUNTER — Encounter: Payer: Self-pay | Admitting: Neurology

## 2017-10-11 ENCOUNTER — Ambulatory Visit (INDEPENDENT_AMBULATORY_CARE_PROVIDER_SITE_OTHER): Payer: Medicare Other | Admitting: Neurology

## 2017-10-11 VITALS — BP 90/48 | HR 56 | Ht 64.0 in | Wt 204.0 lb

## 2017-10-11 DIAGNOSIS — G4485 Primary stabbing headache: Secondary | ICD-10-CM | POA: Diagnosis not present

## 2017-10-11 DIAGNOSIS — G250 Essential tremor: Secondary | ICD-10-CM | POA: Diagnosis not present

## 2017-10-11 NOTE — Progress Notes (Signed)
NEUROLOGY FOLLOW UP OFFICE NOTE  [name] [id number]  HISTORY OF PRESENT ILLNESS: [name] is an 84 year old female with atrial fibrillation, essential tremor,hypothyroidism and hypertension who follows up for stabbing/tension-type headache and tremor.  She is accompanied by her daughter who supplements history.     UPDATE: Headache: No tension type headaches.  Primary stabbing headaches infrequent: Intensity:  7/10 Duration:  seconds Frequency:  Infrequent (once a month at most) Frequency of abortive medication: infrequent Current NSAIDS:  no Current analgesics:  Tylenol Current triptans:  no Current anti-emetic:  no Current muscle relaxants:  no Current anti-anxiolytic:  no Current sleep aide:  no Current Antihypertensive medications:  Lisinopril, furosemide Current Antidepressant medications:  no Current Anticonvulsant medications:  Gabapentin 100mg  Current Vitamins/Herbal/Supplements:  no Current Antihistamines/Decongestants:  no Other therapy:  no   Alcohol:  no Smoker:  no Depression/anxiety:  anxiety   Essential Tremor: Stable   HISTORY:  Headache: Onset:  March 2018.  It has gotten a little better. Location:  Dull pain on top of head; stabbing pain various Quality:  constant dull pain; also paroxysmal stabbing pain Initial Intensity:  constant dull pain 4/10; shooting pain 7-8/10 Aura:  no Prodrome:  no Postdrome:  no Associated symptoms:  No nausea, vomiting, photophobia, phonophobia, visual disturbance, focal numbness or weakness.  She has not had any new worse headache of her life, waking up from sleep Initial Duration:  dull pain constant; shooting pain seconds Initial Frequency:  dull pain constant; shooting pain several times daily Initial Frequency of abortive medication: less than once a week Triggers/exacerbating factors:  At first, she thought it was side effect of medication, since it correlated with initiation of Eliquis.  Stress may  make it worse. Relieving factors:  no Activity:  functions   Past NSAIDS:  Ibuprofen (cannot take due to stomach upset) Past analgesics:  no Past abortive triptans:  no Past muscle relaxants:  no Past anti-emetic:  no Past antihypertensive medications:  no Past antidepressant medications:  no Past anticonvulsant medications:  no Past vitamins/Herbal/Supplements:  no Past antihistamines/decongestants:  no Other past therapies:  no   Family history of headache:  No   TREMOR: She has had tremor in the hands for at least 10 years and has gradually gotten worse.  It started in the left hand and then moved to the right hand.  She is fine at rest.  She notices it when she uses her hand and is disruptive when she writes, uses utensils or holds a book.  She is able to dress without difficulty.  Tremor does not run in the family  PAST MEDICAL HISTORY: Past Medical History:  Diagnosis Date  . AF (atrial fibrillation) (HCC)   . Arthritis   . Diaphragmatic hernia without mention of obstruction or gangrene   . Esophagitis   . First degree atrioventricular block   . Gastric polyp   . GERD (gastroesophageal reflux disease)   . Heme positive stool   . Hyperlipidemia   . Hypertension   . Hypothyroid   . Prolapse of vaginal walls without mention of uterine prolapse   . Rectal polyp     MEDICATIONS: Current Outpatient Medications on File Prior to Visit  Medication Sig Dispense Refill  . albuterol (PROVENTIL HFA;VENTOLIN HFA) 108 (90 Base) MCG/ACT inhaler Inhale 2 puffs into the lungs every 6 (six) hours as needed for wheezing or shortness of breath. 8 g 0  . calcium-vitamin D (OSCAL WITH D) 500-200 MG-UNIT per tablet  Take 1 tablet by mouth daily.    . Cholecalciferol (VITAMIN D3) 2000 UNITS TABS Take 1 tablet by mouth daily.      Marland Kitchen. doxycycline (VIBRA-TABS) 100 MG tablet Take 1 tablet (100 mg total) by mouth 2 (two) times daily. 20 tablet 0  . ELIQUIS 5 MG TABS tablet TAKE 1 TABLET BY MOUTH  TWICE DAILY 180 tablet 1  . ferrous fumarate-iron polysaccharide complex (TANDEM) 162-115.2 MG CAPS capsule Take 1 capsule by mouth daily with breakfast. 30 capsule 2  . fluticasone (FLONASE) 50 MCG/ACT nasal spray Place 2 sprays into both nostrils daily. 16 g 6  . furosemide (LASIX) 40 MG tablet Take 1 tablet (40 mg total) by mouth daily. 90 tablet 3  . gabapentin (NEURONTIN) 100 MG capsule Take 1 capsule (100 mg total) by mouth 2 (two) times daily. 60 capsule 4  . KLOR-CON 10 10 MEQ tablet Take 1 tablet (10 mEq total) by mouth daily. 90 tablet 3  . levothyroxine (SYNTHROID, LEVOTHROID) 137 MCG tablet TAKE 1 TABLET BY MOUTH ONCE DAILY AS  DIRECTED 90 tablet 2  . lisinopril (PRINIVIL,ZESTRIL) 40 MG tablet Take 1 tablet (40 mg total) by mouth daily. 90 tablet 3  . LORazepam (ATIVAN) 0.5 MG tablet One half tablet to one daily if needed for anxiety (Patient not taking: Reported on 10/11/2017) 30 tablet 1  . meloxicam (MOBIC) 15 MG tablet Take 1 tablet (15 mg total) daily by mouth. 30 tablet 3  . Multiple Vitamins-Minerals (CENTRUM SILVER) tablet Take 1 tablet by mouth daily.     Marland Kitchen. omeprazole (PRILOSEC) 20 MG capsule TAKE 1 CAPSULE BY MOUTH TWICE DAILY BEFORE  A  MEAL 180 capsule 0  . PARoxetine (PAXIL) 10 MG tablet Take 1 tablet (10 mg total) by mouth daily. (Patient not taking: Reported on 10/11/2017) 90 tablet 1  . polyethylene glycol powder (GLYCOLAX/MIRALAX) powder DISSOLVE 17 GRAMS (ONE CAPFUL) OF POWDER IN WATER & DRINK ONCE DAILY AS NEEDED 850 g 1   Current Facility-Administered Medications on File Prior to Visit  Medication Dose Route Frequency Provider Last Rate Last Dose  . cyanocobalamin ((VITAMIN B-12)) injection 1,000 mcg  1,000 mcg Intramuscular Q30 days Ernestina PennaMoore, Donald W, MD   1,000 mcg at 10/09/17 1039    ALLERGIES: Allergies  Allergen Reactions  . Aspirin Nausea Only  . Codeine Nausea Only  . Sulfa Antibiotics   . Vicodin [Hydrocodone-Acetaminophen] Nausea Only  . Warfarin Sodium  Other (See Comments)    Tingling all over  . Cephalexin Rash    Red, rash covering lower limbs.    FAMILY HISTORY: Family History  Problem Relation Age of Onset  . Hypertension Mother   . Aneurysm Mother        Brain  . Hypertension Father   . Kidney disease Father   . Aneurysm Father        heart  . Arthritis Daughter   . Cancer Daughter        breast  . Hyperlipidemia Sister     SOCIAL HISTORY: Social History   Socioeconomic History  . Marital status: Divorced    Spouse name: Not on file  . Number of children: 2  . Years of education: Not on file  . Highest education level: Not on file  Occupational History  . Occupation: retired    Comment: [employer]  Social Needs  . Financial resource strain: Not on file  . Food insecurity:    Worry: Not on file    Inability: Not on  file  . Transportation needs:    Medical: Not on file    Non-medical: Not on file  Tobacco Use  . Smoking status: Never Smoker  . Smokeless tobacco: Never Used  Substance and Sexual Activity  . Alcohol use: No  . Drug use: No  . Sexual activity: Not on file  Lifestyle  . Physical activity:    Days per week: Not on file    Minutes per session: Not on file  . Stress: Not on file  Relationships  . Social connections:    Talks on phone: Not on file    Gets together: Not on file    Attends religious service: Not on file    Active member of club or organization: Not on file    Attends meetings of clubs or organizations: Not on file    Relationship status: Not on file  . Intimate partner violence:    Fear of current or ex partner: Not on file    Emotionally abused: Not on file    Physically abused: Not on file    Forced sexual activity: Not on file  Other Topics Concern  . Not on file  Social History Narrative  . Not on file    REVIEW OF SYSTEMS: Constitutional: No fevers, chills, or sweats, no generalized fatigue, change in appetite Eyes: No visual changes, double vision, eye  pain Ear, nose and throat: No hearing loss, ear pain, nasal congestion, sore throat Cardiovascular: No chest pain, palpitations Respiratory:  No shortness of breath at rest or with exertion, wheezes GastrointestinaI: No nausea, vomiting, diarrhea, abdominal pain, fecal incontinence Genitourinary:  No dysuria, urinary retention or frequency Musculoskeletal:  No neck pain, back pain Integumentary: No rash, pruritus, skin lesions Neurological: as above Psychiatric: anxiety Endocrine: No palpitations, fatigue, diaphoresis, mood swings, change in appetite, change in weight, increased thirst Hematologic/Lymphatic:  No purpura, petechiae. Allergic/Immunologic: no itchy/runny eyes, nasal congestion, recent allergic reactions, rashes  PHYSICAL EXAM: Vitals:   10/11/17 1320  BP: (!) 90/48  Pulse: (!) 56  SpO2: 94%   General: No acute distress.  Patient appears well-groomed.   Head:  Normocephalic/atraumatic Eyes:  Fundi examined but not visualized Neck: supple, no paraspinal tenderness, full range of motion Heart:  Regular rate and rhythm Lungs:  Clear to auscultation bilaterally Back: No paraspinal tenderness Neurological Exam: alert and oriented to person, place, and time. Attention span and concentration intact, recent and remote memory intact, fund of knowledge intact.  Speech fluent and not dysarthric, language intact.  Decreased hearing in left ear.  Otherwise, CN II-XII intact. Bulk and tone normal, muscle strength 5/5 throughout.  Sensation to light touch, temperature and vibration intact.  Deep tendon reflexes 2+ upper extremities, absent lower extremities.  Finger to nose testing intact.  Cautious slow wide-based gait, Romberg negative.  IMPRESSION: Primary stabbing headache Benign essential tremor, not appreciated on today's exam  PLAN: Gabapentin 100mg  at bedtime Follow up in one year or as needed   Shon Millet, DO  CC:  Dr. Christell Constant

## 2017-10-11 NOTE — Patient Instructions (Signed)
Continue gabapentin 100mg  at bedtime Follow up in one year or as needed

## 2017-10-18 ENCOUNTER — Encounter: Payer: Self-pay | Admitting: Family

## 2017-10-18 ENCOUNTER — Ambulatory Visit (INDEPENDENT_AMBULATORY_CARE_PROVIDER_SITE_OTHER): Payer: Medicare Other | Admitting: Family

## 2017-10-18 VITALS — BP 145/80 | HR 54 | Temp 97.1°F | Ht 64.0 in | Wt 203.2 lb

## 2017-10-18 DIAGNOSIS — L0291 Cutaneous abscess, unspecified: Secondary | ICD-10-CM | POA: Diagnosis not present

## 2017-10-18 DIAGNOSIS — B370 Candidal stomatitis: Secondary | ICD-10-CM

## 2017-10-18 MED ORDER — NYSTATIN 100000 UNIT/ML MT SUSP
5.0000 mL | Freq: Four times a day (QID) | OROMUCOSAL | 1 refills | Status: DC
Start: 2017-10-18 — End: 2017-12-10

## 2017-10-18 MED ORDER — DOXYCYCLINE HYCLATE 100 MG PO TABS: 100.0000 mg | ORAL_TABLET | Freq: Two times a day (BID) | ORAL | 0 refills | Status: DC

## 2017-10-18 NOTE — Patient Instructions (Signed)

## 2017-10-18 NOTE — Progress Notes (Signed)
   Subjective:    Patient ID: [name], female    DOB: Oct 23, 1932, 84 y.o.   MRN: [id number]  Chief Complaint  Patient presents with  . recheck abcess    HPI PT presents to the office today to recheck abscess on left groin that she noticed several weeks ago. She was seen in our office on 10/08/17 and was started on doxycyline and she reports the area had improved with mild drainage.   She states yesterday she noticed a "little stinging". She wears a pad that has had a bloody discharge.       Review of Systems  All other systems reviewed and are negative.      Objective:   Physical Exam  Constitutional: She is oriented to person, place, and time. She appears well-developed and well-nourished. No distress.  Cardiovascular: Normal rate, regular rhythm, normal heart sounds and intact distal pulses.  No murmur heard. Pulmonary/Chest: Effort normal and breath sounds normal. No respiratory distress. She has no wheezes.  Abdominal: Soft. Bowel sounds are normal. She exhibits no distension. There is no tenderness.  Genitourinary:     Musculoskeletal: Normal range of motion. She exhibits no edema or tenderness.  Neurological: She is alert and oriented to person, place, and time. She has normal reflexes. No cranial nerve deficit.  Skin: Skin is warm and dry. Rash noted. Rash is pustular (small hard abscess that has greatly improved in size,discharge, and tenderness ).  Psychiatric: She has a normal mood and affect. Her behavior is normal. Judgment and thought content normal.  Vitals reviewed.   BP (!) 145/80   Pulse (!) 54   Temp (!) 97.1 F (36.2 C) (Oral)   Ht 5\' 4"  (1.626 m)   Wt 203 lb 3.2 oz (92.2 kg)   BMI 34.88 kg/m        Assessment & Plan:  [name] was seen today for recheck abcess.  Diagnoses and all orders for this visit:  Abscess -     doxycycline (VIBRA-TABS) 100 MG tablet; Take 1 tablet (100 mg total) by mouth 2 (two) times daily.  Oral thrush -      nystatin (MYCOSTATIN) 100000 UNIT/ML suspension; Take 5 mLs (500,000 Units total) by mouth 4 (four) times daily.   Keep clean and dry  Do not squeeze  Warm compresses  Report any s/s of increase pain, swelling, or erythemas RTO as needed    Jannifer Rodneyhristy Roschelle Calandra, FNP

## 2017-10-31 DIAGNOSIS — I70203 Unspecified atherosclerosis of native arteries of extremities, bilateral legs: Secondary | ICD-10-CM | POA: Diagnosis not present

## 2017-10-31 DIAGNOSIS — B351 Tinea unguium: Secondary | ICD-10-CM | POA: Diagnosis not present

## 2017-10-31 DIAGNOSIS — M79676 Pain in unspecified toe(s): Secondary | ICD-10-CM | POA: Diagnosis not present

## 2017-10-31 DIAGNOSIS — L84 Corns and callosities: Secondary | ICD-10-CM | POA: Diagnosis not present

## 2017-11-08 ENCOUNTER — Ambulatory Visit (INDEPENDENT_AMBULATORY_CARE_PROVIDER_SITE_OTHER): Payer: Medicare Other | Admitting: *Deleted

## 2017-11-08 DIAGNOSIS — E538 Deficiency of other specified B group vitamins: Secondary | ICD-10-CM | POA: Diagnosis not present

## 2017-11-08 NOTE — Progress Notes (Signed)
Pt given Cyanocobalamin inj Tolerated well 

## 2017-11-13 ENCOUNTER — Ambulatory Visit (INDEPENDENT_AMBULATORY_CARE_PROVIDER_SITE_OTHER): Payer: Medicare Other | Admitting: Family Medicine

## 2017-11-13 ENCOUNTER — Encounter: Payer: Self-pay | Admitting: Family Medicine

## 2017-11-13 VITALS — BP 100/52 | HR 55 | Temp 99.0°F | Ht 64.0 in | Wt 205.0 lb

## 2017-11-13 DIAGNOSIS — R609 Edema, unspecified: Secondary | ICD-10-CM | POA: Diagnosis not present

## 2017-11-13 DIAGNOSIS — R945 Abnormal results of liver function studies: Secondary | ICD-10-CM | POA: Diagnosis not present

## 2017-11-13 DIAGNOSIS — I951 Orthostatic hypotension: Secondary | ICD-10-CM | POA: Diagnosis not present

## 2017-11-13 DIAGNOSIS — R7989 Other specified abnormal findings of blood chemistry: Secondary | ICD-10-CM

## 2017-11-13 DIAGNOSIS — R710 Precipitous drop in hematocrit: Secondary | ICD-10-CM

## 2017-11-13 NOTE — Patient Instructions (Signed)
Continue to leave off sodium in the diet as much as possible Elevate feet a couple times during the day for 20 minutes higher than the level of the heart Only take an extra half of a fluid pill if needed and no more than 3 times weekly Continue to wear support stockings We will recheck liver function test because of the elevated liver function test most recently a hemoglobin and a BMP.  We will call you with those results as soon as they become available

## 2017-11-13 NOTE — Progress Notes (Signed)
Subjective:    Patient ID: [name], female    DOB: 02-11-1933, 84 y.o.   MRN: [id number]  HPI Patient here today for 4 week follow up on HTN, edema and weight.  The patient never took the additional fluid pill.  She says the fluid is better.  She has not been taking the extra half of the Lasix 3 days a week as directed.  Her vital signs today have a lower blood pressure.  Her weight is actually up 2 pounds.  The patient's weight was lower this morning at home.  She did have one episode when she was outside working and felt very lightheaded.  I reminded her that is very important as high as it is to stay out of the heat of the day and to try to drink plenty of fluids and stay well-hydrated.  She denies any chest pain pressure tightness or shortness of breath.  She denies any trouble with her GI tract including nausea vomiting or diarrhea and no problems with passing her water.   Patient Active Problem List   Diagnosis Date Noted  . Non-rheumatic mitral regurgitation 03/20/2017  . Thrombocytopenia ([place]) 09/14/2016  . Hemoglobin decreased 09/14/2016  . Hearing loss 01/06/2015  . Thoracic aorta atherosclerosis ([place]) 03/04/2014  . Degenerative arthritis of thoracic spine 03/04/2014  . Osteoporosis, post-menopausal 08/12/2013  . Metabolic syndrome 41/32/4401  . Anemia, iron deficiency 03/16/2013  . Vitamin D deficiency 03/16/2013  . HTN (hypertension) 03/16/2013  . Hypothyroidism   . Diaphragmatic hernia without mention of obstruction or gangrene   . GERD (gastroesophageal reflux disease)   . Hyperlipidemia   . Prolapse of vaginal walls without mention of uterine prolapse   . Arthritis   . First degree atrioventricular block   . Symptomatic menopausal or female climacteric states   . AF (atrial fibrillation) ([place])   . Rectal polyp   . Gastric polyp    Outpatient Encounter Medications as of 11/13/2017  Medication Sig  . albuterol (PROVENTIL HFA;VENTOLIN HFA) 108 (90 Base) MCG/ACT  inhaler Inhale 2 puffs into the lungs every 6 (six) hours as needed for wheezing or shortness of breath.  . calcium-vitamin D (OSCAL WITH D) 500-200 MG-UNIT per tablet Take 1 tablet by mouth daily.  . Cholecalciferol (VITAMIN D3) 2000 UNITS TABS Take 1 tablet by mouth daily.    Marland Kitchen ELIQUIS 5 MG TABS tablet TAKE 1 TABLET BY MOUTH TWICE DAILY  . ferrous fumarate-iron polysaccharide complex (TANDEM) 162-115.2 MG CAPS capsule Take 1 capsule by mouth daily with breakfast.  . fluticasone (FLONASE) 50 MCG/ACT nasal spray Place 2 sprays into both nostrils daily.  . furosemide (LASIX) 40 MG tablet Take 1 tablet (40 mg total) by mouth daily.  Marland Kitchen gabapentin (NEURONTIN) 100 MG capsule Take 1 capsule (100 mg total) by mouth 2 (two) times daily.  Marland Kitchen KLOR-CON 10 10 MEQ tablet Take 1 tablet (10 mEq total) by mouth daily.  Marland Kitchen levothyroxine (SYNTHROID, LEVOTHROID) 137 MCG tablet TAKE 1 TABLET BY MOUTH ONCE DAILY AS  DIRECTED  . lisinopril (PRINIVIL,ZESTRIL) 40 MG tablet Take 1 tablet (40 mg total) by mouth daily.  Marland Kitchen LORazepam (ATIVAN) 0.5 MG tablet One half tablet to one daily if needed for anxiety  . meloxicam (MOBIC) 15 MG tablet Take 1 tablet (15 mg total) daily by mouth.  . Multiple Vitamins-Minerals (CENTRUM SILVER) tablet Take 1 tablet by mouth daily.   Marland Kitchen nystatin (MYCOSTATIN) 100000 UNIT/ML suspension Take 5 mLs (500,000 Units total) by mouth 4 (four)  times daily.  Marland Kitchen omeprazole (PRILOSEC) 20 MG capsule TAKE 1 CAPSULE BY MOUTH TWICE DAILY BEFORE  A  MEAL  . PARoxetine (PAXIL) 10 MG tablet Take 1 tablet (10 mg total) by mouth daily.  . polyethylene glycol powder (GLYCOLAX/MIRALAX) powder DISSOLVE 17 GRAMS (ONE CAPFUL) OF POWDER IN WATER & DRINK ONCE DAILY AS NEEDED  . [DISCONTINUED] doxycycline (VIBRA-TABS) 100 MG tablet Take 1 tablet (100 mg total) by mouth 2 (two) times daily.   Facility-Administered Encounter Medications as of 11/13/2017  Medication  . cyanocobalamin ((VITAMIN B-12)) injection 1,000 mcg       Review of Systems  Constitutional: Negative.   HENT: Negative.   Eyes: Negative.   Respiratory: Negative.   Cardiovascular: Positive for leg swelling (still some edema).  Gastrointestinal: Negative.   Endocrine: Negative.   Genitourinary: Negative.   Musculoskeletal: Negative.   Skin: Negative.   Allergic/Immunologic: Negative.   Neurological: Negative.   Hematological: Negative.   Psychiatric/Behavioral: Negative.        Objective:   Physical Exam  Constitutional: She is oriented to person, place, and time. She appears well-developed and well-nourished. No distress.  The patient is alert and smiling and feeling well other than being overheated from being outside.  HENT:  Head: Normocephalic and atraumatic.  Eyes: Pupils are equal, round, and reactive to light. Conjunctivae and EOM are normal. Right eye exhibits no discharge. Left eye exhibits no discharge. No scleral icterus.  Neck: Normal range of motion.  Cardiovascular: Normal rate and regular rhythm.  Murmur heard. Heart is regular at 60/min with a grade 2/6 systolic ejection murmur  Pulmonary/Chest: Effort normal and breath sounds normal. She has no wheezes. She has no rales.  Musculoskeletal: Normal range of motion. She exhibits edema.  1+ pedal edema with support stockings on  Lymphadenopathy:    She has no cervical adenopathy.  Neurological: She is alert and oriented to person, place, and time. She has normal reflexes.  Skin: Skin is warm and dry. No rash noted.  Psychiatric: She has a normal mood and affect. Her behavior is normal. Judgment and thought content normal.  Patient is smiling and mood and affect appeared normal for her.  Nursing note and vitals reviewed.  BP (!) 91/46 (BP Location: Left Arm)   Pulse (!) 55   Temp 99 F (37.2 C) (Oral)   Ht '5\' 4"'  (1.626 m)   Wt 205 lb (93 kg)   BMI 35.19 kg/m         Assessment & Plan:  1. Edema, unspecified type -Continue with current treatment  regimen as far as diuretics are concerned -Take an extra half of the Lasix only if needed -If you can control fluid retention by reducing sodium intake this is much better than taking extra fluid pills. - BMP8+EGFR  2. Elevated liver function tests -Repeat LFTs  3. Decreased hemoglobin -Recheck hemoglobin  4. Orthostatic hypotension -Drink plenty of fluids and stay well-hydrated  Patient Instructions  Continue to leave off sodium in the diet as much as possible Elevate feet a couple times during the day for 20 minutes higher than the level of the heart Only take an extra half of a fluid pill if needed and no more than 3 times weekly Continue to wear support stockings We will recheck liver function test because of the elevated liver function test most recently a hemoglobin and a BMP.  We will call you with those results as soon as they become available  Arrie Senate MD

## 2017-11-14 ENCOUNTER — Telehealth: Payer: Self-pay | Admitting: Family Medicine

## 2017-11-14 ENCOUNTER — Other Ambulatory Visit: Payer: Self-pay

## 2017-11-14 DIAGNOSIS — N289 Disorder of kidney and ureter, unspecified: Secondary | ICD-10-CM

## 2017-11-14 LAB — CBC WITH DIFFERENTIAL/PLATELET
BASOS: 1 %
Basophils Absolute: 0 10*3/uL (ref 0.0–0.2)
EOS (ABSOLUTE): 0.3 10*3/uL (ref 0.0–0.4)
EOS: 6 %
Hematocrit: 31.7 % — ABNORMAL LOW (ref 34.0–46.6)
Hemoglobin: 10.8 g/dL — ABNORMAL LOW (ref 11.1–15.9)
IMMATURE GRANS (ABS): 0 10*3/uL (ref 0.0–0.1)
IMMATURE GRANULOCYTES: 0 %
LYMPHS: 24 %
Lymphocytes Absolute: 1.2 10*3/uL (ref 0.7–3.1)
MCH: 31.6 pg (ref 26.6–33.0)
MCHC: 34.1 g/dL (ref 31.5–35.7)
MCV: 93 fL (ref 79–97)
Monocytes Absolute: 0.4 10*3/uL (ref 0.1–0.9)
Monocytes: 7 %
NEUTROS PCT: 62 %
Neutrophils Absolute: 3.1 10*3/uL (ref 1.4–7.0)
PLATELETS: 104 10*3/uL — AB (ref 150–450)
RBC: 3.42 x10E6/uL — AB (ref 3.77–5.28)
RDW: 14.3 % (ref 12.3–15.4)
WBC: 5 10*3/uL (ref 3.4–10.8)

## 2017-11-14 LAB — BMP8+EGFR
BUN/Creatinine Ratio: 18 (ref 12–28)
BUN: 38 mg/dL — ABNORMAL HIGH (ref 8–27)
CALCIUM: 9.4 mg/dL (ref 8.7–10.3)
CO2: 26 mmol/L (ref 20–29)
CREATININE: 2.14 mg/dL — AB (ref 0.57–1.00)
Chloride: 101 mmol/L (ref 96–106)
GFR calc Af Amer: 24 mL/min/{1.73_m2} — ABNORMAL LOW (ref >59)
GFR, EST NON AFRICAN AMERICAN: 21 mL/min/{1.73_m2} — AB (ref >59)
GLUCOSE: 115 mg/dL — AB (ref 65–99)
Potassium: 5 mmol/L (ref 3.5–5.2)
Sodium: 142 mmol/L (ref 134–144)

## 2017-11-14 NOTE — Telephone Encounter (Signed)
Patient aware of results.

## 2017-12-03 NOTE — Progress Notes (Unsigned)
[facility] Everywhere  

## 2017-12-09 ENCOUNTER — Ambulatory Visit (INDEPENDENT_AMBULATORY_CARE_PROVIDER_SITE_OTHER): Payer: Medicare Other | Admitting: *Deleted

## 2017-12-09 DIAGNOSIS — E538 Deficiency of other specified B group vitamins: Secondary | ICD-10-CM | POA: Diagnosis not present

## 2017-12-09 NOTE — Progress Notes (Signed)
Pt given Cyanocobalamin inj Tolerated well 

## 2017-12-10 ENCOUNTER — Ambulatory Visit (INDEPENDENT_AMBULATORY_CARE_PROVIDER_SITE_OTHER): Payer: Medicare Other | Admitting: *Deleted

## 2017-12-10 VITALS — BP 119/70 | HR 57 | Ht 64.0 in | Wt 208.6 lb

## 2017-12-10 DIAGNOSIS — Z0000 Encounter for general adult medical examination without abnormal findings: Secondary | ICD-10-CM

## 2017-12-10 DIAGNOSIS — Z23 Encounter for immunization: Secondary | ICD-10-CM

## 2017-12-10 NOTE — Patient Instructions (Addendum)
Keep Follow up appointment with Dr. Raymondo Band vaccine done today  Flu Vaccine to be done at office visit with Dr. Christell Constant     [name] , Thank you for taking time to come for your Medicare Wellness Visit. I appreciate your ongoing commitment to your health goals. Please review the following plan we discussed and let me know if I can assist you in the future.   These are the goals we discussed: Goals    . Have 3 meals a day     Eat 3 meals daily that consist of lean proteins, fruits and vegetables Decrease Carbohydrate intake Decrease Soda and Tea Intake Increase Water intake        This is a list of the screening recommended for you and due dates:  Health Maintenance  Topic Date Due  . Flu Shot  02/13/2018*  . Tetanus Vaccine  05/09/2018*  . Mammogram  06/25/2018  . DEXA scan (bone density measurement)  Completed  . Pneumonia vaccines  Completed  *Topic was postponed. The date shown is not the original due date.     Advance Directive Advance directives are legal documents that let you make choices ahead of time about your health care and medical treatment in case you become unable to communicate for yourself. Advance directives are a way for you to communicate your wishes to family, friends, and health care providers. This can help convey your decisions about end-of-life care if you become unable to communicate. Discussing and writing advance directives should happen over time rather than all at once. Advance directives can be changed depending on your situation and what you want, even after you have signed the advance directives. If you do not have an advance directive, some states assign family decision makers to act on your behalf based on how closely you are related to them. Each state has its own laws regarding advance directives. You may want to check with your health care provider, attorney, or state representative about the laws in your state. There are different types  of advance directives, such as:  Medical power of attorney.  Living will.  Do not resuscitate (DNR) or do not attempt resuscitation (DNAR) order.  Health care proxy and medical power of attorney A health care proxy, also called a health care agent, is a person who is appointed to make medical decisions for you in cases in which you are unable to make the decisions yourself. Generally, people choose someone they know well and trust to represent their preferences. Make sure to ask this person for an agreement to act as your proxy. A proxy may have to exercise judgment in the event of a medical decision for which your wishes are not known. A medical power of attorney is a legal document that names your health care proxy. Depending on the laws in your state, after the document is written, it may also need to be:  Signed.  Notarized.  Dated.  Copied.  Witnessed.  Incorporated into your medical record.  You may also want to appoint someone to manage your financial affairs in a situation in which you are unable to do so. This is called a durable power of attorney for finances. It is a separate legal document from the durable power of attorney for health care. You may choose the same person or someone different from your health care proxy to act as your agent in financial matters. If you do not appoint a proxy, or if there is a  concern that the proxy is not acting in your best interests, a court-appointed guardian may be designated to act on your behalf. Living will A living will is a set of instructions documenting your wishes about medical care when you cannot express them yourself. Health care providers should keep a copy of your living will in your medical record. You may want to give a copy to family members or friends. To alert caregivers in case of an emergency, you can place a card in your wallet to let them know that you have a living will and where they can find it. A living will is used  if you become:  Terminally ill.  Incapacitated.  Unable to communicate or make decisions.  Items to consider in your living will include:  The use or non-use of life-sustaining equipment, such as dialysis machines and breathing machines (ventilators).  A DNR or DNAR order, which is the instruction not to use cardiopulmonary resuscitation (CPR) if breathing or heartbeat stops.  The use or non-use of tube feeding.  Withholding of food and fluids.  Comfort (palliative) care when the goal becomes comfort rather than a cure.  Organ and tissue donation.  A living will does not give instructions for distributing your money and property if you should pass away. It is recommended that you seek the advice of a lawyer when writing a will. Decisions about taxes, beneficiaries, and asset distribution will be legally binding. This process can relieve your family and friends of any concerns surrounding disputes or questions that may come up about the distribution of your assets. DNR or DNAR A DNR or DNAR order is a request not to have CPR in the event that your heart stops beating or you stop breathing. If a DNR or DNAR order has not been made and shared, a health care provider will try to help any patient whose heart has stopped or who has stopped breathing. If you plan to have surgery, talk with your health care provider about how your DNR or DNAR order will be followed if problems occur. Summary  Advance directives are the legal documents that allow you to make choices ahead of time about your health care and medical treatment in case you become unable to communicate for yourself.  The process of discussing and writing advance directives should happen over time. You can change the advance directives, even after you have signed them.  Advance directives include DNR or DNAR orders, living wills, and designating an agent as your medical power of attorney. This information is not intended to replace  advice given to you by your health care provider. Make sure you discuss any questions you have with your health care provider. Document Released: 06/26/2007 Document Revised: 02/06/2016 Document Reviewed: 02/06/2016 Elsevier Interactive Patient Education  2017 [employer].

## 2017-12-10 NOTE — Progress Notes (Addendum)
Subjective:   AISSATOU FRONCZAK is a 84 y.o. female who presents for Medicare Annual (Subsequent) preventive examination.  Ms. Troupe is a divorced female that lives home alone.  She has 2 children and 3 grandchildren that she sees often.   Ms. Degrasse retired from [employer] of 20 years in 1997 and then went on to work at [employer] for 16 years.  Ms. Laplant enjoys gardening flowers, walking and spending time with her family.  She attends church every Sunday unless she is sick.  Ms. Alkins eats 3 unhealthy meals daily and tries to walk for at least 30 minutes daily.  She has had no hospitalization, ER visits or surgeries in the past year and overall feels that her health is about the same as it was a year ago.   Objective:     Vitals: BP 119/70   Pulse (!) 57   Ht 5\' 4"  (1.626 m)   Wt 208 lb 9.6 oz (94.6 kg)   BMI 35.81 kg/m   Body mass index is 35.81 kg/m.  Advanced Directives 12/10/2017 01/06/2015  Does Patient Have a Medical Advance Directive? No No  Would patient like information on creating a medical advance directive? Yes (MAU/Ambulatory/Procedural Areas - Information given) No - patient declined information  Hand Out Given  Tobacco Social History   Tobacco Use  Smoking Status Never Smoker  Smokeless Tobacco Never Used     No tobacco use  Clinical Intake:  Pre-visit preparation completed: No  Pain : No/denies pain     BMI - recorded: 35.2 Nutritional Status: BMI > 30  Obese Nutritional Risks: None Diabetes: No  How often do you need to have someone help you when you read instructions, pamphlets, or other written materials from your doctor or pharmacy?: 1 - Never What is the last grade level you completed in school?: 10th grade  Interpreter Needed?: No  Information entered by :: Josetta Huddle, LPN  Past Medical History:  Diagnosis Date  . AF (atrial fibrillation) ([facility])   . Arthritis   . Diaphragmatic hernia without mention of obstruction or gangrene   . Esophagitis     . First degree atrioventricular block   . Gastric polyp   . GERD (gastroesophageal reflux disease)   . Heme positive stool   . Hyperlipidemia   . Hypertension   . Hypothyroid   . Prolapse of vaginal walls without mention of uterine prolapse   . Rectal polyp    Past Surgical History:  Procedure Laterality Date  . DILATION AND CURETTAGE OF UTERUS    . KIDNEY STONE SURGERY     Family History  Problem Relation Age of Onset  . Hypertension Mother   . Aneurysm Mother        Brain  . Hypertension Father   . Kidney disease Father   . Aneurysm Father        heart  . Arthritis Daughter   . Cancer Daughter        breast  . Hyperlipidemia Sister    Social History   Socioeconomic History  . Marital status: Divorced    Spouse name: Not on file  . Number of children: 2  . Years of education: 10th grade  . Highest education level: 10th grade  Occupational History  . Occupation: retired    Comment: [employer]  Social Needs  . Financial resource strain: Not hard at all  . Food insecurity:    Worry: Never true    Inability: Never true  .  Transportation needs:    Medical: No    Non-medical: No  Tobacco Use  . Smoking status: Never Smoker  . Smokeless tobacco: Never Used  Substance and Sexual Activity  . Alcohol use: No  . Drug use: No  . Sexual activity: Not Currently  Lifestyle  . Physical activity:    Days per week: 7 days    Minutes per session: 30 min  . Stress: Not at all  Relationships  . Social connections:    Talks on phone: More than three times a week    Gets together: Three times a week    Attends religious service: More than 4 times per year    Active member of club or organization: No    Attends meetings of clubs or organizations: Never    Relationship status: Divorced  Other Topics Concern  . Not on file  Social History Narrative  . Not on file    Outpatient Encounter Medications as of 12/10/2017  Medication Sig  . albuterol (PROVENTIL HFA;VENTOLIN  HFA) 108 (90 Base) MCG/ACT inhaler Inhale 2 puffs into the lungs every 6 (six) hours as needed for wheezing or shortness of breath.  . calcium-vitamin D (OSCAL WITH D) 500-200 MG-UNIT per tablet Take 1 tablet by mouth daily.  . Cholecalciferol (VITAMIN D3) 2000 UNITS TABS Take 1 tablet by mouth daily.    Marland Kitchen ELIQUIS 5 MG TABS tablet TAKE 1 TABLET BY MOUTH TWICE DAILY  . ferrous fumarate-iron polysaccharide complex (TANDEM) 162-115.2 MG CAPS capsule Take 1 capsule by mouth daily with breakfast.  . fluticasone (FLONASE) 50 MCG/ACT nasal spray Place 2 sprays into both nostrils daily.  . furosemide (LASIX) 40 MG tablet Take 1 tablet (40 mg total) by mouth daily.  Marland Kitchen gabapentin (NEURONTIN) 100 MG capsule Take 1 capsule (100 mg total) by mouth 2 (two) times daily.  Marland Kitchen KLOR-CON 10 10 MEQ tablet Take 1 tablet (10 mEq total) by mouth daily.  Marland Kitchen levothyroxine (SYNTHROID, LEVOTHROID) 137 MCG tablet TAKE 1 TABLET BY MOUTH ONCE DAILY AS  DIRECTED  . lisinopril (PRINIVIL,ZESTRIL) 40 MG tablet Take 1 tablet (40 mg total) by mouth daily.  . Multiple Vitamins-Minerals (CENTRUM SILVER) tablet Take 1 tablet by mouth daily.   Marland Kitchen omeprazole (PRILOSEC) 20 MG capsule TAKE 1 CAPSULE BY MOUTH TWICE DAILY BEFORE  A  MEAL  . PARoxetine (PAXIL) 10 MG tablet Take 1 tablet (10 mg total) by mouth daily.  . polyethylene glycol powder (GLYCOLAX/MIRALAX) powder DISSOLVE 17 GRAMS (ONE CAPFUL) OF POWDER IN WATER & DRINK ONCE DAILY AS NEEDED  . LORazepam (ATIVAN) 0.5 MG tablet One half tablet to one daily if needed for anxiety (Patient not taking: Reported on 12/10/2017)  . [DISCONTINUED] meloxicam (MOBIC) 15 MG tablet Take 1 tablet (15 mg total) daily by mouth. (Patient not taking: Reported on 12/10/2017)  . [DISCONTINUED] nystatin (MYCOSTATIN) 100000 UNIT/ML suspension Take 5 mLs (500,000 Units total) by mouth 4 (four) times daily. (Patient not taking: Reported on 12/10/2017)   Facility-Administered Encounter Medications as of 12/10/2017    Medication  . cyanocobalamin ((VITAMIN B-12)) injection 1,000 mcg    Activities of Daily Living In your present state of health, do you have any difficulty performing the following activities: 12/10/2017  Hearing? Y  Comment Wears hearing aid  Vision? N  Difficulty concentrating or making decisions? Y  Comment Trouble Concentratin  Walking or climbing stairs? N  Dressing or bathing? N  Doing errands, shopping? N  Some recent data might be hidden  Some  difficulties with ADLs noted at this time  Patient Care Team: Ernestina Penna, MD as PCP - General (Family Medicine) Rollene Rotunda, MD as Consulting Physician (Cardiology) Normajean Glasgow, MD (Pain Medicine)    Assessment:   This is a routine wellness examination for Molli.  Exercise Activities and Dietary recommendations Current Exercise Habits: Home exercise routine, Type of exercise: walking, Time (Minutes): 30, Frequency (Times/Week): 7, Weekly Exercise (Minutes/Week): 210, Intensity: Mild  Goals    . Have 3 meals a day     Eat 3 meals daily that consist of lean proteins, fruits and vegetables Decrease Carbohydrate intake Decrease Soda and Tea Intake Increase Water intake        Fall Risk Fall Risk  12/10/2017 11/13/2017 10/18/2017 10/11/2017 10/08/2017  Falls in the past year? No No No No No  Number falls in past yr: - - - - -  Injury with Fall? - - - - -  Risk Factor Category  - - - - -  Follow up - - - - -   Is the patient's home free of loose throw rugs in walkways, pet beds, electrical cords, etc?   yes      Grab bars in the bathroom? yes      Handrails on the stairs?   yes      Adequate lighting?   yes  Timed Get Up and Go performed:   Depression Screen PHQ 2/9 Scores 12/10/2017 11/13/2017 10/18/2017 10/08/2017  PHQ - 2 Score 1 1 2 1   PHQ- 9 Score 1 - 6 -   NO depression noted at this time  Cognitive Function MMSE - Mini Mental State Exam 12/10/2017 01/06/2015  Orientation to time 5 5  Orientation to  Place 5 5  Registration 3 3  Attention/ Calculation 5 5  Recall 3 3  Language- name 2 objects 2 2  Language- repeat 1 1  Language- follow 3 step command 3 3  Language- read & follow direction 1 1  Write a sentence 1 1  Copy design 1 1  Total score 30 30  No memory loss noted at this time       Immunization History  Administered Date(s) Administered  . Influenza Whole 12/31/2009  . Influenza, High Dose Seasonal PF 12/29/2015, 01/09/2017  . Influenza,inj,Quad PF,6+ Mos 12/24/2012, 12/28/2013, 01/06/2015  . Pneumococcal Conjugate-13 03/16/2013  . Pneumococcal Polysaccharide-23 01/31/2001  . Td 12/01/2004  . Tdap 12/10/2017  . Zoster 03/02/2006  Tdap given today.  Flu vaccine to be done at next office visit  Qualifies for Shingles Vaccine? Zostavax done, Declined Shingrix at this time.   Screening Tests Health Maintenance  Topic Date Due  . INFLUENZA VACCINE  02/13/2018 (Originally 10/31/2017)  . TETANUS/TDAP  05/09/2018 (Originally 12/02/2014)  . MAMMOGRAM  06/25/2018  . DEXA SCAN  Completed  . PNA vac Low Risk Adult  Completed  Tdap given today 12/10/17 Flu Vaccine to be done at next office visit  Cancer Screenings: Lung: Low Dose CT Chest recommended if Age 18-80 years, 30 pack-year currently smoking OR have quit w/in 15years. Patient does not qualify. Breast:  Up to date on Mammogram? Yes   Up to date of Bone Density/Dexa? Yes Colorectal: Up to date  Additional Screenings:  Hepatitis C Screening:      Plan:  Encouraged Ms. Kinnick to continue to exercise for at least 30 minutes 3 times weekly.  Encouraged to eat 3 meals daily that consist of lean proteins, fruits and vegetables.  Encouraged to  decrease carbohydrates, soda and tea intake and to increase water intake.  Tetanus vaccine given today and explained the importance of flu vaccine and shingrix, will discuss at next office visit.  Advance directive explained, hand out given and encouraged to read over and discuss  with family. Encouraged Ms. Lesnick to keep follow up appointment with Dr. Christell Constant on 02/13/18 and to call this office with any further questions or concerns.    I have personally reviewed and noted the following in the patient's chart:   . Medical and social history . Use of alcohol, tobacco or illicit drugs  . Current medications and supplements . Functional ability and status . Nutritional status . Physical activity . Advanced directives . List of other physicians . Hospitalizations, surgeries, and ER visits in previous 12 months . Vitals . Screenings to include cognitive, depression, and falls . Referrals and appointments  In addition, I have reviewed and discussed with patient certain preventive protocols, quality metrics, and best practice recommendations. A written personalized care plan for preventive services as well as general preventive health recommendations were provided to patient.     Caryl Bis, LPN  11/29/5619   I have reviewed and agree with the above AWV documentation.   Nyra Capes MD

## 2017-12-20 ENCOUNTER — Other Ambulatory Visit: Payer: Medicare Other

## 2017-12-20 DIAGNOSIS — N289 Disorder of kidney and ureter, unspecified: Secondary | ICD-10-CM | POA: Diagnosis not present

## 2017-12-20 LAB — BMP8+EGFR
BUN / CREAT RATIO: 16 (ref 12–28)
BUN: 23 mg/dL (ref 8–27)
CO2: 26 mmol/L (ref 20–29)
CREATININE: 1.46 mg/dL — AB (ref 0.57–1.00)
Calcium: 9.7 mg/dL (ref 8.7–10.3)
Chloride: 100 mmol/L (ref 96–106)
GFR calc Af Amer: 38 mL/min/{1.73_m2} — ABNORMAL LOW (ref >59)
GFR, EST NON AFRICAN AMERICAN: 33 mL/min/{1.73_m2} — AB (ref >59)
Glucose: 96 mg/dL (ref 65–99)
POTASSIUM: 4.8 mmol/L (ref 3.5–5.2)
SODIUM: 143 mmol/L (ref 134–144)

## 2018-01-07 DIAGNOSIS — L84 Corns and callosities: Secondary | ICD-10-CM | POA: Diagnosis not present

## 2018-01-07 DIAGNOSIS — I70203 Unspecified atherosclerosis of native arteries of extremities, bilateral legs: Secondary | ICD-10-CM | POA: Diagnosis not present

## 2018-01-07 DIAGNOSIS — B351 Tinea unguium: Secondary | ICD-10-CM | POA: Diagnosis not present

## 2018-01-07 DIAGNOSIS — M79676 Pain in unspecified toe(s): Secondary | ICD-10-CM | POA: Diagnosis not present

## 2018-01-08 ENCOUNTER — Other Ambulatory Visit: Payer: Self-pay | Admitting: Family Medicine

## 2018-01-08 DIAGNOSIS — K2100 Gastro-esophageal reflux disease with esophagitis, without bleeding: Secondary | ICD-10-CM

## 2018-01-09 ENCOUNTER — Ambulatory Visit (INDEPENDENT_AMBULATORY_CARE_PROVIDER_SITE_OTHER): Payer: Medicare Other | Admitting: *Deleted

## 2018-01-09 DIAGNOSIS — Z23 Encounter for immunization: Secondary | ICD-10-CM

## 2018-01-09 DIAGNOSIS — E538 Deficiency of other specified B group vitamins: Secondary | ICD-10-CM

## 2018-01-09 NOTE — Progress Notes (Signed)
Pt given cyanocobalamin inj Tolerated well 

## 2018-01-20 ENCOUNTER — Encounter: Payer: Self-pay | Admitting: Family Medicine

## 2018-01-20 ENCOUNTER — Ambulatory Visit (INDEPENDENT_AMBULATORY_CARE_PROVIDER_SITE_OTHER): Payer: Medicare Other | Admitting: Family Medicine

## 2018-01-20 VITALS — BP 151/60 | HR 56 | Temp 96.9°F | Ht 64.0 in | Wt 207.0 lb

## 2018-01-20 DIAGNOSIS — R05 Cough: Secondary | ICD-10-CM

## 2018-01-20 DIAGNOSIS — R0981 Nasal congestion: Secondary | ICD-10-CM

## 2018-01-20 DIAGNOSIS — J069 Acute upper respiratory infection, unspecified: Secondary | ICD-10-CM

## 2018-01-20 DIAGNOSIS — R059 Cough, unspecified: Secondary | ICD-10-CM

## 2018-01-20 MED ORDER — PREDNISONE 10 MG PO TABS: ORAL_TABLET | ORAL | 0 refills | Status: DC

## 2018-01-20 MED ORDER — FLUTICASONE PROPIONATE 50 MCG/ACT NA SUSP: 2.0000 | Freq: Every day | NASAL | 6 refills | Status: DC

## 2018-01-20 NOTE — Patient Instructions (Signed)
Use nasal saline 1 spray each nostril 4 times daily Drink plenty of fluids and stay well-hydrated Take Mucinex, blue and white in color, maximum strength, plain, 1 twice daily with a large glass of water to keep chest congestion loose Use Flonase 1 spray each nostril at bedtime Please call us back if there is any worsening in condition or especially if you start running any fever Take Tylenol for aches and pains especially muscle aches and pains.

## 2018-01-20 NOTE — Progress Notes (Signed)
Subjective:    Patient ID: [name], female    DOB: 05-10-1932, 84 y.o.   MRN: [id number]  HPI Patient here today for sinus issues: cough, facial pressure and bloody drainage. She states that this started about 6 days ago.  The patient has had some cold type symptoms and wonders if she may have a sinus infection.  She has had facial pressure some bleeding and drainage from her sinuses.  Her eyes have been watering.  She has not had a fever.  This started about a week ago.  The patient says that some recent hedge and tree trimming was done.  She also complains of muscle aches and myalgias.  She says she may have had some low-grade fever.  She has had some cough but this seems to be getting better.  The drainage has been blood tinged at times and may be slightly gray and slightly yellow in color.  She does not use an overhead fan but the heat and air conditioning come out of the same unit and I told her to keep the house as cool as possible she keeps a temperature sitting around 68 degrees.   Patient Active Problem List   Diagnosis Date Noted  . Non-rheumatic mitral regurgitation 03/20/2017  . Thrombocytopenia (HCC) 09/14/2016  . Hemoglobin decreased 09/14/2016  . Hearing loss 01/06/2015  . Major depressive disorder, recurrent episode, moderate (HCC) 06/07/2014  . Thoracic aorta atherosclerosis (HCC) 03/04/2014  . Degenerative arthritis of thoracic spine 03/04/2014  . Osteoporosis, post-menopausal 08/12/2013  . Metabolic syndrome 06/23/2013  . Anemia, iron deficiency 03/16/2013  . Vitamin D deficiency 03/16/2013  . HTN (hypertension) 03/16/2013  . Hypothyroidism   . Diaphragmatic hernia without mention of obstruction or gangrene   . GERD (gastroesophageal reflux disease)   . Hyperlipidemia   . Prolapse of vaginal walls without mention of uterine prolapse   . Arthritis   . First degree atrioventricular block   . Symptomatic menopausal or female climacteric states   . AF (atrial  fibrillation) (HCC)   . Rectal polyp   . Gastric polyp    Outpatient Encounter Medications as of 01/20/2018  Medication Sig  . albuterol (PROVENTIL HFA;VENTOLIN HFA) 108 (90 Base) MCG/ACT inhaler Inhale 2 puffs into the lungs every 6 (six) hours as needed for wheezing or shortness of breath.  . calcium-vitamin D (OSCAL WITH D) 500-200 MG-UNIT per tablet Take 1 tablet by mouth daily.  . Cholecalciferol (VITAMIN D3) 2000 UNITS TABS Take 1 tablet by mouth daily.    Marland Kitchen ELIQUIS 5 MG TABS tablet TAKE 1 TABLET BY MOUTH TWICE DAILY  . ferrous fumarate-iron polysaccharide complex (TANDEM) 162-115.2 MG CAPS capsule Take 1 capsule by mouth daily with breakfast.  . fluticasone (FLONASE) 50 MCG/ACT nasal spray Place 2 sprays into both nostrils daily.  . furosemide (LASIX) 40 MG tablet Take 1 tablet (40 mg total) by mouth daily.  Marland Kitchen gabapentin (NEURONTIN) 100 MG capsule Take 1 capsule (100 mg total) by mouth 2 (two) times daily.  Marland Kitchen KLOR-CON 10 10 MEQ tablet Take 1 tablet (10 mEq total) by mouth daily.  Marland Kitchen levothyroxine (SYNTHROID, LEVOTHROID) 137 MCG tablet TAKE 1 TABLET BY MOUTH ONCE DAILY AS  DIRECTED  . lisinopril (PRINIVIL,ZESTRIL) 40 MG tablet Take 1 tablet (40 mg total) by mouth daily.  Marland Kitchen LORazepam (ATIVAN) 0.5 MG tablet One half tablet to one daily if needed for anxiety  . Multiple Vitamins-Minerals (CENTRUM SILVER) tablet Take 1 tablet by mouth daily.   Marland Kitchen omeprazole (  PRILOSEC) 20 MG capsule TAKE 1 CAPSULE BY MOUTH TWICE DAILY BEFORE A MEAL  . PARoxetine (PAXIL) 10 MG tablet Take 1 tablet (10 mg total) by mouth daily.  . polyethylene glycol powder (GLYCOLAX/MIRALAX) powder DISSOLVE 17 GRAMS (ONE CAPFUL) OF POWDER IN WATER & DRINK ONCE DAILY AS NEEDED   Facility-Administered Encounter Medications as of 01/20/2018  Medication  . cyanocobalamin ((VITAMIN B-12)) injection 1,000 mcg      Review of Systems  Constitutional: Negative.  Negative for fever.  HENT: Positive for congestion, postnasal drip  (blood tinged), sinus pressure and sneezing.   Eyes: Negative.   Respiratory: Positive for cough.   Cardiovascular: Negative.   Gastrointestinal: Negative.   Endocrine: Negative.   Genitourinary: Negative.   Musculoskeletal: Negative.   Skin: Negative.   Allergic/Immunologic: Negative.   Neurological: Negative.   Hematological: Negative.   Psychiatric/Behavioral: Negative.        Objective:   Physical Exam  Constitutional: She is oriented to person, place, and time. She appears well-developed and well-nourished. No distress.  The patient is pleasant and alert  HENT:  Head: Normocephalic and atraumatic.  Right Ear: External ear normal.  Left Ear: External ear normal.  Nose: Nose normal.  Mouth/Throat: Oropharynx is clear and moist. No oropharyngeal exudate.  Minimal frontal ethmoid and maxillary tenderness with palpation.  Nasal pallor with minimal or no drainage noted.  Eyes: Pupils are equal, round, and reactive to light. Conjunctivae and EOM are normal. Right eye exhibits no discharge. Left eye exhibits no discharge. No scleral icterus.  Neck: Normal range of motion. Neck supple. No thyromegaly present.  No anterior cervical adenopathy  Cardiovascular: Normal rate, regular rhythm and normal heart sounds.  No murmur heard. The heart had a regular rate and rhythm at 56 to 60/min.  Pulmonary/Chest: Effort normal and breath sounds normal. She has no wheezes. She has no rales.  Slightly tight cough with coughing but no rales or wheezes heard.  Abdominal: There is no rebound.  Musculoskeletal: Normal range of motion. She exhibits no edema.  Lymphadenopathy:    She has no cervical adenopathy.  Neurological: She is alert and oriented to person, place, and time. She has normal reflexes.  Skin: Skin is warm and dry. No rash noted.  Psychiatric: She has a normal mood and affect. Her behavior is normal. Judgment and thought content normal.  Patient's mood affect and behavior were  normal for her.  Nursing note and vitals reviewed.  BP (!) 151/60 (BP Location: Left Arm)   Pulse (!) 56   Temp (!) 96.9 F (36.1 C) (Oral)   Ht 5\' 4"  (1.626 m)   Wt 207 lb (93.9 kg)   BMI 35.53 kg/m         Assessment & Plan:  1. Cough -Take Mucinex, maximum strength, blue and white in color, 1 twice daily with a large glass of water on a regular basis for cough and congestion  2. Nasal congestion -Use nasal saline and use this 1 spray each nostril 4 times daily along with using Flonase 1 spray each nostril at bedtime -Take prednisone as directed  3. Viral upper respiratory tract infection -Drink plenty of fluids and stay well-hydrated -Take Tylenol for muscle aches and myalgias - fluticasone (FLONASE) 50 MCG/ACT nasal spray; Place 2 sprays into both nostrils daily.  Dispense: 16 g; Refill: 6   Meds ordered this encounter  Medications  . fluticasone (FLONASE) 50 MCG/ACT nasal spray    Sig: Place 2 sprays into both nostrils  daily.    Dispense:  16 g    Refill:  6  . predniSONE (DELTASONE) 10 MG tablet    Sig: Take 1 tab QID x 2 days, then 1 tab TID x 2 days, then 1 tab BID x 2 days, then 1 tab QD x 2 days    Dispense:  20 tablet    Refill:  0   Patient Instructions  Use nasal saline 1 spray each nostril 4 times daily Drink plenty of fluids and stay well-hydrated Take Mucinex, blue and white in color, maximum strength, plain, 1 twice daily with a large glass of water to keep chest congestion loose Use Flonase 1 spray each nostril at bedtime Please call us back if there is any worsening in condition or especially if you start running any fever Take Tylenol for aches and pains especially muscle aches and pains.  Nyra Capes MD

## 2018-01-24 ENCOUNTER — Other Ambulatory Visit: Payer: Self-pay | Admitting: Neurology

## 2018-01-24 ENCOUNTER — Other Ambulatory Visit: Payer: Self-pay | Admitting: Cardiology

## 2018-01-29 ENCOUNTER — Ambulatory Visit (INDEPENDENT_AMBULATORY_CARE_PROVIDER_SITE_OTHER): Payer: Medicare Other | Admitting: Family Medicine

## 2018-01-29 ENCOUNTER — Encounter: Payer: Self-pay | Admitting: Family Medicine

## 2018-01-29 VITALS — BP 115/76 | HR 62 | Temp 97.9°F | Resp 20 | Ht 64.0 in | Wt 205.4 lb

## 2018-01-29 DIAGNOSIS — J40 Bronchitis, not specified as acute or chronic: Secondary | ICD-10-CM | POA: Diagnosis not present

## 2018-01-29 MED ORDER — ALBUTEROL SULFATE HFA 108 (90 BASE) MCG/ACT IN AERS: 2.0000 | INHALATION_SPRAY | Freq: Four times a day (QID) | RESPIRATORY_TRACT | 0 refills | Status: DC | PRN

## 2018-01-29 MED ORDER — AMOXICILLIN-POT CLAVULANATE 875-125 MG PO TABS: 1.0000 | ORAL_TABLET | Freq: Two times a day (BID) | ORAL | 0 refills | Status: DC

## 2018-01-29 NOTE — Progress Notes (Signed)
BP 115/76   Pulse 62   Temp 97.9 F (36.6 C) (Oral)   Resp 20   Ht 5\' 4"  (1.626 m)   Wt 205 lb 6.4 oz (93.2 kg)   SpO2 99%   BMI 35.26 kg/m    Subjective:    Patient ID: [name], female    DOB: 02-07-1933, 84 y.o.   MRN: [id number]  HPI: [name] is a 84 y.o. female presenting on 01/29/2018 for Nasal Congestion; Facial Pain; and Headache   HPI Cough congestion and facial pain and chest tightness Patient comes in today complaining of almost 2 weeks worth of cough and congestion and headache and sinus pressure and nasal drainage and sore throat.  She was seen 9 days ago and was given prednisone and recommended Mucinex and Flonase which she has been using and it does not seem to be helping and she feels like she is getting more down into her chest and more tightness and she is still having a sore throat and the congestion in the sinus pressure and headache through her frontal region and ethmoid region and headaches to the top of her head as well.  She denies any fevers or chills or shortness of breath.  She thinks she may have had some wheezing off and on.  She does have some pain in her right posterior back from coughing between the ribs.  Her cough is been mostly nonproductive at this point.  Relevant past medical, surgical, family and social history reviewed and updated as indicated. Interim medical history since our last visit reviewed. Allergies and medications reviewed and updated.  Review of Systems  Constitutional: Negative for chills and fever.  HENT: Positive for congestion, postnasal drip, rhinorrhea, sinus pressure, sneezing and sore throat. Negative for ear discharge and ear pain.   Eyes: Negative for visual disturbance.  Respiratory: Positive for cough and wheezing. Negative for chest tightness and shortness of breath.   Cardiovascular: Negative for chest pain and leg swelling.  Genitourinary: Negative for difficulty urinating and dysuria.  Musculoskeletal:  Negative for back pain and gait problem.  Skin: Negative for rash.  Neurological: Negative for light-headedness and headaches.  Psychiatric/Behavioral: Negative for agitation and behavioral problems.  All other systems reviewed and are negative.   Per HPI unless specifically indicated above        Objective:    BP 115/76   Pulse 62   Temp 97.9 F (36.6 C) (Oral)   Resp 20   Ht 5\' 4"  (1.626 m)   Wt 205 lb 6.4 oz (93.2 kg)   SpO2 99%   BMI 35.26 kg/m   Wt Readings from Last 3 Encounters:  01/29/18 205 lb 6.4 oz (93.2 kg)  01/20/18 207 lb (93.9 kg)  12/10/17 208 lb 9.6 oz (94.6 kg)    Physical Exam  Constitutional: She is oriented to person, place, and time. She appears well-developed and well-nourished. No distress.  HENT:  Right Ear: Tympanic membrane, external ear and ear canal normal.  Left Ear: Tympanic membrane, external ear and ear canal normal.  Nose: Mucosal edema and rhinorrhea present. No epistaxis. Right sinus exhibits no maxillary sinus tenderness and no frontal sinus tenderness. Left sinus exhibits no maxillary sinus tenderness and no frontal sinus tenderness.  Mouth/Throat: Uvula is midline and mucous membranes are normal. Posterior oropharyngeal edema and posterior oropharyngeal erythema present. No oropharyngeal exudate or tonsillar abscesses.  Eyes: Conjunctivae are normal.  Cardiovascular: Normal rate, regular rhythm, normal heart sounds and intact  distal pulses.  No murmur heard. Pulmonary/Chest: Effort normal and breath sounds normal. No respiratory distress. She has no wheezes.  Musculoskeletal: Normal range of motion.  Neurological: She is alert and oriented to person, place, and time. Coordination normal.  Skin: Skin is warm and dry. No rash noted. She is not diaphoretic.  Psychiatric: She has a normal mood and affect. Her behavior is normal.  Vitals reviewed.       Assessment & Plan:   Problem List Items Addressed This Visit    None      Visit Diagnoses    Bronchitis    -  Primary   Relevant Medications   albuterol (PROVENTIL HFA;VENTOLIN HFA) 108 (90 Base) MCG/ACT inhaler   amoxicillin-clavulanate (AUGMENTIN) 875-125 MG tablet      Sent Augmentin and albuterol for the patient, concerned that is possibly developing into a pneumonia Follow up plan: Return if symptoms worsen or fail to improve.  Counseling provided for all of the vaccine components No orders of the defined types were placed in this encounter.   Arville Care, MD [facility] 01/29/2018, 5:59 PM

## 2018-01-31 ENCOUNTER — Other Ambulatory Visit: Payer: Self-pay | Admitting: Family Medicine

## 2018-01-31 DIAGNOSIS — I10 Essential (primary) hypertension: Secondary | ICD-10-CM

## 2018-02-04 DIAGNOSIS — J322 Chronic ethmoidal sinusitis: Secondary | ICD-10-CM | POA: Diagnosis not present

## 2018-02-04 DIAGNOSIS — J320 Chronic maxillary sinusitis: Secondary | ICD-10-CM | POA: Diagnosis not present

## 2018-02-04 DIAGNOSIS — J040 Acute laryngitis: Secondary | ICD-10-CM | POA: Diagnosis not present

## 2018-02-04 DIAGNOSIS — H903 Sensorineural hearing loss, bilateral: Secondary | ICD-10-CM | POA: Diagnosis not present

## 2018-02-10 ENCOUNTER — Ambulatory Visit (INDEPENDENT_AMBULATORY_CARE_PROVIDER_SITE_OTHER): Payer: Medicare Other | Admitting: *Deleted

## 2018-02-10 DIAGNOSIS — E538 Deficiency of other specified B group vitamins: Secondary | ICD-10-CM

## 2018-02-10 NOTE — Progress Notes (Signed)
Pt given Cyanocobalamin inj Tolerated well 

## 2018-02-13 ENCOUNTER — Encounter: Payer: Self-pay | Admitting: Family Medicine

## 2018-02-13 ENCOUNTER — Ambulatory Visit (INDEPENDENT_AMBULATORY_CARE_PROVIDER_SITE_OTHER): Payer: Medicare Other

## 2018-02-13 ENCOUNTER — Ambulatory Visit (INDEPENDENT_AMBULATORY_CARE_PROVIDER_SITE_OTHER): Payer: Medicare Other | Admitting: Family Medicine

## 2018-02-13 VITALS — BP 138/59 | HR 58 | Temp 97.0°F | Ht 64.0 in | Wt 206.0 lb

## 2018-02-13 DIAGNOSIS — Z780 Asymptomatic menopausal state: Secondary | ICD-10-CM

## 2018-02-13 DIAGNOSIS — K2100 Gastro-esophageal reflux disease with esophagitis, without bleeding: Secondary | ICD-10-CM

## 2018-02-13 DIAGNOSIS — Z13820 Encounter for screening for osteoporosis: Secondary | ICD-10-CM

## 2018-02-13 DIAGNOSIS — E034 Atrophy of thyroid (acquired): Secondary | ICD-10-CM

## 2018-02-13 DIAGNOSIS — E7800 Pure hypercholesterolemia, unspecified: Secondary | ICD-10-CM | POA: Diagnosis not present

## 2018-02-13 DIAGNOSIS — E559 Vitamin D deficiency, unspecified: Secondary | ICD-10-CM

## 2018-02-13 DIAGNOSIS — M810 Age-related osteoporosis without current pathological fracture: Secondary | ICD-10-CM | POA: Diagnosis not present

## 2018-02-13 DIAGNOSIS — E538 Deficiency of other specified B group vitamins: Secondary | ICD-10-CM | POA: Diagnosis not present

## 2018-02-13 DIAGNOSIS — M8589 Other specified disorders of bone density and structure, multiple sites: Secondary | ICD-10-CM | POA: Diagnosis not present

## 2018-02-13 DIAGNOSIS — I10 Essential (primary) hypertension: Secondary | ICD-10-CM

## 2018-02-13 DIAGNOSIS — F4322 Adjustment disorder with anxiety: Secondary | ICD-10-CM

## 2018-02-13 DIAGNOSIS — I700 Atherosclerosis of aorta: Secondary | ICD-10-CM

## 2018-02-13 MED ORDER — LORAZEPAM 0.5 MG PO TABS: ORAL_TABLET | ORAL | 3 refills | Status: DC

## 2018-02-13 NOTE — Progress Notes (Signed)
Subjective:    Patient ID: [name], female    DOB: [birth year], 83 y.o.   MRN: [id number]  HPI Pt here for follow up and management of chronic medical problems which includes hypothyroid, hyperlipidemia and gerd. She is taking medication regularly.  The patient comes with her daughter to the visit today.  She did have an episode in the past couple weeks when she was in [place] and thinks this was anxiety related that her head felt funny with tingling and hurting and she went and took some of her anxiety medicine and this got better.  She does complain of some right mid back pain.  She also complains of trouble swallowing with increased GI burning and belching.  She is also complaining of trouble in her face and jaw and is concerned about sinus congestion and infection.  She is requesting a refill on her Lorazepam.  She did get a DEXA scan today.  According to the notes she is taking omeprazole twice daily.  On looking back, she did have an upper endoscopy by Dr. Teena Irani about 2 years ago and was found to have a large hiatal hernia at that time.  Patient today denies any chest pain pressure tightness or shortness of breath anymore than usual.  She has ongoing problems with swallowing heartburn and belching even though she is taking her omeprazole as directed.  She denies any blood in the stool or black tarry bowel movements.  She is passing her water without problems.  She is still very stressed at times by certain people in her living complex.    Patient Active Problem List   Diagnosis Date Noted  . Non-rheumatic mitral regurgitation 03/20/2017  . Thrombocytopenia ([place]) 09/14/2016  . Hemoglobin decreased 09/14/2016  . Hearing loss 01/06/2015  . Major depressive disorder, recurrent episode, moderate ([place]) 06/07/2014  . Thoracic aorta atherosclerosis ([place]) 03/04/2014  . Degenerative arthritis of thoracic spine 03/04/2014  . Osteoporosis, post-menopausal 08/12/2013  . Metabolic syndrome  76/16/0737  . Anemia, iron deficiency 03/16/2013  . Vitamin D deficiency 03/16/2013  . HTN (hypertension) 03/16/2013  . Hypothyroidism   . Diaphragmatic hernia without mention of obstruction or gangrene   . GERD (gastroesophageal reflux disease)   . Hyperlipidemia   . Prolapse of vaginal walls without mention of uterine prolapse   . Arthritis   . First degree atrioventricular block   . Symptomatic menopausal or female climacteric states   . AF (atrial fibrillation) ([place])   . Rectal polyp   . Gastric polyp    Outpatient Encounter Medications as of 02/13/2018  Medication Sig  . albuterol (PROVENTIL HFA;VENTOLIN HFA) 108 (90 Base) MCG/ACT inhaler Inhale 2 puffs into the lungs every 6 (six) hours as needed for wheezing or shortness of breath.  . calcium-vitamin D (OSCAL WITH D) 500-200 MG-UNIT per tablet Take 1 tablet by mouth daily.  . Cholecalciferol (VITAMIN D3) 2000 UNITS TABS Take 1 tablet by mouth daily.    Marland Kitchen ELIQUIS 5 MG TABS tablet TAKE 1 TABLET BY MOUTH TWICE DAILY  . ferrous fumarate-iron polysaccharide complex (TANDEM) 162-115.2 MG CAPS capsule Take 1 capsule by mouth daily with breakfast.  . fluticasone (FLONASE) 50 MCG/ACT nasal spray Place 2 sprays into both nostrils daily.  . furosemide (LASIX) 40 MG tablet TAKE 1 TABLET BY MOUTH ONCE DAILY  . gabapentin (NEURONTIN) 100 MG capsule Take 1 capsule (100 mg total) by mouth 2 (two) times daily.  Marland Kitchen gabapentin (NEURONTIN) 100 MG capsule TAKE 1 CAPSULE BY  MOUTH TWICE DAILY  . KLOR-CON 10 10 MEQ tablet Take 1 tablet (10 mEq total) by mouth daily.  Marland Kitchen levothyroxine (SYNTHROID, LEVOTHROID) 137 MCG tablet TAKE 1 TABLET BY MOUTH ONCE DAILY AS  DIRECTED  . lisinopril (PRINIVIL,ZESTRIL) 40 MG tablet Take 1 tablet (40 mg total) by mouth daily.  Marland Kitchen LORazepam (ATIVAN) 0.5 MG tablet One half tablet to one daily if needed for anxiety  . Multiple Vitamins-Minerals (CENTRUM SILVER) tablet Take 1 tablet by mouth daily.   Marland Kitchen omeprazole (PRILOSEC) 20  MG capsule TAKE 1 CAPSULE BY MOUTH TWICE DAILY BEFORE A MEAL  . PARoxetine (PAXIL) 10 MG tablet Take 1 tablet (10 mg total) by mouth daily.  . polyethylene glycol powder (GLYCOLAX/MIRALAX) powder DISSOLVE 17 GRAMS (ONE CAPFUL) OF POWDER IN WATER & DRINK ONCE DAILY AS NEEDED  . [DISCONTINUED] amoxicillin-clavulanate (AUGMENTIN) 875-125 MG tablet Take 1 tablet by mouth 2 (two) times daily.   Facility-Administered Encounter Medications as of 02/13/2018  Medication  . cyanocobalamin ((VITAMIN B-12)) injection 1,000 mcg     Review of Systems  Constitutional: Negative.   HENT: Positive for sinus pressure.   Eyes: Negative.   Respiratory: Negative.   Cardiovascular: Negative.   Gastrointestinal: Positive for abdominal pain (burning sensation of upper abd.- about every AM // belching ).       Trouble swallowing meds   Endocrine: Negative.   Genitourinary: Negative.   Musculoskeletal: Positive for arthralgias (right mid back pain).  Skin: Negative.   Allergic/Immunologic: Negative.   Neurological: Negative.        Episode of HA and head tingle  Hematological: Negative.   Psychiatric/Behavioral: The patient is nervous/anxious (anxiety at times).        Objective:   Physical Exam  Constitutional: She is oriented to person, place, and time. She appears well-developed and well-nourished. No distress.  The patient is alert but somewhat stressed by her current living situation.  Her daughter has been helpful to her.  HENT:  Head: Normocephalic and atraumatic.  Right Ear: External ear normal.  Left Ear: External ear normal.  Mouth/Throat: Oropharynx is clear and moist. No oropharyngeal exudate.  Slight nasal turbinate congestion and slight tenderness in the ethmoid area with palpation.  Eyes: Pupils are equal, round, and reactive to light. Conjunctivae and EOM are normal. Right eye exhibits no discharge. Left eye exhibits no discharge. No scleral icterus.  Neck: Normal range of motion. Neck  supple. No thyromegaly present.  Small left anterior cervical node otherwise no thyromegaly.  Cardiovascular: Normal rate, regular rhythm and intact distal pulses.  Murmur heard. Heart is regular with a grade 1/6 systolic ejection murmur at 60/min area pedal edema and no pretibial edema.  Patient wearing support stockings.  Pulmonary/Chest: Effort normal and breath sounds normal. She has no wheezes. She has no rales.  Clear anteriorly and posteriorly  Abdominal: Soft. Bowel sounds are normal. She exhibits no mass. There is no tenderness.  Abdominal obesity with slight epigastric tenderness.  No masses organ enlargement or bruits.  Musculoskeletal: She exhibits edema. She exhibits no tenderness.  Minimal gait unsteadiness and stiffness with arising from a sitting position.  Lymphadenopathy:    She has cervical adenopathy.  Neurological: She is alert and oriented to person, place, and time. She has normal reflexes.  Skin: Skin is warm and dry. No rash noted.  Psychiatric: She has a normal mood and affect. Her behavior is normal. Judgment and thought content normal.  The patient's mood affect and behavior are normal for her.  Nursing note and vitals reviewed.   BP (!) 138/59 (BP Location: Left Arm)   Pulse (!) 58   Temp (!) 97 F (36.1 C) (Oral)   Ht '5\' 4"'  (1.626 m)   Wt 206 lb (93.4 kg)   BMI 35.36 kg/m        Assessment & Plan:  1. Vitamin B 12 deficiency -Continue with B12 supplementation if currently doing that. - CBC with Differential/Platelet  2. Vitamin D deficiency -Continue with vitamin D replacement pending results of lab work - DG [place]; Future - CBC with Differential/Platelet - VITAMIN D 25 Hydroxy (Vit-D Deficiency, Fractures)  3. Pure hypercholesterolemia -Continue with low-cholesterol diet and as aggressive therapeutic lifestyle changes as possible - CBC with Differential/Platelet - Lipid panel  4. Hypothyroidism due to acquired atrophy of  thyroid -Continue with thyroid replacement pending results of lab work - CBC with Differential/Platelet  5. Gastroesophageal reflux disease with esophagitis -Patient has a large hiatal hernia and she will see about getting the head of her bed elevated and we will change the omeprazole to take it before breakfast and at bedtime and will add a Pepcid AC before supper. - CBC with Differential/Platelet  6. Thoracic aorta atherosclerosis ([place]) -Continue aggressive therapeutic lifestyle changes - CBC with Differential/Platelet - Hepatic function panel  7. Essential hypertension -Blood pressure was good today and she will continue with current treatment - BMP8+EGFR - CBC with Differential/Platelet - Hepatic function panel  8. Screening for osteoporosis - DG [facility] DEXA; Future - CBC with Differential/Platelet  9. Postmenopausal - DG [facility] DEXA; Future - CBC with Differential/Platelet  10. Adjustment disorder with anxious mood -Continue with lorazepam as doing and with Paxil for depression. - LORazepam (ATIVAN) 0.5 MG tablet; One half tablet to one daily if needed for anxiety  Dispense: 30 tablet; Refill: 3  11. Morbid obesity ([place]) -Continue to work aggressively to achieve weight loss through diet and exercise as much as possible.   Meds ordered this encounter  Medications  . LORazepam (ATIVAN) 0.5 MG tablet    Sig: One half tablet to one daily if needed for anxiety    Dispense:  30 tablet    Refill:  3   Patient Instructions                       Medicare Annual Wellness Visit  [facility] and the medical providers at [facility] strive to bring you the best medical care.  In doing so we not only want to address your current medical conditions and concerns but also to detect new conditions early and prevent illness, disease and health-related problems.    Medicare offers a yearly Wellness Visit which allows our clinical staff to assess your need for  preventative services including immunizations, lifestyle education, counseling to decrease risk of preventable diseases and screening for fall risk and other medical concerns.    This visit is provided free of charge (no copay) for all Medicare recipients. The clinical pharmacists at [facility] have begun to conduct these Wellness Visits which will also include a thorough review of all your medications.    As you primary medical provider recommend that you make an appointment for your Annual Wellness Visit if you have not done so already this year.  You may set up this appointment before you leave today or you may call back ([phone]) and schedule an appointment.  Please make sure when you call that you mention  that you are scheduling your Annual Wellness Visit with the clinical pharmacist so that the appointment may be made for the proper length of time.    Continue current medications. Continue good therapeutic lifestyle changes which include good diet and exercise. Fall precautions discussed with patient. If an FOBT was given today- please return it to our front desk. If you are over 39 years old - you may need Prevnar 27 or the adult Pneumonia vaccine.  **Flu shots are available--- please call and schedule a FLU-CLINIC appointment**  After your visit with [place] today you will receive a survey in the mail or online from [employer] regarding your care with [place]. Please take a moment to fill this out. Your feedback is very important to [place] as you can help [place] better understand your patient needs as well as improve your experience and satisfaction. WE CARE ABOUT YOU!!!   Use nasal saline 1 spray each nostril 4 times daily Resume using Flonase 1 spray each nostril at bedtime Take omeprazole before breakfast and at bedtime and take a Pepcid AC before dinner Hold the calcium until the next visit Raise the head of the bed if possible on 6 inch blocks Take Tylenol if needed for pain  or fever If you start running fever or drainage turned yellow we may need to put you on an antibiotic he will need to call back and let us know that. Drink plenty of fluids and stay well-hydrated   Arrie Senate MD

## 2018-02-13 NOTE — Patient Instructions (Addendum)
Medicare Annual Wellness Visit  [facility] and the medical providers at [facility] strive to bring you the best medical care.  In doing so we not only want to address your current medical conditions and concerns but also to detect new conditions early and prevent illness, disease and health-related problems.    Medicare offers a yearly Wellness Visit which allows our clinical staff to assess your need for preventative services including immunizations, lifestyle education, counseling to decrease risk of preventable diseases and screening for fall risk and other medical concerns.    This visit is provided free of charge (no copay) for all Medicare recipients. The clinical pharmacists at [facility] have begun to conduct these Wellness Visits which will also include a thorough review of all your medications.    As you primary medical provider recommend that you make an appointment for your Annual Wellness Visit if you have not done so already this year.  You may set up this appointment before you leave today or you may call back [phone] and schedule an appointment.  Please make sure when you call that you mention that you are scheduling your Annual Wellness Visit with the clinical pharmacist so that the appointment may be made for the proper length of time.    Continue current medications. Continue good therapeutic lifestyle changes which include good diet and exercise. Fall precautions discussed with patient. If an FOBT was given today- please return it to our front desk. If you are over 82 years old - you may need Prevnar 13 or the adult Pneumonia vaccine.  **Flu shots are available--- please call and schedule a FLU-CLINIC appointment**  After your visit with us today you will receive a survey in the mail or online from [employer] regarding your care with us. Please take a moment to fill this out. Your feedback is very  important to us as you can help us better understand your patient needs as well as improve your experience and satisfaction. WE CARE ABOUT YOU!!!   Use nasal saline 1 spray each nostril 4 times daily Resume using Flonase 1 spray each nostril at bedtime Take omeprazole before breakfast and at bedtime and take a Pepcid AC before dinner Hold the calcium until the next visit Raise the head of the bed if possible on 6 inch blocks Take Tylenol if needed for pain or fever If you start running fever or drainage turned yellow we may need to put you on an antibiotic he will need to call back and let us know that. Drink plenty of fluids and stay well-hydrated

## 2018-02-14 LAB — LIPID PANEL
CHOLESTEROL TOTAL: 178 mg/dL (ref 100–199)
Chol/HDL Ratio: 2.7 ratio (ref 0.0–4.4)
HDL: 66 mg/dL (ref >39)
LDL Calculated: 92 mg/dL (ref 0–99)
Triglycerides: 101 mg/dL (ref 0–149)
VLDL Cholesterol Cal: 20 mg/dL (ref 5–40)

## 2018-02-14 LAB — CBC WITH DIFFERENTIAL/PLATELET
BASOS ABS: 0 10*3/uL (ref 0.0–0.2)
Basos: 1 %
EOS (ABSOLUTE): 0.2 10*3/uL (ref 0.0–0.4)
Eos: 3 %
Hematocrit: 30.7 % — ABNORMAL LOW (ref 34.0–46.6)
Hemoglobin: 10.7 g/dL — ABNORMAL LOW (ref 11.1–15.9)
IMMATURE GRANULOCYTES: 0 %
Immature Grans (Abs): 0 10*3/uL (ref 0.0–0.1)
LYMPHS ABS: 1.1 10*3/uL (ref 0.7–3.1)
Lymphs: 22 %
MCH: 31.5 pg (ref 26.6–33.0)
MCHC: 34.9 g/dL (ref 31.5–35.7)
MCV: 90 fL (ref 79–97)
MONOS ABS: 0.4 10*3/uL (ref 0.1–0.9)
Monocytes: 9 %
NEUTROS PCT: 65 %
Neutrophils Absolute: 3.3 10*3/uL (ref 1.4–7.0)
PLATELETS: 89 10*3/uL — AB (ref 150–450)
RBC: 3.4 x10E6/uL — AB (ref 3.77–5.28)
RDW: 12.5 % (ref 12.3–15.4)
WBC: 5 10*3/uL (ref 3.4–10.8)

## 2018-02-14 LAB — BMP8+EGFR
BUN/Creatinine Ratio: 18 (ref 12–28)
BUN: 25 mg/dL (ref 8–27)
CHLORIDE: 100 mmol/L (ref 96–106)
CO2: 26 mmol/L (ref 20–29)
Calcium: 9.6 mg/dL (ref 8.7–10.3)
Creatinine, Ser: 1.42 mg/dL — ABNORMAL HIGH (ref 0.57–1.00)
GFR calc Af Amer: 39 mL/min/{1.73_m2} — ABNORMAL LOW (ref >59)
GFR calc non Af Amer: 34 mL/min/{1.73_m2} — ABNORMAL LOW (ref >59)
GLUCOSE: 98 mg/dL (ref 65–99)
Potassium: 4.1 mmol/L (ref 3.5–5.2)
SODIUM: 142 mmol/L (ref 134–144)

## 2018-02-14 LAB — VITAMIN D 25 HYDROXY (VIT D DEFICIENCY, FRACTURES): Vit D, 25-Hydroxy: 54.5 ng/mL (ref 30.0–100.0)

## 2018-02-14 LAB — HEPATIC FUNCTION PANEL
ALBUMIN: 4.3 g/dL (ref 3.5–4.7)
ALK PHOS: 117 IU/L (ref 39–117)
ALT: 11 IU/L (ref 0–32)
AST: 18 IU/L (ref 0–40)
BILIRUBIN TOTAL: 0.6 mg/dL (ref 0.0–1.2)
BILIRUBIN, DIRECT: 0.22 mg/dL (ref 0.00–0.40)
Total Protein: 6.9 g/dL (ref 6.0–8.5)

## 2018-02-17 ENCOUNTER — Other Ambulatory Visit: Payer: Self-pay | Admitting: *Deleted

## 2018-02-17 DIAGNOSIS — R710 Precipitous drop in hematocrit: Secondary | ICD-10-CM

## 2018-02-18 ENCOUNTER — Other Ambulatory Visit (HOSPITAL_COMMUNITY): Payer: Self-pay | Admitting: Otolaryngology

## 2018-02-18 DIAGNOSIS — H9313 Tinnitus, bilateral: Secondary | ICD-10-CM | POA: Diagnosis not present

## 2018-02-18 DIAGNOSIS — H8113 Benign paroxysmal vertigo, bilateral: Secondary | ICD-10-CM | POA: Diagnosis not present

## 2018-02-18 DIAGNOSIS — J320 Chronic maxillary sinusitis: Secondary | ICD-10-CM | POA: Diagnosis not present

## 2018-02-18 DIAGNOSIS — H903 Sensorineural hearing loss, bilateral: Secondary | ICD-10-CM | POA: Diagnosis not present

## 2018-02-25 ENCOUNTER — Ambulatory Visit (HOSPITAL_COMMUNITY)
Admission: RE | Admit: 2018-02-25 | Discharge: 2018-02-25 | Disposition: A | Discharge status: Home / Self Care | Payer: Medicare Other | Source: Ambulatory Visit | Attending: Otolaryngology | Admitting: Otolaryngology

## 2018-02-25 DIAGNOSIS — R439 Unspecified disturbances of smell and taste: Secondary | ICD-10-CM | POA: Insufficient documentation

## 2018-02-25 DIAGNOSIS — H9319 Tinnitus, unspecified ear: Secondary | ICD-10-CM | POA: Diagnosis not present

## 2018-02-25 DIAGNOSIS — H9313 Tinnitus, bilateral: Secondary | ICD-10-CM

## 2018-02-25 DIAGNOSIS — H8113 Benign paroxysmal vertigo, bilateral: Secondary | ICD-10-CM

## 2018-02-25 DIAGNOSIS — R42 Dizziness and giddiness: Secondary | ICD-10-CM | POA: Diagnosis present

## 2018-02-25 MED ORDER — GADOBUTROL 1 MMOL/ML IV SOLN
9.0000 mL | Freq: Once | INTRAVENOUS | Status: AC | PRN
  Administered 2018-02-25: 9 mL via INTRAVENOUS

## 2018-02-26 ENCOUNTER — Telehealth: Payer: Self-pay | Admitting: Family Medicine

## 2018-02-26 ENCOUNTER — Ambulatory Visit: Payer: Medicare Other | Admitting: Family Medicine

## 2018-02-26 NOTE — Telephone Encounter (Signed)
She was scheduled for a B-12.  Can come another day.

## 2018-03-04 DIAGNOSIS — H903 Sensorineural hearing loss, bilateral: Secondary | ICD-10-CM | POA: Diagnosis not present

## 2018-03-04 DIAGNOSIS — J320 Chronic maxillary sinusitis: Secondary | ICD-10-CM | POA: Diagnosis not present

## 2018-03-04 DIAGNOSIS — J322 Chronic ethmoidal sinusitis: Secondary | ICD-10-CM | POA: Diagnosis not present

## 2018-03-04 DIAGNOSIS — J370 Chronic laryngitis: Secondary | ICD-10-CM | POA: Diagnosis not present

## 2018-03-12 ENCOUNTER — Other Ambulatory Visit: Payer: Self-pay | Admitting: Family Medicine

## 2018-03-12 ENCOUNTER — Ambulatory Visit (INDEPENDENT_AMBULATORY_CARE_PROVIDER_SITE_OTHER): Payer: Medicare Other | Admitting: *Deleted

## 2018-03-12 DIAGNOSIS — E538 Deficiency of other specified B group vitamins: Secondary | ICD-10-CM | POA: Diagnosis not present

## 2018-03-12 DIAGNOSIS — R710 Precipitous drop in hematocrit: Secondary | ICD-10-CM | POA: Diagnosis not present

## 2018-03-13 ENCOUNTER — Other Ambulatory Visit: Payer: Self-pay | Admitting: *Deleted

## 2018-03-13 ENCOUNTER — Other Ambulatory Visit: Payer: Self-pay

## 2018-03-13 DIAGNOSIS — D696 Thrombocytopenia, unspecified: Secondary | ICD-10-CM

## 2018-03-13 LAB — CBC WITH DIFFERENTIAL/PLATELET
Basophils Absolute: 0 10*3/uL (ref 0.0–0.2)
Basos: 1 %
EOS (ABSOLUTE): 0.3 10*3/uL (ref 0.0–0.4)
EOS: 6 %
HEMATOCRIT: 30.6 % — AB (ref 34.0–46.6)
HEMOGLOBIN: 10.3 g/dL — AB (ref 11.1–15.9)
Immature Grans (Abs): 0 10*3/uL (ref 0.0–0.1)
Immature Granulocytes: 0 %
Lymphocytes Absolute: 1.1 10*3/uL (ref 0.7–3.1)
Lymphs: 23 %
MCH: 31.5 pg (ref 26.6–33.0)
MCHC: 33.7 g/dL (ref 31.5–35.7)
MCV: 94 fL (ref 79–97)
MONOCYTES: 11 %
Monocytes Absolute: 0.5 10*3/uL (ref 0.1–0.9)
NEUTROS ABS: 2.9 10*3/uL (ref 1.4–7.0)
Neutrophils: 59 %
PLATELETS: 72 10*3/uL — AB (ref 150–450)
RBC: 3.27 x10E6/uL — ABNORMAL LOW (ref 3.77–5.28)
RDW: 12.6 % (ref 12.3–15.4)
WBC: 4.8 10*3/uL (ref 3.4–10.8)

## 2018-03-13 MED ORDER — KLOR-CON 10 10 MEQ PO TBCR: 10.0000 meq | EXTENDED_RELEASE_TABLET | Freq: Every day | ORAL | 0 refills | Status: DC

## 2018-03-18 DIAGNOSIS — L84 Corns and callosities: Secondary | ICD-10-CM | POA: Diagnosis not present

## 2018-03-18 DIAGNOSIS — B351 Tinea unguium: Secondary | ICD-10-CM | POA: Diagnosis not present

## 2018-03-18 DIAGNOSIS — I70203 Unspecified atherosclerosis of native arteries of extremities, bilateral legs: Secondary | ICD-10-CM | POA: Diagnosis not present

## 2018-03-18 DIAGNOSIS — M79676 Pain in unspecified toe(s): Secondary | ICD-10-CM | POA: Diagnosis not present

## 2018-04-07 ENCOUNTER — Other Ambulatory Visit: Payer: Self-pay

## 2018-04-07 ENCOUNTER — Inpatient Hospital Stay (HOSPITAL_COMMUNITY): Payer: Medicare Other | Attending: Hematology | Admitting: Hematology

## 2018-04-07 ENCOUNTER — Inpatient Hospital Stay (HOSPITAL_COMMUNITY): Payer: Medicare Other

## 2018-04-07 ENCOUNTER — Encounter (HOSPITAL_COMMUNITY): Payer: Self-pay | Admitting: Hematology

## 2018-04-07 VITALS — BP 141/49 | HR 55 | Temp 98.2°F | Resp 16 | Ht 64.0 in | Wt 209.0 lb

## 2018-04-07 DIAGNOSIS — E039 Hypothyroidism, unspecified: Secondary | ICD-10-CM | POA: Diagnosis not present

## 2018-04-07 DIAGNOSIS — I10 Essential (primary) hypertension: Secondary | ICD-10-CM | POA: Diagnosis not present

## 2018-04-07 DIAGNOSIS — D649 Anemia, unspecified: Secondary | ICD-10-CM | POA: Diagnosis not present

## 2018-04-07 DIAGNOSIS — Z803 Family history of malignant neoplasm of breast: Secondary | ICD-10-CM | POA: Diagnosis not present

## 2018-04-07 DIAGNOSIS — Z7901 Long term (current) use of anticoagulants: Secondary | ICD-10-CM | POA: Diagnosis not present

## 2018-04-07 DIAGNOSIS — D696 Thrombocytopenia, unspecified: Secondary | ICD-10-CM | POA: Insufficient documentation

## 2018-04-07 DIAGNOSIS — R945 Abnormal results of liver function studies: Secondary | ICD-10-CM | POA: Diagnosis not present

## 2018-04-07 DIAGNOSIS — Z79899 Other long term (current) drug therapy: Secondary | ICD-10-CM | POA: Diagnosis not present

## 2018-04-07 DIAGNOSIS — I480 Paroxysmal atrial fibrillation: Secondary | ICD-10-CM | POA: Insufficient documentation

## 2018-04-07 DIAGNOSIS — Z1159 Encounter for screening for other viral diseases: Secondary | ICD-10-CM | POA: Diagnosis not present

## 2018-04-07 DIAGNOSIS — Z8041 Family history of malignant neoplasm of ovary: Secondary | ICD-10-CM | POA: Diagnosis not present

## 2018-04-07 DIAGNOSIS — E785 Hyperlipidemia, unspecified: Secondary | ICD-10-CM | POA: Diagnosis not present

## 2018-04-07 LAB — CBC WITH DIFFERENTIAL/PLATELET
Abs Immature Granulocytes: 0.01 K/uL (ref 0.00–0.07)
Basophils Absolute: 0 K/uL (ref 0.0–0.1)
Basophils Relative: 0 %
Eosinophils Absolute: 0.3 K/uL (ref 0.0–0.5)
Eosinophils Relative: 5 %
HCT: 34.2 % — ABNORMAL LOW (ref 36.0–46.0)
Hemoglobin: 10.6 g/dL — ABNORMAL LOW (ref 12.0–15.0)
Immature Granulocytes: 0 %
Lymphocytes Relative: 18 %
Lymphs Abs: 0.8 K/uL (ref 0.7–4.0)
MCH: 30.8 pg (ref 26.0–34.0)
MCHC: 31 g/dL (ref 30.0–36.0)
MCV: 99.4 fL (ref 80.0–100.0)
Monocytes Absolute: 0.5 K/uL (ref 0.1–1.0)
Monocytes Relative: 10 %
Neutro Abs: 3.2 K/uL (ref 1.7–7.7)
Neutrophils Relative %: 67 %
Platelets: 78 K/uL — ABNORMAL LOW (ref 150–400)
RBC: 3.44 MIL/uL — ABNORMAL LOW (ref 3.87–5.11)
RDW: 13.7 % (ref 11.5–15.5)
WBC: 4.7 K/uL (ref 4.0–10.5)
nRBC: 0 % (ref 0.0–0.2)

## 2018-04-07 LAB — COMPREHENSIVE METABOLIC PANEL WITH GFR
ALT: 12 U/L (ref 0–44)
AST: 23 U/L (ref 15–41)
Albumin: 4 g/dL (ref 3.5–5.0)
Alkaline Phosphatase: 109 U/L (ref 38–126)
Anion gap: 8 (ref 5–15)
BUN: 25 mg/dL — ABNORMAL HIGH (ref 8–23)
CO2: 28 mmol/L (ref 22–32)
Calcium: 9.1 mg/dL (ref 8.9–10.3)
Chloride: 105 mmol/L (ref 98–111)
Creatinine, Ser: 1.43 mg/dL — ABNORMAL HIGH (ref 0.44–1.00)
GFR calc Af Amer: 39 mL/min — ABNORMAL LOW (ref >60)
GFR calc non Af Amer: 34 mL/min — ABNORMAL LOW (ref >60)
Glucose, Bld: 123 mg/dL — ABNORMAL HIGH (ref 70–99)
Potassium: 3.9 mmol/L (ref 3.5–5.1)
Sodium: 141 mmol/L (ref 135–145)
Total Bilirubin: 0.7 mg/dL (ref 0.3–1.2)
Total Protein: 6.9 g/dL (ref 6.5–8.1)

## 2018-04-07 LAB — IRON AND TIBC
Iron: 48 ug/dL (ref 28–170)
SATURATION RATIOS: 14 % (ref 10.4–31.8)
TIBC: 353 ug/dL (ref 250–450)
UIBC: 305 ug/dL

## 2018-04-07 LAB — VITAMIN B12: Vitamin B-12: 643 pg/mL (ref 180–914)

## 2018-04-07 LAB — FOLATE: Folate: 9.8 ng/mL (ref >5.9)

## 2018-04-07 LAB — FERRITIN: Ferritin: 129 ng/mL (ref 11–307)

## 2018-04-07 LAB — SEDIMENTATION RATE: Sed Rate: 35 mm/h — ABNORMAL HIGH (ref 0–22)

## 2018-04-07 LAB — LACTATE DEHYDROGENASE: LDH: 183 U/L (ref 98–192)

## 2018-04-07 NOTE — Patient Instructions (Signed)
[facility] at [facility] Discharge Instructions  You were seen today by Dr. Ellin Saba. He discussed your low platelet count and any symptoms you maybe having. Dr. Kirtland Bouchard discussed your family history and illnesses. We will see you back in 2-3 weeks for follow up and to go over your lab results.    Thank you for choosing [facility] at [facility] to provide your oncology and hematology care.  To afford each patient quality time with our provider, please arrive at least 15 minutes before your scheduled appointment time.   If you have a lab appointment with the Cancer Center please come in thru the  Main Entrance and check in at the main information desk  You need to re-schedule your appointment should you arrive 10 or more minutes late.  We strive to give you quality time with our providers, and arriving late affects you and other patients whose appointments are after yours.  Also, if you no show three or more times for appointments you may be dismissed from the clinic at the providers discretion.     Again, thank you for choosing [facility].  Our hope is that these requests will decrease the amount of time that you wait before being seen by our physicians.       _____________________________________________________________  Should you have questions after your visit to [facility], please contact our office at [phone] between the hours of 8:00 a.m. and 4:30 p.m.  Voicemails left after 4:00 p.m. will not be returned until the following business day.  For prescription refill requests, have your pharmacy contact our office and allow 72 hours.    Cancer Center Support Programs:   > Cancer Support Group  2nd Tuesday of the month 1pm-2pm, Journey Room

## 2018-04-07 NOTE — Assessment & Plan Note (Signed)
1.  Moderate thrombocytopenia: - Patient had low platelet count since June 2018 between 71-104. -She denied any major bleeding episodes but has easy bruising. -She is on Eliquis for paroxysmal atrial fibrillation. -She denies any fevers, night sweats or weight loss in the last 6 months.  She does have occasional hot flashes. - She lives by herself in [place].  Family history significant for daughter with breast cancer, maternal aunt with ovarian cancer and her brother sister died of metastatic cancer. - An ultrasound of the abdomen in January 2019 as well as a CT scan in January 2018 showed normal spleen size. -Differential diagnosis includes immune thrombocytopenia versus myelodysplastic syndrome. -Today we will do blood work to rule out nutritional deficiencies.  We will check B12, folic acid, copper levels.  We will repeat her CBC and review her smear.  We will check an LDH level.  We will also check SPEP to rule out plasma cell disorders.  We will check ANA, rheumatoid factor to evaluate for connective tissue disorders. - She will be seen back in 2 to 3 weeks for follow-up.

## 2018-04-07 NOTE — Progress Notes (Signed)
CONSULT NOTE  Patient Care Team: Chipper Herb, MD as PCP - General (Family Medicine) Minus Breeding, MD as Consulting Physician (Cardiology) Clyde Canterbury, MD (Pain Medicine)  CHIEF COMPLAINTS/PURPOSE OF CONSULTATION:  Moderate thrombocytopenia.  HISTORY OF PRESENTING ILLNESS:  [name] 85 y.o. female is seen in consultation today for further work-up and management of moderate thrombocytopenia.  She was found to have low platelet count of 71 on 09/13/2016.  Since then her platelet count ranged between 70-104.  She denies any bleeding but has easy bruising.  She is also on Eliquis for her paroxysmal atrial fibrillation.  Denies any fevers, night sweats or weight loss.  She lives at home by herself and is independent of ADLs and IADLs.  She denies any personal history of connective tissue disorders.  No prior transfusion history.  She has family history significant for daughter with breast cancer.  Maternal aunt had ovarian cancer.  Brother sister also had metastatic cancer.  Denies any tingling or numbness in the extremities.  Recent MRI of the head on 02/25/2018 did not show any intracranial abnormalities.  I have reviewed her prior CT scan and ultrasound of the abdomen which showed normal-sized spleen.  MEDICAL HISTORY:  Past Medical History:  Diagnosis Date  . AF (atrial fibrillation) ([place])   . Arthritis   . Diaphragmatic hernia without mention of obstruction or gangrene   . Esophagitis   . First degree atrioventricular block   . Gastric polyp   . GERD (gastroesophageal reflux disease)   . Heme positive stool   . Hyperlipidemia   . Hypertension   . Hypothyroid   . Prolapse of vaginal walls without mention of uterine prolapse   . Rectal polyp     SURGICAL HISTORY: Past Surgical History:  Procedure Laterality Date  . DILATION AND CURETTAGE OF UTERUS    . KIDNEY STONE SURGERY      SOCIAL HISTORY: Social History   Socioeconomic History  . Marital status:  Divorced    Spouse name: Not on file  . Number of children: 2  . Years of education: 10th grade  . Highest education level: 10th grade  Occupational History  . Occupation: retired    Comment: [employer]  Social Needs  . Financial resource strain: Not hard at all  . Food insecurity:    Worry: Never true    Inability: Never true  . Transportation needs:    Medical: No    Non-medical: No  Tobacco Use  . Smoking status: Never Smoker  . Smokeless tobacco: Never Used  Substance and Sexual Activity  . Alcohol use: No  . Drug use: No  . Sexual activity: Not Currently  Lifestyle  . Physical activity:    Days per week: 7 days    Minutes per session: 30 min  . Stress: Not at all  Relationships  . Social connections:    Talks on phone: More than three times a week    Gets together: Three times a week    Attends religious service: More than 4 times per year    Active member of club or organization: No    Attends meetings of clubs or organizations: Never    Relationship status: Divorced  . Intimate partner violence:    Fear of current or ex partner: No    Emotionally abused: No    Physically abused: No    Forced sexual activity: No  Other Topics Concern  . Not on file  Social History Narrative  .  Not on file    FAMILY HISTORY: Family History  Problem Relation Age of Onset  . Hypertension Mother   . Aneurysm Mother        Brain  . Hypertension Father   . Kidney disease Father   . Aneurysm Father        heart  . Arthritis Daughter   . Cancer Daughter        breast  . Hyperlipidemia Sister     ALLERGIES:  is allergic to aspirin; codeine; sulfa antibiotics; vicodin [hydrocodone-acetaminophen]; warfarin sodium; and cephalexin.  MEDICATIONS:  Current Outpatient Medications  Medication Sig Dispense Refill  . albuterol (PROVENTIL HFA;VENTOLIN HFA) 108 (90 Base) MCG/ACT inhaler Inhale 2 puffs into the lungs every 6 (six) hours as needed for wheezing or shortness of breath. 8  g 0  . calcium-vitamin D (OSCAL WITH D) 500-200 MG-UNIT per tablet Take 1 tablet by mouth daily.    . Cholecalciferol (VITAMIN D3) 2000 UNITS TABS Take 1 tablet by mouth daily.      Marland Kitchen ELIQUIS 5 MG TABS tablet TAKE 1 TABLET BY MOUTH TWICE DAILY 180 tablet 0  . furosemide (LASIX) 40 MG tablet TAKE 1 TABLET BY MOUTH ONCE DAILY 90 tablet 1  . gabapentin (NEURONTIN) 100 MG capsule TAKE 1 CAPSULE BY MOUTH TWICE DAILY 60 capsule 5  . KLOR-CON 10 10 MEQ tablet Take 1 tablet (10 mEq total) by mouth daily. 90 tablet 0  . levothyroxine (SYNTHROID, LEVOTHROID) 137 MCG tablet TAKE 1 TABLET BY MOUTH ONCE DAILY AS  DIRECTED 90 tablet 2  . lisinopril (PRINIVIL,ZESTRIL) 40 MG tablet Take 1 tablet (40 mg total) by mouth daily. 90 tablet 3  . LORazepam (ATIVAN) 0.5 MG tablet One half tablet to one daily if needed for anxiety 30 tablet 3  . Multiple Vitamins-Minerals (CENTRUM SILVER) tablet Take 1 tablet by mouth daily.     Marland Kitchen omeprazole (PRILOSEC) 20 MG capsule TAKE 1 CAPSULE BY MOUTH TWICE DAILY BEFORE A MEAL 180 capsule 0  . polyethylene glycol powder (GLYCOLAX/MIRALAX) powder DISSOLVE 17 GRAMS (ONE CAPFUL) OF POWDER IN WATER & DRINK ONCE DAILY AS NEEDED 850 g 1  . ferrous fumarate-iron polysaccharide complex (TANDEM) 162-115.2 MG CAPS capsule Take 1 capsule by mouth daily with breakfast. (Patient not taking: Reported on 04/07/2018) 30 capsule 2   Current Facility-Administered Medications  Medication Dose Route Frequency Provider Last Rate Last Dose  . cyanocobalamin ((VITAMIN B-12)) injection 1,000 mcg  1,000 mcg Intramuscular Q30 days Chipper Herb, MD   1,000 mcg at 03/12/18 3329    REVIEW OF SYSTEMS:   Constitutional: Denies fevers, chills or abnormal night sweats Eyes: Denies blurriness of vision, double vision or watery eyes Ears, nose, mouth, throat, and face: Denies mucositis or sore throat Respiratory: Denies cough, dyspnea or wheezes Cardiovascular: Denies palpitation, chest discomfort or lower  extremity swelling Gastrointestinal:  Denies nausea, heartburn or change in bowel habits Skin: Denies abnormal skin rashes Lymphatics: Denies new lymphadenopathy or easy bruising Neurological:Denies numbness, tingling or new weaknesses Behavioral/Psych: Mood is stable, no new changes  All other systems were reviewed with the patient and are negative.  PHYSICAL EXAMINATION: ECOG PERFORMANCE STATUS: 1 - Symptomatic but completely ambulatory  Vitals:   04/07/18 1332  BP: (!) 141/49  Pulse: (!) 55  Resp: 16  Temp: 98.2 F (36.8 C)  SpO2: 99%   Filed Weights   04/07/18 1332  Weight: 209 lb (94.8 kg)    GENERAL:alert, no distress and comfortable SKIN: skin color, texture,  turgor are normal, no rashes or significant lesions EYES: normal, conjunctiva are pink and non-injected, sclera clear OROPHARYNX:no exudate, no erythema and lips, buccal mucosa, and tongue normal  NECK: supple, thyroid normal size, non-tender, without nodularity LYMPH:  no palpable lymphadenopathy in the cervical, axillary or inguinal LUNGS: clear to auscultation and percussion with normal breathing effort HEART: regular rate & rhythm and no murmurs.  Positive lower extremity edema. ABDOMEN:abdomen soft, non-tender and normal bowel sounds Musculoskeletal:no cyanosis of digits and no clubbing  PSYCH: alert & oriented x 3 with fluent speech NEURO: no focal motor/sensory deficits  LABORATORY DATA:  I have reviewed the data as listed Recent Results (from the past 2160 hour(s))  BMP8+EGFR     Status: Abnormal   Collection Time: 02/13/18 12:33 PM  Result Value Ref Range   Glucose 98 65 - 99 mg/dL   BUN 25 8 - 27 mg/dL   Creatinine, Ser 1.42 (H) 0.57 - 1.00 mg/dL   GFR calc non Af Amer 34 (L) >59 mL/min/1.73   GFR calc Af Amer 39 (L) >59 mL/min/1.73   BUN/Creatinine Ratio 18 12 - 28   Sodium 142 134 - 144 mmol/L   Potassium 4.1 3.5 - 5.2 mmol/L   Chloride 100 96 - 106 mmol/L   CO2 26 20 - 29 mmol/L   Calcium  9.6 8.7 - 10.3 mg/dL  CBC with Differential/Platelet     Status: Abnormal   Collection Time: 02/13/18 12:33 PM  Result Value Ref Range   WBC 5.0 3.4 - 10.8 x10E3/uL   RBC 3.40 (L) 3.77 - 5.28 x10E6/uL   Hemoglobin 10.7 (L) 11.1 - 15.9 g/dL   Hematocrit 30.7 (L) 34.0 - 46.6 %   MCV 90 79 - 97 fL   MCH 31.5 26.6 - 33.0 pg   MCHC 34.9 31.5 - 35.7 g/dL   RDW 12.5 12.3 - 15.4 %   Platelets 89 (LL) 150 - 450 x10E3/uL    Comment: Actual platelet count may be somewhat higher than reported due to aggregation of platelets in this sample.    Neutrophils 65 Not Estab. %    Comment: Hypogranular neutrophils present   Lymphs 22 Not Estab. %   Monocytes 9 Not Estab. %   Eos 3 Not Estab. %   Basos 1 Not Estab. %   Neutrophils Absolute 3.3 1.4 - 7.0 x10E3/uL   Lymphocytes Absolute 1.1 0.7 - 3.1 x10E3/uL   Monocytes Absolute 0.4 0.1 - 0.9 x10E3/uL   EOS (ABSOLUTE) 0.2 0.0 - 0.4 x10E3/uL   Basophils Absolute 0.0 0.0 - 0.2 x10E3/uL   Immature Granulocytes 0 Not Estab. %   Immature Grans (Abs) 0.0 0.0 - 0.1 x10E3/uL   Hematology Comments: Note:     Comment: Verified by microscopic examination.  Lipid panel     Status: None   Collection Time: 02/13/18 12:33 PM  Result Value Ref Range   Cholesterol, Total 178 100 - 199 mg/dL   Triglycerides 101 0 - 149 mg/dL   HDL 66 >39 mg/dL   VLDL Cholesterol Cal 20 5 - 40 mg/dL   LDL Calculated 92 0 - 99 mg/dL   Chol/HDL Ratio 2.7 0.0 - 4.4 ratio    Comment:                                   T. Chol/HDL Ratio  Men  Women                               1/2 Avg.Risk  3.4    3.3                                   Avg.Risk  5.0    4.4                                2X Avg.Risk  9.6    7.1                                3X Avg.Risk 23.4   11.0   VITAMIN D 25 Hydroxy (Vit-D Deficiency, Fractures)     Status: None   Collection Time: 02/13/18 12:33 PM  Result Value Ref Range   Vit D, 25-Hydroxy 54.5 30.0 - 100.0 ng/mL     Comment: Vitamin D deficiency has been defined by the [place] practice guideline as a level of serum 25-OH vitamin D less than 20 ng/mL (1,2). The Endocrine Society went on to further define vitamin D insufficiency as a level between 21 and 29 ng/mL (2). 1. IOM (Institute of Medicine). 2010. Dietary reference    intakes for calcium and D. Iron Post: The    [employer]. 2. Holick MF, Binkley Washta, Bischoff-Ferrari HA, et al.    Evaluation, treatment, and prevention of vitamin D    deficiency: an Endocrine Society clinical practice    guideline. JCEM. 2011 Jul; 96(7):1911-30.   Hepatic function panel     Status: None   Collection Time: 02/13/18 12:33 PM  Result Value Ref Range   Total Protein 6.9 6.0 - 8.5 g/dL   Albumin 4.3 3.5 - 4.7 g/dL   Bilirubin Total 0.6 0.0 - 1.2 mg/dL   Bilirubin, Direct 0.22 0.00 - 0.40 mg/dL   Alkaline Phosphatase 117 39 - 117 IU/L   AST 18 0 - 40 IU/L   ALT 11 0 - 32 IU/L  CBC with Differential/Platelet     Status: Abnormal   Collection Time: 03/12/18  8:36 AM  Result Value Ref Range   WBC 4.8 3.4 - 10.8 x10E3/uL   RBC 3.27 (L) 3.77 - 5.28 x10E6/uL   Hemoglobin 10.3 (L) 11.1 - 15.9 g/dL   Hematocrit 30.6 (L) 34.0 - 46.6 %   MCV 94 79 - 97 fL   MCH 31.5 26.6 - 33.0 pg   MCHC 33.7 31.5 - 35.7 g/dL   RDW 12.6 12.3 - 15.4 %    Comment: **Effective April 07, 2018, the RDW pediatric reference**   interval will be removed and the adult reference interval   will be changing to:                             Female 11.7 - 15.4                                                      Female 11.6 -  15.4    Platelets 72 (LL) 150 - 450 x10E3/uL    Comment: Actual platelet count may be somewhat higher than reported due to aggregation of platelets in this sample.    Neutrophils 59 Not Estab. %   Lymphs 23 Not Estab. %   Monocytes 11 Not Estab. %   Eos 6 Not Estab. %   Basos 1 Not Estab. %   Neutrophils  Absolute 2.9 1.4 - 7.0 x10E3/uL   Lymphocytes Absolute 1.1 0.7 - 3.1 x10E3/uL   Monocytes Absolute 0.5 0.1 - 0.9 x10E3/uL   EOS (ABSOLUTE) 0.3 0.0 - 0.4 x10E3/uL   Basophils Absolute 0.0 0.0 - 0.2 x10E3/uL   Immature Granulocytes 0 Not Estab. %   Immature Grans (Abs) 0.0 0.0 - 0.1 x10E3/uL   Hematology Comments: Note:     Comment: Verified by microscopic examination.    RADIOGRAPHIC STUDIES: I have personally reviewed her CT scan of the abdomen and pelvis from 04/29/2016 and ultrasound of the abdomen from January 2019.  ASSESSMENT & PLAN:  Thrombocytopenia ([place]) 1.  Moderate thrombocytopenia: - Patient had low platelet count since June 2018 between 71-104. -She denied any major bleeding episodes but has easy bruising. -She is on Eliquis for paroxysmal atrial fibrillation. -She denies any fevers, night sweats or weight loss in the last 6 months.  She does have occasional hot flashes. - She lives by herself in [place].  Family history significant for daughter with breast cancer, maternal aunt with ovarian cancer and her brother sister died of metastatic cancer. - An ultrasound of the abdomen in January 2019 as well as a CT scan in January 2018 showed normal spleen size. -Differential diagnosis includes immune thrombocytopenia versus myelodysplastic syndrome. -Today we will do blood work to rule out nutritional deficiencies.  We will check V95, folic acid, copper levels.  We will repeat her CBC and review her smear.  We will check an LDH level.  We will also check SPEP to rule out plasma cell disorders.  We will check ANA, rheumatoid factor to evaluate for connective tissue disorders. - She will be seen back in 2 to 3 weeks for follow-up.     All questions were answered. The patient knows to call the clinic with any problems, questions or concerns.     Derek Jack, MD 04/07/18 2:14 PM

## 2018-04-07 NOTE — Progress Notes (Signed)
HPI The patient presents for evaluation of syncope. This was probably related to orthostasis. She has atrial fibrillation but had refused anticoagulation.  She eventually agreed to start Eliquis.  She stopped this after she had headaches and nose bleeding.  However, she subsequently restarted this.  She is being evaluated for thrombocytopenia.  She has chronic anemia as well.  She is had no syncope or presyncope.  She does not notice that she is in fibrillation.  She gets around slowly and still lives independently.  She denies any new symptoms such as shortness of breath, PND orthopnea.  She has some chronic lower extremity edema but no significant change in this.    Allergies  Allergen Reactions  . Aspirin Nausea Only  . Codeine Nausea Only  . Sulfa Antibiotics   . Vicodin [Hydrocodone-Acetaminophen] Nausea Only  . Warfarin Sodium Other (See Comments)    Tingling all over  . Cephalexin Rash    Red, rash covering lower limbs.    Current Outpatient Medications  Medication Sig Dispense Refill  . albuterol (PROVENTIL HFA;VENTOLIN HFA) 108 (90 Base) MCG/ACT inhaler Inhale 2 puffs into the lungs every 6 (six) hours as needed for wheezing or shortness of breath. 8 g 0  . calcium-vitamin D (OSCAL WITH D) 500-200 MG-UNIT per tablet Take 1 tablet by mouth daily.    . Cholecalciferol (VITAMIN D3) 2000 UNITS TABS Take 1 tablet by mouth daily.      Marland Kitchen. ELIQUIS 5 MG TABS tablet TAKE 1 TABLET BY MOUTH TWICE DAILY 180 tablet 0  . ferrous fumarate-iron polysaccharide complex (TANDEM) 162-115.2 MG CAPS capsule Take 1 capsule by mouth daily with breakfast. 30 capsule 2  . furosemide (LASIX) 40 MG tablet TAKE 1 TABLET BY MOUTH ONCE DAILY 90 tablet 1  . gabapentin (NEURONTIN) 100 MG capsule TAKE 1 CAPSULE BY MOUTH TWICE DAILY 60 capsule 5  . KLOR-CON 10 10 MEQ tablet Take 1 tablet (10 mEq total) by mouth daily. 90 tablet 0  . levothyroxine (SYNTHROID, LEVOTHROID) 137 MCG tablet TAKE 1 TABLET BY MOUTH  ONCE DAILY AS  DIRECTED 90 tablet 2  . lisinopril (PRINIVIL,ZESTRIL) 40 MG tablet Take 1 tablet (40 mg total) by mouth daily. 90 tablet 3  . LORazepam (ATIVAN) 0.5 MG tablet One half tablet to one daily if needed for anxiety 30 tablet 3  . Multiple Vitamins-Minerals (CENTRUM SILVER) tablet Take 1 tablet by mouth daily.     Marland Kitchen. omeprazole (PRILOSEC) 20 MG capsule TAKE 1 CAPSULE BY MOUTH TWICE DAILY BEFORE A MEAL 180 capsule 0  . polyethylene glycol powder (GLYCOLAX/MIRALAX) powder DISSOLVE 17 GRAMS (ONE CAPFUL) OF POWDER IN WATER & DRINK ONCE DAILY AS NEEDED 850 g 1   Current Facility-Administered Medications  Medication Dose Route Frequency Provider Last Rate Last Dose  . cyanocobalamin ((VITAMIN B-12)) injection 1,000 mcg  1,000 mcg Intramuscular Q30 days Ernestina PennaMoore, Donald W, MD   1,000 mcg at 03/12/18 [id number]    Past Medical History:  Diagnosis Date  . AF (atrial fibrillation) ([facility])   . Arthritis   . Diaphragmatic hernia without mention of obstruction or gangrene   . Esophagitis   . First degree atrioventricular block   . Gastric polyp   . GERD (gastroesophageal reflux disease)   . Heme positive stool   . Hyperlipidemia   . Hypertension   . Hypothyroid   . Prolapse of vaginal walls without mention of uterine prolapse   . Rectal polyp     Past Surgical History:  Procedure  Laterality Date  . DILATION AND CURETTAGE OF UTERUS    . KIDNEY STONE SURGERY      ROS:  HOH, burping and intolerance to cold otherwise as stated in the HPI and negative for all other systems.   PHYSICAL EXAM BP (!) 144/68   Pulse (!) 52   Ht 5\' 4"  (1.626 m)   Wt 208 lb (94.3 kg)   BMI 35.70 kg/m   GENERAL:  Well appearing NECK:  No jugular venous distention, waveform within normal limits, carotid upstroke brisk and symmetric, no bruits, no thyromegaly LUNGS:  Clear to auscultation bilaterally CHEST:  Unremarkable HEART:  PMI not displaced or sustained,S1 and S2 within normal limits, no S3, no clicks, no  rubs, 2 out of 6 holosystolic apical murmur, no diastolic murmurs, irregular ABD:  Flat, positive bowel sounds normal in frequency in pitch, no bruits, no rebound, no guarding, no midline pulsatile mass, no hepatomegaly, no splenomegaly EXT:  2 plus pulses throughout, no edema, no cyanosis no clubbing   EKG: Atrial fibrillation, rate 52, left bundle branch block, left axis deviation.  ASSESSMENT AND PLAN   ATRIAL FIBRILLATION:  She has a CHA2DS2 - VASc score of 4 with a risk of stroke of 4%.   She is currently tolerating anticoagulation.  No change in therapy.   SYNCOPE: She is had no further episodes.  There was no evidence however that this was rate related but I did tell her that if in the future she ever has presyncope or syncope I would need to know about this.  There is no indication for pacing at this point or for symptomatic bradycardia arrhythmia.  MITRAL REGURGITATION:   She had mild MR in 03/2015.  I would like to check an echocardiogram as it is been 3 years.  HTN:  The blood pressure is at target.  No change in therapy.   THROMBOCYTOPENIA:  I did review the Heme/Onc note and work up is ongoing.  However, there does not seem to be a contraindication anticoagulation she will continue.  FATIGUE:  I suspect that this is multifactorial.  No further evaluation from a cardiovascular standpoint..Marland Kitchen

## 2018-04-08 LAB — CARDIOLIPIN ANTIBODIES, IGG, IGM, IGA
Anticardiolipin IgA: <9 APL U/mL (ref 0–11)
Anticardiolipin IgG: <9 GPL U/mL (ref 0–14)
Anticardiolipin IgM: 15 MPL U/mL — ABNORMAL HIGH (ref 0–12)

## 2018-04-08 LAB — PROTEIN ELECTROPHORESIS, SERUM
A/G Ratio: 1.4 (ref 0.7–1.7)
Albumin ELP: 4 g/dL (ref 2.9–4.4)
Alpha-1-Globulin: 0.2 g/dL (ref 0.0–0.4)
Alpha-2-Globulin: 0.7 g/dL (ref 0.4–1.0)
Beta Globulin: 1 g/dL (ref 0.7–1.3)
Gamma Globulin: 0.8 g/dL (ref 0.4–1.8)
Globulin, Total: 2.8 g/dL (ref 2.2–3.9)
Total Protein ELP: 6.8 g/dL (ref 6.0–8.5)

## 2018-04-08 LAB — HEPATITIS PANEL, ACUTE
HCV Ab: <0.1 s/co ratio (ref 0.0–0.9)
HEP B S AG: NEGATIVE
Hep A IgM: POSITIVE — AB
Hep B C IgM: NEGATIVE

## 2018-04-08 LAB — BETA-2-GLYCOPROTEIN I ABS, IGG/M/A
Beta-2 Glyco I IgG: <9 GPI IgG units (ref 0–20)
Beta-2-Glycoprotein I IgA: 9 GPI IgA units (ref 0–25)
Beta-2-Glycoprotein I IgM: 15 GPI IgM units (ref 0–32)

## 2018-04-08 LAB — ANTINUCLEAR ANTIBODIES, IFA: ANA Ab, IFA: NEGATIVE

## 2018-04-08 LAB — PATHOLOGIST SMEAR REVIEW

## 2018-04-08 LAB — RHEUMATOID FACTOR: Rheumatoid fact SerPl-aCnc: <10 IU/mL (ref 0.0–13.9)

## 2018-04-09 ENCOUNTER — Ambulatory Visit (INDEPENDENT_AMBULATORY_CARE_PROVIDER_SITE_OTHER): Payer: Medicare Other | Admitting: Cardiology

## 2018-04-09 ENCOUNTER — Encounter: Payer: Self-pay | Admitting: Cardiology

## 2018-04-09 VITALS — BP 144/68 | HR 52 | Ht 64.0 in | Wt 208.0 lb

## 2018-04-09 DIAGNOSIS — I10 Essential (primary) hypertension: Secondary | ICD-10-CM

## 2018-04-09 DIAGNOSIS — I340 Nonrheumatic mitral (valve) insufficiency: Secondary | ICD-10-CM

## 2018-04-09 DIAGNOSIS — I4821 Permanent atrial fibrillation: Secondary | ICD-10-CM | POA: Diagnosis not present

## 2018-04-09 LAB — LUPUS ANTICOAGULANT PANEL
DRVVT: 74.7 s — ABNORMAL HIGH (ref 0.0–47.0)
PTT LA: 38.1 s (ref 0.0–51.9)

## 2018-04-09 LAB — DRVVT CONFIRM: dRVVT Confirm: 1.5 ratio — ABNORMAL HIGH (ref 0.8–1.2)

## 2018-04-09 LAB — DRVVT MIX: dRVVT Mix: 50.4 s — ABNORMAL HIGH (ref 0.0–47.0)

## 2018-04-09 LAB — COPPER, SERUM: Copper: 133 ug/dL (ref 72–166)

## 2018-04-09 NOTE — Patient Instructions (Addendum)
Medication Instructions:  The current medical regimen is effective;  continue present plan and medications.  If you need a refill on your cardiac medications before your next appointment, please call your pharmacy.   Your physician has requested that you have an echocardiogram. Echocardiography is a painless test that uses sound waves to create images of your heart. It provides your doctor with information about the size and shape of your heart and how well your heart's chambers and valves are working. This procedure takes approximately one hour. There are no restrictions for this procedure.  Follow-Up: Follow up in 1 year with Dr. Antoine Poche.  You will receive a letter in the mail 2 months before you are due.  Please call us when you receive this letter to schedule your follow up appointment.  Thank you for choosing [facility]!!

## 2018-04-11 ENCOUNTER — Other Ambulatory Visit: Payer: Self-pay | Admitting: Family Medicine

## 2018-04-11 DIAGNOSIS — F4322 Adjustment disorder with anxiety: Secondary | ICD-10-CM

## 2018-04-11 DIAGNOSIS — K2100 Gastro-esophageal reflux disease with esophagitis, without bleeding: Secondary | ICD-10-CM

## 2018-04-11 DIAGNOSIS — J40 Bronchitis, not specified as acute or chronic: Secondary | ICD-10-CM

## 2018-04-11 DIAGNOSIS — I10 Essential (primary) hypertension: Secondary | ICD-10-CM

## 2018-04-11 MED ORDER — KLOR-CON 10 10 MEQ PO TBCR: 10.0000 meq | EXTENDED_RELEASE_TABLET | Freq: Every day | ORAL | 0 refills | Status: DC

## 2018-04-11 MED ORDER — LEVOTHYROXINE SODIUM 137 MCG PO TABS: 137.0000 ug | ORAL_TABLET | Freq: Every day | ORAL | 1 refills | Status: DC

## 2018-04-11 MED ORDER — FUROSEMIDE 40 MG PO TABS: 40.0000 mg | ORAL_TABLET | Freq: Every day | ORAL | 0 refills | Status: DC

## 2018-04-11 MED ORDER — LISINOPRIL 40 MG PO TABS: 40.0000 mg | ORAL_TABLET | Freq: Every day | ORAL | 0 refills | Status: DC

## 2018-04-11 MED ORDER — ALBUTEROL SULFATE HFA 108 (90 BASE) MCG/ACT IN AERS: 2.0000 | INHALATION_SPRAY | Freq: Four times a day (QID) | RESPIRATORY_TRACT | 1 refills | Status: AC | PRN

## 2018-04-11 MED ORDER — LORAZEPAM 0.5 MG PO TABS: ORAL_TABLET | ORAL | 5 refills | Status: DC

## 2018-04-11 MED ORDER — GABAPENTIN 100 MG PO CAPS: 100.0000 mg | ORAL_CAPSULE | Freq: Two times a day (BID) | ORAL | 0 refills | Status: DC

## 2018-04-11 MED ORDER — OMEPRAZOLE 20 MG PO CPDR: DELAYED_RELEASE_CAPSULE | ORAL | 0 refills | Status: DC

## 2018-04-11 NOTE — Telephone Encounter (Signed)
What is the name of the medication? [name] 10 10 MEQ Patient has been without for four weeks. Switch all medications to [facility]  Have you contacted your pharmacy to request a refill? NO  Which pharmacy would you like this sent to? [facility]   Patient notified that their request is being sent to the clinical staff for review and that they should receive a call once it is complete. If they do not receive a call within 24 hours they can check with their pharmacy or our office.

## 2018-04-11 NOTE — Telephone Encounter (Signed)
Pt aware refills sent on omeprazole, lisinopril, thyroid med, potassium, gabapentin, lasix and inhaler to [facility] pharmacy Please advise on Lorazepam, pt is changing pharmacy's

## 2018-04-14 ENCOUNTER — Ambulatory Visit (INDEPENDENT_AMBULATORY_CARE_PROVIDER_SITE_OTHER): Payer: Medicare Other | Admitting: *Deleted

## 2018-04-14 DIAGNOSIS — E538 Deficiency of other specified B group vitamins: Secondary | ICD-10-CM | POA: Diagnosis not present

## 2018-04-14 NOTE — Progress Notes (Signed)
Pt tolerated well

## 2018-04-15 ENCOUNTER — Ambulatory Visit: Payer: Medicare Other | Admitting: Family Medicine

## 2018-04-15 ENCOUNTER — Other Ambulatory Visit (HOSPITAL_COMMUNITY): Payer: Self-pay

## 2018-04-30 ENCOUNTER — Encounter (HOSPITAL_COMMUNITY): Payer: Self-pay | Admitting: Hematology

## 2018-04-30 ENCOUNTER — Inpatient Hospital Stay (HOSPITAL_COMMUNITY): Admit: 2018-04-30 | Payer: Self-pay

## 2018-04-30 ENCOUNTER — Other Ambulatory Visit: Payer: Self-pay | Admitting: Family Medicine

## 2018-04-30 ENCOUNTER — Inpatient Hospital Stay (HOSPITAL_BASED_OUTPATIENT_CLINIC_OR_DEPARTMENT_OTHER): Payer: Medicare Other | Admitting: Hematology

## 2018-04-30 ENCOUNTER — Other Ambulatory Visit: Payer: Self-pay

## 2018-04-30 ENCOUNTER — Other Ambulatory Visit: Payer: Self-pay | Admitting: Cardiology

## 2018-04-30 VITALS — BP 168/66 | HR 92 | Temp 98.1°F | Resp 18 | Wt 208.5 lb

## 2018-04-30 DIAGNOSIS — Z803 Family history of malignant neoplasm of breast: Secondary | ICD-10-CM

## 2018-04-30 DIAGNOSIS — D649 Anemia, unspecified: Secondary | ICD-10-CM

## 2018-04-30 DIAGNOSIS — D696 Thrombocytopenia, unspecified: Secondary | ICD-10-CM

## 2018-04-30 DIAGNOSIS — I480 Paroxysmal atrial fibrillation: Secondary | ICD-10-CM | POA: Diagnosis not present

## 2018-04-30 DIAGNOSIS — I10 Essential (primary) hypertension: Secondary | ICD-10-CM

## 2018-04-30 DIAGNOSIS — R945 Abnormal results of liver function studies: Secondary | ICD-10-CM | POA: Diagnosis not present

## 2018-04-30 DIAGNOSIS — Z7901 Long term (current) use of anticoagulants: Secondary | ICD-10-CM | POA: Diagnosis not present

## 2018-04-30 DIAGNOSIS — E785 Hyperlipidemia, unspecified: Secondary | ICD-10-CM | POA: Diagnosis not present

## 2018-04-30 DIAGNOSIS — Z79899 Other long term (current) drug therapy: Secondary | ICD-10-CM | POA: Diagnosis not present

## 2018-04-30 DIAGNOSIS — E039 Hypothyroidism, unspecified: Secondary | ICD-10-CM | POA: Diagnosis not present

## 2018-04-30 DIAGNOSIS — Z1159 Encounter for screening for other viral diseases: Secondary | ICD-10-CM | POA: Diagnosis not present

## 2018-04-30 MED ORDER — APIXABAN 5 MG PO TABS: 5.0000 mg | ORAL_TABLET | Freq: Two times a day (BID) | ORAL | 1 refills | Status: DC

## 2018-04-30 NOTE — Telephone Encounter (Signed)
 *  STAT* If patient is at the pharmacy, call can be transferred to refill team.   1. Which medications need to be refilled? (please list name of each medication and dose if known) ELIQUIS 5 MG TABS tablet  2. Which pharmacy/location (including street and city if local pharmacy) is medication to be sent to? [facility]  3. Do they need a 30 day or 90 day supply? 90

## 2018-04-30 NOTE — Progress Notes (Signed)
[place], [place] [address]   CLINIC:  Medical Oncology/Hematology  PCP:  Chipper Herb, [place] [address] [phone]   REASON FOR VISIT: Follow-up for Moderate thrombocytopenia.  CURRENT THERAPY: observation    INTERVAL HISTORY:  [name] 85 y.o. female returns for routine follow-up for Moderate thrombocytopenia. She is here today and doing well. She reports she has minor nose bleeds when she blows her nose about twice a month. Denies any nausea, vomiting, or diarrhea. Denies any new pains. Had not noticed any recent bleeding such as epistaxis, hematuria or hematochezia. Denies recent chest pain on exertion, shortness of breath on minimal exertion, pre-syncopal episodes, or palpitations. Denies any numbness or tingling in hands or feet. Denies any recent fevers, infections, or recent hospitalizations. Patient reports appetite at 50% and energy level at 50%. She recently lost her daughter to a heart attack and her daughter is usually with her to help her keep information straight. She is still able to drive and lives alone and handles her own ADLs.   REVIEW OF SYSTEMS:  Review of Systems  Respiratory: Positive for shortness of breath.   Gastrointestinal: Positive for constipation.  All other systems reviewed and are negative.    PAST MEDICAL/SURGICAL HISTORY:  Past Medical History:  Diagnosis Date  . AF (atrial fibrillation) ([place])   . Arthritis   . Diaphragmatic hernia without mention of obstruction or gangrene   . Esophagitis   . First degree atrioventricular block   . Gastric polyp   . GERD (gastroesophageal reflux disease)   . Heme positive stool   . Hyperlipidemia   . Hypertension   . Hypothyroid   . Prolapse of vaginal walls without mention of uterine prolapse   . Rectal polyp    Past Surgical History:  Procedure Laterality Date  . DILATION AND CURETTAGE OF UTERUS    . KIDNEY STONE SURGERY        SOCIAL HISTORY:  Social History   Socioeconomic History  . Marital status: Divorced    Spouse name: Not on file  . Number of children: 2  . Years of education: 10th grade  . Highest education level: 10th grade  Occupational History  . Occupation: retired    Comment: [employer]  Social Needs  . Financial resource strain: Not hard at all  . Food insecurity:    Worry: Never true    Inability: Never true  . Transportation needs:    Medical: No    Non-medical: No  Tobacco Use  . Smoking status: Never Smoker  . Smokeless tobacco: Never Used  Substance and Sexual Activity  . Alcohol use: No  . Drug use: No  . Sexual activity: Not Currently  Lifestyle  . Physical activity:    Days per week: 7 days    Minutes per session: 30 min  . Stress: Not at all  Relationships  . Social connections:    Talks on phone: More than three times a week    Gets together: Three times a week    Attends religious service: More than 4 times per year    Active member of club or organization: No    Attends meetings of clubs or organizations: Never    Relationship status: Divorced  . Intimate partner violence:    Fear of current or ex partner: No    Emotionally abused: No    Physically abused: No    Forced sexual activity: No  Other Topics Concern  . Not on file  Social History Narrative  . Not on file    FAMILY HISTORY:  Family History  Problem Relation Age of Onset  . Hypertension Mother   . Aneurysm Mother        Brain  . Hypertension Father   . Kidney disease Father   . Aneurysm Father        heart  . Arthritis Daughter   . Cancer Daughter        breast  . Hyperlipidemia Sister     CURRENT MEDICATIONS:  Outpatient Encounter Medications as of 04/30/2018  Medication Sig  . albuterol (PROVENTIL HFA;VENTOLIN HFA) 108 (90 Base) MCG/ACT inhaler Inhale 2 puffs into the lungs every 6 (six) hours as needed for wheezing or shortness of breath.  . calcium-vitamin D (OSCAL WITH D)  500-200 MG-UNIT per tablet Take 1 tablet by mouth daily.  . Cholecalciferol (VITAMIN D3) 2000 UNITS TABS Take 1 tablet by mouth daily.    Marland Kitchen ELIQUIS 5 MG TABS tablet TAKE 1 TABLET BY MOUTH TWICE DAILY  . ferrous fumarate-iron polysaccharide complex (TANDEM) 162-115.2 MG CAPS capsule Take 1 capsule by mouth daily with breakfast.  . furosemide (LASIX) 40 MG tablet Take 1 tablet (40 mg total) by mouth daily.  Marland Kitchen gabapentin (NEURONTIN) 100 MG capsule Take 1 capsule (100 mg total) by mouth 2 (two) times daily.  Marland Kitchen KLOR-CON 10 10 MEQ tablet Take 1 tablet (10 mEq total) by mouth daily.  Marland Kitchen levothyroxine (SYNTHROID, LEVOTHROID) 137 MCG tablet Take 1 tablet (137 mcg total) by mouth daily. as directed  . lisinopril (PRINIVIL,ZESTRIL) 40 MG tablet Take 1 tablet (40 mg total) by mouth daily.  Marland Kitchen LORazepam (ATIVAN) 0.5 MG tablet One half tablet to one daily if needed for anxiety  . Multiple Vitamins-Minerals (CENTRUM SILVER) tablet Take 1 tablet by mouth daily.   Marland Kitchen omeprazole (PRILOSEC) 20 MG capsule TAKE 1 CAPSULE BY MOUTH TWICE DAILY BEFORE A MEAL  . polyethylene glycol powder (GLYCOLAX/MIRALAX) powder DISSOLVE 17 GRAMS (ONE CAPFUL) OF POWDER IN WATER & DRINK ONCE DAILY AS NEEDED   Facility-Administered Encounter Medications as of 04/30/2018  Medication  . cyanocobalamin ((VITAMIN B-12)) injection 1,000 mcg    ALLERGIES:  Allergies  Allergen Reactions  . Aspirin Nausea Only  . Codeine Nausea Only  . Sulfa Antibiotics   . Vicodin [Hydrocodone-Acetaminophen] Nausea Only  . Warfarin Sodium Other (See Comments)    Tingling all over  . Cephalexin Rash    Red, rash covering lower limbs.     PHYSICAL EXAM:  ECOG Performance status: 1  Vitals:   04/30/18 1000  BP: (!) 168/66  Pulse: 92  Resp: 18  Temp: 98.1 F (36.7 C)  SpO2: 93%   Filed Weights   04/30/18 1000  Weight: 208 lb 8 oz (94.6 kg)    Physical Exam Constitutional:      Appearance: Normal appearance. She is normal weight.   Musculoskeletal: Normal range of motion.  Skin:    General: Skin is warm and dry.  Neurological:     Mental Status: She is alert and oriented to person, place, and time. Mental status is at baseline.  Psychiatric:        Mood and Affect: Mood normal.        Behavior: Behavior normal.        Thought Content: Thought content normal.        Judgment: Judgment normal.      LABORATORY DATA:  I have  reviewed the labs as listed.  CBC    Component Value Date/Time   WBC 4.7 04/07/2018 1441   RBC 3.44 (L) 04/07/2018 1441   HGB 10.6 (L) 04/07/2018 1441   HGB 10.3 (L) 03/12/2018 0836   HCT 34.2 (L) 04/07/2018 1441   HCT 30.6 (L) 03/12/2018 0836   PLT 78 (L) 04/07/2018 1441   PLT 72 (LL) 03/12/2018 0836   MCV 99.4 04/07/2018 1441   MCV 94 03/12/2018 0836   MCH 30.8 04/07/2018 1441   MCHC 31.0 04/07/2018 1441   RDW 13.7 04/07/2018 1441   RDW 12.6 03/12/2018 0836   LYMPHSABS 0.8 04/07/2018 1441   LYMPHSABS 1.1 03/12/2018 0836   MONOABS 0.5 04/07/2018 1441   EOSABS 0.3 04/07/2018 1441   EOSABS 0.3 03/12/2018 0836   BASOSABS 0.0 04/07/2018 1441   BASOSABS 0.0 03/12/2018 0836   CMP Latest Ref Rng & Units 04/07/2018 02/13/2018 12/20/2017  Glucose 70 - 99 mg/dL 123(H) 98 96  BUN 8 - 23 mg/dL 25(H) 25 23  Creatinine 0.44 - 1.00 mg/dL 1.43(H) 1.42(H) 1.46(H)  Sodium 135 - 145 mmol/L 141 142 143  Potassium 3.5 - 5.1 mmol/L 3.9 4.1 4.8  Chloride 98 - 111 mmol/L 105 100 100  CO2 22 - 32 mmol/L _0 Calcium 8.9 - 10.3 mg/dL 9.1 9.6 9.7  Total Protein 6.5 - 8.1 g/dL 6.9 6.9 -  Total Bilirubin 0.3 - 1.2 mg/dL 0.7 0.6 -  Alkaline Phos 38 - 126 U/L 109 117 -  AST 15 - 41 U/L 23 18 -  ALT 0 - 44 U/L 12 11 -       DIAGNOSTIC IMAGING:  I have independently reviewed the scans and discussed with the patient.   I have reviewed Francene Finders, NP's note and agree with the documentation.  I personally performed a face-to-face visit, made revisions and my assessment and plan is as  follows.    ASSESSMENT & PLAN:   Thrombocytopenia ([place]) 1.  Moderate thrombocytopenia: -Etiology includes MDS versus ITP. - Patient had low platelet count since June 2018 between 71-104.  Did not experience any major bleeding episodes. -She is on Eliquis for paroxysmal atrial fibrillation. -She denies any fevers, night sweats or weight loss in the last 6 months.  She does have occasional hot flashes. - She lives by herself in [place].  Family history significant for daughter with breast cancer, maternal aunt with ovarian cancer and her brother sister died of metastatic cancer. - An ultrasound of the abdomen in January 2019 as well as a CT scan in January 2018 showed normal spleen size. -We did blood work on 04/07/2018.  Her platelet count was low at 78. -Lupus anticoagulant was present.  However this could be because of Eliquis.  Anticardiolipin IgM and anti-beta-2 glycoprotein 1 or slightly elevated at 15.  They are not usually significant unless they are above 40. - We talked about further testing in the follow-up bone marrow biopsy.  We have decided that we will wait until they drop below 50 K.  I plan to repeat her platelet count in 3 months. - We will also repeat lupus anticoagulant prior to next visit.  2.  Normocytic anemia: - Her hemoglobin is 93.5. - T01 and folic acid were within normal limits.  ANA rheumatoid factor was normal.  Serum copper level was normal.  Percent saturation was 14 with ferritin of 129.  SPEP was negative. - Etiology includes CKD.  Creatinine is around 1.4.  I have  told her to start taking 1 iron tablet daily.  She denies any bleeding per rectum or melena.      Orders placed this encounter:  Orders Placed This Encounter  Procedures  . Lupus anticoagulant panel  . CBC with Differential/Platelet  . Comprehensive metabolic panel  . Ferritin  . Iron and TIBC  . Vitamin B12  . Folate  . VITAMIN D 25 Hydroxy (Vit-D Deficiency, Fractures)      Derek Jack, MD [place] [phone]

## 2018-04-30 NOTE — Telephone Encounter (Signed)
LMOVM Dr. Antoine Poche has always prescribed this medication for her. [facility] is requesting medication from his office

## 2018-04-30 NOTE — Assessment & Plan Note (Signed)
1.  Moderate thrombocytopenia: -Etiology includes MDS versus ITP. - Patient had low platelet count since June 2018 between 71-104.  Did not experience any major bleeding episodes. -She is on Eliquis for paroxysmal atrial fibrillation. -She denies any fevers, night sweats or weight loss in the last 6 months.  She does have occasional hot flashes. - She lives by herself in [place].  Family history significant for daughter with breast cancer, maternal aunt with ovarian cancer and her brother sister died of metastatic cancer. - An ultrasound of the abdomen in January 2019 as well as a CT scan in January 2018 showed normal spleen size. -We did blood work on 04/07/2018.  Her platelet count was low at 78. -Lupus anticoagulant was present.  However this could be because of Eliquis.  Anticardiolipin IgM and anti-beta-2 glycoprotein 1 or slightly elevated at 15.  They are not usually significant unless they are above 40. - We talked about further testing in the follow-up bone marrow biopsy.  We have decided that we will wait until they drop below 50 K.  I plan to repeat her platelet count in 3 months. - We will also repeat lupus anticoagulant prior to next visit.  2.  Normocytic anemia: - Her hemoglobin is 84.8. - L50 and folic acid were within normal limits.  ANA rheumatoid factor was normal.  Serum copper level was normal.  Percent saturation was 14 with ferritin of 129.  SPEP was negative. - Etiology includes CKD.  Creatinine is around 1.4.  I have told her to start taking 1 iron tablet daily.  She denies any bleeding per rectum or melena.

## 2018-04-30 NOTE — Patient Instructions (Addendum)
[facility] at [facility] Discharge Instructions   Please start taking 1 iron tablet daily. You can get this Over the counter at your local drug store.   Thank you for choosing [facility] at [facility] to provide your oncology and hematology care.  To afford each patient quality time with our provider, please arrive at least 15 minutes before your scheduled appointment time.   If you have a lab appointment with the Cancer Center please come in thru the  Main Entrance and check in at the main information desk  You need to re-schedule your appointment should you arrive 10 or more minutes late.  We strive to give you quality time with our providers, and arriving late affects you and other patients whose appointments are after yours.  Also, if you no show three or more times for appointments you may be dismissed from the clinic at the providers discretion.     Again, thank you for choosing [facility]  Our hope is that these requests will decrease the amount of time that you wait before being seen by our physicians.       _____________________________________________________________  Should you have questions after your visit to [facility], please contact our office at [phone] between the hours of 8:00 a.m. and 4:30 p.m.  Voicemails left after 4:00 p.m. will not be returned until the following business day.  For prescription refill requests, have your pharmacy contact our office and allow 72 hours.    Cancer Center Support Programs:   > Cancer Support Group  2nd Tuesday of the month 1pm-2pm, [facility]

## 2018-05-08 ENCOUNTER — Encounter: Payer: Self-pay | Admitting: Family Medicine

## 2018-05-08 ENCOUNTER — Ambulatory Visit (INDEPENDENT_AMBULATORY_CARE_PROVIDER_SITE_OTHER): Payer: Medicare Other | Admitting: Family Medicine

## 2018-05-08 VITALS — BP 136/62 | HR 66 | Temp 97.6°F | Ht 64.0 in | Wt 205.0 lb

## 2018-05-08 DIAGNOSIS — K2100 Gastro-esophageal reflux disease with esophagitis, without bleeding: Secondary | ICD-10-CM

## 2018-05-08 DIAGNOSIS — D696 Thrombocytopenia, unspecified: Secondary | ICD-10-CM | POA: Diagnosis not present

## 2018-05-08 DIAGNOSIS — I480 Paroxysmal atrial fibrillation: Secondary | ICD-10-CM

## 2018-05-08 DIAGNOSIS — R140 Abdominal distension (gaseous): Secondary | ICD-10-CM

## 2018-05-08 DIAGNOSIS — I700 Atherosclerosis of aorta: Secondary | ICD-10-CM

## 2018-05-08 NOTE — Patient Instructions (Addendum)
Continue to drink plenty of water and fluids Watch sodium intake more closely Make every effort to lose weight through diet and exercise Wear support hose daily Take omeprazole or Prilosec twice daily before breakfast and supper Continue with Eliquis because of the atrial fibrillation For constipation take MiraLAX twice daily if once daily is not working.  Or, take MiraLAX once daily and add Colace at night.  Or take MiraLAX once daily and add senna S to this.  What ever combination works do this to help with the constipation. Avoid milk cheese ice cream and dairy products

## 2018-05-08 NOTE — Progress Notes (Signed)
Subjective:    Patient ID: [name], female    DOB: 03-May-1932, 85 y.o.   MRN: [id number]  HPI Patient here today to discuss her medications and she is also having some abdominal discomfort.  The patient has questions to ask about Eliquis MiraLAX and her platelet count.  She does have some abdominal burning or a sensation of this.  Also of note is that her daughter passed away recently.  Her vital signs were stable.  Patient has history of GERD arthritis hyperlipidemia hypertension and thyroid disease.  She also has had atrial fibrillation.  She is followed by cardiology, Dr. Antoine PocheHochrein.  Also followed by hematology because of thrombocytopenia.  She has been seen by Dr. Everlena CooperJaffe because of headaches.  Her most recent endoscopy was by Dr. Madilyn FiremanHayes and he has retired.  Patient reviewed the story of her daughter's death which was from a heart attack.  She had obesity hypertension and diabetes and congestive heart failure.  Today she denies any chest pain or discomfort other than the bloatedness that she has from her stomach and this goes and affects her shortness of breath.  She does describe the bloatedness.  She thinks it is more constipation related and is currently taking MiraLAX and we did discuss taking MiraLAX twice a day or adding Colace or senna S to the MiraLAX once a day and she is going to try any of these combinations to see what works best to help her bowels move regularly.  She denies any trouble with passing her water.  I did tell her that she needs to stay on the Eliquis because of the atrial fibrillation.  She has questions about the MiraLAX affect the platelet count and I told her not to my knowledge.  The abdominal burning she has she says that it is inside but she does hurt and have burning sensation in her back and has mentioned to her this could be some arthritis affecting the nerves in her back and to her abdominal side and she understands that and will take Tylenol if needed for the pain  and burning.   Patient Active Problem List   Diagnosis Date Noted  . Non-rheumatic mitral regurgitation 03/20/2017  . Thrombocytopenia (HCC) 09/14/2016  . Hemoglobin decreased 09/14/2016  . Hearing loss 01/06/2015  . Major depressive disorder, recurrent episode, moderate (HCC) 06/07/2014  . Thoracic aorta atherosclerosis (HCC) 03/04/2014  . Degenerative arthritis of thoracic spine 03/04/2014  . Osteoporosis, post-menopausal 08/12/2013  . Metabolic syndrome 06/23/2013  . Anemia, iron deficiency 03/16/2013  . Vitamin D deficiency 03/16/2013  . HTN (hypertension) 03/16/2013  . Hypothyroidism   . Diaphragmatic hernia without mention of obstruction or gangrene   . GERD (gastroesophageal reflux disease)   . Hyperlipidemia   . Prolapse of vaginal walls without mention of uterine prolapse   . Arthritis   . First degree atrioventricular block   . Symptomatic menopausal or female climacteric states   . AF (atrial fibrillation) (HCC)   . Rectal polyp   . Gastric polyp    Outpatient Encounter Medications as of 05/08/2018  Medication Sig  . albuterol (PROVENTIL HFA;VENTOLIN HFA) 108 (90 Base) MCG/ACT inhaler Inhale 2 puffs into the lungs every 6 (six) hours as needed for wheezing or shortness of breath.  Marland Kitchen. apixaban (ELIQUIS) 5 MG TABS tablet Take 1 tablet (5 mg total) by mouth 2 (two) times daily.  . calcium-vitamin D (OSCAL WITH D) 500-200 MG-UNIT per tablet Take 1 tablet by mouth daily.  .Marland Kitchen  Cholecalciferol (VITAMIN D3) 2000 UNITS TABS Take 1 tablet by mouth daily.    . ferrous fumarate-iron polysaccharide complex (TANDEM) 162-115.2 MG CAPS capsule Take 1 capsule by mouth daily with breakfast.  . furosemide (LASIX) 40 MG tablet Take 1 tablet (40 mg total) by mouth daily.  Marland Kitchen gabapentin (NEURONTIN) 100 MG capsule Take 1 capsule (100 mg total) by mouth 2 (two) times daily.  Marland Kitchen KLOR-CON 10 10 MEQ tablet Take 1 tablet (10 mEq total) by mouth daily.  Marland Kitchen levothyroxine (SYNTHROID, LEVOTHROID) 137 MCG  tablet Take 1 tablet (137 mcg total) by mouth daily. as directed  . lisinopril (PRINIVIL,ZESTRIL) 40 MG tablet Take 1 tablet (40 mg total) by mouth daily.  Marland Kitchen LORazepam (ATIVAN) 0.5 MG tablet One half tablet to one daily if needed for anxiety  . Multiple Vitamins-Minerals (CENTRUM SILVER) tablet Take 1 tablet by mouth daily.   Marland Kitchen omeprazole (PRILOSEC) 20 MG capsule TAKE 1 CAPSULE BY MOUTH TWICE DAILY BEFORE A MEAL  . polyethylene glycol powder (GLYCOLAX/MIRALAX) powder DISSOLVE 17 GRAMS (ONE CAPFUL) OF POWDER IN WATER & DRINK ONCE DAILY AS NEEDED   Facility-Administered Encounter Medications as of 05/08/2018  Medication  . cyanocobalamin ((VITAMIN B-12)) injection 1,000 mcg      Review of Systems  Constitutional: Negative.   HENT: Negative.   Eyes: Negative.   Respiratory: Negative.   Cardiovascular: Negative.   Gastrointestinal: Positive for abdominal pain (discomfort - "burning" ).  Endocrine: Negative.   Genitourinary: Negative.   Musculoskeletal: Negative.   Skin: Negative.   Allergic/Immunologic: Negative.   Neurological: Negative.   Hematological: Negative.   Psychiatric/Behavioral: Negative.        Objective:   Physical Exam Vitals signs and nursing note reviewed.  Constitutional:      General: She is not in acute distress.    Appearance: Normal appearance. She is well-developed. She is obese. She is not ill-appearing.     Comments: Patient is pleasant and alert and just lost her daughter we did discuss this for the first few minutes of her visit.  HENT:     Head: Normocephalic and atraumatic.     Right Ear: Tympanic membrane, ear canal and external ear normal. There is no impacted cerumen.     Left Ear: Tympanic membrane, ear canal and external ear normal. There is no impacted cerumen.     Nose: Nose normal. No congestion.     Mouth/Throat:     Mouth: Mucous membranes are moist.     Pharynx: Oropharynx is clear. No oropharyngeal exudate or posterior oropharyngeal  erythema.  Eyes:     General: No scleral icterus.       Right eye: No discharge.        Left eye: No discharge.     Conjunctiva/sclera: Conjunctivae normal.     Pupils: Pupils are equal, round, and reactive to light.  Neck:     Musculoskeletal: Normal range of motion and neck supple.     Thyroid: No thyromegaly.     Vascular: Carotid bruit present. No JVD.  Cardiovascular:     Rate and Rhythm: Normal rate. Rhythm irregular.     Pulses: Normal pulses.     Heart sounds: Murmur present.     Comments: Heart was irregular irregular at about 72/min with a grade 2/6 systolic ejection murmur, lower extremity pulses were palpable with some pedal edema with support hose on. Pulmonary:     Effort: Pulmonary effort is normal. No respiratory distress.     Breath sounds:  Normal breath sounds. No wheezing or rales.     Comments: Clear anteriorly and posteriorly Abdominal:     General: Abdomen is flat. Bowel sounds are normal.     Palpations: Abdomen is soft. There is no mass.     Tenderness: There is no abdominal tenderness. There is no guarding.     Comments: Generalized tenderness but nothing specific.  Some suprapubic and epigastric tenderness.  No masses or organ enlargement palpable.  Musculoskeletal: Normal range of motion.        General: No tenderness.  Lymphadenopathy:     Cervical: No cervical adenopathy.  Skin:    General: Skin is warm and dry.     Findings: No rash.  Neurological:     General: No focal deficit present.     Mental Status: She is alert and oriented to person, place, and time. Mental status is at baseline.     Cranial Nerves: No cranial nerve deficit.     Gait: Gait normal.     Deep Tendon Reflexes: Reflexes are normal and symmetric. Reflexes normal.  Psychiatric:        Mood and Affect: Mood normal.        Behavior: Behavior normal.        Thought Content: Thought content normal.        Judgment: Judgment normal.     Comments: Mood affect and behavior were  somewhat down secondary to the loss of her daughter recently.    BP 136/62 (BP Location: Left Arm)   Pulse 66   Temp 97.6 F (36.4 C) (Oral)   Ht 5\' 4"  (1.626 m)   Wt 205 lb (93 kg)   BMI 35.19 kg/m         Assessment & Plan:  1. Gastroesophageal reflux disease with esophagitis -Continue with omeprazole 20 twice daily before breakfast and supper  2. Thrombocytopenia (HCC) -Follow-up with hematology as needed  3. Morbid obesity (HCC) -Continue to work on weight loss through diet and exercise as much as possible and avoid Cheetos.  4. Paroxysmal atrial fibrillation (HCC) -Follow-up with cardiology as planned and stay on Eliquis  5. Thoracic aorta atherosclerosis (HCC) -Continue aggressive therapeutic lifestyle changes  6. Bloated abdomen -Control constipation with MiraLAX, Colace, or senna or any combination of the 2.  Drink plenty of water  Patient Instructions  Continue to drink plenty of water and fluids Watch sodium intake more closely Make every effort to lose weight through diet and exercise Wear support hose daily Take omeprazole or Prilosec twice daily before breakfast and supper Continue with Eliquis because of the atrial fibrillation For constipation take MiraLAX twice daily if once daily is not working.  Or, take MiraLAX once daily and add Colace at night.  Or take MiraLAX once daily and add senna S to this.  What ever combination works do this to help with the constipation. Avoid milk cheese ice cream and dairy products  Nyra Capeson W. Moore MD

## 2018-05-16 ENCOUNTER — Ambulatory Visit (INDEPENDENT_AMBULATORY_CARE_PROVIDER_SITE_OTHER): Payer: Medicare Other | Admitting: *Deleted

## 2018-05-16 DIAGNOSIS — E538 Deficiency of other specified B group vitamins: Secondary | ICD-10-CM

## 2018-05-20 ENCOUNTER — Ambulatory Visit (INDEPENDENT_AMBULATORY_CARE_PROVIDER_SITE_OTHER): Payer: Medicare Other | Admitting: Family Medicine

## 2018-05-20 ENCOUNTER — Encounter: Payer: Self-pay | Admitting: Family Medicine

## 2018-05-20 VITALS — BP 138/58 | HR 63 | Temp 97.3°F | Ht 64.0 in | Wt 202.0 lb

## 2018-05-20 DIAGNOSIS — J069 Acute upper respiratory infection, unspecified: Secondary | ICD-10-CM

## 2018-05-20 DIAGNOSIS — B9789 Other viral agents as the cause of diseases classified elsewhere: Secondary | ICD-10-CM | POA: Diagnosis not present

## 2018-05-20 MED ORDER — HYDROCODONE-HOMATROPINE 5-1.5 MG/5ML PO SYRP: 5.0000 mL | ORAL_SOLUTION | Freq: Three times a day (TID) | ORAL | 0 refills | Status: DC | PRN

## 2018-05-20 NOTE — Progress Notes (Signed)
Subjective: CC: Cough PCP: Ernestina Penna, MD QQI:[id number] HERMON ESCHBACH is a 85 y.o. female presenting to clinic today for:  1.  Cough Patient reports a productive cough that is been ongoing for about 3 days now.  She reports that she has been coughing so much that sometimes her back hurts.  She used 3 days worth of prednisone that she had leftover from a bronchitis previously and notes that this did seem to improve symptoms some.  Denies any shortness of breath but does feel like she was initially wheezing at onset of symptoms.  No measured fevers.  No hemoptysis.  She has had some sinus drainage but no purulence.  No other OTC medicines used for symptoms.   ROS: Per HPI  Allergies  Allergen Reactions  . Aspirin Nausea Only  . Codeine Nausea Only  . Sulfa Antibiotics   . Vicodin [Hydrocodone-Acetaminophen] Nausea Only  . Warfarin Sodium Other (See Comments)    Tingling all over  . Cephalexin Rash    Red, rash covering lower limbs.   Past Medical History:  Diagnosis Date  . AF (atrial fibrillation) (HCC)   . Arthritis   . Diaphragmatic hernia without mention of obstruction or gangrene   . Esophagitis   . First degree atrioventricular block   . Gastric polyp   . GERD (gastroesophageal reflux disease)   . Heme positive stool   . Hyperlipidemia   . Hypertension   . Hypothyroid   . Prolapse of vaginal walls without mention of uterine prolapse   . Rectal polyp     Current Outpatient Medications:  .  albuterol (PROVENTIL HFA;VENTOLIN HFA) 108 (90 Base) MCG/ACT inhaler, Inhale 2 puffs into the lungs every 6 (six) hours as needed for wheezing or shortness of breath., Disp: 8 g, Rfl: 1 .  apixaban (ELIQUIS) 5 MG TABS tablet, Take 1 tablet (5 mg total) by mouth 2 (two) times daily., Disp: 180 tablet, Rfl: 1 .  calcium-vitamin D (OSCAL WITH D) 500-200 MG-UNIT per tablet, Take 1 tablet by mouth daily., Disp: , Rfl:  .  Cholecalciferol (VITAMIN D3) 2000 UNITS TABS, Take 1 tablet by mouth  daily.  , Disp: , Rfl:  .  ferrous fumarate-iron polysaccharide complex (TANDEM) 162-115.2 MG CAPS capsule, Take 1 capsule by mouth daily with breakfast., Disp: 30 capsule, Rfl: 2 .  furosemide (LASIX) 40 MG tablet, Take 1 tablet (40 mg total) by mouth daily., Disp: 90 tablet, Rfl: 0 .  gabapentin (NEURONTIN) 100 MG capsule, Take 1 capsule (100 mg total) by mouth 2 (two) times daily., Disp: 180 capsule, Rfl: 0 .  KLOR-CON 10 10 MEQ tablet, Take 1 tablet (10 mEq total) by mouth daily., Disp: 90 tablet, Rfl: 0 .  levothyroxine (SYNTHROID, LEVOTHROID) 137 MCG tablet, Take 1 tablet (137 mcg total) by mouth daily. as directed, Disp: 90 tablet, Rfl: 1 .  lisinopril (PRINIVIL,ZESTRIL) 40 MG tablet, Take 1 tablet (40 mg total) by mouth daily., Disp: 90 tablet, Rfl: 0 .  LORazepam (ATIVAN) 0.5 MG tablet, One half tablet to one daily if needed for anxiety, Disp: 30 tablet, Rfl: 5 .  Multiple Vitamins-Minerals (CENTRUM SILVER) tablet, Take 1 tablet by mouth daily. , Disp: , Rfl:  .  omeprazole (PRILOSEC) 20 MG capsule, TAKE 1 CAPSULE BY MOUTH TWICE DAILY BEFORE A MEAL, Disp: 180 capsule, Rfl: 0 .  polyethylene glycol powder (GLYCOLAX/MIRALAX) powder, DISSOLVE 17 GRAMS (ONE CAPFUL) OF POWDER IN WATER & DRINK ONCE DAILY AS NEEDED, Disp: 850 g, Rfl: 1  Current Facility-Administered Medications:  .  cyanocobalamin ((VITAMIN B-12)) injection 1,000 mcg, 1,000 mcg, Intramuscular, Q30 days, Ernestina PennaMoore, Donald W, MD, 1,000 mcg at 05/16/18 [id number] Social History   Socioeconomic History  . Marital status: Divorced    Spouse name: Not on file  . Number of children: 2  . Years of education: 10th grade  . Highest education level: 10th grade  Occupational History  . Occupation: retired    Comment: [employer]  Social Needs  . Financial resource strain: Not hard at all  . Food insecurity:    Worry: Never true    Inability: Never true  . Transportation needs:    Medical: No    Non-medical: No  Tobacco Use  . Smoking  status: Never Smoker  . Smokeless tobacco: Never Used  Substance and Sexual Activity  . Alcohol use: No  . Drug use: No  . Sexual activity: Not Currently  Lifestyle  . Physical activity:    Days per week: 7 days    Minutes per session: 30 min  . Stress: Not at all  Relationships  . Social connections:    Talks on phone: More than three times a week    Gets together: Three times a week    Attends religious service: More than 4 times per year    Active member of club or organization: No    Attends meetings of clubs or organizations: Never    Relationship status: Divorced  . Intimate partner violence:    Fear of current or ex partner: No    Emotionally abused: No    Physically abused: No    Forced sexual activity: No  Other Topics Concern  . Not on file  Social History Narrative  . Not on file   Family History  Problem Relation Age of Onset  . Hypertension Mother   . Aneurysm Mother        Brain  . Hypertension Father   . Kidney disease Father   . Aneurysm Father        heart  . Arthritis Daughter   . Cancer Daughter        breast  . Hyperlipidemia Sister     Objective: Office vital signs reviewed. BP (!) 138/58   Pulse 63   Temp (!) 97.3 F (36.3 C) (Oral)   Ht 5\' 4"  (1.626 m)   Wt 202 lb (91.6 kg)   SpO2 97%   BMI 34.67 kg/m   Physical Examination:  General: Awake, alert, well appearing, No acute distress HEENT: Normal    Neck: No masses palpated. No lymphadenopathy    Ears:  Hearing aids in place    Eyes: PERRLA, extraocular membranes intact, sclera white    Nose: nasal turbinates moist, clear nasal discharge    Throat: moist mucus membranes, no erythema, no tonsillar exudate.  Airway is patent Cardio: regular rate and rhythm, S1S2 heard, soft murmurs appreciated Pulm: clear to auscultation bilaterally, no wheezes, rhonchi or rales; normal work of breathing on room air  Assessment/ Plan: 85 y.o. female   1. Viral URI with cough Patient is  afebrile nontoxic-appearing with normal oxygenation on room air.  Her physical exam was unremarkable.  I do not suspect bacterial infection at this point.  I have recommended supportive care at home.  She wanted something for cough and notes that only the syrup that was provided seems to help.  Hycodan refilled and I have advised her to use this sparingly and never with her Lorazepam.  We discussed the sedative properties of the Hycodan and she is not to drive or operate any heavy machinery while taking.  We discussed reasons for return and emergent evaluation the emergency department.  She will follow-up with PCP for ongoing chronic needs.  The Narcotic Database has been reviewed.  There were no red flags.    No orders of the defined types were placed in this encounter.  Meds ordered this encounter  Medications  . HYDROcodone-homatropine (HYCODAN) 5-1.5 MG/5ML syrup    Sig: Take 5 mLs by mouth every 8 (eight) hours as needed for cough.    Dispense:  75 mL    Refill:  0     Johnnye Sandford Hulen Skains, DO [facility] [phone]

## 2018-05-20 NOTE — Patient Instructions (Signed)
I have prescribed your cough syrup.  Use this very sparingly and with extreme caution as it does cause medicines that can cause sleepiness and increase your risk of falls.  Do not drive while taking this medicine.  We discussed reasons for return and emergent evaluation the emergency department.  Your symptoms should clear up in the next few days but if they continue, I would like you to be rechecked.  It appears that you have a viral upper respiratory infection (cold).  Cold symptoms can last up to 2 weeks.  I recommend that you only use cold medications that are safe in high blood pressure like Coricidin (generic is fine).  Other cold medications can increase your blood pressure.    - Get plenty of rest and drink plenty of fluids. - Try to breathe moist air. Use a cold mist humidifier. - Consume warm fluids (soup or tea) to provide relief for a stuffy nose and to loosen phlegm. - For nasal stuffiness, try saline nasal spray or a Neti Pot.  Afrin nasal spray can also be used but this product should not be used longer than 3 days or it will cause rebound nasal stuffiness (worsening nasal congestion). - For sore throat pain relief: use chloraseptic spray, suck on throat lozenges, hard candy or popsicles; gargle with warm salt water (1/4 tsp. salt per 8 oz. of water); and eat soft, bland foods. - Eat a well-balanced diet. If you cannot, ensure you are getting enough nutrients by taking a daily multivitamin. - Avoid dairy products, as they can thicken phlegm. - Avoid alcohol, as it impairs your body's immune system.  CONTACT YOUR DOCTOR IF YOU EXPERIENCE ANY OF THE FOLLOWING: - High fever - Ear pain - Sinus-type headache - Unusually severe cold symptoms - Cough that gets worse while other cold symptoms improve - Flare up of any chronic lung problem, such as asthma - Your symptoms persist longer than 2 weeks

## 2018-05-27 DIAGNOSIS — B351 Tinea unguium: Secondary | ICD-10-CM | POA: Diagnosis not present

## 2018-05-27 DIAGNOSIS — M79676 Pain in unspecified toe(s): Secondary | ICD-10-CM | POA: Diagnosis not present

## 2018-05-27 DIAGNOSIS — I70203 Unspecified atherosclerosis of native arteries of extremities, bilateral legs: Secondary | ICD-10-CM | POA: Diagnosis not present

## 2018-05-27 DIAGNOSIS — L84 Corns and callosities: Secondary | ICD-10-CM | POA: Diagnosis not present

## 2018-06-16 ENCOUNTER — Ambulatory Visit (INDEPENDENT_AMBULATORY_CARE_PROVIDER_SITE_OTHER): Payer: Medicare Other | Admitting: Nurse Practitioner

## 2018-06-16 ENCOUNTER — Encounter: Payer: Self-pay | Admitting: Nurse Practitioner

## 2018-06-16 ENCOUNTER — Ambulatory Visit (INDEPENDENT_AMBULATORY_CARE_PROVIDER_SITE_OTHER): Payer: Medicare Other | Admitting: *Deleted

## 2018-06-16 ENCOUNTER — Other Ambulatory Visit: Payer: Self-pay

## 2018-06-16 VITALS — BP 141/70 | HR 60 | Temp 96.8°F | Ht 64.0 in | Wt 203.0 lb

## 2018-06-16 DIAGNOSIS — L03012 Cellulitis of left finger: Secondary | ICD-10-CM | POA: Diagnosis not present

## 2018-06-16 DIAGNOSIS — E538 Deficiency of other specified B group vitamins: Secondary | ICD-10-CM | POA: Diagnosis not present

## 2018-06-16 MED ORDER — DOXYCYCLINE HYCLATE 100 MG PO TABS: 100.0000 mg | ORAL_TABLET | Freq: Two times a day (BID) | ORAL | 0 refills | Status: DC

## 2018-06-16 NOTE — Progress Notes (Signed)
   Subjective:    Patient ID: [name], female    DOB: 1932-10-24, 85 y.o.   MRN: [id number]   Chief Complaint: Left index finger sweollen (No injury)   HPI Patient comes in with right index finger red and throbbing. Started  2 days ago and as gotten worse.   Review of Systems  Constitutional: Negative.   Respiratory: Negative.   Cardiovascular: Negative.   Genitourinary: Negative.   Neurological: Negative.   Psychiatric/Behavioral: Negative.   All other systems reviewed and are negative.      Objective:   Physical Exam Vitals signs and nursing note reviewed.  Constitutional:      Appearance: Normal appearance.  Cardiovascular:     Rate and Rhythm: Normal rate and regular rhythm.     Heart sounds: Normal heart sounds.  Pulmonary:     Breath sounds: Normal breath sounds.  Skin:    General: Skin is warm.     Comments: Right index finger erythematous and tender around nail bed.  Neurological:     General: No focal deficit present.     Mental Status: She is alert.  Psychiatric:        Mood and Affect: Mood normal.    BP (!) 141/70   Pulse 60   Temp (!) 96.8 F (36 C) (Oral)   Ht 5\' 4"  (1.626 m)   Wt 203 lb (92.1 kg)   BMI 34.84 kg/m        Assessment & Plan:  [name] in today with chief complaint of Left index finger sweollen (No injury)   1. Paronychia of left index finger Soak in epsom salt bid Meds ordered this encounter  Medications  . doxycycline (VIBRA-TABS) 100 MG tablet    Sig: Take 1 tablet (100 mg total) by mouth 2 (two) times daily. 1 po bid    Dispense:  20 tablet    Refill:  0    Order Specific Question:   Supervising Provider    Answer:   Hyacinth Meeker, BRIAN [[id number]]   RTO if not improving in 2 days  Mary-Margaret Daphine Deutscher, FNP

## 2018-06-16 NOTE — Progress Notes (Signed)
Pt given cyanocobalamin inj Tolerated well 

## 2018-06-16 NOTE — Patient Instructions (Signed)
Paronychia  Paronychia is an infection of the skin. It happens near a fingernail or toenail. It may cause pain and swelling around the nail. In some cases, a fluid-filled bump (abscess) can form near or under the nail.  Usually, this condition is not serious, and it clears up with treatment.  Follow these instructions at home:  Wound care   Keep the affected area clean.   Soak the fingers or toes in warm water as told by your doctor. You may be told to do this for 20 minutes, 2-3 times a day.   Keep the area dry when you are not soaking it.   Do not try to drain a fluid-filled bump on your own.   Follow instructions from your doctor about how to take care of the affected area. Make sure you:  ? Wash your hands with soap and water before you change your bandage (dressing). If you cannot use soap and water, use hand sanitizer.  ? Change your bandage as told by your doctor.   If you had a fluid-filled bump and your doctor drained it, check the area every day for signs of infection. Check for:  ? Redness, swelling, or pain.  ? Fluid or blood.  ? Warmth.  ? Pus or a bad smell.  Medicines     Take over-the-counter and prescription medicines only as told by your doctor.   If you were prescribed an antibiotic medicine, take it as told by your doctor. Do not stop taking it even if you start to feel better.  General instructions   Avoid touching any chemicals.   Do not pick at the affected area.  Prevention   To prevent this condition from happening again:  ? Wear rubber gloves when putting your hands in water for washing dishes or other tasks.  ? Wear gloves if your hands might touch cleaners or chemicals.  ? Avoid injuring your nails or fingertips.  ? Do not bite your nails or tear hangnails.  ? Do not cut your nails very short.  ? Do not cut the skin at the base and sides of the nail (cuticles).  ? Use clean nail clippers or scissors when trimming nails.  Contact a doctor if:   You feel worse.   You do not get  better.   You have more fluid, blood, or pus coming from the affected area.   Your finger or knuckle is swollen or is hard to move.  Get help right away if you have:   A fever or chills.   Redness spreading from the affected area.   Pain in a joint or muscle.  Summary   Paronychia is an infection of the skin. It happens near a fingernail or toenail.   This condition may cause pain and swelling around the nail.   Soak the fingers or toes in warm water as told by your doctor.   Usually, this condition is not serious, and it clears up with treatment.  This information is not intended to replace advice given to you by your health care provider. Make sure you discuss any questions you have with your health care provider.  Document Released: 03/07/2009 Document Revised: 04/01/2017 Document Reviewed: 04/01/2017  [employer]vier Interactive Patient Education  2019 [employer]

## 2018-07-03 ENCOUNTER — Telehealth: Payer: Self-pay | Admitting: Family Medicine

## 2018-07-03 NOTE — Telephone Encounter (Signed)
Called patient and changed office visit for Monday to a telephone visit

## 2018-07-07 ENCOUNTER — Other Ambulatory Visit: Payer: Self-pay

## 2018-07-07 ENCOUNTER — Encounter: Payer: Self-pay | Admitting: Family Medicine

## 2018-07-07 ENCOUNTER — Ambulatory Visit (INDEPENDENT_AMBULATORY_CARE_PROVIDER_SITE_OTHER): Payer: Medicare Other | Admitting: Family Medicine

## 2018-07-07 DIAGNOSIS — I10 Essential (primary) hypertension: Secondary | ICD-10-CM

## 2018-07-07 DIAGNOSIS — E538 Deficiency of other specified B group vitamins: Secondary | ICD-10-CM | POA: Diagnosis not present

## 2018-07-07 DIAGNOSIS — D696 Thrombocytopenia, unspecified: Secondary | ICD-10-CM | POA: Diagnosis not present

## 2018-07-07 DIAGNOSIS — I480 Paroxysmal atrial fibrillation: Secondary | ICD-10-CM | POA: Diagnosis not present

## 2018-07-07 DIAGNOSIS — K219 Gastro-esophageal reflux disease without esophagitis: Secondary | ICD-10-CM

## 2018-07-07 DIAGNOSIS — E034 Atrophy of thyroid (acquired): Secondary | ICD-10-CM

## 2018-07-07 DIAGNOSIS — I700 Atherosclerosis of aorta: Secondary | ICD-10-CM | POA: Diagnosis not present

## 2018-07-07 DIAGNOSIS — E559 Vitamin D deficiency, unspecified: Secondary | ICD-10-CM

## 2018-07-07 MED ORDER — LISINOPRIL 40 MG PO TABS: 40.0000 mg | ORAL_TABLET | Freq: Every day | ORAL | 0 refills | Status: DC

## 2018-07-07 MED ORDER — FUROSEMIDE 40 MG PO TABS: 40.0000 mg | ORAL_TABLET | Freq: Every day | ORAL | 0 refills | Status: DC

## 2018-07-07 NOTE — Patient Instructions (Signed)
Practice good respiratory and hand hygiene Drink plenty of water Continue to be careful and not put self at risk for falling  Follow up with specialists as planned

## 2018-07-07 NOTE — Progress Notes (Signed)
Virtual Visit via telephone Note I connected with@ on 07/07/18 by telephone and verified that I am speaking with the correct person or authorized healthcare agent using two identifiers. WILODEAN PUROHIT is currently located at home and there are no unauthorized people in close proximity. I, Rudi Heap, MD, completed this visit while in a private location in my home .  This visit type was conducted due to national recommendations for restrictions regarding the COVID-19 Pandemic (e.g. social distancing).  This format is felt to be most appropriate for this patient at this time.  All issues noted in this document were discussed and addressed.  No physical exam was performed.    I discussed the limitations, risks, security and privacy concerns of performing an evaluation and management service by telephone and the availability of in person appointments. I also discussed with the patient that there may be a patient responsible charge related to this service. The patient expressed understanding and agreed to proceed.   Date:  07/07/2018    ID:  Verna Czech      07/11/1932        [id number]   Patient Care Team Patient Care Team: Ernestina Penna, MD as PCP - General (Family Medicine) Rollene Rotunda, MD as Consulting Physician (Cardiology) Normajean Glasgow, MD (Pain Medicine)  Reason for Visit: Primary Care Follow-up     History of Present Illness & Review of Systems:     Milada Desforges Mise is a 85 y.o. year old female primary care patient that presents today for a telehealth visit.  Patient is alert and doing well. No particular complaints. She denies chest pain pressure or tightness. She denies shortness of breath.. She is taking her prilosec regularly and has no trouble with swallowing, heart burn, nausea vomiting or blood in the stool or black tarry bowel movements. She is passing her water well.She understands the seriousness of the current issues with covid 19. And is staying in as much as  possible.     Review of systems as stated otherwise negative for body systems left unmentioned.   The patient does not have symptoms concerning for COVID-19 infection (fever, chills, cough, or new shortness of breath).      Current Medications (Verified) Allergies as of 07/07/2018      Reactions   Aspirin Nausea Only   Codeine Nausea Only   Sulfa Antibiotics    Vicodin [hydrocodone-acetaminophen] Nausea Only   Warfarin Sodium Other (See Comments)   Tingling all over   Cephalexin Rash   Red, rash covering lower limbs.      Medication List       Accurate as of July 07, 2018 10:23 AM. Always use your most recent med list.        albuterol 108 (90 Base) MCG/ACT inhaler Commonly known as:  PROVENTIL HFA;VENTOLIN HFA Inhale 2 puffs into the lungs every 6 (six) hours as needed for wheezing or shortness of breath.   apixaban 5 MG Tabs tablet Commonly known as:  Eliquis Take 1 tablet (5 mg total) by mouth 2 (two) times daily.   calcium-vitamin D 500-200 MG-UNIT tablet Commonly known as:  OSCAL WITH D Take 1 tablet by mouth daily.   Centrum Silver tablet Take 1 tablet by mouth daily.   doxycycline 100 MG tablet Commonly known as:  VIBRA-TABS Take 1 tablet (100 mg total) by mouth 2 (two) times daily. 1 po bid   ferrous fumarate-iron polysaccharide complex 162-115.2 MG Caps capsule Commonly  known as:  TANDEM Take 1 capsule by mouth daily with breakfast.   furosemide 40 MG tablet Commonly known as:  LASIX Take 1 tablet (40 mg total) by mouth daily.   gabapentin 100 MG capsule Commonly known as:  NEURONTIN Take 1 capsule (100 mg total) by mouth 2 (two) times daily.   Klor-Con 10 10 MEQ tablet Generic drug:  potassium chloride Take 1 tablet (10 mEq total) by mouth daily.   levothyroxine 137 MCG tablet Commonly known as:  SYNTHROID, LEVOTHROID Take 1 tablet (137 mcg total) by mouth daily. as directed   lisinopril 40 MG tablet Commonly known as:   PRINIVIL,ZESTRIL Take 1 tablet (40 mg total) by mouth daily.   LORazepam 0.5 MG tablet Commonly known as:  ATIVAN One half tablet to one daily if needed for anxiety   omeprazole 20 MG capsule Commonly known as:  PRILOSEC TAKE 1 CAPSULE BY MOUTH TWICE DAILY BEFORE A MEAL   polyethylene glycol powder powder Commonly known as:  GLYCOLAX/MIRALAX DISSOLVE 17 GRAMS (ONE CAPFUL) OF POWDER IN WATER & DRINK ONCE DAILY AS NEEDED   Vitamin D3 50 MCG (2000 UT) Tabs Take 1 tablet by mouth daily.           Allergies (Verified)    Aspirin; Codeine; Sulfa antibiotics; Vicodin [hydrocodone-acetaminophen]; Warfarin sodium; and Cephalexin  Past Medical History Past Medical History:  Diagnosis Date   AF (atrial fibrillation) (HCC)    Arthritis    Diaphragmatic hernia without mention of obstruction or gangrene    Esophagitis    First degree atrioventricular block    Gastric polyp    GERD (gastroesophageal reflux disease)    Heme positive stool    Hyperlipidemia    Hypertension    Hypothyroid    Prolapse of vaginal walls without mention of uterine prolapse    Rectal polyp      Past Surgical History:  Procedure Laterality Date   DILATION AND CURETTAGE OF UTERUS     KIDNEY STONE SURGERY      Social History   Socioeconomic History   Marital status: Divorced    Spouse name: Not on file   Number of children: 2   Years of education: 10th grade   Highest education level: 10th grade  Occupational History   Occupation: retired    Comment: [employer]  Ecologistocial Needs   Financial resource strain: Not hard at [employer]   Food insecurity:    Worry: Never true    Inability: Never true   Transportation needs:    Medical: No    Non-medical: No  Tobacco Use   Smoking status: Never Smoker   Smokeless tobacco: Never Used  Substance and Sexual Activity   Alcohol use: No   Drug use: No   Sexual activity: Not Currently  Lifestyle   Physical activity:    Days per week:  7 days    Minutes per session: 30 min   Stress: Not at all  Relationships   Social connections:    Talks on phone: More than three times a week    Gets together: Three times a week    Attends religious service: More than 4 times per year    Active member of club or organization: No    Attends meetings of clubs or organizations: Never    Relationship status: Divorced  Other Topics Concern   Not on file  Social History Narrative   Not on file     Family History  Problem Relation Age of  Onset   Hypertension Mother    Aneurysm Mother        Brain   Hypertension Father    Kidney disease Father    Aneurysm Father        heart   Arthritis Daughter    Cancer Daughter        breast   Hyperlipidemia Sister       Labs/Other Tests and Data Reviewed:    Wt Readings from Last 3 Encounters:  06/16/18 203 lb (92.1 kg)  05/20/18 202 lb (91.6 kg)  05/08/18 205 lb (93 kg)   Temp Readings from Last 3 Encounters:  06/16/18 (!) 96.8 F (36 C) (Oral)  05/20/18 (!) 97.3 F (36.3 C) (Oral)  05/08/18 97.6 F (36.4 C) (Oral)   BP Readings from Last 3 Encounters:  06/16/18 (!) 141/70  05/20/18 (!) 138/58  05/08/18 136/62   Pulse Readings from Last 3 Encounters:  06/16/18 60  05/20/18 63  05/08/18 66     Lab Results  Component Value Date   HGBA1C 6.2 06/16/2016   HGBA1C 6.0 10/26/2013   HGBA1C 6.0 06/23/2013   Lab Results  Component Value Date   LDLCALC 92 02/13/2018   CREATININE 1.43 (H) 04/07/2018       Chemistry      Component Value Date/Time   NA 141 04/07/2018 1441   NA 142 02/13/2018 1233   K 3.9 04/07/2018 1441   CL 105 04/07/2018 1441   CO2 28 04/07/2018 1441   BUN 25 (H) 04/07/2018 1441   BUN 25 02/13/2018 1233   CREATININE 1.43 (H) 04/07/2018 1441   CREATININE 0.92 10/01/2012 1207      Component Value Date/Time   CALCIUM 9.1 04/07/2018 1441   ALKPHOS 109 04/07/2018 1441   AST 23 04/07/2018 1441   ALT 12 04/07/2018 1441   BILITOT 0.7  04/07/2018 1441   BILITOT 0.6 02/13/2018 1233         OBSERVATIONS/ OBJECTIVE:     Patient is alert and doing well. No particular complaints. She denies chest pain pressure or tightness. She denies shortness of breath.. She is taking her prilosec regularly and has no trouble with swallowing, heart burn, nausea vomiting or blood in the stool or black tarry bowel movements. She is passing her water well.She understands the seriousness of the current issues with covid 19. And is staying in as much as possible.   Physical exam deferred due to nature of telephonic visit.  ASSESSMENT & PLAN    Time:   Today, I have spent 25 minutes with the patient via telephone discussing the above including Covid precautions.     Visit Diagnoses: 1. Morbid obesity (HCC) -continue to work aggressively with diet and exercise to achieve weight loss  2. Thoracic aorta atherosclerosis (HCC) -Continue aggressive TLC  3. Vitamin B 12 deficiency - continue B-12 injections  4. Thrombocytopenia (HCC) -no complaints with bruising or bleeding  5. Paroxysmal atrial fibrillation (HCC) - Follow up with cardiology as planned  6. Vitamin D deficiency -continue with vit D replacement as doing  7. Hypothyroidism due to acquired atrophy of thyroid -continue with thyroid replacement and recheck thyroid at next visit  8. Essential hypertension - continue with salt restriction and current treatment  9. Gastroesophageal reflux disease, esophagitis presence not specified -no complaints with this today and she will continue with prilosec and diet of avoidance   Meds ordered this encounter  Medications   furosemide (LASIX) 40 MG tablet    Sig:  Take 1 tablet (40 mg total) by mouth daily.    Dispense:  90 tablet    Refill:  0   lisinopril (PRINIVIL,ZESTRIL) 40 MG tablet    Sig: Take 1 tablet (40 mg total) by mouth daily.    Dispense:  90 tablet    Refill:  0        Follow-up Instructions:  Patient  Instructions  Practice good respiratory and hand hygiene Drink plenty of water Continue to be careful and not put self at risk for falling  Follow up with specialists as planned   Continue current medications. Continue good therapeutic lifestyle changes which include good diet and exercise. Fall precautions discussed with patient. If an FOBT was given today- please return it to our front desk. If you are over 31 years old - you may need Prevnar 13 or the adult Pneumonia vaccine.  **Flu shots are available--- please call and schedule a FLU-CLINIC appointment**  After your visit with [place] today you will receive a survey in the mail or online from [employer] regarding your care with [place]. Please take a moment to fill this out. Your feedback is very important to [place] as you can help [place] better understand your patient needs as well as improve your experience and satisfaction. WE CARE ABOUT YOU!!!     I discussed the assessment and treatment plan with the patient. The patient was provided an opportunity to ask questions and all were answered. The patient agreed with the plan and demonstrated an understanding of the instructions.   The patient was advised to call back or seek an in-person evaluation if the symptoms worsen or if the condition fails to improve as anticipated.  The above assessment and management plan was discussed with the patient. The patient verbalized understanding of and has agreed to the management plan. Patient is aware to call the clinic if symptoms persist or worsen. Patient is aware when to return to the clinic for a follow-up visit. Patient educated on when it is appropriate to go to the emergency department.    Ernestina Penna, MD [facility] Medicine [address] [place], [place], [place] [address] Ph [phone]   Nyra Capes MD

## 2018-07-11 ENCOUNTER — Other Ambulatory Visit: Payer: Self-pay | Admitting: Family Medicine

## 2018-07-18 ENCOUNTER — Other Ambulatory Visit: Payer: Self-pay

## 2018-07-18 ENCOUNTER — Ambulatory Visit (INDEPENDENT_AMBULATORY_CARE_PROVIDER_SITE_OTHER): Payer: Medicare Other | Admitting: *Deleted

## 2018-07-18 DIAGNOSIS — E538 Deficiency of other specified B group vitamins: Secondary | ICD-10-CM

## 2018-07-29 ENCOUNTER — Other Ambulatory Visit: Payer: Self-pay | Admitting: *Deleted

## 2018-07-29 DIAGNOSIS — B351 Tinea unguium: Secondary | ICD-10-CM | POA: Diagnosis not present

## 2018-07-29 DIAGNOSIS — M79676 Pain in unspecified toe(s): Secondary | ICD-10-CM | POA: Diagnosis not present

## 2018-07-29 DIAGNOSIS — I70203 Unspecified atherosclerosis of native arteries of extremities, bilateral legs: Secondary | ICD-10-CM | POA: Diagnosis not present

## 2018-07-29 DIAGNOSIS — L84 Corns and callosities: Secondary | ICD-10-CM | POA: Diagnosis not present

## 2018-07-29 MED ORDER — KLOR-CON 10 10 MEQ PO TBCR: 10.0000 meq | EXTENDED_RELEASE_TABLET | Freq: Every day | ORAL | 1 refills | Status: DC

## 2018-07-30 ENCOUNTER — Other Ambulatory Visit (HOSPITAL_COMMUNITY): Payer: Self-pay

## 2018-08-05 ENCOUNTER — Other Ambulatory Visit: Payer: Self-pay | Admitting: Family Medicine

## 2018-08-05 DIAGNOSIS — K2100 Gastro-esophageal reflux disease with esophagitis, without bleeding: Secondary | ICD-10-CM

## 2018-08-06 ENCOUNTER — Ambulatory Visit (HOSPITAL_COMMUNITY): Payer: Self-pay | Admitting: Nurse Practitioner

## 2018-08-15 ENCOUNTER — Encounter: Payer: Self-pay | Admitting: Family Medicine

## 2018-08-15 ENCOUNTER — Other Ambulatory Visit: Payer: Self-pay

## 2018-08-15 ENCOUNTER — Ambulatory Visit (INDEPENDENT_AMBULATORY_CARE_PROVIDER_SITE_OTHER): Payer: Medicare Other | Admitting: Family Medicine

## 2018-08-15 DIAGNOSIS — I700 Atherosclerosis of aorta: Secondary | ICD-10-CM

## 2018-08-15 DIAGNOSIS — D508 Other iron deficiency anemias: Secondary | ICD-10-CM

## 2018-08-15 DIAGNOSIS — E538 Deficiency of other specified B group vitamins: Secondary | ICD-10-CM

## 2018-08-15 DIAGNOSIS — E559 Vitamin D deficiency, unspecified: Secondary | ICD-10-CM

## 2018-08-15 DIAGNOSIS — I480 Paroxysmal atrial fibrillation: Secondary | ICD-10-CM

## 2018-08-15 DIAGNOSIS — K219 Gastro-esophageal reflux disease without esophagitis: Secondary | ICD-10-CM | POA: Diagnosis not present

## 2018-08-15 DIAGNOSIS — E034 Atrophy of thyroid (acquired): Secondary | ICD-10-CM | POA: Diagnosis not present

## 2018-08-15 DIAGNOSIS — D696 Thrombocytopenia, unspecified: Secondary | ICD-10-CM

## 2018-08-15 DIAGNOSIS — I10 Essential (primary) hypertension: Secondary | ICD-10-CM

## 2018-08-15 MED ORDER — FERROUS FUM-IRON POLYSACCH 162-115.2 MG PO CAPS: 1.0000 | ORAL_CAPSULE | Freq: Every day | ORAL | 2 refills | Status: DC

## 2018-08-15 NOTE — Progress Notes (Signed)
Virtual Visit Via telephone Note I connected with@ on 08/15/18 by telephone and verified that I am speaking with the correct person or authorized healthcare agent using two identifiers. [name] is currently located at home and there are no unauthorized people in close proximity. I completed this visit while in a private location in my home .  This visit type was conducted due to national recommendations for restrictions regarding the COVID-19 Pandemic (e.g. social distancing).  This format is felt to be most appropriate for this patient at this time.  All issues noted in this document were discussed and addressed.  No physical exam was performed.    I discussed the limitations, risks, security and privacy concerns of performing an evaluation and management service by telephone and the availability of in person appointments. I also discussed with the patient that there may be a patient responsible charge related to this service. The patient expressed understanding and agreed to proceed.   Date:  08/15/2018    ID:  [name]      10-23-1932        [id number]   Patient Care Team Patient Care Team: Ernestina PennaMoore, Milika Ventress W, MD as PCP - General (Family Medicine) Rollene RotundaHochrein, James, MD as Consulting Physician (Cardiology) Normajean Glasgowowlingson, John, MD (Pain Medicine)  Reason for Visit: Primary Care Follow-up     History of Present Illness & Review of Systems:     [name] is a 85 y.o. year old female primary care patient that presents today for a telehealth visit.  This visit was done on the telephone from my home location and the patient was identified properly by 2 or more methods.  The patient is pleasant and doing well.  She does get out and go places but wears personal protective equipment when doing this.  She has seen the cardiologist and sees him once a year.  This will come again in January 2021.  She has a history of atrial fibrillation.  She denies any chest pain or shortness of  breath.  She does have occasional heartburn but not too bad and she is having some increased constipation.  She tells me that she is taking MiraLAX and Colace once daily and we discussed with her about increasing the MiraLAX to see if it would help more with the constipation and continue to drink plenty of water and fluids.  She is passing her water well.  Her weight is up a little bit and she does have some slight edema and it is about 205.  She says she is wearing support hose.  She understands the importance of propping up her feet.  We will ask her to increase her Lasix and take a half of a pill on Monday Wednesday and Friday and a whole 1 every day to see if this can help control some of the edema and weight gain.  She does state she needs a refill on her tandem or iron pill.  She has lab work placed in the record for getting serum iron and blood work and hopefully she can get to the office and get this done in the next 3 to 4 weeks.  Review of systems as stated, otherwise negative.  The patient does not have symptoms concerning for COVID-19 infection (fever, chills, cough, or new shortness of breath).      Current Medications (Verified) Allergies as of 08/15/2018      Reactions   Aspirin Nausea Only   Codeine  Nausea Only   Sulfa Antibiotics    Vicodin [hydrocodone-acetaminophen] Nausea Only   Warfarin Sodium Other (See Comments)   Tingling all over   Cephalexin Rash   Red, rash covering lower limbs.      Medication List       Accurate as of Aug 15, 2018  8:17 AM. If you have any questions, ask your nurse or doctor.        albuterol 108 (90 Base) MCG/ACT inhaler Commonly known as:  VENTOLIN HFA Inhale 2 puffs into the lungs every 6 (six) hours as needed for wheezing or shortness of breath.   apixaban 5 MG Tabs tablet Commonly known as:  Eliquis Take 1 tablet (5 mg total) by mouth 2 (two) times daily.   calcium-vitamin D 500-200 MG-UNIT tablet Commonly known as:  OSCAL WITH  D Take 1 tablet by mouth daily.   Centrum Silver tablet Take 1 tablet by mouth daily.   ferrous fumarate-iron polysaccharide complex 162-115.2 MG Caps capsule Commonly known as:  TANDEM Take 1 capsule by mouth daily with breakfast.   furosemide 40 MG tablet Commonly known as:  LASIX Take 1 tablet (40 mg total) by mouth daily.   gabapentin 100 MG capsule Commonly known as:  NEURONTIN Take 1 capsule (100 mg total) by mouth 2 (two) times daily.   Klor-Con 10 10 MEQ tablet Generic drug:  potassium chloride Take 1 tablet (10 mEq total) by mouth daily.   levothyroxine 137 MCG tablet Commonly known as:  SYNTHROID Take 1 tablet (137 mcg total) by mouth daily. as directed   lisinopril 40 MG tablet Commonly known as:  ZESTRIL Take 1 tablet (40 mg total) by mouth daily.   LORazepam 0.5 MG tablet Commonly known as:  ATIVAN One half tablet to one daily if needed for anxiety   omeprazole 20 MG capsule Commonly known as:  PRILOSEC TAKE 1 CAPSULE BY MOUTH TWICE DAILY BEFORE A MEAL   polyethylene glycol powder 17 GM/SCOOP powder Commonly known as:  GLYCOLAX/MIRALAX DISSOLVE 17 GRAMS (ONE CAPFUL) OF POWDER IN WATER & DRINK ONCE DAILY AS NEEDED   Vitamin D3 50 MCG (2000 UT) Tabs Take 1 tablet by mouth daily.           Allergies (Verified)    Aspirin; Codeine; Sulfa antibiotics; Vicodin [hydrocodone-acetaminophen]; Warfarin sodium; and Cephalexin  Past Medical History Past Medical History:  Diagnosis Date   AF (atrial fibrillation) (HCC)    Arthritis    Diaphragmatic hernia without mention of obstruction or gangrene    Esophagitis    First degree atrioventricular block    Gastric polyp    GERD (gastroesophageal reflux disease)    Heme positive stool    Hyperlipidemia    Hypertension    Hypothyroid    Prolapse of vaginal walls without mention of uterine prolapse    Rectal polyp      Past Surgical History:  Procedure Laterality Date   DILATION AND  CURETTAGE OF UTERUS     KIDNEY STONE SURGERY      Social History   Socioeconomic History   Marital status: Divorced    Spouse name: Not on file   Number of children: 2   Years of education: 10th grade   Highest education level: 10th grade  Occupational History   Occupation: retired    Comment: [employer]  Ecologist strain: Not hard at all   Food insecurity:    Worry: Never true    Inability: Never true  Transportation needs:    Medical: No    Non-medical: No  Tobacco Use   Smoking status: Never Smoker   Smokeless tobacco: Never Used  Substance and Sexual Activity   Alcohol use: No   Drug use: No   Sexual activity: Not Currently  Lifestyle   Physical activity:    Days per week: 7 days    Minutes per session: 30 min   Stress: Not at all  Relationships   Social connections:    Talks on phone: More than three times a week    Gets together: Three times a week    Attends religious service: More than 4 times per year    Active member of club or organization: No    Attends meetings of clubs or organizations: Never    Relationship status: Divorced  Other Topics Concern   Not on file  Social History Narrative   Not on file     Family History  Problem Relation Age of Onset   Hypertension Mother    Aneurysm Mother        Brain   Hypertension Father    Kidney disease Father    Aneurysm Father        heart   Arthritis Daughter    Cancer Daughter        breast   Hyperlipidemia Sister       Labs/Other Tests and Data Reviewed:    Wt Readings from Last 3 Encounters:  06/16/18 203 lb (92.1 kg)  05/20/18 202 lb (91.6 kg)  05/08/18 205 lb (93 kg)   Temp Readings from Last 3 Encounters:  06/16/18 (!) 96.8 F (36 C) (Oral)  05/20/18 (!) 97.3 F (36.3 C) (Oral)  05/08/18 97.6 F (36.4 C) (Oral)   BP Readings from Last 3 Encounters:  06/16/18 (!) 141/70  05/20/18 (!) 138/58  05/08/18 136/62   Pulse Readings  from Last 3 Encounters:  06/16/18 60  05/20/18 63  05/08/18 66     Lab Results  Component Value Date   HGBA1C 6.2 06/16/2016   HGBA1C 6.0 10/26/2013   HGBA1C 6.0 06/23/2013   Lab Results  Component Value Date   LDLCALC 92 02/13/2018   CREATININE 1.43 (H) 04/07/2018       Chemistry      Component Value Date/Time   NA 141 04/07/2018 1441   NA 142 02/13/2018 1233   K 3.9 04/07/2018 1441   CL 105 04/07/2018 1441   CO2 28 04/07/2018 1441   BUN 25 (H) 04/07/2018 1441   BUN 25 02/13/2018 1233   CREATININE 1.43 (H) 04/07/2018 1441   CREATININE 0.92 10/01/2012 1207      Component Value Date/Time   CALCIUM 9.1 04/07/2018 1441   ALKPHOS 109 04/07/2018 1441   AST 23 04/07/2018 1441   ALT 12 04/07/2018 1441   BILITOT 0.7 04/07/2018 1441   BILITOT 0.6 02/13/2018 1233         OBSERVATIONS/ OBJECTIVE:     The patient was alert and responded appropriately to questions asked of her.  She did not seem to be winded or short of breath.  She is getting out despite the pandemic restrictions.  She is wearing protective equipment.  She does not have any blood pressure readings for me.  She says her weight is running about 205 pounds and she does have some slight edema.  Physical exam deferred due to nature of telephonic visit.  ASSESSMENT & PLAN    Time:   Today, I  have spent 21 minutes with the patient via telephone discussing the above including Covid precautions.     Visit Diagnoses: 1. Vitamin B 12 deficiency -Continue with B12 injections, one is due soon.  2. Thoracic aorta atherosclerosis (HCC) -Continue with aggressive therapeutic lifestyle changes with diet and exercise to achieve weight loss  3. Paroxysmal atrial fibrillation (HCC) -No complaints today with palpitations.  She does complain of some edema and weight gain but she thinks that she needs to do better with watching her diet.  She also elevate her legs and continue with support hose.  4. Hypothyroidism due  to acquired atrophy of thyroid -Continue with current treatment pending results of lab work  5. Gastroesophageal reflux disease, esophagitis presence not specified -Continue with omeprazole twice daily before breakfast and supper and watch diet more closely  6. Morbid obesity (HCC) -Continue to make every effort to lose weight through diet and exercise  7. Thrombocytopenia (HCC) -No history of any obvious bleeding or increased bruising.  8. Vitamin D deficiency -Continue with vitamin D replacement pending results of lab work  9. Essential hypertension -Check blood pressure readings more frequently so that these can be better monitored if necessary with tele-visits.  10. Other iron deficiency anemia -Get CBC and appropriate lab work and refill tandem  Patient Instructions  Continue current treatment and as aggressive therapeutic lifestyle changes as possible including diet and exercise Continue to be careful and not put self at risk for falling Drink plenty of water and fluids Stay active physically Practice good hand and respiratory hygiene in the midst of this corona virus pandemic Please come to the office at your convenience and get fasting blood work and wear personal protective equipment at that time     The above assessment and management plan was discussed with the patient. The patient verbalized understanding of and has agreed to the management plan. Patient is aware to call the clinic if symptoms persist or worsen. Patient is aware when to return to the clinic for a follow-up visit. Patient educated on when it is appropriate to go to the emergency department.    Ernestina Penna, MD [facility] Medicine [address] [place], [place], [place] [address] Ph [phone]   Nyra Capes MD

## 2018-08-15 NOTE — Addendum Note (Signed)
Addended by: Magdalene River on: 08/15/2018 02:30 PM   Modules accepted: Orders

## 2018-08-15 NOTE — Patient Instructions (Addendum)
Continue current treatment and as aggressive therapeutic lifestyle changes as possible including diet and exercise Continue to be careful and not put self at risk for falling Drink plenty of water and fluids Stay active physically Practice good hand and respiratory hygiene in the midst of this corona virus pandemic Please come to the office at your convenience and get fasting blood work and wear personal protective equipment at that time

## 2018-08-18 ENCOUNTER — Other Ambulatory Visit: Payer: Self-pay

## 2018-08-18 ENCOUNTER — Telehealth: Payer: Self-pay | Admitting: Family Medicine

## 2018-08-19 ENCOUNTER — Ambulatory Visit (INDEPENDENT_AMBULATORY_CARE_PROVIDER_SITE_OTHER): Payer: Medicare Other | Admitting: *Deleted

## 2018-08-19 DIAGNOSIS — E538 Deficiency of other specified B group vitamins: Secondary | ICD-10-CM

## 2018-08-19 NOTE — Progress Notes (Signed)
Pt given Cyanocobalamin inj Tolerated well 

## 2018-08-26 ENCOUNTER — Other Ambulatory Visit: Payer: Self-pay

## 2018-08-26 ENCOUNTER — Ambulatory Visit (INDEPENDENT_AMBULATORY_CARE_PROVIDER_SITE_OTHER): Payer: Medicare Other | Admitting: Family Medicine

## 2018-08-26 ENCOUNTER — Encounter: Payer: Self-pay | Admitting: Family Medicine

## 2018-08-26 DIAGNOSIS — L03011 Cellulitis of right finger: Secondary | ICD-10-CM | POA: Diagnosis not present

## 2018-08-26 MED ORDER — DOXYCYCLINE HYCLATE 100 MG PO TABS: 100.0000 mg | ORAL_TABLET | Freq: Two times a day (BID) | ORAL | 0 refills | Status: DC

## 2018-08-26 NOTE — Progress Notes (Signed)
Virtual Visit via telephone Note  I connected with [name] on 08/26/18 at 1145 by telephone and verified that I am speaking with the correct person using two identifiers. [name] is currently located at home and no other people are currently with her during visit. The provider, Elige RadonJoshua A , MD is located in their office at time of visit.  Call ended at 1152  I discussed the limitations, risks, security and privacy concerns of performing an evaluation and management service by telephone and the availability of in person appointments. I also discussed with the patient that there may be a patient responsible charge related to this service. The patient expressed understanding and agreed to proceed.   History and Present Illness: Patient has been complaining of a possible infection in her finger.  She thinks she may have pricked her finger on a thorn and started 1 week ago and now it is swelling more and more red and warm. It is her 5th finger on her right hand. She denies any pus or drainage.  She feels like the swelling is going into her hand.  She says she has had this happen a couple times before where she is needed an antibiotic for it.  No diagnosis found.  Outpatient Encounter Medications as of 08/26/2018  Medication Sig  . albuterol (PROVENTIL HFA;VENTOLIN HFA) 108 (90 Base) MCG/ACT inhaler Inhale 2 puffs into the lungs every 6 (six) hours as needed for wheezing or shortness of breath.  Marland Kitchen. apixaban (ELIQUIS) 5 MG TABS tablet Take 1 tablet (5 mg total) by mouth 2 (two) times daily.  . calcium-vitamin D (OSCAL WITH D) 500-200 MG-UNIT per tablet Take 1 tablet by mouth daily.  . Cholecalciferol (VITAMIN D3) 2000 UNITS TABS Take 1 tablet by mouth daily.    . ferrous fumarate-iron polysaccharide complex (TANDEM) 162-115.2 MG CAPS capsule Take 1 capsule by mouth daily with breakfast.  . furosemide (LASIX) 40 MG tablet Take 1 tablet (40 mg total) by mouth daily.  Marland Kitchen. gabapentin  (NEURONTIN) 100 MG capsule Take 1 capsule (100 mg total) by mouth 2 (two) times daily.  Marland Kitchen. KLOR-CON 10 10 MEQ tablet Take 1 tablet (10 mEq total) by mouth daily.  Marland Kitchen. levothyroxine (SYNTHROID, LEVOTHROID) 137 MCG tablet Take 1 tablet (137 mcg total) by mouth daily. as directed  . lisinopril (PRINIVIL,ZESTRIL) 40 MG tablet Take 1 tablet (40 mg total) by mouth daily.  Marland Kitchen. LORazepam (ATIVAN) 0.5 MG tablet One half tablet to one daily if needed for anxiety  . Multiple Vitamins-Minerals (CENTRUM SILVER) tablet Take 1 tablet by mouth daily.   Marland Kitchen. omeprazole (PRILOSEC) 20 MG capsule TAKE 1 CAPSULE BY MOUTH TWICE DAILY BEFORE A MEAL  . polyethylene glycol powder (GLYCOLAX/MIRALAX) powder DISSOLVE 17 GRAMS (ONE CAPFUL) OF POWDER IN WATER & DRINK ONCE DAILY AS NEEDED   Facility-Administered Encounter Medications as of 08/26/2018  Medication  . cyanocobalamin ((VITAMIN B-12)) injection 1,000 mcg    Review of Systems  Constitutional: Negative for chills and fever.  Skin: Positive for color change. Negative for rash and wound.  All other systems reviewed and are negative.   Observations/Objective: Patient sounds comfortable on the phone and in no acute distress  Assessment and Plan: Problem List Items Addressed This Visit    None    Visit Diagnoses    Cellulitis of finger of right hand    -  Primary   Relevant Medications   doxycycline (VIBRA-TABS) 100 MG tablet  Follow Up Instructions: Follow-up as needed, if it worsens or swelling progresses or if she loses sensation in the tip of her finger.    I discussed the assessment and treatment plan with the patient. The patient was provided an opportunity to ask questions and all were answered. The patient agreed with the plan and demonstrated an understanding of the instructions.   The patient was advised to call back or seek an in-person evaluation if the symptoms worsen or if the condition fails to improve as anticipated.  The above assessment  and management plan was discussed with the patient. The patient verbalized understanding of and has agreed to the management plan. Patient is aware to call the clinic if symptoms persist or worsen. Patient is aware when to return to the clinic for a follow-up visit. Patient educated on when it is appropriate to go to the emergency department.    I provided 7 minutes of non-face-to-face time during this encounter.    Nils Pyle, MD

## 2018-09-01 ENCOUNTER — Other Ambulatory Visit: Payer: Self-pay

## 2018-09-02 ENCOUNTER — Encounter: Payer: Self-pay | Admitting: Nurse Practitioner

## 2018-09-02 ENCOUNTER — Ambulatory Visit (INDEPENDENT_AMBULATORY_CARE_PROVIDER_SITE_OTHER): Payer: Medicare Other | Admitting: Nurse Practitioner

## 2018-09-02 VITALS — BP 136/61 | HR 80 | Temp 97.3°F | Ht 64.0 in | Wt 203.8 lb

## 2018-09-02 DIAGNOSIS — L03011 Cellulitis of right finger: Secondary | ICD-10-CM | POA: Diagnosis not present

## 2018-09-02 DIAGNOSIS — M0609 Rheumatoid arthritis without rheumatoid factor, multiple sites: Secondary | ICD-10-CM | POA: Diagnosis not present

## 2018-09-02 MED ORDER — PREDNISONE 20 MG PO TABS: ORAL_TABLET | ORAL | 0 refills | Status: DC

## 2018-09-02 MED ORDER — CIPROFLOXACIN HCL 500 MG PO TABS: 500.0000 mg | ORAL_TABLET | Freq: Two times a day (BID) | ORAL | 0 refills | Status: DC

## 2018-09-02 NOTE — Patient Instructions (Signed)

## 2018-09-02 NOTE — Progress Notes (Signed)
   Subjective:    Patient ID: [name], female    DOB: 01-28-1933, 85 y.o.   MRN: [id number]   Chief Complaint: Edema (right hand - middle, ring and pinky x 2 weeks)   HPI Patient come sin today c/o swelling and redness of right middle finger. Thinks she may have got bitten by something. Says it is throbbing in pain. Rates pain 8/10. Dr. Louanne Skye called in doxycycline for her but that made her sick so she had to stop taking.    Review of Systems  Constitutional: Negative.   Respiratory: Negative.   Cardiovascular: Negative.   Gastrointestinal: Negative.   Skin: Negative.   Neurological: Negative.   Psychiatric/Behavioral: Negative.   All other systems reviewed and are negative.      Objective:   Physical Exam Vitals signs and nursing note reviewed.  Constitutional:      Appearance: Normal appearance.  Cardiovascular:     Rate and Rhythm: Normal rate and regular rhythm.     Heart sounds: Normal heart sounds.  Pulmonary:     Breath sounds: Normal breath sounds.  Skin:    General: Skin is warm and dry.     Comments: Right ring finger edema at first PIP joint. Hot to touch. Unable to bend dur to edema.  Neurological:     General: No focal deficit present.     Mental Status: She is alert and oriented to person, place, and time.  Psychiatric:        Mood and Affect: Mood normal.        Behavior: Behavior normal.    BP 136/61   Pulse 80   Temp (!) 97.3 F (36.3 C) (Oral)   Ht 5\' 4"  (1.626 m)   Wt 203 lb 12.8 oz (92.4 kg)   BMI 34.98 kg/m           Assessment & Plan:  [name] in today with chief complaint of Edema (right hand - middle, ring and pinky x 2 weeks)   1. Cellulitis of finger of right hand Soak in epsom salt BID - ciprofloxacin (CIPRO) 500 MG tablet; Take 1 tablet (500 mg total) by mouth 2 (two) times daily.  Dispense: 10 tablet; Refill: 0  2. Rheumatoid arthritis of multiple sites with negative rheumatoid factor (HCC) exercsie  fingers - predniSONE (DELTASONE) 20 MG tablet; 2 po at sametime daily for 5 days  Dispense: 10 tablet; Refill: 0  RTO prn  Mary-Margaret Daphine Deutscher, FNP

## 2018-09-12 ENCOUNTER — Telehealth: Payer: Self-pay | Admitting: Family Medicine

## 2018-09-12 NOTE — Telephone Encounter (Signed)
Patient states that [employer] is making her nanous and she would like something else sent to pharmacy. Please advise and send to pools

## 2018-09-12 NOTE — Telephone Encounter (Signed)
Please talk with patient's pharmacist and see if they can recommend a different iron formulation that may be less likely to cause nausea

## 2018-09-12 NOTE — Telephone Encounter (Signed)
Pharmacy states there is nothing else that will not make patient nauseous. Advised to take on full stomach - Patient aware

## 2018-09-18 ENCOUNTER — Telehealth: Payer: Self-pay | Admitting: Neurology

## 2018-09-18 ENCOUNTER — Other Ambulatory Visit: Payer: Self-pay

## 2018-09-18 NOTE — Telephone Encounter (Signed)
Pt called to cancel appt [name] 10/13/18 pt states she will not be calling back to reschedule. No reason given.

## 2018-09-19 ENCOUNTER — Ambulatory Visit (INDEPENDENT_AMBULATORY_CARE_PROVIDER_SITE_OTHER): Payer: Medicare Other

## 2018-09-19 DIAGNOSIS — I700 Atherosclerosis of aorta: Secondary | ICD-10-CM

## 2018-09-19 DIAGNOSIS — E559 Vitamin D deficiency, unspecified: Secondary | ICD-10-CM

## 2018-09-19 DIAGNOSIS — I10 Essential (primary) hypertension: Secondary | ICD-10-CM

## 2018-09-19 DIAGNOSIS — E538 Deficiency of other specified B group vitamins: Secondary | ICD-10-CM

## 2018-09-19 NOTE — Addendum Note (Signed)
Addended by: Liliane Bade on: 09/19/2018 09:17 AM   Modules accepted: Orders

## 2018-09-20 LAB — LIPID PANEL
Chol/HDL Ratio: 2.1 ratio (ref 0.0–4.4)
Cholesterol, Total: 150 mg/dL (ref 100–199)
HDL: 73 mg/dL (ref >39)
LDL Calculated: 61 mg/dL (ref 0–99)
Triglycerides: 79 mg/dL (ref 0–149)
VLDL Cholesterol Cal: 16 mg/dL (ref 5–40)

## 2018-09-20 LAB — VITAMIN D 25 HYDROXY (VIT D DEFICIENCY, FRACTURES): Vit D, 25-Hydroxy: 48.9 ng/mL (ref 30.0–100.0)

## 2018-10-07 DIAGNOSIS — B351 Tinea unguium: Secondary | ICD-10-CM | POA: Diagnosis not present

## 2018-10-07 DIAGNOSIS — L84 Corns and callosities: Secondary | ICD-10-CM | POA: Diagnosis not present

## 2018-10-07 DIAGNOSIS — M79676 Pain in unspecified toe(s): Secondary | ICD-10-CM | POA: Diagnosis not present

## 2018-10-07 DIAGNOSIS — I70203 Unspecified atherosclerosis of native arteries of extremities, bilateral legs: Secondary | ICD-10-CM | POA: Diagnosis not present

## 2018-10-13 ENCOUNTER — Ambulatory Visit: Payer: Self-pay | Admitting: Neurology

## 2018-10-17 ENCOUNTER — Encounter: Payer: Self-pay | Admitting: *Deleted

## 2018-10-20 ENCOUNTER — Other Ambulatory Visit: Payer: Self-pay

## 2018-10-20 ENCOUNTER — Ambulatory Visit (INDEPENDENT_AMBULATORY_CARE_PROVIDER_SITE_OTHER): Payer: Medicare Other | Admitting: *Deleted

## 2018-10-20 DIAGNOSIS — E538 Deficiency of other specified B group vitamins: Secondary | ICD-10-CM

## 2018-10-20 NOTE — Progress Notes (Signed)
Pt given Cyanocobalamin inj Tolerated well 

## 2018-10-30 ENCOUNTER — Other Ambulatory Visit: Payer: Self-pay | Admitting: *Deleted

## 2018-10-30 DIAGNOSIS — I10 Essential (primary) hypertension: Secondary | ICD-10-CM

## 2018-10-30 MED ORDER — FUROSEMIDE 40 MG PO TABS: 40.0000 mg | ORAL_TABLET | Freq: Every day | ORAL | 0 refills | Status: DC

## 2018-10-30 MED ORDER — LISINOPRIL 40 MG PO TABS: 40.0000 mg | ORAL_TABLET | Freq: Every day | ORAL | 0 refills | Status: DC

## 2018-11-04 ENCOUNTER — Other Ambulatory Visit: Payer: Self-pay | Admitting: Cardiology

## 2018-11-04 NOTE — Telephone Encounter (Signed)
35f 92.4kg Scr 1.43 (04/07/18) Lovw/hochrein (04/09/18)

## 2018-11-06 ENCOUNTER — Other Ambulatory Visit: Payer: Self-pay

## 2018-11-07 ENCOUNTER — Ambulatory Visit (INDEPENDENT_AMBULATORY_CARE_PROVIDER_SITE_OTHER): Payer: Medicare Other | Admitting: Family Medicine

## 2018-11-07 ENCOUNTER — Encounter: Payer: Self-pay | Admitting: Family Medicine

## 2018-11-07 ENCOUNTER — Ambulatory Visit: Payer: Medicare Other | Admitting: Family Medicine

## 2018-11-07 VITALS — BP 139/64 | HR 58 | Temp 97.8°F | Ht 64.0 in | Wt 207.0 lb

## 2018-11-07 DIAGNOSIS — F418 Other specified anxiety disorders: Secondary | ICD-10-CM

## 2018-11-07 DIAGNOSIS — N183 Chronic kidney disease, stage 3 unspecified: Secondary | ICD-10-CM

## 2018-11-07 DIAGNOSIS — Z7901 Long term (current) use of anticoagulants: Secondary | ICD-10-CM | POA: Diagnosis not present

## 2018-11-07 DIAGNOSIS — I480 Paroxysmal atrial fibrillation: Secondary | ICD-10-CM | POA: Diagnosis not present

## 2018-11-07 DIAGNOSIS — E034 Atrophy of thyroid (acquired): Secondary | ICD-10-CM

## 2018-11-07 DIAGNOSIS — Z79899 Other long term (current) drug therapy: Secondary | ICD-10-CM | POA: Diagnosis not present

## 2018-11-07 DIAGNOSIS — F411 Generalized anxiety disorder: Secondary | ICD-10-CM | POA: Insufficient documentation

## 2018-11-07 MED ORDER — POLYSACCHARIDE IRON COMPLEX 150 MG PO CAPS: 150.0000 mg | ORAL_CAPSULE | Freq: Every day | ORAL | 5 refills | Status: DC

## 2018-11-07 MED ORDER — LORAZEPAM 0.5 MG PO TABS: ORAL_TABLET | ORAL | 1 refills | Status: DC

## 2018-11-07 NOTE — Patient Instructions (Addendum)
You had labs performed today.  You will be contacted with the results of the labs once they are available, usually in the next 3 business days for routine lab work.  If you have an active my chart account, they will be released to your MyChart.  If you prefer to have these labs released to you via telephone, please let us know.  If you had a pap smear or biopsy performed, expect to be contacted in about 7-10 days.  Controlled Substance Guidelines:  1. You cannot get an early refill, even it is lost.  2. You cannot get controlled medications from any other doctor, unless it is the emergency department and related to a new problem or injury.  3. You cannot use alcohol, marijuana, cocaine or any other recreational drugs while using this medication. This is very dangerous.  4. You are willing to have your urine drug tested at each visit.  5. You will not drive while using this medication, because that can put yourself and others in serious danger of an accident. 6. If any medication is stolen, then there must be a police report to verify it, or it cannot be refilled.  7. I will not prescribe these medications for longer than 3 months.  8. You must bring your pill bottle to each visit.  9. You must use the same pharmacy for all refills for the medication, unless you clear it with me beforehand.  10. You cannot share or sell this medication.    

## 2018-11-07 NOTE — Progress Notes (Signed)
Subjective: CC: Panic disorder PCP: Raliegh IpGottschalk, Sundi Slevin M, DO RUE:[id number]:[name] is a 85 y.o. female presenting to clinic today for:  1. Panic Patient reports intermittent difficulties with panic and situational anxiety.  She uses very seldom lorazepam.  She notes that typically this is half a tablet perhaps 1-2 times per month.  She has not had a refill since January.  Denies excessive sedation, falls, confusion.  No difficulty breathing.  2.  Thyroid disorder Patient reports compliance with thyroid medication.  Denies any heart palpitations, difficulty swallowing, change in voice.  3.  Hypertension/CKD 3 Patient reports compliance with lisinopril.  Denies any chest pain, shortness of breath.  She reports chronic LE edema.  She wears compression hose.   ROS: Per HPI  Allergies  Allergen Reactions  . Aspirin Nausea Only  . Codeine Nausea Only  . Sulfa Antibiotics   . Vicodin [Hydrocodone-Acetaminophen] Nausea Only  . Warfarin Sodium Other (See Comments)    Tingling all over  . Cephalexin Rash    Red, rash covering lower limbs.   Past Medical History:  Diagnosis Date  . AF (atrial fibrillation) (HCC)   . Arthritis   . Diaphragmatic hernia without mention of obstruction or gangrene   . Esophagitis   . First degree atrioventricular block   . Gastric polyp   . GERD (gastroesophageal reflux disease)   . Heme positive stool   . Hyperlipidemia   . Hypertension   . Hypothyroid   . Prolapse of vaginal walls without mention of uterine prolapse   . Rectal polyp     Current Outpatient Medications:  .  calcium-vitamin D (OSCAL WITH D) 500-200 MG-UNIT per tablet, Take 1 tablet by mouth daily., Disp: , Rfl:  .  Cholecalciferol (VITAMIN D3) 2000 UNITS TABS, Take 1 tablet by mouth daily.  , Disp: , Rfl:  .  ELIQUIS 5 MG TABS tablet, TAKE  (1)  TABLET TWICE A DAY., Disp: 180 tablet, Rfl: 1 .  ferrous fumarate-iron polysaccharide complex (TANDEM) 162-115.2 MG CAPS capsule, Take 1  capsule by mouth daily with breakfast., Disp: 30 capsule, Rfl: 2 .  furosemide (LASIX) 40 MG tablet, Take 1 tablet (40 mg total) by mouth daily., Disp: 90 tablet, Rfl: 0 .  gabapentin (NEURONTIN) 100 MG capsule, Take 1 capsule (100 mg total) by mouth 2 (two) times daily., Disp: 180 capsule, Rfl: 0 .  KLOR-CON 10 10 MEQ tablet, Take 1 tablet (10 mEq total) by mouth daily., Disp: 90 tablet, Rfl: 1 .  levothyroxine (SYNTHROID, LEVOTHROID) 137 MCG tablet, Take 1 tablet (137 mcg total) by mouth daily. as directed, Disp: 90 tablet, Rfl: 1 .  lisinopril (ZESTRIL) 40 MG tablet, Take 1 tablet (40 mg total) by mouth daily., Disp: 90 tablet, Rfl: 0 .  LORazepam (ATIVAN) 0.5 MG tablet, One half tablet to one daily if needed for anxiety, Disp: 30 tablet, Rfl: 5 .  Multiple Vitamins-Minerals (CENTRUM SILVER) tablet, Take 1 tablet by mouth daily. , Disp: , Rfl:  .  omeprazole (PRILOSEC) 20 MG capsule, TAKE 1 CAPSULE BY MOUTH TWICE DAILY BEFORE A MEAL, Disp: 180 capsule, Rfl: 0 .  polyethylene glycol powder (GLYCOLAX/MIRALAX) powder, DISSOLVE 17 GRAMS (ONE CAPFUL) OF POWDER IN WATER & DRINK ONCE DAILY AS NEEDED, Disp: 850 g, Rfl: 1 .  albuterol (PROVENTIL HFA;VENTOLIN HFA) 108 (90 Base) MCG/ACT inhaler, Inhale 2 puffs into the lungs every 6 (six) hours as needed for wheezing or shortness of breath. (Patient not taking: Reported on 11/07/2018), Disp: 8 g,  Rfl: 1  Current Facility-Administered Medications:  .  cyanocobalamin ((VITAMIN B-12)) injection 1,000 mcg, 1,000 mcg, Intramuscular, Q30 days, Chipper Herb, MD, 1,000 mcg at 10/20/18 2409 Social History   Socioeconomic History  . Marital status: Divorced    Spouse name: Not on file  . Number of children: 2  . Years of education: 10th grade  . Highest education level: 10th grade  Occupational History  . Occupation: retired    Comment: [employer]  Social Needs  . Financial resource strain: Not hard at all  . Food insecurity    Worry: Never true     Inability: Never true  . Transportation needs    Medical: No    Non-medical: No  Tobacco Use  . Smoking status: Never Smoker  . Smokeless tobacco: Never Used  Substance and Sexual Activity  . Alcohol use: No  . Drug use: No  . Sexual activity: Not Currently  Lifestyle  . Physical activity    Days per week: 7 days    Minutes per session: 30 min  . Stress: Not at all  Relationships  . Social connections    Talks on phone: More than three times a week    Gets together: Three times a week    Attends religious service: More than 4 times per year    Active member of club or organization: No    Attends meetings of clubs or organizations: Never    Relationship status: Divorced  . Intimate partner violence    Fear of current or ex partner: No    Emotionally abused: No    Physically abused: No    Forced sexual activity: No  Other Topics Concern  . Not on file  Social History Narrative  . Not on file   Family History  Problem Relation Age of Onset  . Hypertension Mother   . Aneurysm Mother        Brain  . Hypertension Father   . Kidney disease Father   . Aneurysm Father        heart  . Arthritis Daughter   . Cancer Daughter        breast  . Hyperlipidemia Sister     Objective: Office vital signs reviewed. BP 139/64   Pulse (!) 58   Temp 97.8 F (36.6 C) (Oral)   Ht 5\' 4"  (1.626 m)   Wt 207 lb (93.9 kg)   BMI 35.53 kg/m   Physical Examination:  General: Awake, alert, well nourished, No acute distress HEENT: Normal, sclera white.  No exophthalmos.  No goiter Cardio: seemingly regular rate and rhythm, S1S2 heard, 2/6 SEM appreciated Pulm: clear to auscultation bilaterally, no wheezes, rhonchi or rales; normal work of breathing on room air Extremities: warm, well perfused, 2+pitting edema to mid shin. No cyanosis or clubbing; +2 pulses bilaterally Skin: normal temperature.  She has chronic venous stasis changes along bilateral lower extremities. Psych: Mood stable,  speech normal, affect appropriate, pleasant and interactive  Depression screen [facility] 2/9 11/07/2018 11/07/2018 09/02/2018  Decreased Interest 1 2 0  Down, Depressed, Hopeless 2 1 0  PHQ - 2 Score 3 3 0  Altered sleeping 1 2 -  Tired, decreased energy 2 1 -  Change in appetite 0 0 -  Feeling bad or failure about yourself  0 1 -  Trouble concentrating 1 1 -  Moving slowly or fidgety/restless 0 0 -  Suicidal thoughts 0 0 -  PHQ-9 Score 7 8 -  Difficult doing work/chores  Somewhat difficult - -  Some recent data might be hidden   GAD 7 : Generalized Anxiety Score 11/07/2018  Nervous, Anxious, on Edge 2  Control/stop worrying 2  Worry too much - different things 1  Trouble relaxing 1  Restless 0  Easily annoyed or irritable 0  Afraid - awful might happen 1  Total GAD 7 Score 7  Anxiety Difficulty Somewhat difficult   Assessment/ Plan: 85 y.o. female    1. Situational anxiety Controlled with PRN use of Ativan.  I think that she is using this extremely responsibly and sparingly.  No red flag signs or symptoms.  The national cardiac database was reviewed and there were no red flags.  Last refill was January.  This is been renewed.  She will follow-up PRN - ToxASSURE Select 13 (MW), Urine - LORazepam (ATIVAN) 0.5 MG tablet; One half tablet to one daily if needed for anxiety  Dispense: 30 tablet; Refill: 1  2. Controlled substance agreement signed - ToxASSURE Select 13 (MW), Urine  3. Hypothyroidism due to acquired atrophy of thyroid - Thyroid Panel With TSH  4. Stage 3 chronic kidney disease (HCC) - Basic Metabolic Panel  5. Paroxysmal atrial fibrillation (HCC)  6. Chronic anticoagulation    Orders Placed This Encounter  Procedures  . ToxASSURE Select 13 (MW), Urine  . Thyroid Panel With TSH  . Basic Metabolic Panel  . CBC   Meds ordered this encounter  Medications  . iron polysaccharides (FERREX 150) 150 MG capsule    Sig: Take 1 capsule (150 mg total) by mouth daily.     Dispense:  30 capsule    Refill:  5  . LORazepam (ATIVAN) 0.5 MG tablet    Sig: One half tablet to one daily if needed for anxiety    Dispense:  30 tablet    Refill:  1     [name] Lattin Hulen SkainsM Sarinah Doetsch, DO [facility] [phone]

## 2018-11-08 LAB — BASIC METABOLIC PANEL
BUN/Creatinine Ratio: 14 (ref 12–28)
BUN: 19 mg/dL (ref 8–27)
CO2: 28 mmol/L (ref 20–29)
Calcium: 9.7 mg/dL (ref 8.7–10.3)
Chloride: 96 mmol/L (ref 96–106)
Creatinine, Ser: 1.32 mg/dL — ABNORMAL HIGH (ref 0.57–1.00)
GFR calc Af Amer: 43 mL/min/{1.73_m2} — ABNORMAL LOW (ref >59)
GFR calc non Af Amer: 37 mL/min/{1.73_m2} — ABNORMAL LOW (ref >59)
Glucose: 89 mg/dL (ref 65–99)
Potassium: 4.3 mmol/L (ref 3.5–5.2)
Sodium: 141 mmol/L (ref 134–144)

## 2018-11-08 LAB — CBC
Hematocrit: 31 % — ABNORMAL LOW (ref 34.0–46.6)
Hemoglobin: 10.4 g/dL — ABNORMAL LOW (ref 11.1–15.9)
MCH: 31.2 pg (ref 26.6–33.0)
MCHC: 33.5 g/dL (ref 31.5–35.7)
MCV: 93 fL (ref 79–97)
Platelets: 58 10*3/uL — CL (ref 150–450)
RBC: 3.33 x10E6/uL — ABNORMAL LOW (ref 3.77–5.28)
RDW: 12.7 % (ref 11.7–15.4)
WBC: 3.4 10*3/uL (ref 3.4–10.8)

## 2018-11-08 LAB — THYROID PANEL WITH TSH
Free Thyroxine Index: 2.8 (ref 1.2–4.9)
T3 Uptake Ratio: 30 % (ref 24–39)
T4, Total: 9.4 ug/dL (ref 4.5–12.0)
TSH: 1.45 u[IU]/mL (ref 0.450–4.500)

## 2018-11-10 ENCOUNTER — Telehealth: Payer: Self-pay | Admitting: Family Medicine

## 2018-11-10 ENCOUNTER — Other Ambulatory Visit: Payer: Self-pay | Admitting: Family Medicine

## 2018-11-10 MED ORDER — LEVOTHYROXINE SODIUM 137 MCG PO TABS: 137.0000 ug | ORAL_TABLET | Freq: Every day | ORAL | 1 refills | Status: AC

## 2018-11-10 NOTE — Telephone Encounter (Signed)
Patient aware of results and recommendations. °

## 2018-11-11 LAB — TOXASSURE SELECT 13 (MW), URINE

## 2018-11-18 ENCOUNTER — Other Ambulatory Visit: Payer: Self-pay | Admitting: Family Medicine

## 2018-11-18 DIAGNOSIS — K2100 Gastro-esophageal reflux disease with esophagitis, without bleeding: Secondary | ICD-10-CM

## 2018-11-19 ENCOUNTER — Other Ambulatory Visit: Payer: Self-pay

## 2018-11-20 ENCOUNTER — Ambulatory Visit (INDEPENDENT_AMBULATORY_CARE_PROVIDER_SITE_OTHER): Payer: Medicare Other | Admitting: *Deleted

## 2018-11-20 ENCOUNTER — Encounter: Payer: Self-pay | Admitting: *Deleted

## 2018-11-20 DIAGNOSIS — E538 Deficiency of other specified B group vitamins: Secondary | ICD-10-CM

## 2018-11-20 NOTE — Progress Notes (Signed)
Pt given Cyanocobalamin inj Tolerated well 

## 2018-11-25 ENCOUNTER — Ambulatory Visit (INDEPENDENT_AMBULATORY_CARE_PROVIDER_SITE_OTHER): Payer: Medicare Other | Admitting: Family Medicine

## 2018-11-25 DIAGNOSIS — L03012 Cellulitis of left finger: Secondary | ICD-10-CM | POA: Diagnosis not present

## 2018-11-25 MED ORDER — DOXYCYCLINE HYCLATE 100 MG PO TABS: 100.0000 mg | ORAL_TABLET | Freq: Two times a day (BID) | ORAL | 0 refills | Status: AC

## 2018-11-25 NOTE — Patient Instructions (Signed)
Paronychia Paronychia is an infection of the skin. It happens near a fingernail or toenail. It may cause pain and swelling around the nail. In some cases, a fluid-filled bump (abscess) can form near or under the nail. Usually, this condition is not serious, and it clears up with treatment. Follow these instructions at home: Wound care  Keep the affected area clean.  Soak the fingers or toes in warm water as told by your doctor. You may be told to do this for 20 minutes, 2-3 times a day.  Keep the area dry when you are not soaking it.  Do not try to drain a fluid-filled bump on your own.  Follow instructions from your doctor about how to take care of the affected area. Make sure you: ? Wash your hands with soap and water before you change your bandage (dressing). If you cannot use soap and water, use hand sanitizer. ? Change your bandage as told by your doctor.  If you had a fluid-filled bump and your doctor drained it, check the area every day for signs of infection. Check for: ? Redness, swelling, or pain. ? Fluid or blood. ? Warmth. ? Pus or a bad smell. Medicines   Take over-the-counter and prescription medicines only as told by your doctor.  If you were prescribed an antibiotic medicine, take it as told by your doctor. Do not stop taking it even if you start to feel better. General instructions  Avoid touching any chemicals.  Do not pick at the affected area. Prevention  To prevent this condition from happening again: ? Wear rubber gloves when putting your hands in water for washing dishes or other tasks. ? Wear gloves if your hands might touch cleaners or chemicals. ? Avoid injuring your nails or fingertips. ? Do not bite your nails or tear hangnails. ? Do not cut your nails very short. ? Do not cut the skin at the base and sides of the nail (cuticles). ? Use clean nail clippers or scissors when trimming nails. Contact a doctor if:  You feel worse.  You do not get  better.  You have more fluid, blood, or pus coming from the affected area.  Your finger or knuckle is swollen or is hard to move. Get help right away if you have:  A fever or chills.  Redness spreading from the affected area.  Pain in a joint or muscle. Summary  Paronychia is an infection of the skin. It happens near a fingernail or toenail.  This condition may cause pain and swelling around the nail.  Soak the fingers or toes in warm water as told by your doctor.  Usually, this condition is not serious, and it clears up with treatment. This information is not intended to replace advice given to you by your health care provider. Make sure you discuss any questions you have with your health care provider. Document Released: 03/07/2009 Document Revised: 04/05/2017 Document Reviewed: 04/01/2017 Elsevier Patient Education  2020 Elsevier Inc.  

## 2018-11-25 NOTE — Progress Notes (Signed)
Telephone visit  Subjective: CC: finger infection PCP: Raliegh IpGottschalk,  M, DO ZOX:[id number]:[name] is a 85 y.o. female calls for telephone consult today. Patient provides verbal consent for consult held via phone.  Location of patient: home Location of provider: Working remotely from home Others present for call: none  1.  Finger infection Patient notes that a couple of days ago she was reaching in between her seats of the car and accidentally cut herself on a piece of glass.  She notes that it bled for a little bit but it resolved.  Over the last 24 hours she is noticed that the area along her cuticle has gotten red and hot.  She states it is sore to touch but denies any drainage.  She is able to move the finger without difficulty.  Denies any joint pain, redness or swelling.  No fevers.   ROS: Per HPI  Allergies  Allergen Reactions  . Aspirin Nausea Only  . Codeine Nausea Only  . Sulfa Antibiotics   . Vicodin [Hydrocodone-Acetaminophen] Nausea Only  . Warfarin Sodium Other (See Comments)    Tingling all over  . Cephalexin Rash    Red, rash covering lower limbs.   Past Medical History:  Diagnosis Date  . AF (atrial fibrillation) (HCC)   . Arthritis   . Diaphragmatic hernia without mention of obstruction or gangrene   . Esophagitis   . First degree atrioventricular block   . Gastric polyp   . GERD (gastroesophageal reflux disease)   . Heme positive stool   . Hyperlipidemia   . Hypertension   . Hypothyroid   . Prolapse of vaginal walls without mention of uterine prolapse   . Rectal polyp     Current Outpatient Medications:  .  albuterol (PROVENTIL HFA;VENTOLIN HFA) 108 (90 Base) MCG/ACT inhaler, Inhale 2 puffs into the lungs every 6 (six) hours as needed for wheezing or shortness of breath. (Patient not taking: Reported on 11/07/2018), Disp: 8 g, Rfl: 1 .  calcium-vitamin D (OSCAL WITH D) 500-200 MG-UNIT per tablet, Take 1 tablet by mouth daily., Disp: , Rfl:  .   Cholecalciferol (VITAMIN D3) 2000 UNITS TABS, Take 1 tablet by mouth daily.  , Disp: , Rfl:  .  ELIQUIS 5 MG TABS tablet, TAKE  (1)  TABLET TWICE A DAY., Disp: 180 tablet, Rfl: 1 .  furosemide (LASIX) 40 MG tablet, Take 1 tablet (40 mg total) by mouth daily., Disp: 90 tablet, Rfl: 0 .  gabapentin (NEURONTIN) 100 MG capsule, Take 1 capsule (100 mg total) by mouth 2 (two) times daily., Disp: 180 capsule, Rfl: 0 .  iron polysaccharides (FERREX 150) 150 MG capsule, Take 1 capsule (150 mg total) by mouth daily., Disp: 30 capsule, Rfl: 5 .  KLOR-CON 10 10 MEQ tablet, Take 1 tablet (10 mEq total) by mouth daily., Disp: 90 tablet, Rfl: 1 .  levothyroxine (SYNTHROID) 137 MCG tablet, Take 1 tablet (137 mcg total) by mouth daily. as directed, Disp: 90 tablet, Rfl: 1 .  lisinopril (ZESTRIL) 40 MG tablet, Take 1 tablet (40 mg total) by mouth daily., Disp: 90 tablet, Rfl: 0 .  LORazepam (ATIVAN) 0.5 MG tablet, One half tablet to one daily if needed for anxiety, Disp: 30 tablet, Rfl: 1 .  Multiple Vitamins-Minerals (CENTRUM SILVER) tablet, Take 1 tablet by mouth daily. , Disp: , Rfl:  .  omeprazole (PRILOSEC) 20 MG capsule, TAKE 1 CAPSULE BY MOUTH TWICE DAILY BEFORE A MEAL, Disp: 180 capsule, Rfl: 0 .  polyethylene  glycol powder (GLYCOLAX/MIRALAX) powder, DISSOLVE 17 GRAMS (ONE CAPFUL) OF POWDER IN WATER & DRINK ONCE DAILY AS NEEDED, Disp: 850 g, Rfl: 1  Current Facility-Administered Medications:  .  cyanocobalamin ((VITAMIN B-12)) injection 1,000 mcg, 1,000 mcg, Intramuscular, Q30 days, Chipper Herb, MD, 1,000 mcg at 11/20/18 8341  Assessment/ Plan: 85 y.o. female   1. Paronychia of finger, left Has allergy to both Keflex and Septra.  We will proceed with doxycycline for empiric coverage of presumed soft tissue infection.  We discussed frequent soaking and reasons for emergent evaluation including spreading of the infection or joint involvement.  She voiced understanding.  If no significant improvement by  Thursday she is to seek evaluation in the office. - doxycycline (VIBRA-TABS) 100 MG tablet; Take 1 tablet (100 mg total) by mouth 2 (two) times daily for 7 days.  Dispense: 14 tablet; Refill: 0   Start time: 10:55am End time: 11:01am  Total time spent on patient care (including telephone call/ virtual visit): 12 minutes  [place], [place] [phone]

## 2018-12-09 ENCOUNTER — Ambulatory Visit (INDEPENDENT_AMBULATORY_CARE_PROVIDER_SITE_OTHER): Payer: Medicare Other | Admitting: Family Medicine

## 2018-12-09 ENCOUNTER — Encounter: Payer: Self-pay | Admitting: Family Medicine

## 2018-12-09 ENCOUNTER — Ambulatory Visit: Payer: Medicare Other | Admitting: Family Medicine

## 2018-12-09 ENCOUNTER — Other Ambulatory Visit: Payer: Self-pay

## 2018-12-09 DIAGNOSIS — L03012 Cellulitis of left finger: Secondary | ICD-10-CM

## 2018-12-09 DIAGNOSIS — T50905A Adverse effect of unspecified drugs, medicaments and biological substances, initial encounter: Secondary | ICD-10-CM

## 2018-12-09 MED ORDER — CLINDAMYCIN HCL 300 MG PO CAPS: 300.0000 mg | ORAL_CAPSULE | Freq: Four times a day (QID) | ORAL | 0 refills | Status: AC

## 2018-12-09 NOTE — Progress Notes (Signed)
Virtual Visit via Telephone Note  I connected with [name] on 12/09/18 at 8:34 AM by telephone and verified that I am speaking with the correct person using two identifiers. [name] is currently located at home and nobody is currently with her during this visit. The provider, Loman Brooklyn, FNP is located in their home at time of visit.  I discussed the limitations, risks, security and privacy concerns of performing an evaluation and management service by telephone and the availability of in person appointments. I also discussed with the patient that there may be a patient responsible charge related to this service. The patient expressed understanding and agreed to proceed.  Subjective: PCP: Janora Norlander, DO  Chief Complaint  Patient presents with   Medication Reaction   Patient was treated with Doxycycline 100 mg BID x7 days two weeks ago for paronychia. She reports her legs started stinging and had red blotches all over them so she quit taking the Doxycycline and it resolved. She took 3.5 days of the 7 days she was prescribed. She states the finger looks a little better but is still red, warm, and hurts sometimes. She also reports it did peel after she did dishes the other day.    ROS: Per HPI  Current Outpatient Medications:    albuterol (PROVENTIL HFA;VENTOLIN HFA) 108 (90 Base) MCG/ACT inhaler, Inhale 2 puffs into the lungs every 6 (six) hours as needed for wheezing or shortness of breath. (Patient not taking: Reported on 11/07/2018), Disp: 8 g, Rfl: 1   calcium-vitamin D (OSCAL WITH D) 500-200 MG-UNIT per tablet, Take 1 tablet by mouth daily., Disp: , Rfl:    Cholecalciferol (VITAMIN D3) 2000 UNITS TABS, Take 1 tablet by mouth daily.  , Disp: , Rfl:    clindamycin (CLEOCIN) 300 MG capsule, Take 1 capsule (300 mg total) by mouth 4 (four) times daily for 7 days., Disp: 28 capsule, Rfl: 0   ELIQUIS 5 MG TABS tablet, TAKE  (1)  TABLET TWICE A DAY., Disp: 180  tablet, Rfl: 1   furosemide (LASIX) 40 MG tablet, Take 1 tablet (40 mg total) by mouth daily., Disp: 90 tablet, Rfl: 0   gabapentin (NEURONTIN) 100 MG capsule, Take 1 capsule (100 mg total) by mouth 2 (two) times daily., Disp: 180 capsule, Rfl: 0   iron polysaccharides (FERREX 150) 150 MG capsule, Take 1 capsule (150 mg total) by mouth daily., Disp: 30 capsule, Rfl: 5   KLOR-CON 10 10 MEQ tablet, Take 1 tablet (10 mEq total) by mouth daily., Disp: 90 tablet, Rfl: 1   levothyroxine (SYNTHROID) 137 MCG tablet, Take 1 tablet (137 mcg total) by mouth daily. as directed, Disp: 90 tablet, Rfl: 1   lisinopril (ZESTRIL) 40 MG tablet, Take 1 tablet (40 mg total) by mouth daily., Disp: 90 tablet, Rfl: 0   LORazepam (ATIVAN) 0.5 MG tablet, One half tablet to one daily if needed for anxiety, Disp: 30 tablet, Rfl: 1   Multiple Vitamins-Minerals (CENTRUM SILVER) tablet, Take 1 tablet by mouth daily. , Disp: , Rfl:    omeprazole (PRILOSEC) 20 MG capsule, TAKE 1 CAPSULE BY MOUTH TWICE DAILY BEFORE A MEAL, Disp: 180 capsule, Rfl: 0   polyethylene glycol powder (GLYCOLAX/MIRALAX) powder, DISSOLVE 17 GRAMS (ONE CAPFUL) OF POWDER IN WATER & DRINK ONCE DAILY AS NEEDED, Disp: 850 g, Rfl: 1  Current Facility-Administered Medications:    cyanocobalamin ((VITAMIN B-12)) injection 1,000 mcg, 1,000 mcg, Intramuscular, Q30 days, Chipper Herb, MD, 1,000 mcg  at 11/20/18 [id number]  Allergies  Allergen Reactions   Aspirin Nausea Only   Codeine Nausea Only   Sulfa Antibiotics    Vicodin [Hydrocodone-Acetaminophen] Nausea Only   Warfarin Sodium Other (See Comments)    Tingling all over   Cephalexin Rash    Red, rash covering lower limbs.   Doxycycline Rash   Past Medical History:  Diagnosis Date   AF (atrial fibrillation) (HCC)    Arthritis    Diaphragmatic hernia without mention of obstruction or gangrene    Esophagitis    First degree atrioventricular block    Gastric polyp    GERD  (gastroesophageal reflux disease)    Heme positive stool    Hyperlipidemia    Hypertension    Hypothyroid    Prolapse of vaginal walls without mention of uterine prolapse    Rectal polyp     Observations/Objective: A&O  No respiratory distress or wheezing audible over the phone Mood, judgement, and thought processes all WNL  Assessment and Plan: 1. Adverse effect of drug, initial encounter - Doxycycline added to list of allergies due to reaction that resolved when she quit taking it.   2. Paronychia of finger, left - Patient is allergic to cephalosporins and sulfa antibiotics. Doxycycline added to her list of allergies. Rx'd clindamycin. Education provided on paronychia. Encouraged Epsom salt soaks. Advised to come for in-person office visit if symptoms do not resolve with antibiotic use.   - clindamycin (CLEOCIN) 300 MG capsule; Take 1 capsule (300 mg total) by mouth 4 (four) times daily for 7 days.  Dispense: 28 capsule; Refill: 0   Follow Up Instructions:  I discussed the assessment and treatment plan with the patient. The patient was provided an opportunity to ask questions and all were answered. The patient agreed with the plan and demonstrated an understanding of the instructions.   The patient was advised to call back or seek an in-person evaluation if the symptoms worsen or if the condition fails to improve as anticipated.  The above assessment and management plan was discussed with the patient. The patient verbalized understanding of and has agreed to the management plan. Patient is aware to call the clinic if symptoms persist or worsen. Patient is aware when to return to the clinic for a follow-up visit. Patient educated on when it is appropriate to go to the emergency department.   Time call ended: 8:40 AM  I provided 9 minutes of non-face-to-face time during this encounter.  Deliah BostonBritney Pettijohn, MSN, APRN, FNP-C [facility] 12/09/18

## 2018-12-09 NOTE — Patient Instructions (Signed)
Paronychia Paronychia is an infection of the skin. It happens near a fingernail or toenail. It may cause pain and swelling around the nail. In some cases, a fluid-filled bump (abscess) can form near or under the nail. Usually, this condition is not serious, and it clears up with treatment. Follow these instructions at home: Wound care  Keep the affected area clean.  Soak the fingers or toes in warm water as told by your doctor. You may be told to do this for 20 minutes, 2-3 times a day.  Keep the area dry when you are not soaking it.  Do not try to drain a fluid-filled bump on your own.  Follow instructions from your doctor about how to take care of the affected area. Make sure you: ? Wash your hands with soap and water before you change your bandage (dressing). If you cannot use soap and water, use hand sanitizer. ? Change your bandage as told by your doctor.  If you had a fluid-filled bump and your doctor drained it, check the area every day for signs of infection. Check for: ? Redness, swelling, or pain. ? Fluid or blood. ? Warmth. ? Pus or a bad smell. Medicines   Take over-the-counter and prescription medicines only as told by your doctor.  If you were prescribed an antibiotic medicine, take it as told by your doctor. Do not stop taking it even if you start to feel better. General instructions  Avoid touching any chemicals.  Do not pick at the affected area. Prevention  To prevent this condition from happening again: ? Wear rubber gloves when putting your hands in water for washing dishes or other tasks. ? Wear gloves if your hands might touch cleaners or chemicals. ? Avoid injuring your nails or fingertips. ? Do not bite your nails or tear hangnails. ? Do not cut your nails very short. ? Do not cut the skin at the base and sides of the nail (cuticles). ? Use clean nail clippers or scissors when trimming nails. Contact a doctor if:  You feel worse.  You do not get  better.  You have more fluid, blood, or pus coming from the affected area.  Your finger or knuckle is swollen or is hard to move. Get help right away if you have:  A fever or chills.  Redness spreading from the affected area.  Pain in a joint or muscle. Summary  Paronychia is an infection of the skin. It happens near a fingernail or toenail.  This condition may cause pain and swelling around the nail.  Soak the fingers or toes in warm water as told by your doctor.  Usually, this condition is not serious, and it clears up with treatment. This information is not intended to replace advice given to you by your health care provider. Make sure you discuss any questions you have with your health care provider. Document Released: 03/07/2009 Document Revised: 04/05/2017 Document Reviewed: 04/01/2017 Elsevier Patient Education  2020 Elsevier Inc.  

## 2018-12-12 ENCOUNTER — Ambulatory Visit (INDEPENDENT_AMBULATORY_CARE_PROVIDER_SITE_OTHER): Payer: Medicare Other | Admitting: *Deleted

## 2018-12-12 ENCOUNTER — Ambulatory Visit (INDEPENDENT_AMBULATORY_CARE_PROVIDER_SITE_OTHER): Payer: Medicare Other | Admitting: Family Medicine

## 2018-12-12 ENCOUNTER — Encounter: Payer: Self-pay | Admitting: Family Medicine

## 2018-12-12 DIAGNOSIS — Z0000 Encounter for general adult medical examination without abnormal findings: Secondary | ICD-10-CM

## 2018-12-12 DIAGNOSIS — L03116 Cellulitis of left lower limb: Secondary | ICD-10-CM | POA: Diagnosis not present

## 2018-12-12 MED ORDER — MUPIROCIN 2 % EX OINT: 1.0000 "application " | TOPICAL_OINTMENT | Freq: Two times a day (BID) | CUTANEOUS | 2 refills | Status: DC

## 2018-12-12 NOTE — Progress Notes (Signed)
Virtual Visit via telephone Note  I connected with [name] on 12/12/18 at 1336 by telephone and verified that I am speaking with the correct person using two identifiers. [name] is currently located at home and no other people are currently with her during visit. The provider, Elige RadonJoshua A , MD is located in their office at time of visit.  Call ended at 1345  I discussed the limitations, risks, security and privacy concerns of performing an evaluation and management service by telephone and the availability of in person appointments. I also discussed with the patient that there may be a patient responsible charge related to this service. The patient expressed understanding and agreed to proceed.   History and Present Illness: Patient is calling in with complaints of issues with her left leg.  It has been red on the outside of her leg and was seeping fluid and has been going on for 2 weeks.  She says it has been worse over the past few days.  She is peeling and has leg swelling.  She denies fevers or chills. She can't wear compression hoes because of it. The drainage and weeping is clear.  Today it is starting to get better with antibiotic  No diagnosis found.  Outpatient Encounter Medications as of 12/12/2018  Medication Sig  . albuterol (PROVENTIL HFA;VENTOLIN HFA) 108 (90 Base) MCG/ACT inhaler Inhale 2 puffs into the lungs every 6 (six) hours as needed for wheezing or shortness of breath.  . calcium-vitamin D (OSCAL WITH D) 500-200 MG-UNIT per tablet Take 1 tablet by mouth daily.  . Cholecalciferol (VITAMIN D3) 2000 UNITS TABS Take 1 tablet by mouth daily.    . clindamycin (CLEOCIN) 300 MG capsule Take 1 capsule (300 mg total) by mouth 4 (four) times daily for 7 days.  Marland Kitchen. ELIQUIS 5 MG TABS tablet TAKE  (1)  TABLET TWICE A DAY.  . furosemide (LASIX) 40 MG tablet Take 1 tablet (40 mg total) by mouth daily.  Marland Kitchen. gabapentin (NEURONTIN) 100 MG capsule Take 1 capsule (100 mg  total) by mouth 2 (two) times daily.  . iron polysaccharides (FERREX 150) 150 MG capsule Take 1 capsule (150 mg total) by mouth daily.  Marland Kitchen. KLOR-CON 10 10 MEQ tablet Take 1 tablet (10 mEq total) by mouth daily.  Marland Kitchen. levothyroxine (SYNTHROID) 137 MCG tablet Take 1 tablet (137 mcg total) by mouth daily. as directed  . lisinopril (ZESTRIL) 40 MG tablet Take 1 tablet (40 mg total) by mouth daily.  Marland Kitchen. LORazepam (ATIVAN) 0.5 MG tablet One half tablet to one daily if needed for anxiety  . Multiple Vitamins-Minerals (CENTRUM SILVER) tablet Take 1 tablet by mouth daily.   Marland Kitchen. omeprazole (PRILOSEC) 20 MG capsule TAKE 1 CAPSULE BY MOUTH TWICE DAILY BEFORE A MEAL  . polyethylene glycol powder (GLYCOLAX/MIRALAX) powder DISSOLVE 17 GRAMS (ONE CAPFUL) OF POWDER IN WATER & DRINK ONCE DAILY AS NEEDED   Facility-Administered Encounter Medications as of 12/12/2018  Medication  . cyanocobalamin ((VITAMIN B-12)) injection 1,000 mcg    Review of Systems  Constitutional: Negative for chills and fever.  Eyes: Negative for visual disturbance.  Respiratory: Negative for chest tightness and shortness of breath.   Cardiovascular: Negative for chest pain and leg swelling.  Musculoskeletal: Negative for back pain and gait problem.  Skin: Positive for color change, rash and wound.  Neurological: Negative for light-headedness and headaches.  Psychiatric/Behavioral: Negative for agitation and behavioral problems.  All other systems reviewed and are negative.  Observations/Objective: Patient sounds comfortable and in no acute distress  Assessment and Plan: Problem List Items Addressed This Visit    None    Visit Diagnoses    Cellulitis of left lower extremity    -  Primary   Relevant Medications   mupirocin ointment (BACTROBAN) 2 %    Patient is still taking clindamycin it sounds like is getting better, instructed to continue that but she did want an ointment so gave her a topical mupirocin to help with that.   Follow Up Instructions: Patient was instructed to follow-up as needed    I discussed the assessment and treatment plan with the patient. The patient was provided an opportunity to ask questions and all were answered. The patient agreed with the plan and demonstrated an understanding of the instructions.   The patient was advised to call back or seek an in-person evaluation if the symptoms worsen or if the condition fails to improve as anticipated.  The above assessment and management plan was discussed with the patient. The patient verbalized understanding of and has agreed to the management plan. Patient is aware to call the clinic if symptoms persist or worsen. Patient is aware when to return to the clinic for a follow-up visit. Patient educated on when it is appropriate to go to the emergency department.    I provided 9 minutes of non-face-to-face time during this encounter.    Worthy Rancher, MD

## 2018-12-12 NOTE — Progress Notes (Addendum)
MEDICARE ANNUAL WELLNESS VISIT  12/12/2018  Telephone Visit Disclaimer This Medicare AWV was conducted by telephone due to national recommendations for restrictions regarding the COVID-19 Pandemic (e.g. social distancing).  I verified, using two identifiers, that I am speaking with [name] or their authorized healthcare agent. I discussed the limitations, risks, security, and privacy concerns of performing an evaluation and management service by telephone and the potential availability of an in-person appointment in the future. The patient expressed understanding and agreed to proceed.   Subjective:  [name] is a 85 y.o. female patient of Raliegh IpGottschalk, Ashly M, DO who had a Medicare Annual Wellness Visit today via telephone. [name] is Retired and lives alone. She had 2 children her daughter passed away in January. She reports that she is socially active and does interact with friends/family regularly. she is not physically active and enjoys being outside and working with flowers.  Patient Care Team: Raliegh IpGottschalk, Ashly M, DO as PCP - General (Family Medicine) Rollene RotundaHochrein, James, MD as Consulting Physician (Cardiology) Normajean Glasgowowlingson, John, MD (Pain Medicine)  Advanced Directives 12/12/2018 04/30/2018 04/07/2018 12/10/2017 01/06/2015  Does Patient Have a Medical Advance Directive? No No No No No  Would patient like information on creating a medical advance directive? No - Patient declined - No - Patient declined Yes (MAU/Ambulatory/Procedural Areas - Information given) No - patient declined information    Hospital Utilization Over the Past 12 Months: # of hospitalizations or ER visits: 0 # of surgeries: 0  Review of Systems    Patient reports that her overall health is unchanged compared to last year.  Negative except for finger infection and redness on her left leg History obtained from chart review and the patient  Patient Reported Readings (BP, Pulse, CBG, Weight, etc) none   Pain Assessment       Current Medications & Allergies (verified) Allergies as of 12/12/2018      Reactions   Aspirin Nausea Only   Codeine Nausea Only   Sulfa Antibiotics    Vicodin [hydrocodone-acetaminophen] Nausea Only   Warfarin Sodium Other (See Comments)   Tingling all over   Cephalexin Rash   Red, rash covering lower limbs.   Doxycycline Rash      Medication List       Accurate as of December 12, 2018 10:27 AM. If you have any questions, ask your nurse or doctor.        albuterol 108 (90 Base) MCG/ACT inhaler Commonly known as: VENTOLIN HFA Inhale 2 puffs into the lungs every 6 (six) hours as needed for wheezing or shortness of breath.   calcium-vitamin D 500-200 MG-UNIT tablet Commonly known as: OSCAL WITH D Take 1 tablet by mouth daily.   Centrum Silver tablet Take 1 tablet by mouth daily.   clindamycin 300 MG capsule Commonly known as: CLEOCIN Take 1 capsule (300 mg total) by mouth 4 (four) times daily for 7 days.   Eliquis 5 MG Tabs tablet Generic drug: apixaban TAKE  (1)  TABLET TWICE A DAY.   furosemide 40 MG tablet Commonly known as: LASIX Take 1 tablet (40 mg total) by mouth daily.   gabapentin 100 MG capsule Commonly known as: NEURONTIN Take 1 capsule (100 mg total) by mouth 2 (two) times daily.   iron polysaccharides 150 MG capsule Commonly known as: Ferrex 150 Take 1 capsule (150 mg total) by mouth daily.   Klor-Con 10 10 MEQ tablet Generic drug: potassium chloride Take 1 tablet (10 mEq total) by  mouth daily.   levothyroxine 137 MCG tablet Commonly known as: SYNTHROID Take 1 tablet (137 mcg total) by mouth daily. as directed   lisinopril 40 MG tablet Commonly known as: ZESTRIL Take 1 tablet (40 mg total) by mouth daily.   LORazepam 0.5 MG tablet Commonly known as: ATIVAN One half tablet to one daily if needed for anxiety   omeprazole 20 MG capsule Commonly known as: PRILOSEC TAKE 1 CAPSULE BY MOUTH TWICE DAILY BEFORE A MEAL    polyethylene glycol powder 17 GM/SCOOP powder Commonly known as: GLYCOLAX/MIRALAX DISSOLVE 17 GRAMS (ONE CAPFUL) OF POWDER IN WATER & DRINK ONCE DAILY AS NEEDED   Vitamin D3 50 MCG (2000 UT) Tabs Take 1 tablet by mouth daily.       History (reviewed): Past Medical History:  Diagnosis Date  . AF (atrial fibrillation) (HCC)   . Arthritis   . Diaphragmatic hernia without mention of obstruction or gangrene   . Esophagitis   . First degree atrioventricular block   . Gastric polyp   . GERD (gastroesophageal reflux disease)   . Heme positive stool   . Hyperlipidemia   . Hypertension   . Hypothyroid   . Prolapse of vaginal walls without mention of uterine prolapse   . Rectal polyp    Past Surgical History:  Procedure Laterality Date  . DILATION AND CURETTAGE OF UTERUS    . KIDNEY STONE SURGERY     Family History  Problem Relation Age of Onset  . Hypertension Mother   . Aneurysm Mother        Brain  . Hypertension Father   . Kidney disease Father   . Aneurysm Father        heart  . Arthritis Daughter   . Cancer Daughter        breast  . Hyperlipidemia Sister    Social History   Socioeconomic History  . Marital status: Divorced    Spouse name: Not on file  . Number of children: 2  . Years of education: 10th grade  . Highest education level: 10th grade  Occupational History  . Occupation: retired    Comment: [employer]  Social Needs  . Financial resource strain: Not hard at all  . Food insecurity    Worry: Never true    Inability: Never true  . Transportation needs    Medical: No    Non-medical: No  Tobacco Use  . Smoking status: Never Smoker  . Smokeless tobacco: Never Used  Substance and Sexual Activity  . Alcohol use: No  . Drug use: No  . Sexual activity: Not Currently  Lifestyle  . Physical activity    Days per week: 7 days    Minutes per session: 30 min  . Stress: Not at all  Relationships  . Social connections    Talks on phone: More than  three times a week    Gets together: Three times a week    Attends religious service: More than 4 times per year    Active member of club or organization: No    Attends meetings of clubs or organizations: Never    Relationship status: Divorced  Other Topics Concern  . Not on file  Social History Narrative  . Not on file    Activities of Daily Living In your present state of health, do you have any difficulty performing the following activities: 12/12/2018  Hearing? Y  Comment wears hearing aids  Vision? N  Comment wears glasses  Difficulty concentrating  or making decisions? N  Walking or climbing stairs? Y  Comment Knee pain- left  Dressing or bathing? N  Doing errands, shopping? N  Preparing Food and eating ? N  Using the Toilet? N  In the past six months, have you accidently leaked urine? Y  Do you have problems with loss of bowel control? Y  Comment at times if not close to a restroom  Managing your Medications? N  Managing your Finances? N  Housekeeping or managing your Housekeeping? N  Some recent data might be hidden    Patient Education/ Literacy    Exercise Current Exercise Habits: The patient does not participate in regular exercise at present  Diet Patient reports consuming 3 meals a day and 1 snack(s) a day Patient reports that her primary diet is: Regular Patient reports that she does have regular access to food.   Depression Screen PHQ 2/9 Scores 12/12/2018 11/07/2018 11/07/2018 09/02/2018 06/16/2018 05/20/2018 05/08/2018  PHQ - 2 Score 3 3 3  0 0 2 1  PHQ- 9 Score 7 7 8  - - 4 -     Fall Risk Fall Risk  12/12/2018 09/02/2018 06/16/2018 05/20/2018 05/08/2018  Falls in the past year? 0 0 0 0 0  Number falls in past yr: - - - - -  Injury with Fall? - - - - -  Risk Factor Category  - - - - -  Follow up - - - - -     Objective:  Kellie F Gell seemed alert and oriented and she participated appropriately during our telephone visit.  Blood Pressure Weight BMI  BP  Readings from Last 3 Encounters:  11/07/18 139/64  09/02/18 136/61  06/16/18 (!) 141/70   Wt Readings from Last 3 Encounters:  11/07/18 207 lb (93.9 kg)  09/02/18 203 lb 12.8 oz (92.4 kg)  06/16/18 203 lb (92.1 kg)   BMI Readings from Last 1 Encounters:  11/07/18 35.53 kg/m    *Unable to obtain current vital signs, weight, and BMI due to telephone visit type  Hearing/Vision  . Hooria did not seem to have difficulty with hearing/understanding during the telephone conversation . Reports that she has not had a formal eye exam by an eye care professional within the past year . Reports that she has not had a formal hearing evaluation within the past year *Unable to fully assess hearing and vision during telephone visit type  Cognitive Function: 6CIT Screen 12/12/2018  What Year? 0 points  What month? 0 points  What time? 0 points  Count back from 20 0 points  Months in reverse 2 points  Repeat phrase 4 points  Total Score 6   (Normal:0-7, Significant for Dysfunction: >8)  Normal Cognitive Function Screening: Yes   Immunization & Health Maintenance Record Immunization History  Administered Date(s) Administered  . Influenza Whole 12/31/2009  . Influenza, High Dose Seasonal PF 12/29/2015, 01/09/2017, 01/09/2018  . Influenza,inj,Quad PF,6+ Mos 12/24/2012, 12/28/2013, 01/06/2015  . Pneumococcal Conjugate-13 03/16/2013  . Pneumococcal Polysaccharide-23 01/31/2001  . Td 12/01/2004  . Tdap 12/10/2017  . Zoster 03/02/2006    Health Maintenance  Topic Date Due  . MAMMOGRAM  06/25/2018  . INFLUENZA VACCINE  11/01/2018  . TETANUS/TDAP  12/11/2027  . DEXA SCAN  Completed  . PNA vac Low Risk Adult  Completed       Assessment  This is a routine wellness examination for Anouk F Tramble.  Health Maintenance: Due or Overdue Health Maintenance Due  Topic Date Due  .  MAMMOGRAM  06/25/2018  . INFLUENZA VACCINE  11/01/2018    [name] Czech does not need a referral for  Community Assistance: Care Management:   no Social Work:    no Prescription Assistance:  no Nutrition/Diabetes Education:  no   Plan:  Personalized Goals Goals Addressed            This Visit's Progress   . AWV       12/12/2018 AWV Goal: Fall Prevention  . Over the next year, patient will decrease their risk for falls by: o Using assistive devices, such as a cane or walker, as needed o Identifying fall risks within their home and correcting them by: - Removing throw rugs - Adding handrails to stairs or ramps - Removing clutter and keeping a clear pathway throughout the home - Increasing light, especially at night - Adding shower handles/bars - Raising toilet seat o Identifying potential personal risk factors for falls: - Medication side effects - Incontinence/urgency - Vestibular dysfunction - Hearing loss - Musculoskeletal disorders - Neurological disorders - Orthostatic hypotension        Personalized Health Maintenance & Screening Recommendations  Influenza vaccine Screening mammography Eye exam  Lung Cancer Screening Recommended: no (Low Dose CT Chest recommended if Age 28-80 years, 30 pack-year currently smoking OR have quit w/in past 15 years) Hepatitis C Screening recommended: no HIV Screening recommended: no  Advanced Directives: Written information was not prepared per patient's request.  Referrals & Orders No orders of the defined types were placed in this encounter.   Follow-up Plan . Follow-up with Raliegh Ip, DO as planned . Schedule appointment for eye exam and hearing exam . Schedule appointment for flu vaccine . Appointment scheduled with Dr. Louanne Skye to discuss leg    I have personally reviewed and noted the following in the patient's chart:   . Medical and social history . Use of alcohol, tobacco or illicit drugs  . Current medications and supplements . Functional ability and status . Nutritional status . Physical activity  . Advanced directives . List of other physicians . Hospitalizations, surgeries, and ER visits in previous 12 months . Vitals . Screenings to include cognitive, depression, and falls . Referrals and appointments  In addition, I have reviewed and discussed with [name] Czech certain preventive protocols, quality metrics, and best practice recommendations. A written personalized care plan for preventive services as well as general preventive health recommendations is available and can be mailed to the patient at her request.      Sherron Monday, LPN  06/23/4008

## 2018-12-19 ENCOUNTER — Other Ambulatory Visit: Payer: Self-pay

## 2018-12-19 ENCOUNTER — Ambulatory Visit (INDEPENDENT_AMBULATORY_CARE_PROVIDER_SITE_OTHER): Payer: Medicare Other

## 2018-12-19 DIAGNOSIS — E538 Deficiency of other specified B group vitamins: Secondary | ICD-10-CM | POA: Diagnosis not present

## 2018-12-19 NOTE — Progress Notes (Signed)
Cyanocobalamin injection given.  Patient tolerated well. 

## 2018-12-22 ENCOUNTER — Encounter: Payer: Self-pay | Admitting: Family Medicine

## 2018-12-22 ENCOUNTER — Ambulatory Visit (INDEPENDENT_AMBULATORY_CARE_PROVIDER_SITE_OTHER): Payer: Medicare Other | Admitting: Family Medicine

## 2018-12-22 ENCOUNTER — Other Ambulatory Visit: Payer: Self-pay

## 2018-12-22 VITALS — BP 139/68 | HR 67 | Temp 97.1°F | Resp 16 | Ht 64.0 in | Wt 202.0 lb

## 2018-12-22 DIAGNOSIS — L03116 Cellulitis of left lower limb: Secondary | ICD-10-CM | POA: Diagnosis not present

## 2018-12-22 MED ORDER — AMOXICILLIN-POT CLAVULANATE 875-125 MG PO TABS: 1.0000 | ORAL_TABLET | Freq: Two times a day (BID) | ORAL | 0 refills | Status: DC

## 2018-12-22 NOTE — Progress Notes (Signed)
BP 139/68   Pulse 67   Temp (!) 97.1 F (36.2 C) (Temporal)   Resp 16   Ht 5\' 4"  (1.626 m)   Wt 202 lb (91.6 kg)   SpO2 92%   BMI 34.67 kg/m    Subjective:   Patient ID: [name], female    DOB: 14-Nov-1932, 85 y.o.   MRN: [id number]  HPI: [name] is a 85 y.o. female presenting on 12/22/2018 for Cellulitis (Left lower leg- Patient had a phone visit 9/11)   HPI Patient is coming in with complaints of swelling and redness and pain on her left lower leg.  She had a phone visit on 11 November for similar issue.  She says is just not improving.  She did take course of antibiotics topically but has not tried anything oral at this point.  She has a lot of allergies some cannot take a lot of different kinds of antibiotics.  Which patient says the swelling is been there chronically but with pain and redness and warmth developed just over the past few weeks.  Relevant past medical, surgical, family and social history reviewed and updated as indicated. Interim medical history since our last visit reviewed. Allergies and medications reviewed and updated.  Review of Systems  Constitutional: Negative for chills and fever.  Eyes: Negative for visual disturbance.  Respiratory: Negative for chest tightness and shortness of breath.   Cardiovascular: Negative for chest pain and leg swelling.  Musculoskeletal: Negative for back pain and gait problem.  Skin: Positive for color change and rash. Negative for wound.  Neurological: Negative for light-headedness and headaches.  Psychiatric/Behavioral: Negative for agitation and behavioral problems.  All other systems reviewed and are negative.   Per HPI unless specifically indicated above   Allergies as of 12/22/2018      Reactions   Aspirin Nausea Only   Codeine Nausea Only   Sulfa Antibiotics    Vicodin [hydrocodone-acetaminophen] Nausea Only   Warfarin Sodium Other (See Comments)   Tingling all over   Cephalexin Rash   Red, rash  covering lower limbs.   Doxycycline Rash      Medication List       Accurate as of December 22, 2018 12:44 PM. If you have any questions, ask your nurse or doctor.        albuterol 108 (90 Base) MCG/ACT inhaler Commonly known as: VENTOLIN HFA Inhale 2 puffs into the lungs every 6 (six) hours as needed for wheezing or shortness of breath.   amoxicillin-clavulanate 875-125 MG tablet Commonly known as: AUGMENTIN Take 1 tablet by mouth 2 (two) times daily. Started by: Worthy Rancher, MD   calcium-vitamin D 500-200 MG-UNIT tablet Commonly known as: OSCAL WITH D Take 1 tablet by mouth daily.   Centrum Silver tablet Take 1 tablet by mouth daily.   Eliquis 5 MG Tabs tablet Generic drug: apixaban TAKE  (1)  TABLET TWICE A DAY.   furosemide 40 MG tablet Commonly known as: LASIX Take 1 tablet (40 mg total) by mouth daily.   gabapentin 100 MG capsule Commonly known as: NEURONTIN Take 1 capsule (100 mg total) by mouth 2 (two) times daily.   iron polysaccharides 150 MG capsule Commonly known as: Ferrex 150 Take 1 capsule (150 mg total) by mouth daily.   Klor-Con 10 10 MEQ tablet Generic drug: potassium chloride Take 1 tablet (10 mEq total) by mouth daily.   levothyroxine 137 MCG tablet Commonly known as: SYNTHROID Take 1 tablet (137  mcg total) by mouth daily. as directed   lisinopril 40 MG tablet Commonly known as: ZESTRIL Take 1 tablet (40 mg total) by mouth daily.   LORazepam 0.5 MG tablet Commonly known as: ATIVAN One half tablet to one daily if needed for anxiety   mupirocin ointment 2 % Commonly known as: BACTROBAN Place 1 application into the nose 2 (two) times daily.   omeprazole 20 MG capsule Commonly known as: PRILOSEC TAKE 1 CAPSULE BY MOUTH TWICE DAILY BEFORE A MEAL   polyethylene glycol powder 17 GM/SCOOP powder Commonly known as: GLYCOLAX/MIRALAX DISSOLVE 17 GRAMS (ONE CAPFUL) OF POWDER IN WATER & DRINK ONCE DAILY AS NEEDED   Vitamin D3 50  MCG (2000 UT) Tabs Take 1 tablet by mouth daily.        Objective:   BP 139/68   Pulse 67   Temp (!) 97.1 F (36.2 C) (Temporal)   Resp 16   Ht 5\' 4"  (1.626 m)   Wt 202 lb (91.6 kg)   SpO2 92%   BMI 34.67 kg/m   Wt Readings from Last 3 Encounters:  12/22/18 202 lb (91.6 kg)  11/07/18 207 lb (93.9 kg)  09/02/18 203 lb 12.8 oz (92.4 kg)    Physical Exam Vitals signs and nursing note reviewed.  Constitutional:      General: She is not in acute distress.    Appearance: She is well-developed. She is not diaphoretic.  Eyes:     Conjunctiva/sclera: Conjunctivae normal.  Skin:    General: Skin is warm and dry.     Findings: Erythema and rash (Welling and redness and warmth of her left anterior lower leg about 5 cm in diameter.) present.  Neurological:     Mental Status: She is alert and oriented to person, place, and time.     Coordination: Coordination normal.  Psychiatric:        Behavior: Behavior normal.       Assessment & Plan:   Problem List Items Addressed This Visit    None    Visit Diagnoses    Cellulitis of left lower leg    -  Primary   Relevant Medications   amoxicillin-clavulanate (AUGMENTIN) 875-125 MG tablet      Patient has multiple allergies and will do Augmentin and have her follow-up in 1 week. Follow up plan: Return if symptoms worsen or fail to improve.  Counseling provided for all of the vaccine components No orders of the defined types were placed in this encounter.   Arville CareJoshua Dettinger, MD [facility] 12/22/2018, 12:44 PM

## 2018-12-23 ENCOUNTER — Telehealth: Payer: Self-pay | Admitting: Family Medicine

## 2018-12-23 NOTE — Telephone Encounter (Signed)
Patient states that she was seen yesterday for cellulitis.  Advised to clean with water soap.

## 2018-12-25 DIAGNOSIS — M79676 Pain in unspecified toe(s): Secondary | ICD-10-CM | POA: Diagnosis not present

## 2018-12-25 DIAGNOSIS — B351 Tinea unguium: Secondary | ICD-10-CM | POA: Diagnosis not present

## 2018-12-25 DIAGNOSIS — L84 Corns and callosities: Secondary | ICD-10-CM | POA: Diagnosis not present

## 2018-12-25 DIAGNOSIS — I70203 Unspecified atherosclerosis of native arteries of extremities, bilateral legs: Secondary | ICD-10-CM | POA: Diagnosis not present

## 2018-12-30 ENCOUNTER — Encounter: Payer: Self-pay | Admitting: Family Medicine

## 2018-12-30 ENCOUNTER — Other Ambulatory Visit: Payer: Self-pay

## 2018-12-30 ENCOUNTER — Ambulatory Visit (INDEPENDENT_AMBULATORY_CARE_PROVIDER_SITE_OTHER): Payer: Medicare Other | Admitting: Family Medicine

## 2018-12-30 VITALS — BP 138/68 | HR 66 | Temp 97.8°F | Ht 64.0 in | Wt 203.0 lb

## 2018-12-30 DIAGNOSIS — L03116 Cellulitis of left lower limb: Secondary | ICD-10-CM | POA: Diagnosis not present

## 2018-12-30 DIAGNOSIS — R600 Localized edema: Secondary | ICD-10-CM

## 2018-12-30 NOTE — Progress Notes (Signed)
Subjective: CC: 1 wk f/u on cellulitis PCP: Janora Norlander, DO OVF:IEPPIR F Moffet is a 85 y.o. female presenting to clinic today for:  1. LLE cellulitis Patient was seen 1 week ago by Dr. Warrick Parisian for left lower extremity cellulitis.  She was treated with Augmentin instructed to follow-up.  She notes that symptoms have gotten slightly better but she attributes this to application of a corticosteroid to the lower extremities.  She has been compliant with the Augmentin.  Denies any fevers.  She has ongoing bilateral lower extremity edema.   ROS: Per HPI  Allergies  Allergen Reactions  . Aspirin Nausea Only  . Codeine Nausea Only  . Sulfa Antibiotics   . Vicodin [Hydrocodone-Acetaminophen] Nausea Only  . Warfarin Sodium Other (See Comments)    Tingling all over  . Cephalexin Rash    Red, rash covering lower limbs.  . Doxycycline Rash   Past Medical History:  Diagnosis Date  . AF (atrial fibrillation) ([place])   . Arthritis   . Diaphragmatic hernia without mention of obstruction or gangrene   . Esophagitis   . First degree atrioventricular block   . Gastric polyp   . GERD (gastroesophageal reflux disease)   . Heme positive stool   . Hyperlipidemia   . Hypertension   . Hypothyroid   . Prolapse of vaginal walls without mention of uterine prolapse   . Rectal polyp     Current Outpatient Medications:  .  albuterol (PROVENTIL HFA;VENTOLIN HFA) 108 (90 Base) MCG/ACT inhaler, Inhale 2 puffs into the lungs every 6 (six) hours as needed for wheezing or shortness of breath., Disp: 8 g, Rfl: 1 .  amoxicillin-clavulanate (AUGMENTIN) 875-125 MG tablet, Take 1 tablet by mouth 2 (two) times daily., Disp: 20 tablet, Rfl: 0 .  calcium-vitamin D (OSCAL WITH D) 500-200 MG-UNIT per tablet, Take 1 tablet by mouth daily., Disp: , Rfl:  .  Cholecalciferol (VITAMIN D3) 2000 UNITS TABS, Take 1 tablet by mouth daily.  , Disp: , Rfl:  .  ELIQUIS 5 MG TABS tablet, TAKE  (1)  TABLET TWICE A DAY.,  Disp: 180 tablet, Rfl: 1 .  furosemide (LASIX) 40 MG tablet, Take 1 tablet (40 mg total) by mouth daily., Disp: 90 tablet, Rfl: 0 .  gabapentin (NEURONTIN) 100 MG capsule, Take 1 capsule (100 mg total) by mouth 2 (two) times daily., Disp: 180 capsule, Rfl: 0 .  iron polysaccharides (FERREX 150) 150 MG capsule, Take 1 capsule (150 mg total) by mouth daily., Disp: 30 capsule, Rfl: 5 .  KLOR-CON 10 10 MEQ tablet, Take 1 tablet (10 mEq total) by mouth daily., Disp: 90 tablet, Rfl: 1 .  levothyroxine (SYNTHROID) 137 MCG tablet, Take 1 tablet (137 mcg total) by mouth daily. as directed, Disp: 90 tablet, Rfl: 1 .  lisinopril (ZESTRIL) 40 MG tablet, Take 1 tablet (40 mg total) by mouth daily., Disp: 90 tablet, Rfl: 0 .  LORazepam (ATIVAN) 0.5 MG tablet, One half tablet to one daily if needed for anxiety, Disp: 30 tablet, Rfl: 1 .  Multiple Vitamins-Minerals (CENTRUM SILVER) tablet, Take 1 tablet by mouth daily. , Disp: , Rfl:  .  mupirocin ointment (BACTROBAN) 2 %, Place 1 application into the nose 2 (two) times daily., Disp: 30 g, Rfl: 2 .  omeprazole (PRILOSEC) 20 MG capsule, TAKE 1 CAPSULE BY MOUTH TWICE DAILY BEFORE A MEAL, Disp: 180 capsule, Rfl: 0 .  polyethylene glycol powder (GLYCOLAX/MIRALAX) powder, DISSOLVE 17 GRAMS (ONE CAPFUL) OF POWDER IN WATER &  DRINK ONCE DAILY AS NEEDED, Disp: 850 g, Rfl: 1  Current Facility-Administered Medications:  .  cyanocobalamin ((VITAMIN B-12)) injection 1,000 mcg, 1,000 mcg, Intramuscular, Q30 days, Ernestina Penna, MD, 1,000 mcg at 12/19/18 1013 Social History   Socioeconomic History  . Marital status: Divorced    Spouse name: Not on file  . Number of children: 2  . Years of education: 10th grade  . Highest education level: 10th grade  Occupational History  . Occupation: retired    Comment: [employer]  Social Needs  . Financial resource strain: Not hard at all  . Food insecurity    Worry: Never true    Inability: Never true  . Transportation needs     Medical: No    Non-medical: No  Tobacco Use  . Smoking status: Never Smoker  . Smokeless tobacco: Never Used  Substance and Sexual Activity  . Alcohol use: No  . Drug use: No  . Sexual activity: Not Currently  Lifestyle  . Physical activity    Days per week: 7 days    Minutes per session: 30 min  . Stress: Not at all  Relationships  . Social connections    Talks on phone: More than three times a week    Gets together: Three times a week    Attends religious service: More than 4 times per year    Active member of club or organization: No    Attends meetings of clubs or organizations: Never    Relationship status: Divorced  . Intimate partner violence    Fear of current or ex partner: No    Emotionally abused: No    Physically abused: No    Forced sexual activity: No  Other Topics Concern  . Not on file  Social History Narrative  . Not on file   Family History  Problem Relation Age of Onset  . Hypertension Mother   . Aneurysm Mother        Brain  . Hypertension Father   . Kidney disease Father   . Aneurysm Father        heart  . Arthritis Daughter   . Cancer Daughter        breast  . Heart attack Daughter   . Hyperlipidemia Sister     Objective: Office vital signs reviewed. BP 138/68   Pulse 66   Temp 97.8 F (36.6 C) (Temporal)   Ht 5\' 4"  (1.626 m)   Wt 203 lb (92.1 kg)   SpO2 96%   BMI 34.84 kg/m   Physical Examination:  General: Awake, alert, nontoxic, No acute distress Extremities: Significant edema noted bilaterally to knees.  Bilateral lower extremities with some skin breakdown.  The left lower extremity with erythema noted distally, particularly on the lateral aspect of the lower leg.  She has some serous drainage appreciated.  There is palpable warmth.     Assessment/ Plan: 85 y.o. female   1. Cellulitis of left lower extremity Continue Augmentin.  I would like to see her back in 3 days for recheck.  We discussed that if there is no  substantial improvement low threshold to have her evaluated emergency department as she will need IV antibiotics for treatment.  Unfortunately given extensive allergies to multiple classes of antibiotics no other orals are available for treatment at this point.  I do question if today's findings are more reflective of a venous stasis dermatitis due to lymphedema bilaterally.  I had her wrapped in Unna boots bilaterally.  We discussed red flag signs and symptoms warranting further evaluation emergency department.  2. Bilateral edema of lower extremity Referral to lymphedema clinic placed. - Ambulatory referral to Physical Therapy   Orders Placed This Encounter  Procedures  . Ambulatory referral to Physical Therapy    Referral Priority:   Routine    Referral Type:   Physical Medicine    Referral Reason:   Specialty Services Required    Requested Specialty:   Physical Therapy    Number of Visits Requested:   1   No orders of the defined types were placed in this encounter.    Raliegh IpAshly M Seleny Allbright, DO [facility] [phone]

## 2019-01-01 ENCOUNTER — Telehealth: Payer: Self-pay | Admitting: Family Medicine

## 2019-01-02 ENCOUNTER — Encounter: Payer: Self-pay | Admitting: Family Medicine

## 2019-01-02 ENCOUNTER — Ambulatory Visit (INDEPENDENT_AMBULATORY_CARE_PROVIDER_SITE_OTHER): Payer: Medicare Other | Admitting: Family Medicine

## 2019-01-02 ENCOUNTER — Other Ambulatory Visit: Payer: Self-pay

## 2019-01-02 VITALS — BP 135/67 | HR 63 | Temp 98.6°F | Ht 64.0 in | Wt 205.0 lb

## 2019-01-02 DIAGNOSIS — L03116 Cellulitis of left lower limb: Secondary | ICD-10-CM

## 2019-01-02 DIAGNOSIS — R600 Localized edema: Secondary | ICD-10-CM | POA: Diagnosis not present

## 2019-01-02 MED ORDER — AMOXICILLIN-POT CLAVULANATE 875-125 MG PO TABS: 1.0000 | ORAL_TABLET | Freq: Two times a day (BID) | ORAL | 0 refills | Status: AC

## 2019-01-02 NOTE — Progress Notes (Signed)
Subjective: CC: recheck on cellulitis/ edema PCP: Janora Norlander, DO OVF:IEPPIR [name] is a 85 y.o. female presenting to clinic today for:  1. LLE cellulitis Patient is here for 3-day recheck of left lower extremity cellulitis.  She notes that symptoms have been stable since her previous visit.  She has since finished the Augmentin.  Denies any purulence from the legs.  No increased pain.  No fevers, chills, fatigue.  She overall is feeling well.  She is tolerated the Unna boots without difficulty.  She has heard from the lymphedema clinic and has an appointment scheduled in about 2-1/2 weeks.  ROS: Per HPI  Allergies  Allergen Reactions  . Aspirin Nausea Only  . Codeine Nausea Only  . Sulfa Antibiotics   . Vicodin [Hydrocodone-Acetaminophen] Nausea Only  . Warfarin Sodium Other (See Comments)    Tingling all over  . Cephalexin Rash    Red, rash covering lower limbs.  . Doxycycline Rash   Past Medical History:  Diagnosis Date  . AF (atrial fibrillation) ([place])   . Arthritis   . Diaphragmatic hernia without mention of obstruction or gangrene   . Esophagitis   . First degree atrioventricular block   . Gastric polyp   . GERD (gastroesophageal reflux disease)   . Heme positive stool   . Hyperlipidemia   . Hypertension   . Hypothyroid   . Prolapse of vaginal walls without mention of uterine prolapse   . Rectal polyp     Current Outpatient Medications:  .  albuterol (PROVENTIL HFA;VENTOLIN HFA) 108 (90 Base) MCG/ACT inhaler, Inhale 2 puffs into the lungs every 6 (six) hours as needed for wheezing or shortness of breath., Disp: 8 g, Rfl: 1 .  amoxicillin-clavulanate (AUGMENTIN) 875-125 MG tablet, Take 1 tablet by mouth 2 (two) times daily., Disp: 20 tablet, Rfl: 0 .  calcium-vitamin D (OSCAL WITH D) 500-200 MG-UNIT per tablet, Take 1 tablet by mouth daily., Disp: , Rfl:  .  Cholecalciferol (VITAMIN D3) 2000 UNITS TABS, Take 1 tablet by mouth daily.  , Disp: , Rfl:  .   ELIQUIS 5 MG TABS tablet, TAKE  (1)  TABLET TWICE A DAY., Disp: 180 tablet, Rfl: 1 .  furosemide (LASIX) 40 MG tablet, Take 1 tablet (40 mg total) by mouth daily., Disp: 90 tablet, Rfl: 0 .  gabapentin (NEURONTIN) 100 MG capsule, Take 1 capsule (100 mg total) by mouth 2 (two) times daily., Disp: 180 capsule, Rfl: 0 .  iron polysaccharides (FERREX 150) 150 MG capsule, Take 1 capsule (150 mg total) by mouth daily., Disp: 30 capsule, Rfl: 5 .  KLOR-CON 10 10 MEQ tablet, Take 1 tablet (10 mEq total) by mouth daily., Disp: 90 tablet, Rfl: 1 .  levothyroxine (SYNTHROID) 137 MCG tablet, Take 1 tablet (137 mcg total) by mouth daily. as directed, Disp: 90 tablet, Rfl: 1 .  lisinopril (ZESTRIL) 40 MG tablet, Take 1 tablet (40 mg total) by mouth daily., Disp: 90 tablet, Rfl: 0 .  LORazepam (ATIVAN) 0.5 MG tablet, One half tablet to one daily if needed for anxiety, Disp: 30 tablet, Rfl: 1 .  Multiple Vitamins-Minerals (CENTRUM SILVER) tablet, Take 1 tablet by mouth daily. , Disp: , Rfl:  .  mupirocin ointment (BACTROBAN) 2 %, Place 1 application into the nose 2 (two) times daily., Disp: 30 g, Rfl: 2 .  omeprazole (PRILOSEC) 20 MG capsule, TAKE 1 CAPSULE BY MOUTH TWICE DAILY BEFORE A MEAL, Disp: 180 capsule, Rfl: 0 .  polyethylene glycol powder (  GLYCOLAX/MIRALAX) powder, DISSOLVE 17 GRAMS (ONE CAPFUL) OF POWDER IN WATER & DRINK ONCE DAILY AS NEEDED, Disp: 850 g, Rfl: 1  Current Facility-Administered Medications:  .  cyanocobalamin ((VITAMIN B-12)) injection 1,000 mcg, 1,000 mcg, Intramuscular, Q30 days, Ernestina PennaMoore, Donald W, MD, 1,000 mcg at 12/19/18 1013 Social History   Socioeconomic History  . Marital status: Divorced    Spouse name: Not on file  . Number of children: 2  . Years of education: 10th grade  . Highest education level: 10th grade  Occupational History  . Occupation: retired    Comment: [employer]  Social Needs  . Financial resource strain: Not hard at all  . Food insecurity    Worry: Never true     Inability: Never true  . Transportation needs    Medical: No    Non-medical: No  Tobacco Use  . Smoking status: Never Smoker  . Smokeless tobacco: Never Used  Substance and Sexual Activity  . Alcohol use: No  . Drug use: No  . Sexual activity: Not Currently  Lifestyle  . Physical activity    Days per week: 7 days    Minutes per session: 30 min  . Stress: Not at all  Relationships  . Social connections    Talks on phone: More than three times a week    Gets together: Three times a week    Attends religious service: More than 4 times per year    Active member of club or organization: No    Attends meetings of clubs or organizations: Never    Relationship status: Divorced  . Intimate partner violence    Fear of current or ex partner: No    Emotionally abused: No    Physically abused: No    Forced sexual activity: No  Other Topics Concern  . Not on file  Social History Narrative  . Not on file   Family History  Problem Relation Age of Onset  . Hypertension Mother   . Aneurysm Mother        Brain  . Hypertension Father   . Kidney disease Father   . Aneurysm Father        heart  . Arthritis Daughter   . Cancer Daughter        breast  . Heart attack Daughter   . Hyperlipidemia Sister     Objective: Office vital signs reviewed. BP 135/67   Pulse 63   Temp 98.6 [name] (37 C) (Temporal)   Ht 5\' 4"  (1.626 m)   Wt 205 lb (93 kg)   BMI 35.19 kg/m   Physical Examination:  General: Awake, alert, nontoxic, No acute distress Extremities: Erythema has improved some since last visit though is still present along the lateral aspect of the left lower extremity and mildly on the distal aspect of the left lower extremity.  She has ongoing edema but this is better than previous check.  No drainage noted on today's exam.  Mild warmth.  Assessment/ Plan: 85 y.o. female   1. Cellulitis of left lower extremity I have given her 5 additional days of Augmentin and would like her  to return in 3 days for recheck of the legs.  I do not think at this point she needs to go to the emergency department for IV antibiotics as her symptoms seem to be improving with current therapies.  Unna boots were placed again on her legs.  We discussed red flag signs and symptoms warranting further evaluation.  She voiced good  understanding.  2. Bilateral edema of lower extremity Keep legs elevated.  Keep appointment with lymphedema clinic.   No orders of the defined types were placed in this encounter.  Meds ordered this encounter  Medications  . amoxicillin-clavulanate (AUGMENTIN) 875-125 MG tablet    Sig: Take 1 tablet by mouth 2 (two) times daily for 5 days.    Dispense:  10 tablet    Refill:  0     Onesimo Lingard Hulen Skains, DO [facility] [phone]

## 2019-01-02 NOTE — Patient Instructions (Addendum)
Legs are looking some better.  You are getting wrapped again.   I am renewing your antibiotic a few more days. Follow up on Monday or Tuesday for recheck.  Keep appointment with the leg swelling specialist.

## 2019-01-05 ENCOUNTER — Other Ambulatory Visit: Payer: Self-pay

## 2019-01-05 ENCOUNTER — Encounter: Payer: Self-pay | Admitting: Family Medicine

## 2019-01-05 ENCOUNTER — Ambulatory Visit (INDEPENDENT_AMBULATORY_CARE_PROVIDER_SITE_OTHER): Payer: Medicare Other | Admitting: Family Medicine

## 2019-01-05 VITALS — BP 127/67 | HR 64 | Temp 98.0°F | Resp 20 | Ht 64.0 in | Wt 204.0 lb

## 2019-01-05 DIAGNOSIS — L03115 Cellulitis of right lower limb: Secondary | ICD-10-CM | POA: Diagnosis not present

## 2019-01-05 DIAGNOSIS — L03116 Cellulitis of left lower limb: Secondary | ICD-10-CM

## 2019-01-05 DIAGNOSIS — R600 Localized edema: Secondary | ICD-10-CM

## 2019-01-05 DIAGNOSIS — R748 Abnormal levels of other serum enzymes: Secondary | ICD-10-CM

## 2019-01-05 MED ORDER — AMOXICILLIN-POT CLAVULANATE 875-125 MG PO TABS: 1.0000 | ORAL_TABLET | Freq: Two times a day (BID) | ORAL | 0 refills | Status: AC

## 2019-01-05 NOTE — Progress Notes (Signed)
Subjective:  Patient ID: [name], female    DOB: 1932/05/29, 85 y.o.   MRN: [id number]  Patient Care Team: Janora Norlander, DO as PCP - General (Family Medicine) Minus Breeding, MD as Consulting Physician (Cardiology) Clyde Canterbury, MD (Pain Medicine)   Chief Complaint:  Cellulitis (bilateral lower extrem. )   HPI: [name] is a 85 y.o. female presenting on 01/05/2019 for Cellulitis (bilateral lower extrem. )   Pt presents today for follow up of cellulitis to bilateral lower legs. Pt was initially seen via virtual visit on 12/12/2018 and placed on Bactroban ointment. Pt had a follow up visit on 12/22/2018 with lack of improvement in symptoms and was placed on Augmentin due to multiple drug allergies. Pt was reevaluated on 12/30/2018 and unna boots were placed along with a referral to the lymphedema clinic. She was seen on 01/02/2019 for removal of the unna boots and reevaluation. Dr. Lajuana Ripple continued the Augmentin for 5 more days at this time and reapplied the unna boots. Pt reports she has been taking the medication as prescribed. No fever, chills, weakness, confusion, fatigue, or decreased urination. No drainage from wounds. No abdominal pain or diarrhea. No chest pain, shortness of breath, palpitations, or syncope. She denies pain in lower extremities and has tolerated treatments to date. She has upcoming appointment with lymphedema clinic.    Relevant past medical, surgical, family, and social history reviewed and updated as indicated.  Allergies and medications reviewed and updated. Date reviewed: Chart in Epic.   Past Medical History:  Diagnosis Date  . AF (atrial fibrillation) ([place])   . Arthritis   . Diaphragmatic hernia without mention of obstruction or gangrene   . Esophagitis   . First degree atrioventricular block   . Gastric polyp   . GERD (gastroesophageal reflux disease)   . Heme positive stool   . Hyperlipidemia   . Hypertension   .  Hypothyroid   . Prolapse of vaginal walls without mention of uterine prolapse   . Rectal polyp     Past Surgical History:  Procedure Laterality Date  . DILATION AND CURETTAGE OF UTERUS    . KIDNEY STONE SURGERY      Social History   Socioeconomic History  . Marital status: Divorced    Spouse name: Not on file  . Number of children: 2  . Years of education: 10th grade  . Highest education level: 10th grade  Occupational History  . Occupation: retired    Comment: [employer]  Social Needs  . Financial resource strain: Not hard at all  . Food insecurity    Worry: Never true    Inability: Never true  . Transportation needs    Medical: No    Non-medical: No  Tobacco Use  . Smoking status: Never Smoker  . Smokeless tobacco: Never Used  Substance and Sexual Activity  . Alcohol use: No  . Drug use: No  . Sexual activity: Not Currently  Lifestyle  . Physical activity    Days per week: 7 days    Minutes per session: 30 min  . Stress: Not at all  Relationships  . Social connections    Talks on phone: More than three times a week    Gets together: Three times a week    Attends religious service: More than 4 times per year    Active member of club or organization: No    Attends meetings of clubs or organizations: Never    Relationship  status: Divorced  . Intimate partner violence    Fear of current or ex partner: No    Emotionally abused: No    Physically abused: No    Forced sexual activity: No  Other Topics Concern  . Not on file  Social History Narrative  . Not on file    Outpatient Encounter Medications as of 01/05/2019  Medication Sig  . albuterol (PROVENTIL HFA;VENTOLIN HFA) 108 (90 Base) MCG/ACT inhaler Inhale 2 puffs into the lungs every 6 (six) hours as needed for wheezing or shortness of breath.  Marland Kitchen amoxicillin-clavulanate (AUGMENTIN) 875-125 MG tablet Take 1 tablet by mouth 2 (two) times daily for 5 days.  . calcium-vitamin D (OSCAL WITH D) 500-200 MG-UNIT per  tablet Take 1 tablet by mouth daily.  . Cholecalciferol (VITAMIN D3) 2000 UNITS TABS Take 1 tablet by mouth daily.    Marland Kitchen ELIQUIS 5 MG TABS tablet TAKE  (1)  TABLET TWICE A DAY.  . furosemide (LASIX) 40 MG tablet Take 1 tablet (40 mg total) by mouth daily.  Marland Kitchen gabapentin (NEURONTIN) 100 MG capsule Take 1 capsule (100 mg total) by mouth 2 (two) times daily.  . iron polysaccharides (FERREX 150) 150 MG capsule Take 1 capsule (150 mg total) by mouth daily.  Marland Kitchen KLOR-CON 10 10 MEQ tablet Take 1 tablet (10 mEq total) by mouth daily.  Marland Kitchen levothyroxine (SYNTHROID) 137 MCG tablet Take 1 tablet (137 mcg total) by mouth daily. as directed  . lisinopril (ZESTRIL) 40 MG tablet Take 1 tablet (40 mg total) by mouth daily.  Marland Kitchen LORazepam (ATIVAN) 0.5 MG tablet One half tablet to one daily if needed for anxiety  . Multiple Vitamins-Minerals (CENTRUM SILVER) tablet Take 1 tablet by mouth daily.   . mupirocin ointment (BACTROBAN) 2 % Place 1 application into the nose 2 (two) times daily.  Marland Kitchen omeprazole (PRILOSEC) 20 MG capsule TAKE 1 CAPSULE BY MOUTH TWICE DAILY BEFORE A MEAL  . polyethylene glycol powder (GLYCOLAX/MIRALAX) powder DISSOLVE 17 GRAMS (ONE CAPFUL) OF POWDER IN WATER & DRINK ONCE DAILY AS NEEDED  . amoxicillin-clavulanate (AUGMENTIN) 875-125 MG tablet Take 1 tablet by mouth 2 (two) times daily for 5 days.   Facility-Administered Encounter Medications as of 01/05/2019  Medication  . cyanocobalamin ((VITAMIN B-12)) injection 1,000 mcg    Allergies  Allergen Reactions  . Aspirin Nausea Only  . Codeine Nausea Only  . Sulfa Antibiotics   . Vicodin [Hydrocodone-Acetaminophen] Nausea Only  . Warfarin Sodium Other (See Comments)    Tingling all over  . Cephalexin Rash    Red, rash covering lower limbs.  . Doxycycline Rash    Review of Systems  Constitutional: Negative for activity change, appetite change, chills, diaphoresis, fatigue, fever and unexpected weight change.  HENT: Negative.   Eyes:  Negative.   Respiratory: Negative for cough, chest tightness and shortness of breath.   Cardiovascular: Positive for leg swelling. Negative for chest pain and palpitations.  Gastrointestinal: Negative for abdominal pain, blood in stool, constipation, diarrhea, nausea and vomiting.  Endocrine: Negative.   Genitourinary: Negative for decreased urine volume, difficulty urinating, dysuria, frequency and urgency.  Musculoskeletal: Positive for gait problem (unsteady and slow). Negative for arthralgias and myalgias.  Skin: Positive for color change.  Allergic/Immunologic: Negative.   Neurological: Negative for dizziness, tremors, seizures, syncope, facial asymmetry, speech difficulty, weakness, light-headedness, numbness and headaches.  Hematological: Negative.   Psychiatric/Behavioral: Negative for confusion, hallucinations, sleep disturbance and suicidal ideas.  All other systems reviewed and are negative.  Objective:  BP 127/67   Pulse 64   Temp 98 F (36.7 C)   Resp 20   Ht _0  (1.626 m)   Wt 204 lb (92.5 kg)   SpO2 96%   BMI 35.02 kg/m    Wt Readings from Last 3 Encounters:  01/05/19 204 lb (92.5 kg)  01/02/19 205 lb (93 kg)  12/30/18 203 lb (92.1 kg)    Physical Exam Vitals signs and nursing note reviewed. Exam conducted with a chaperone present.  Constitutional:      General: She is not in acute distress.    Appearance: Normal appearance. She is obese. She is not ill-appearing, toxic-appearing or diaphoretic.  HENT:     Head: Normocephalic and atraumatic.     Mouth/Throat:     Mouth: Mucous membranes are moist.     Pharynx: Oropharynx is clear.  Eyes:     Conjunctiva/sclera: Conjunctivae normal.     Pupils: Pupils are equal, round, and reactive to light.  Cardiovascular:     Rate and Rhythm: Normal rate. Rhythm regularly irregular.     Pulses: Normal pulses.     Heart sounds: Murmur present. Systolic murmur present with a grade of 2/6. No friction rub. No  gallop.   Pulmonary:     Effort: Pulmonary effort is normal. No respiratory distress.     Breath sounds: Normal breath sounds.  Musculoskeletal:     Right lower leg: 2+ Pitting Edema present.     Left lower leg: 2+ Pitting Edema present.  Skin:    General: Skin is warm and dry.     Capillary Refill: Capillary refill takes less than 2 seconds.     Findings: Erythema and rash present. Rash is crusting.     Comments: Right lateral lower extremity with slight erythema and crusting. No increased warmth or drainage. No significant surrounding erythema.  Left lateral lower extremity with erythema, several crusted lesions without drainage, minimally increased warmth, and slight surrounding erythema with swelling.   Neurological:     General: No focal deficit present.     Mental Status: She is alert and oriented to person, place, and time.     Gait: Gait abnormal (slow and unsteady).  Psychiatric:        Mood and Affect: Mood normal.        Behavior: Behavior normal.        Thought Content: Thought content normal.        Judgment: Judgment normal.     Right lower extremity.    Left lower extremity.   Results for orders placed or performed in visit on 11/07/18  ToxASSURE Select 13 (MW), Urine  Result Value Ref Range   Summary Note   Thyroid Panel With TSH  Result Value Ref Range   TSH 1.450 0.450 - 4.500 uIU/mL   T4, Total 9.4 4.5 - 12.0 ug/dL   T3 Uptake Ratio 30 24 - 39 %   Free Thyroxine Index 2.8 1.2 - 4.9  Basic Metabolic Panel  Result Value Ref Range   Glucose 89 65 - 99 mg/dL   BUN 19 8 - 27 mg/dL   Creatinine, Ser 1.32 (H) 0.57 - 1.00 mg/dL   GFR calc non Af Amer 37 (L) >59 mL/min/1.73   GFR calc Af Amer 43 (L) >59 mL/min/1.73   BUN/Creatinine Ratio 14 12 - 28   Sodium 141 134 - 144 mmol/L   Potassium 4.3 3.5 - 5.2 mmol/L   Chloride 96 96 - 106 mmol/L  CO2 28 20 - 29 mmol/L   Calcium 9.7 8.7 - 10.3 mg/dL  CBC  Result Value Ref Range   WBC 3.4 3.4 - 10.8 x10E3/uL    RBC 3.33 (L) 3.77 - 5.28 x10E6/uL   Hemoglobin 10.4 (L) 11.1 - 15.9 g/dL   Hematocrit 31.0 (L) 34.0 - 46.6 %   MCV 93 79 - 97 fL   MCH 31.2 26.6 - 33.0 pg   MCHC 33.5 31.5 - 35.7 g/dL   RDW 12.7 11.7 - 15.4 %   Platelets 58 (LL) 150 - 450 x10E3/uL   Hematology Comments: Note:      Pertinent labs & imaging results that were available during my care of the patient were reviewed by me and considered in my medical decision making.  Assessment & Plan:  [name] was seen today for cellulitis.  Diagnoses and all orders for this visit:  Case discussed with Dr. Livia Snellen and he agrees with treatment plan.   Cellulitis of left lower leg Area remains concerning for ongoing cellulitis. Will reapply unna boot in office today and extend antibiotics for another 5 days. Unna boot removal in 3 days. This has improved since last visit, so I do not feel there is an indication for IV antibiotics. Pt will keep follow up appointment with lymphedema clinic. Will check CBC and CMP today due to ongoing symptoms. Pt aware of symptoms that will require evaluation and treatment in the ED. Pt aware to report any diarrhea or vomiting.  -     CBC with Differential/Platelet -     amoxicillin-clavulanate (AUGMENTIN) 875-125 MG tablet; Take 1 tablet by mouth 2 (two) times daily for 5 days. -     Apply unna boot  Cellulitis of right lower leg Great improvement in symptoms. Continue antibiotics as prescribed and follow up with lymphedema clinic as discussed.  -     CBC with Differential/Platelet -     amoxicillin-clavulanate (AUGMENTIN) 875-125 MG tablet; Take 1 tablet by mouth 2 (two) times daily for 5 days.  Bilateral edema of lower extremity Ongoing. Has follow up with lymphedema clinic. Will check CMP today.  -     CMP14+EGFR     Continue all other maintenance medications.  Follow up plan: Return in about 3 days (around 01/08/2019), or if symptoms worsen or fail to improve, for remove [employer], reassess  cellulitis.  Continue healthy lifestyle choices, including diet (rich in fruits, vegetables, and lean proteins, and low in salt and simple carbohydrates) and exercise (at least 30 minutes of moderate physical activity daily).  Educational handout given for cellulitis   The above assessment and management plan was discussed with the patient. The patient verbalized understanding of and has agreed to the management plan. Patient is aware to call the clinic if they develop any new symptoms or if symptoms persist or worsen. Patient is aware when to return to the clinic for a follow-up visit. Patient educated on when it is appropriate to go to the emergency department.   Monia Pouch, FNP-C [facility] [phone]

## 2019-01-05 NOTE — Patient Instructions (Signed)

## 2019-01-06 LAB — CMP14+EGFR
ALT: 15 IU/L (ref 0–32)
AST: 29 IU/L (ref 0–40)
Albumin/Globulin Ratio: 1.6 (ref 1.2–2.2)
Albumin: 4.2 g/dL (ref 3.6–4.6)
Alkaline Phosphatase: 195 IU/L — ABNORMAL HIGH (ref 39–117)
BUN/Creatinine Ratio: 13 (ref 12–28)
BUN: 17 mg/dL (ref 8–27)
Bilirubin Total: 1 mg/dL (ref 0.0–1.2)
CO2: 29 mmol/L (ref 20–29)
Calcium: 9.4 mg/dL (ref 8.7–10.3)
Chloride: 98 mmol/L (ref 96–106)
Creatinine, Ser: 1.34 mg/dL — ABNORMAL HIGH (ref 0.57–1.00)
GFR calc Af Amer: 42 mL/min/{1.73_m2} — ABNORMAL LOW (ref >59)
GFR calc non Af Amer: 36 mL/min/{1.73_m2} — ABNORMAL LOW (ref >59)
Globulin, Total: 2.6 g/dL (ref 1.5–4.5)
Glucose: 105 mg/dL — ABNORMAL HIGH (ref 65–99)
Potassium: 4.2 mmol/L (ref 3.5–5.2)
Sodium: 141 mmol/L (ref 134–144)
Total Protein: 6.8 g/dL (ref 6.0–8.5)

## 2019-01-06 LAB — CBC WITH DIFFERENTIAL/PLATELET
Basophils Absolute: 0 10*3/uL (ref 0.0–0.2)
Basos: 1 %
EOS (ABSOLUTE): 0.3 10*3/uL (ref 0.0–0.4)
Eos: 6 %
Hematocrit: 33.5 % — ABNORMAL LOW (ref 34.0–46.6)
Hemoglobin: 11.1 g/dL (ref 11.1–15.9)
Immature Grans (Abs): 0 10*3/uL (ref 0.0–0.1)
Immature Granulocytes: 0 %
Lymphocytes Absolute: 0.9 10*3/uL (ref 0.7–3.1)
Lymphs: 19 %
MCH: 30.9 pg (ref 26.6–33.0)
MCHC: 33.1 g/dL (ref 31.5–35.7)
MCV: 93 fL (ref 79–97)
Monocytes Absolute: 0.5 10*3/uL (ref 0.1–0.9)
Monocytes: 11 %
Neutrophils Absolute: 2.9 10*3/uL (ref 1.4–7.0)
Neutrophils: 63 %
Platelets: 74 10*3/uL — CL (ref 150–450)
RBC: 3.59 x10E6/uL — ABNORMAL LOW (ref 3.77–5.28)
RDW: 13 % (ref 11.7–15.4)
WBC: 4.6 10*3/uL (ref 3.4–10.8)

## 2019-01-07 ENCOUNTER — Other Ambulatory Visit: Payer: Self-pay

## 2019-01-07 ENCOUNTER — Ambulatory Visit (INDEPENDENT_AMBULATORY_CARE_PROVIDER_SITE_OTHER): Payer: Medicare Other | Admitting: Family Medicine

## 2019-01-07 ENCOUNTER — Encounter: Payer: Self-pay | Admitting: Family Medicine

## 2019-01-07 VITALS — BP 130/68 | HR 70 | Temp 98.0°F | Ht 64.0 in | Wt 204.0 lb

## 2019-01-07 DIAGNOSIS — R600 Localized edema: Secondary | ICD-10-CM

## 2019-01-07 DIAGNOSIS — Z23 Encounter for immunization: Secondary | ICD-10-CM | POA: Diagnosis not present

## 2019-01-07 NOTE — Addendum Note (Signed)
Addended by: Baruch Gouty on: 01/07/2019 10:35 AM   Modules accepted: Orders

## 2019-01-07 NOTE — Progress Notes (Signed)
Subjective: CC: recheck on cellulitis/ edema PCP: Janora Norlander, DO MEQ:ASTMHD F Celli is a 85 y.o. female presenting to clinic today for:  1. Bilateral lower extremity edema Patient is here for recheck of edema.  She continues to have tightness in the lower extremities.  Redness is improving from the cellulitis.  She has not yet picked up her second refill of the Augmentin but has been taking her Augmentin as prescribed.  ROS: Per HPI  Allergies  Allergen Reactions  . Aspirin Nausea Only  . Codeine Nausea Only  . Sulfa Antibiotics   . Vicodin [Hydrocodone-Acetaminophen] Nausea Only  . Warfarin Sodium Other (See Comments)    Tingling all over  . Cephalexin Rash    Red, rash covering lower limbs.  . Doxycycline Rash   Past Medical History:  Diagnosis Date  . AF (atrial fibrillation) ([place])   . Arthritis   . Diaphragmatic hernia without mention of obstruction or gangrene   . Esophagitis   . First degree atrioventricular block   . Gastric polyp   . GERD (gastroesophageal reflux disease)   . Heme positive stool   . Hyperlipidemia   . Hypertension   . Hypothyroid   . Prolapse of vaginal walls without mention of uterine prolapse   . Rectal polyp     Current Outpatient Medications:  .  albuterol (PROVENTIL HFA;VENTOLIN HFA) 108 (90 Base) MCG/ACT inhaler, Inhale 2 puffs into the lungs every 6 (six) hours as needed for wheezing or shortness of breath., Disp: 8 g, Rfl: 1 .  amoxicillin-clavulanate (AUGMENTIN) 875-125 MG tablet, Take 1 tablet by mouth 2 (two) times daily for 5 days., Disp: 10 tablet, Rfl: 0 .  amoxicillin-clavulanate (AUGMENTIN) 875-125 MG tablet, Take 1 tablet by mouth 2 (two) times daily for 5 days., Disp: 10 tablet, Rfl: 0 .  calcium-vitamin D (OSCAL WITH D) 500-200 MG-UNIT per tablet, Take 1 tablet by mouth daily., Disp: , Rfl:  .  Cholecalciferol (VITAMIN D3) 2000 UNITS TABS, Take 1 tablet by mouth daily.  , Disp: , Rfl:  .  ELIQUIS 5 MG TABS tablet,  TAKE  (1)  TABLET TWICE A DAY., Disp: 180 tablet, Rfl: 1 .  furosemide (LASIX) 40 MG tablet, Take 1 tablet (40 mg total) by mouth daily., Disp: 90 tablet, Rfl: 0 .  gabapentin (NEURONTIN) 100 MG capsule, Take 1 capsule (100 mg total) by mouth 2 (two) times daily., Disp: 180 capsule, Rfl: 0 .  iron polysaccharides (FERREX 150) 150 MG capsule, Take 1 capsule (150 mg total) by mouth daily., Disp: 30 capsule, Rfl: 5 .  KLOR-CON 10 10 MEQ tablet, Take 1 tablet (10 mEq total) by mouth daily., Disp: 90 tablet, Rfl: 1 .  levothyroxine (SYNTHROID) 137 MCG tablet, Take 1 tablet (137 mcg total) by mouth daily. as directed, Disp: 90 tablet, Rfl: 1 .  lisinopril (ZESTRIL) 40 MG tablet, Take 1 tablet (40 mg total) by mouth daily., Disp: 90 tablet, Rfl: 0 .  LORazepam (ATIVAN) 0.5 MG tablet, One half tablet to one daily if needed for anxiety, Disp: 30 tablet, Rfl: 1 .  Multiple Vitamins-Minerals (CENTRUM SILVER) tablet, Take 1 tablet by mouth daily. , Disp: , Rfl:  .  mupirocin ointment (BACTROBAN) 2 %, Place 1 application into the nose 2 (two) times daily., Disp: 30 g, Rfl: 2 .  omeprazole (PRILOSEC) 20 MG capsule, TAKE 1 CAPSULE BY MOUTH TWICE DAILY BEFORE A MEAL, Disp: 180 capsule, Rfl: 0 .  polyethylene glycol powder (GLYCOLAX/MIRALAX) powder, DISSOLVE 17  GRAMS (ONE CAPFUL) OF POWDER IN WATER & DRINK ONCE DAILY AS NEEDED, Disp: 850 g, Rfl: 1  Current Facility-Administered Medications:  .  cyanocobalamin ((VITAMIN B-12)) injection 1,000 mcg, 1,000 mcg, Intramuscular, Q30 days, Ernestina Penna, MD, 1,000 mcg at 12/19/18 1013 Social History   Socioeconomic History  . Marital status: Divorced    Spouse name: Not on file  . Number of children: 2  . Years of education: 10th grade  . Highest education level: 10th grade  Occupational History  . Occupation: retired    Comment: [employer]  Social Needs  . Financial resource strain: Not hard at all  . Food insecurity    Worry: Never true    Inability: Never true   . Transportation needs    Medical: No    Non-medical: No  Tobacco Use  . Smoking status: Never Smoker  . Smokeless tobacco: Never Used  Substance and Sexual Activity  . Alcohol use: No  . Drug use: No  . Sexual activity: Not Currently  Lifestyle  . Physical activity    Days per week: 7 days    Minutes per session: 30 min  . Stress: Not at all  Relationships  . Social connections    Talks on phone: More than three times a week    Gets together: Three times a week    Attends religious service: More than 4 times per year    Active member of club or organization: No    Attends meetings of clubs or organizations: Never    Relationship status: Divorced  . Intimate partner violence    Fear of current or ex partner: No    Emotionally abused: No    Physically abused: No    Forced sexual activity: No  Other Topics Concern  . Not on file  Social History Narrative  . Not on file   Family History  Problem Relation Age of Onset  . Hypertension Mother   . Aneurysm Mother        Brain  . Hypertension Father   . Kidney disease Father   . Aneurysm Father        heart  . Arthritis Daughter   . Cancer Daughter        breast  . Heart attack Daughter   . Hyperlipidemia Sister     Objective: Office vital signs reviewed. BP 130/68   Pulse 70   Temp 98 F (36.7 C) (Temporal)   Ht 5\' 4"  (1.626 m)   Wt 204 lb (92.5 kg)   BMI 35.02 kg/m   Physical Examination:  General: Awake, alert, nontoxic, No acute distress Extremities: Erythema continues to improve since last visit though is still present along the lateral aspect of the left lower extremity.  Lower extremity edema present to bilateral knees.  Left worse than right.  Assessment/ Plan: 85 y.o. female   1. Bilateral edema of lower extremity Stable.  The cellulitis looks to have improved.  Think the residual redness is again related to ongoing edema.  I have asked that she increase her furosemide to twice daily dosing until  we see each other again on Friday.  Plan for repeat BMP.  She is to keep her lymphedema appointment.  Continue Augmentin.    No orders of the defined types were placed in this encounter.  No orders of the defined types were placed in this encounter.    Sunday, DO [facility] [phone]

## 2019-01-09 ENCOUNTER — Other Ambulatory Visit: Payer: Self-pay

## 2019-01-09 ENCOUNTER — Encounter: Payer: Self-pay | Admitting: Family Medicine

## 2019-01-09 ENCOUNTER — Ambulatory Visit (INDEPENDENT_AMBULATORY_CARE_PROVIDER_SITE_OTHER): Payer: Medicare Other | Admitting: Family Medicine

## 2019-01-09 VITALS — BP 142/64 | HR 59 | Temp 97.3°F | Resp 18 | Ht 64.0 in | Wt 203.0 lb

## 2019-01-09 DIAGNOSIS — L03116 Cellulitis of left lower limb: Secondary | ICD-10-CM

## 2019-01-09 NOTE — Progress Notes (Signed)
Chief Complaint  Patient presents with  . Recheck left leg    HPI  Patient presents today for replacement of fresh unna boot for leg ulcers.She says the left leg looks and feels better to her.  PMH: Smoking status noted ROS: Per HPI  Objective: BP (!) 142/64   Pulse (!) 59   Temp (!) 97.3 F (36.3 C) (Temporal)   Resp 18   Ht 5\' 4"  (1.626 m)   Wt 203 lb (92.1 kg)   SpO2 92%   BMI 34.84 kg/m  Gen: NAD, alert, cooperative with exam HEENT: NCAT, EOMI, PERRL Ext: LLE 2+ edema, warm multiple scabbed ulcers. Mild erythema surrounds them    Neuro: Alert and oriented, No gross deficits  Assessment and plan:  No diagnosis found.  No orders of the defined types were placed in this encounter.   No orders of the defined types were placed in this encounter.   Follow up as needed.  Claretta Fraise, MD

## 2019-01-12 ENCOUNTER — Other Ambulatory Visit: Payer: Self-pay

## 2019-01-13 ENCOUNTER — Ambulatory Visit (INDEPENDENT_AMBULATORY_CARE_PROVIDER_SITE_OTHER): Payer: Medicare Other | Admitting: Family Medicine

## 2019-01-13 ENCOUNTER — Encounter: Payer: Self-pay | Admitting: Family Medicine

## 2019-01-13 VITALS — BP 125/59 | HR 77 | Temp 99.8°F | Ht 64.0 in | Wt 205.0 lb

## 2019-01-13 DIAGNOSIS — I878 Other specified disorders of veins: Secondary | ICD-10-CM | POA: Diagnosis not present

## 2019-01-13 DIAGNOSIS — L97201 Non-pressure chronic ulcer of unspecified calf limited to breakdown of skin: Secondary | ICD-10-CM

## 2019-01-13 DIAGNOSIS — R600 Localized edema: Secondary | ICD-10-CM | POA: Diagnosis not present

## 2019-01-13 DIAGNOSIS — I83002 Varicose veins of unspecified lower extremity with ulcer of calf: Secondary | ICD-10-CM

## 2019-01-13 NOTE — Progress Notes (Signed)
Chief Complaint  Patient presents with  . Cellulitis    left lower leg- recheck    HPI  Patient presents today for recheck of the left lower extremity.  She has an appointment in 1 week at lymphedema clinic.  They are still swollen today.  There is more redness and open lesion also noted on the right lower extremity.  The left is essentially unchanged.  PMH: Smoking status noted ROS: Per HPI  Objective: BP (!) 125/59   Pulse 77   Temp 99.8 F (37.7 C) (Temporal)   Ht 5\' 4"  (1.626 m)   Wt 205 lb (93 kg)   SpO2 90%   BMI 35.19 kg/m  Gen: NAD, alert, cooperative with exam HEENT: NCAT, EOMI, PERRL CV: RRR, good S1/S2, no murmur Resp: CTABL, no wheezes, non-labored Abd: SNTND, BS present, no guarding or organomegaly Ext: 3+ edema/elephantiasis-like edema both lower extremities from the tibial tuberosity to the toes.  There are multiple superficial ulcers and scaly eruptions.  With moderate erythema noted    Neuro: Alert and oriented, No gross deficits  Assessment and plan:  1. Venous stasis of both lower extremities   2. Bilateral edema of lower extremity   3. Venous stasis ulcer of calf limited to breakdown of skin with varicose veins, unspecified laterality ([place])     Unna boot placed on each leg from ankle to knee.    Follow up as needed.  Claretta Fraise, MD

## 2019-01-15 ENCOUNTER — Telehealth: Payer: Self-pay | Admitting: Family Medicine

## 2019-01-16 ENCOUNTER — Other Ambulatory Visit: Payer: Self-pay

## 2019-01-16 ENCOUNTER — Ambulatory Visit (INDEPENDENT_AMBULATORY_CARE_PROVIDER_SITE_OTHER): Payer: Medicare Other

## 2019-01-16 DIAGNOSIS — E538 Deficiency of other specified B group vitamins: Secondary | ICD-10-CM | POA: Diagnosis not present

## 2019-01-16 NOTE — Progress Notes (Signed)
Cyanocobalamin injection given to left deltoid.  Patient tolerated well. 

## 2019-01-19 ENCOUNTER — Telehealth (HOSPITAL_COMMUNITY): Payer: Self-pay | Admitting: Physical Therapy

## 2019-01-19 NOTE — Telephone Encounter (Signed)
pt called to cancel due to she could not make this appt today. Pt has been rescheduled.

## 2019-01-20 ENCOUNTER — Ambulatory Visit (HOSPITAL_COMMUNITY): Payer: Medicare Other | Admitting: Physical Therapy

## 2019-01-21 ENCOUNTER — Telehealth: Payer: Self-pay | Admitting: Family Medicine

## 2019-01-21 ENCOUNTER — Encounter: Payer: Self-pay | Admitting: *Deleted

## 2019-01-21 NOTE — Telephone Encounter (Signed)
Patient aware of results.

## 2019-01-22 ENCOUNTER — Other Ambulatory Visit: Payer: Self-pay | Admitting: Family Medicine

## 2019-01-22 DIAGNOSIS — I10 Essential (primary) hypertension: Secondary | ICD-10-CM

## 2019-01-23 ENCOUNTER — Encounter (HOSPITAL_COMMUNITY): Payer: Self-pay | Admitting: Physical Therapy

## 2019-01-23 ENCOUNTER — Ambulatory Visit (HOSPITAL_COMMUNITY): Payer: Medicare Other | Attending: Family Medicine | Admitting: Physical Therapy

## 2019-01-23 ENCOUNTER — Other Ambulatory Visit: Payer: Self-pay

## 2019-01-23 DIAGNOSIS — M79605 Pain in left leg: Secondary | ICD-10-CM | POA: Diagnosis not present

## 2019-01-23 DIAGNOSIS — I890 Lymphedema, not elsewhere classified: Secondary | ICD-10-CM | POA: Diagnosis not present

## 2019-01-23 DIAGNOSIS — S81802A Unspecified open wound, left lower leg, initial encounter: Secondary | ICD-10-CM | POA: Diagnosis not present

## 2019-01-23 NOTE — Therapy (Signed)
[facility] [address] [place], [place], [address] Phone: [phone]   Fax:  [phone]  Physical Therapy Evaluation  Patient Details  Name: [name] MRN: [id number] Date of Birth: 1932-08-23 Referring Provider ([name]): Delynn FlavinAshly Gottschalk   Encounter Date: 01/23/2019  [name] End of Session - 01/23/19 1253    Visit Number  1    Number of Visits  18    Date for [name] Re-Evaluation  03/06/19    Authorization Type  [employer] medicare/medicaid    [name] Start Time  1045    [name] Stop Time  1212    [name] Time Calculation (min)  87 min    Activity Tolerance  Patient tolerated treatment well    Behavior During Therapy  [facility] for tasks assessed/performed       Past Medical History:  Diagnosis Date  . AF (atrial fibrillation) (HCC)   . Arthritis   . Diaphragmatic hernia without mention of obstruction or gangrene   . Esophagitis   . First degree atrioventricular block   . Gastric polyp   . GERD (gastroesophageal reflux disease)   . Heme positive stool   . Hyperlipidemia   . Hypertension   . Hypothyroid   . Prolapse of vaginal walls without mention of uterine prolapse   . Rectal polyp     Past Surgical History:  Procedure Laterality Date  . DILATION AND CURETTAGE OF UTERUS    . KIDNEY STONE SURGERY      There were no vitals filed for this visit.   Subjective Assessment - 01/23/19 1054    Subjective  [name] states that she has had swelling in both her legs off and on for years.  She has been [facility] after havng a reaction from an antibiotic whch flarred her legs up.  She has had another reaction to a different antibiotic earlier this year.  Her Lt leg is more swollen than her RT.  Her legs burn, hurt and sting.  She was wearing compression stockings but she can no longer fit into  them.  She has noted that her legs have begun  weeping earlier this week.  .  She does not have a compression pump.    Patient is  accompained by:  Family member    Pertinent History  CHF, CVI    Limitations  Standing    How long can you sit comfortably?  no problem    How long can you stand comfortably?  5-10 min    How long can you walk comfortably?  [name] does not walk much    Currently in Pain?  Yes    Pain Score  7     Pain Location  Leg    Pain Orientation  Right;Left    Pain Descriptors / Indicators  Burning;Sore;Tightness    Pain Type  Chronic pain    Pain Onset  1 to 4 weeks ago    Pain Frequency  Intermittent    Aggravating Factors   swelling    Pain Relieving Factors  wrapping    Effect of Pain on Daily Activities  limits         [facility] [name] Assessment - 01/23/19 0001      Assessment   Medical Diagnosis  Bilateral LE Lymphedema    Referring Provider ([name])  Delynn FlavinAshly Gottschalk    Onset Date/Surgical Date  10/01/18    Prior Therapy  none   MD wrapped LE at office  at least five times      Precautions   Precautions  None      Restrictions   Weight Bearing Restrictions  No      Balance Screen   Has the patient fallen in the past 6 months  No    Has the patient had a decrease in activity level because of a fear of falling?   Yes    Is the patient reluctant to leave their home because of a fear of falling?   No      Home Environment   Living Environment  Private residence    Type of [place] Access  Ramped entrance      Prior Function   Level of Independence  Independent      Cognition   Overall Cognitive Status  Within Functional Limits for tasks assessed      Observation/Other Assessments   Observations  LE LE with open wounds, weeping from lateral leg and blister on medial aspect.         LYMPHEDEMA/ONCOLOGY QUESTIONNAIRE - 01/23/19 1108      What other symptoms do you have   Are you Having Heaviness or Tightness  Yes    Are you having Pain  Yes    Are you having pitting edema  Yes    Body Site  LE    Is it Hard or Difficult finding clothes that fit  Yes    Stemmer Sign   Yes    Other Symptoms  weeping       Lymphedema Stage   Stage  STAGE 2 SPONTANEOUSLY IRREVERSIBLE      Lymphedema Assessments   Lymphedema Assessments  Lower extremities      Right Lower Extremity Lymphedema   At Groin Measure at Horizontal from Pubic Bone  0 cm    20 cm Proximal to Suprapatella  59.5 cm    10 cm Proximal to Suprapatella  57.5 cm    At Midpatella/Popliteal Crease  52.3 cm    30 cm Proximal to Floor at Lateral Plantar Foot  47.9 cm    20 cm Proximal to Floor at Lateral Plantar Foot  39.7 1    10  cm Proximal to Floor at Lateral Malleoli  29.8 cm    Circumference of ankle/heel  37.5 cm.    5 cm Proximal to 1st MTP Joint  28 cm    Across MTP Joint  27 cm    Around Proximal Great Toe  8.5 cm      Left Lower Extremity Lymphedema   20 cm Proximal to Suprapatella  61 cm    10 cm Proximal to Suprapatella  58.6 cm    At Midpatella/Popliteal Crease  52 cm    30 cm Proximal to Floor at Lateral Plantar Foot  48 cm    20 cm Proximal to Floor at Lateral Plantar Foot  39.3 cm    10 cm Proximal to Floor at Lateral Malleoli  31.2 cm    Circumference of ankle/heel  37.8 cm.    5 cm Proximal to 1st MTP Joint  29 cm    Across MTP Joint  27.5 cm    Around Proximal Great Toe  8.7 cm             Objective measurements completed on examination: See above findings.     Wound Therapy - 01/23/19 1054    Subjective  see above    Patient and Family Stated  Goals  wounds to heal weeping to stop.     Date of Onset  12/02/18   for wounds; edema for months   Prior Treatments  antibiotic, MD has bandaged several times.     Evaluation and Treatment Procedures Explained to Patient/Family  Yes    Evaluation and Treatment Procedures  agreed to    Wound Properties Date First Assessed: 01/23/19 Time First Assessed: 1056 Wound Type: Venous stasis ulcer , complicated by lymphedema  Location: Leg Location Orientation: Left;Lateral Wound Description (Comments): 3 wounds most superior   Present on Admission: Yes   Dressing Type  Compression wrap    Dressing Changed  Changed    Dressing Status  Old drainage;New drainage    Dressing Change Frequency  PRN    Site / Wound Assessment  Painful;Red    % Wound base Red or Granulating  80%    % Wound base Yellow/Fibrinous Exudate  20%    Peri-wound Assessment  Induration;Erythema (blanchable);Edema    Wound Length (cm)  1 cm    Wound Width (cm)  0.8 cm    Wound Depth (cm)  0.2 cm    Wound Volume (cm^3)  0.16 cm^3    Wound Surface Area (cm^2)  0.8 cm^2    Drainage Amount  Moderate    Drainage Description  Serous    Treatment  Cleansed;Debridement (Selective);Pressure applied    Wound Properties Date First Assessed: 01/23/19 Time First Assessed: 1059 Wound Type: Venous stasis ulcer;Other (Comment) Location: Leg Location Orientation: Left;Lateral;Posterior Wound Description (Comments): .6 x .5    Dressing Changed  Changed    Dressing Status  Old drainage;New drainage    Dressing Change Frequency  PRN    Site / Wound Assessment  Granulation tissue;Painful;Yellow    % Wound base Red or Granulating  90%    % Wound base Yellow/Fibrinous Exudate  10%    Peri-wound Assessment  Induration;Erythema (blanchable)    Wound Length (cm)  0.6 cm    Wound Width (cm)  0.5 cm    Wound Depth (cm)  0.1 cm    Wound Volume (cm^3)  0.03 cm^3    Wound Surface Area (cm^2)  0.3 cm^2    Treatment  Cleansed;Debridement (Selective)    Wound Properties Date First Assessed: 01/23/19 Time First Assessed: 1052 Wound Type: Venous stasis ulcer;Other (Comment) Location: Leg Location Orientation: Left;Distal Wound Description (Comments): most inferior wound    Dressing Type  Compression wrap    Dressing Changed  Changed    Dressing Status  Old drainage;New drainage    Dressing Change Frequency  PRN    Site / Wound Assessment  Red;Yellow    % Wound base Red or Granulating  90%    % Wound base Yellow/Fibrinous Exudate  10%    Peri-wound Assessment   Induration;Edema;Erythema (blanchable)    Wound Length (cm)  0.7 cm    Wound Width (cm)  1.5 cm    Wound Depth (cm)  0.2 cm    Wound Volume (cm^3)  0.21 cm^3    Wound Surface Area (cm^2)  1.05 cm^2    Drainage Amount  Minimal    Drainage Description  Serous    Treatment  Debridement (Selective);Cleansed    Wound Properties Date First Assessed: 01/23/19 Time First Assessed: 1054 Wound Type: Venous stasis ulcer;Other (Comment) Location: Leg Location Orientation: Left;Medial   Dressing Type  Compression wrap    Dressing Changed  Changed    Dressing Status  Old drainage    Dressing  Change Frequency  PRN    Site / Wound Assessment  Dry;Painful    % Wound base Red or Granulating  85%    % Wound base Yellow/Fibrinous Exudate  15%    Wound Length (cm)  0.5 cm    Wound Width (cm)  4.7 cm    Wound Depth (cm)  --   unsure as there is a blister within this measurement    Wound Surface Area (cm^2)  2.35 cm^2    Wound Properties Date First Assessed: 01/23/19 Time First Assessed: 1057 Wound Type: Venous stasis ulcer Location: Other (Comment) Location Orientation: Right Wound Description (Comments): medial LE has several small blisters which have not opened yet  Present on Admission: Yes   Selective Debridement - Location  wounds and dry skin around wounds     Selective Debridement - Tools Used  Forceps    Selective Debridement - Tissue Removed  slough and dry skin     Wound Therapy - Clinical Statement  see note     Wound Therapy - Functional Problem List  difficuclty walking, difficulty wearing clothes due to weeping of wound.    Factors Delaying/Impairing Wound Healing  Vascular compromise;Other (comment)   chronic venous insufficiency, lymphedema, CHF   Hydrotherapy Plan  Debridement;Dressing change;Patient/family education;Other (comment)   lymphedema treatment    Wound Therapy - Frequency  3X / week    Wound Therapy - Current Recommendations  [name]    Wound Plan  [name] to be seen for debridement  and wound care as well as lymphedema treatment .     Dressing   profore lite B with calcium alginate used on lateral aspect of Lt LE to absorb weeping.       [facility] Adult [name] Treatment/Exercise - 01/23/19 0001      Exercises   Exercises  --   x5 @ ankle pumps, LAQ, march, hipab/ad, diaphargmic breath            [name] Education - 01/23/19 1249    Education Details  Do not get dressing wet.  Take dressing off if it becomes uncomfortable.[name] to complete exercises for lymph circulation twice a day.    Person(s) Educated  Patient    Methods  Explanation;Handout;Demonstration    Comprehension  Verbalized understanding;Returned demonstration       [name] Short Term Goals - 01/23/19 1502      [name] SHORT TERM GOAL #1   Title  Wounds on Lt LE to no longer be weeping    Time  3    Period  Weeks    Status  New    Target Date  02/13/19      [name] SHORT TERM GOAL #2   Title  wounds to be 100% granulated    Time  2    Period  Weeks    Status  New    Target Date  02/06/19      [name] SHORT TERM GOAL #3   Title  [name] wounds on her Lt LE to be decreased by 50% in size and there to no longer be any blisters on [name]'s Rt LE    Time  3    Period  Weeks    Status  New    Target Date  02/13/19        [name] Long Term Goals - 01/23/19 1504      [name] LONG TERM GOAL #1   Title  All wounds to be healed    Time  6  Period  Weeks    Status  New    Target Date  03/06/19      [name] LONG TERM GOAL #2   Title  [name] to have decreased 3-4 cm in volume B to allow pain to decrease to no greater than a 2/10 to be able to complete household tasks without resting    Time  6    Period  Weeks      [name] LONG TERM GOAL #3   Title  [name] to be able to don shoes and socks without difficulty    Time  6    Period  Weeks    Status  New      [name] LONG TERM GOAL #4   Title  [name] to have obtained and be using a compression pump,(order sent to MD today)    Time  6    Period  Weeks    Status  New      [name] LONG TERM GOAL #5   Title   [name] to have and be able to don and doff compression garment and to be able to verbalize she is to wear them everyday.    Time  6    Period  Weeks    Status  New             Plan - 01/23/19 1255    Clinical Impression Statement  [name] is an 85 yo female who has had swelling in her LE for years, she has had two reactions to antibiotics this year that increased her edema and now her legs are weeping.  She has been seen by her MD for bandaging but it does not seem to be helping therefore she has been referred to skilled [name] for lymphedema and wound care.  Evaluation demonstrates Lt LE with five wounds and LE weeping ; RT LE with blisters both LE are indurated and painful.  [name] will benefit from skilled [name] to create a healing envirionment for her wounds, decrease volume to allow [name] to fit in her shoes and socks and allow [name] to be able to ambulate without fatigue as well as education on how to maintain decreased fluid volumes with lymphedema.    Personal Factors and Comorbidities  Age;Comorbidity 3+    Comorbidities  CHF, CVI lymphedema    Examination-Activity Limitations  Dressing;Hygiene/Grooming;Stand;Locomotion Level;Other    Examination-Participation Restrictions  Community Activity    Stability/Clinical Decision Making  Evolving/Moderate complexity    Clinical Decision Making  Moderate    Rehab Potential  Good    [name] Frequency  3x / week    [name] Duration  6 weeks    [name] Treatment/Interventions  ADLs/Self Care Home Management;Manual techniques;Manual lymph drainage;Compression bandaging;Other (comment)   debridment   [name] Next Visit Plan  assess wounds, continue with profore until foam has arrived.  Give [name] the list of bandages that she will need to purchase for compression bandaging.    [name] Home Exercise Plan  LE exercises to promote lymphatic circulation.    Consulted and Agree with Plan of Care  Patient       Patient will benefit from skilled therapeutic intervention in order to  improve the following deficits and impairments:  Decreased skin integrity, Increased edema, Difficulty walking  Visit Diagnosis: Lymphedema, not elsewhere classified  Open wound of left lower leg with complication, initial encounter  Pain in left leg     Problem List Patient Active Problem List   Diagnosis Date Noted  .  Venous stasis of both lower extremities 01/13/2019  . Generalized anxiety disorder 11/07/2018  . Non-rheumatic mitral regurgitation 03/20/2017  . Thrombocytopenia (HCC) 09/14/2016  . Hemoglobin decreased 09/14/2016  . Hearing loss 01/06/2015  . Major depressive disorder, recurrent episode, moderate (HCC) 06/07/2014  . Thoracic aorta atherosclerosis (HCC) 03/04/2014  . Degenerative arthritis of thoracic spine 03/04/2014  . Osteoporosis, post-menopausal 08/12/2013  . Metabolic syndrome 06/23/2013  . Anemia, iron deficiency 03/16/2013  . Vitamin D deficiency 03/16/2013  . HTN (hypertension) 03/16/2013  . Hypothyroidism   . Diaphragmatic hernia without mention of obstruction or gangrene   . GERD (gastroesophageal reflux disease)   . Hyperlipidemia   . Prolapse of vaginal walls without mention of uterine prolapse   . Arthritis   . First degree atrioventricular block   . Symptomatic menopausal or female climacteric states   . AF (atrial fibrillation) (HCC)   . Rectal polyp   . Gastric polyp    [name], [name] 01/23/2019, 3:12 PM  [facility] [address] [place], [place], [address] Phone: [phone]   Fax:  [phone]  Name: BLAYKLEE MABLE MRN: [id number] Date of Birth: 08-Jun-1932

## 2019-01-26 ENCOUNTER — Ambulatory Visit (HOSPITAL_COMMUNITY): Payer: Medicare Other | Admitting: Physical Therapy

## 2019-01-26 ENCOUNTER — Other Ambulatory Visit: Payer: Self-pay

## 2019-01-26 DIAGNOSIS — S81802A Unspecified open wound, left lower leg, initial encounter: Secondary | ICD-10-CM | POA: Diagnosis not present

## 2019-01-26 DIAGNOSIS — M79605 Pain in left leg: Secondary | ICD-10-CM

## 2019-01-26 DIAGNOSIS — I890 Lymphedema, not elsewhere classified: Secondary | ICD-10-CM | POA: Diagnosis not present

## 2019-01-26 NOTE — Therapy (Signed)
[facility] [address] [place], [place], [address] Phone: [phone]   Fax:  [phone]  Physical Therapy Treatment  Patient Details  Name: [name] MRN: [id number] Date of Birth: October 18, 1932 Referring Provider (PT): Delynn Flavin   Encounter Date: 01/26/2019  PT End of Session - 01/26/19 1704    Visit Number  2    Number of Visits  18    Date for PT Re-Evaluation  03/06/19    Authorization Type  [employer] medicare/medicaid    PT Start Time  1345    PT Stop Time  1445    PT Time Calculation (min)  60 min    Activity Tolerance  Patient tolerated treatment well    Behavior During Therapy  [facility] for tasks assessed/performed       Past Medical History:  Diagnosis Date  . AF (atrial fibrillation) (HCC)   . Arthritis   . Diaphragmatic hernia without mention of obstruction or gangrene   . Esophagitis   . First degree atrioventricular block   . Gastric polyp   . GERD (gastroesophageal reflux disease)   . Heme positive stool   . Hyperlipidemia   . Hypertension   . Hypothyroid   . Prolapse of vaginal walls without mention of uterine prolapse   . Rectal polyp     Past Surgical History:  Procedure Laterality Date  . DILATION AND CURETTAGE OF UTERUS    . KIDNEY STONE SURGERY      There were no vitals filed for this visit.                  Wound Therapy - 01/26/19 1555    Subjective  see above    Patient and Family Stated Goals  wounds to heal weeping to stop.     Date of Onset  12/02/18   for wounds; edema for months   Prior Treatments  antibiotic, MD has bandaged several times.     Pain Score  6     Pain Type  Chronic pain    Pain Location  Leg    Pain Descriptors / Indicators  Burning    Evaluation and Treatment Procedures Explained to Patient/Family  Yes    Evaluation and Treatment Procedures  agreed to    Wound Properties Date First Assessed: 01/23/19 Time First Assessed: 1059 Wound Type: Venous  stasis ulcer;Other (Comment) Location: Leg Location Orientation: Left;Lateral;Posterior Wound Description (Comments): .6 x .5    Dressing Changed  Changed    Dressing Status  Old drainage;New drainage    Dressing Change Frequency  PRN    Site / Wound Assessment  Granulation tissue;Painful;Yellow    % Wound base Red or Granulating  100%    % Wound base Yellow/Fibrinous Exudate  0%    Peri-wound Assessment  Induration;Erythema (blanchable)    Drainage Amount  Scant    Drainage Description  Serous    Treatment  Cleansed;Debridement (Selective)    Wound Properties Date First Assessed: 01/23/19 Time First Assessed: 1052 Wound Type: Venous stasis ulcer;Other (Comment) Location: Leg Location Orientation: Left;Distal Wound Description (Comments): most inferior wound    Dressing Type  Compression wrap    Dressing Changed  Changed    Dressing Status  Old drainage;New drainage    Dressing Change Frequency  PRN    Site / Wound Assessment  Red;Yellow    % Wound base Red or Granulating  100%    % Wound base Yellow/Fibrinous Exudate  0%  Peri-wound Assessment  Induration;Edema;Erythema (blanchable)    Drainage Amount  Scant    Drainage Description  Serous    Treatment  Cleansed;Debridement (Selective)    Wound Properties Date First Assessed: 01/23/19 Time First Assessed: 1054 Wound Type: Venous stasis ulcer;Other (Comment) Location: Leg Location Orientation: Left;Medial   Dressing Type  Compression wrap    Dressing Changed  Changed    Dressing Status  Old drainage    Dressing Change Frequency  PRN    Site / Wound Assessment  Dry;Painful    % Wound base Red or Granulating  85%    % Wound base Yellow/Fibrinous Exudate  15%    Drainage Amount  Scant    Drainage Description  Serous    Treatment  Cleansed;Debridement (Selective)    Wound Properties Date First Assessed: 01/23/19 Time First Assessed: 1056 Wound Type: Venous stasis ulcer , complicated by lymphedema  Location: Leg Location Orientation:  Left;Lateral Wound Description (Comments): 3 wounds most superior  Present on Admission: Yes   Dressing Type  Compression wrap    Dressing Changed  Changed    Dressing Status  Old drainage;New drainage    Dressing Change Frequency  PRN    Site / Wound Assessment  Painful;Red    % Wound base Red or Granulating  100%    % Wound base Yellow/Fibrinous Exudate  0%    Peri-wound Assessment  Induration;Erythema (blanchable);Edema    Drainage Amount  Minimal    Drainage Description  Serous    Treatment  Cleansed;Debridement (Selective)    Selective Debridement - Location  wounds and dry skin around wounds     Selective Debridement - Tools Used  Forceps    Selective Debridement - Tissue Removed  slough and dry skin     Wound Therapy - Clinical Statement  see Assessment    Wound Therapy - Functional Problem List  difficuclty walking, difficulty wearing clothes due to weeping of wound.    Factors Delaying/Impairing Wound Healing  Vascular compromise;Other (comment)   chronic venous insufficiency, lymphedema, CHF   Hydrotherapy Plan  Debridement;Dressing change;Patient/family education;Other (comment)   lymphedema treatment    Wound Therapy - Frequency  3X / week    Wound Therapy - Current Recommendations  PT    Wound Plan  PT to be seen for debridement and wound care as well as lymphedema treatment .     Dressing   short stretch banding (see lymph section)       [facility] Adult PT Treatment/Exercise - 01/26/19 0001      Manual Therapy   Manual Therapy  Compression Bandaging    Manual therapy comments  foam cut and educated on bandaging    Compression Bandaging  1/2" foam and multilayer short stretch toes to knee bilaterally             PT Education - 01/26/19 1600    Education Details  what bandaging to order.  Explanation of how the bandages will work to reduce induration in LE and need for full LE care.    Person(s) Educated  Patient    Methods  Explanation    Comprehension   Verbalized understanding       PT Short Term Goals - 01/23/19 1502      PT SHORT TERM GOAL #1   Title  Wounds on Lt LE to no longer be weeping    Time  3    Period  Weeks    Status  New    Target Date  02/13/19      PT SHORT TERM GOAL #2   Title  wounds to be 100% granulated    Time  2    Period  Weeks    Status  New    Target Date  02/06/19      PT SHORT TERM GOAL #3   Title  Pt wounds on her Lt LE to be decreased by 50% in size and there to no longer be any blisters on pt's Rt LE    Time  3    Period  Weeks    Status  New    Target Date  02/13/19        PT Long Term Goals - 01/23/19 1504      PT LONG TERM GOAL #1   Title  All wounds to be healed    Time  6    Period  Weeks    Status  New    Target Date  03/06/19      PT LONG TERM GOAL #2   Title  Pt to have decreased 3-4 cm in volume B to allow pain to decrease to no greater than a 2/10 to be able to complete household tasks without resting    Time  6    Period  Weeks      PT LONG TERM GOAL #3   Title  PT to be able to don shoes and socks without difficulty    Time  6    Period  Weeks    Status  New      PT LONG TERM GOAL #4   Title  PT to have obtained and be using a compression pump,(order sent to MD today)    Time  6    Period  Weeks    Status  New      PT LONG TERM GOAL #5   Title  PT to have and be able to don and doff compression garment and to be able to verbalize she is to wear them everyday.    Time  6    Period  Weeks    Status  New            Plan - 01/26/19 1704    Clinical Impression Statement  pt returns today with profore dressings intact.  Explained and given written instructions for additional bandaging she needs to order (8, 12" short stretch and 4, 6" idealbinde) and possible locations to order from.  pt verbalized she would do this asap. Wounds on Lt LE with scant-min drainage and only 3 small areas remain, 100% granlualtion.  Cleansed and debrided dry skin from area,  moisturized well.  Changed dressing to xeroform and used ABD pad and kling to hold into place.  Foam was cut and short stretch was used to bandage this session knee to toe.  Did not use toebandaging this session but may add if noted increase in edema.  Pt reported overall comfort with bandaging.  Explained wear time with patient and to remove and re-roll prior to next appointment.  Pt verbalized undersanding of all instructions.    Personal Factors and Comorbidities  Age;Comorbidity 3+    Comorbidities  CHF, CVI lymphedema    Examination-Activity Limitations  Dressing;Hygiene/Grooming;Stand;Locomotion Level;Other    Examination-Participation Restrictions  Community Activity    Stability/Clinical Decision Making  Evolving/Moderate complexity    Rehab Potential  Good    PT Frequency  3x / week    PT Duration  6 weeks  PT Treatment/Interventions  ADLs/Self Care Home Management;Manual techniques;Manual lymph drainage;Compression bandaging;Other (comment)   debridment   PT Next Visit Plan  Re-measure for reduction next visit.  Begin bandaging full thigh when short stretch obtained. F/U to ensure pateint has ordered bandaging.    PT Home Exercise Plan  LE exercises to promote lymphatic circulation.    Consulted and Agree with Plan of Care  Patient       Patient will benefit from skilled therapeutic intervention in order to improve the following deficits and impairments:  Decreased skin integrity, Increased edema, Difficulty walking  Visit Diagnosis: Open wound of left lower leg with complication, initial encounter  Pain in left leg  Lymphedema, not elsewhere classified     Problem List Patient Active Problem List   Diagnosis Date Noted  . Venous stasis of both lower extremities 01/13/2019  . Generalized anxiety disorder 11/07/2018  . Non-rheumatic mitral regurgitation 03/20/2017  . Thrombocytopenia ([place]) 09/14/2016  . Hemoglobin decreased 09/14/2016  . Hearing loss 01/06/2015  .  Major depressive disorder, recurrent episode, moderate ([place]) 06/07/2014  . Thoracic aorta atherosclerosis ([place]) 03/04/2014  . Degenerative arthritis of thoracic spine 03/04/2014  . Osteoporosis, post-menopausal 08/12/2013  . Metabolic syndrome 18/29/9371  . Anemia, iron deficiency 03/16/2013  . Vitamin D deficiency 03/16/2013  . HTN (hypertension) 03/16/2013  . Hypothyroidism   . Diaphragmatic hernia without mention of obstruction or gangrene   . GERD (gastroesophageal reflux disease)   . Hyperlipidemia   . Prolapse of vaginal walls without mention of uterine prolapse   . Arthritis   . First degree atrioventricular block   . Symptomatic menopausal or female climacteric states   . AF (atrial fibrillation) ([place])   . Rectal polyp   . Gastric polyp    Teena Irani, PTA/CLT [phone]  Teena Irani 01/26/2019, 5:11 PM  [place], [place], [address] Phone: [phone]   Fax:  [phone]  Name: [name] MRN: [id number] Date of Birth: 1932/08/07

## 2019-01-28 ENCOUNTER — Ambulatory Visit (HOSPITAL_COMMUNITY): Payer: Medicare Other | Admitting: Physical Therapy

## 2019-01-28 ENCOUNTER — Other Ambulatory Visit: Payer: Self-pay

## 2019-01-28 DIAGNOSIS — M79605 Pain in left leg: Secondary | ICD-10-CM

## 2019-01-28 DIAGNOSIS — S81802A Unspecified open wound, left lower leg, initial encounter: Secondary | ICD-10-CM

## 2019-01-28 DIAGNOSIS — I890 Lymphedema, not elsewhere classified: Secondary | ICD-10-CM | POA: Diagnosis not present

## 2019-01-28 NOTE — Therapy (Signed)
[place], [place], [address] Phone: [phone]   Fax:  [phone]  Physical Therapy Treatment  Patient Details  Name: [name] MRN: [id number] Date of Birth: 10-Jan-1933 Referring Provider (PT): Ronnie Doss   Encounter Date: 01/28/2019  PT End of Session - 01/28/19 1539    Visit Number  3    Number of Visits  18    Date for PT Re-Evaluation  03/06/19    Authorization Type  [employer] medicare/medicaid    PT Start Time  1540    PT Stop Time  1648    PT Time Calculation (min)  68 min    Activity Tolerance  Patient tolerated treatment well    Behavior During Therapy  [facility] for tasks assessed/performed       Past Medical History:  Diagnosis Date  . AF (atrial fibrillation) ([place])   . Arthritis   . Diaphragmatic hernia without mention of obstruction or gangrene   . Esophagitis   . First degree atrioventricular block   . Gastric polyp   . GERD (gastroesophageal reflux disease)   . Heme positive stool   . Hyperlipidemia   . Hypertension   . Hypothyroid   . Prolapse of vaginal walls without mention of uterine prolapse   . Rectal polyp     Past Surgical History:  Procedure Laterality Date  . DILATION AND CURETTAGE OF UTERUS    . KIDNEY STONE SURGERY      There were no vitals filed for this visit.  Subjective Assessment - 01/28/19 1704    Subjective  pt states she did not get her bandages ordered as she will need her daughter to help her with this.  Plans on getting it done this week.  STates she removed her bandages last night and her legs continue to hurt her no matter what she's doing.    Currently in Pain?  Yes    Pain Location  Leg    Pain Orientation  Right;Left    Pain Descriptors / Indicators  Burning    Pain Type  Chronic pain            LYMPHEDEMA/ONCOLOGY QUESTIONNAIRE - 01/28/19 1536      What other symptoms do you have   Are you Having Heaviness or Tightness  Yes    Are you  having Pain  Yes    Are you having pitting edema  Yes    Body Site  LE    Is it Hard or Difficult finding clothes that fit  Yes    Stemmer Sign  Yes    Other Symptoms  weeping       Lymphedema Stage   Stage  STAGE 2 SPONTANEOUSLY IRREVERSIBLE      Lymphedema Assessments   Lymphedema Assessments  Lower extremities      Right Lower Extremity Lymphedema   At Groin Measure at Horizontal from Pubic Bone  0 cm    20 cm Proximal to Suprapatella  --   was 59.5 on 10/23   10 cm Proximal to Suprapatella  --   was 57.5    At Midpatella/Popliteal Crease  --   was 52.3   30 cm Proximal to Floor at Lateral Plantar Foot  47 cm   was 47.9   20 cm Proximal to Floor at Lateral Plantar Foot  41 1   was 39.7   10 cm Proximal to Floor at Lateral Malleoli  30 cm   was  29.8   Circumference of ankle/heel  38 cm.   was 37.5   5 cm Proximal to 1st MTP Joint  28 cm   was 28   Across MTP Joint  25.4 cm   was 27   Around Proximal Great Toe  8.2 cm   was 8.5     Left Lower Extremity Lymphedema   20 cm Proximal to Suprapatella  --   was 61cm on 10/23   10 cm Proximal to Suprapatella  --   was 58.6   At Midpatella/Popliteal Crease  --   was 52   30 cm Proximal to Floor at Lateral Plantar Foot  49 cm   was 48   20 cm Proximal to Floor at Lateral Plantar Foot  41.5 cm   was 39.3   10 cm Proximal to Floor at Lateral Malleoli  31 cm   was 31.2   Circumference of ankle/heel  37.5 cm.   was 37.8   5 cm Proximal to 1st MTP Joint  29.3 cm   was 29   Across MTP Joint  27 cm   was 27.5   Around Proximal Great Toe  8.5 cm   was 8.7             Wound Therapy - 01/28/19 1536    Subjective  see above    Patient and Family Stated Goals  wounds to heal weeping to stop.     Date of Onset  12/02/18   for wounds; edema for months   Prior Treatments  antibiotic, MD has bandaged several times.     Evaluation and Treatment Procedures Explained to Patient/Family  Yes    Evaluation and Treatment  Procedures  agreed to    Wound Properties Date First Assessed: 01/23/19 Time First Assessed: 1059 Wound Type: Venous stasis ulcer;Other (Comment) Location: Leg Location Orientation: Left;Lateral;Posterior Wound Description (Comments): .6 x .5    Dressing Status  Old drainage;New drainage    Dressing Change Frequency  PRN    Site / Wound Assessment  Granulation tissue;Painful;Yellow    % Wound base Red or Granulating  100%    % Wound base Yellow/Fibrinous Exudate  0%    Peri-wound Assessment  Induration;Erythema (blanchable)    Wound Length (cm)  0.2 cm    Wound Width (cm)  0.2 cm    Wound Depth (cm)  0.1 cm    Wound Volume (cm^3)  0 cm^3    Wound Surface Area (cm^2)  0.04 cm^2    Drainage Amount  Scant    Drainage Description  Serous    Wound Properties Date First Assessed: 01/23/19 Time First Assessed: 1052 Wound Type: Venous stasis ulcer;Other (Comment) Location: Leg Location Orientation: Left;Distal Wound Description (Comments): most inferior wound    Dressing Type  Compression wrap    Dressing Status  Old drainage;New drainage    Dressing Change Frequency  PRN    Site / Wound Assessment  Red;Yellow    % Wound base Red or Granulating  100%    % Wound base Yellow/Fibrinous Exudate  0%    Peri-wound Assessment  Induration;Edema;Erythema (blanchable)    Wound Length (cm)  0.7 cm    Wound Width (cm)  0.6 cm    Wound Depth (cm)  0.1 cm    Wound Volume (cm^3)  0.04 cm^3    Wound Surface Area (cm^2)  0.42 cm^2    Drainage Amount  Scant    Drainage Description  Serous    Wound  Properties Date First Assessed: 01/23/19 Time First Assessed: 1054 Wound Type: Venous stasis ulcer;Other (Comment) Location: Leg Location Orientation: Left;Medial   Dressing Type  Compression wrap    Dressing Status  Old drainage    Dressing Change Frequency  PRN    Site / Wound Assessment  Dry;Painful    % Wound base Red or Granulating  100%    % Wound base Yellow/Fibrinous Exudate  15%    Wound Length (cm)   0.5 cm    Wound Width (cm)  1.2 cm    Wound Depth (cm)  0 cm    Wound Volume (cm^3)  0 cm^3    Wound Surface Area (cm^2)  0.6 cm^2    Drainage Amount  Scant    Drainage Description  Serous    Treatment  Cleansed;Debridement (Selective)    Wound Properties Date First Assessed: 01/23/19 Time First Assessed: 1056 Wound Type: Venous stasis ulcer , complicated by lymphedema  Location: Leg Location Orientation: Left;Lateral Wound Description (Comments): 3 wounds most superior  Present on Admission: Yes Final Assessment Date: 01/28/19   Dressing Type  Compression wrap    Dressing Status  Old drainage;New drainage    Dressing Change Frequency  PRN    Site / Wound Assessment  Painful;Red    % Wound base Red or Granulating  100%    % Wound base Yellow/Fibrinous Exudate  0%    Peri-wound Assessment  Induration;Erythema (blanchable);Edema    Drainage Amount  Minimal    Drainage Description  Serous    Selective Debridement - Location  wounds and dry skin around wounds     Selective Debridement - Tools Used  Forceps    Selective Debridement - Tissue Removed  slough and dry skin     Wound Therapy - Clinical Statement  see Assessment    Wound Therapy - Functional Problem List  difficuclty walking, difficulty wearing clothes due to weeping of wound.    Factors Delaying/Impairing Wound Healing  Vascular compromise;Other (comment)   chronic venous insufficiency, lymphedema, CHF   Hydrotherapy Plan  Debridement;Dressing change;Patient/family education;Other (comment)   lymphedema treatment    Wound Therapy - Frequency  3X / week    Wound Therapy - Current Recommendations  PT    Wound Plan  PT to be seen for debridement and wound care as well as lymphedema treatment .     Dressing   short stretch banding (see lymph section)       [facility] Adult PT Treatment/Exercise - 01/28/19 0001      Manual Therapy   Manual Therapy  Compression Bandaging;Manual Lymphatic Drainage (MLD);Other (comment)    Manual  therapy comments  MLD completed prior to compression bandaging and following woundcare    Manual Lymphatic Drainage (MLD)  anterior only for bilateral LE routing bil inguinal-axillary anastomosis    Compression Bandaging  1/2" foam and multilayer short stretch toes to knee bilaterally    Other Manual Therapy  measurement               PT Short Term Goals - 01/23/19 1502      PT SHORT TERM GOAL #1   Title  Wounds on Lt LE to no longer be weeping    Time  3    Period  Weeks    Status  New    Target Date  02/13/19      PT SHORT TERM GOAL #2   Title  wounds to be 100% granulated    Time  2  Period  Weeks    Status  New    Target Date  02/06/19      PT SHORT TERM GOAL #3   Title  Pt wounds on her Lt LE to be decreased by 50% in size and there to no longer be any blisters on pt's Rt LE    Time  3    Period  Weeks    Status  New    Target Date  02/13/19        PT Long Term Goals - 01/23/19 1504      PT LONG TERM GOAL #1   Title  All wounds to be healed    Time  6    Period  Weeks    Status  New    Target Date  03/06/19      PT LONG TERM GOAL #2   Title  Pt to have decreased 3-4 cm in volume B to allow pain to decrease to no greater than a 2/10 to be able to complete household tasks without resting    Time  6    Period  Weeks      PT LONG TERM GOAL #3   Title  PT to be able to don shoes and socks without difficulty    Time  6    Period  Weeks    Status  New      PT LONG TERM GOAL #4   Title  PT to have obtained and be using a compression pump,(order sent to MD today)    Time  6    Period  Weeks    Status  New      PT LONG TERM GOAL #5   Title  PT to have and be able to don and doff compression garment and to be able to verbalize she is to wear them everyday.    Time  6    Period  Weeks    Status  New            Plan - 01/28/19 1659    Clinical Impression Statement  pt sates her bandages began to slide down yesterday afternoon so she removed  them.  STates her legs hurt either way and states elevation or dependence also doesnt matter.  Pt sleeps in her recliner and counseled on how this is detrimental to her LE lymphedema.    Began manual lymph drainage, removed remaining dry skin, cleansed and moisturized LE's well prior to rebandaging.  Only 3 wounds remain; 2 on lateral aspect of Lt LE and 1 on medial.  All are less than 1cm with minimal drainage.  LE's are improving overall.  Continued dressing with xerforom and multilayer short stretch.  Encouraged to keep bandagin on longer, until morning of next appt as her volume did not change from last week.  pt verbalized understanding.    Personal Factors and Comorbidities  Age;Comorbidity 3+    Comorbidities  CHF, CVI lymphedema    Examination-Activity Limitations  Dressing;Hygiene/Grooming;Stand;Locomotion Level;Other    Examination-Participation Restrictions  Community Activity    Stability/Clinical Decision Making  Evolving/Moderate complexity    Rehab Potential  Good    PT Frequency  3x / week    PT Duration  6 weeks    PT Treatment/Interventions  ADLs/Self Care Home Management;Manual techniques;Manual lymph drainage;Compression bandaging;Other (comment)   debridment   PT Next Visit Plan  Re-measure for reduction weekly.  Begin bandaging full thigh when short stretch obtained.    PT Home  Exercise Plan  LE exercises to promote lymphatic circulation.    Consulted and Agree with Plan of Care  Patient       Patient will benefit from skilled therapeutic intervention in order to improve the following deficits and impairments:  Decreased skin integrity, Increased edema, Difficulty walking  Visit Diagnosis: Pain in left leg  Open wound of left lower leg with complication, initial encounter  Lymphedema, not elsewhere classified     Problem List Patient Active Problem List   Diagnosis Date Noted  . Venous stasis of both lower extremities 01/13/2019  . Generalized anxiety disorder  11/07/2018  . Non-rheumatic mitral regurgitation 03/20/2017  . Thrombocytopenia ([facility]) 09/14/2016  . Hemoglobin decreased 09/14/2016  . Hearing loss 01/06/2015  . Major depressive disorder, recurrent episode, moderate ([facility]) 06/07/2014  . Thoracic aorta atherosclerosis ([facility]) 03/04/2014  . Degenerative arthritis of thoracic spine 03/04/2014  . Osteoporosis, post-menopausal 08/12/2013  . Metabolic syndrome 06/23/2013  . Anemia, iron deficiency 03/16/2013  . Vitamin D deficiency 03/16/2013  . HTN (hypertension) 03/16/2013  . Hypothyroidism   . Diaphragmatic hernia without mention of obstruction or gangrene   . GERD (gastroesophageal reflux disease)   . Hyperlipidemia   . Prolapse of vaginal walls without mention of uterine prolapse   . Arthritis   . First degree atrioventricular block   . Symptomatic menopausal or female climacteric states   . AF (atrial fibrillation) (HCC)   . Rectal polyp   . Gastric polyp    Lurena Nida, PTA/CLT [phone]  Lurena Nida 01/28/2019, 5:14 PM  [facility] [address] [place], [place], [address] Phone: [phone]   Fax:  [phone]  Name: BRYTNEY SOMES MRN: [id number] Date of Birth: May 25, 1932

## 2019-01-30 ENCOUNTER — Ambulatory Visit (HOSPITAL_COMMUNITY): Payer: Medicare Other | Admitting: Physical Therapy

## 2019-01-30 ENCOUNTER — Encounter (HOSPITAL_COMMUNITY): Payer: Self-pay | Admitting: Physical Therapy

## 2019-01-30 ENCOUNTER — Other Ambulatory Visit: Payer: Self-pay

## 2019-01-30 DIAGNOSIS — M79605 Pain in left leg: Secondary | ICD-10-CM | POA: Diagnosis not present

## 2019-01-30 DIAGNOSIS — S81802A Unspecified open wound, left lower leg, initial encounter: Secondary | ICD-10-CM

## 2019-01-30 DIAGNOSIS — I890 Lymphedema, not elsewhere classified: Secondary | ICD-10-CM

## 2019-01-30 NOTE — Therapy (Signed)
[facility] [address] [place], [place], [address] Phone: [phone]   Fax:  [phone]  Physical Therapy Treatment  Patient Details  Name: [name] CzechBessie F Vidrio MRN: [id number] Date of Birth: [birth year] Referring Provider (PT): Delynn FlavinAshly Gottschalk   Encounter Date: 01/30/2019  PT End of Session - 01/30/19 1143    Visit Number  4    Number of Visits  18    Date for PT Re-Evaluation  03/06/19    Authorization Type  [employer]HC medicare/medicaid    PT Start Time  1000    PT Stop Time  1130    PT Time Calculation (min)  90 min    Activity Tolerance  Patient tolerated treatment well    Behavior During Therapy  [facility] for tasks assessed/performed       Past Medical History:  Diagnosis Date  . AF (atrial fibrillation) (HCC)   . Arthritis   . Diaphragmatic hernia without mention of obstruction or gangrene   . Esophagitis   . First degree atrioventricular block   . Gastric polyp   . GERD (gastroesophageal reflux disease)   . Heme positive stool   . Hyperlipidemia   . Hypertension   . Hypothyroid   . Prolapse of vaginal walls without mention of uterine prolapse   . Rectal polyp     Past Surgical History:  Procedure Laterality Date  . DILATION AND CURETTAGE OF UTERUS    . KIDNEY STONE SURGERY      There were no vitals filed for this visit.  Subjective Assessment - 01/30/19 1138    Subjective  Pt states that she has not purchased the extra short stretch bandages at this point due to not having a computer, pt states that her son will have to order them.    Patient is accompained by:  Family member    Pertinent History  CHF, CVI    Limitations  Standing    How long can you sit comfortably?  no problem    How long can you stand comfortably?  5-10 min    How long can you walk comfortably?  Pt does not walk much    Currently in Pain?  Yes    Pain Score  5     Pain Location  Leg    Pain Orientation  Left    Pain Descriptors / Indicators   Aching    Pain Onset  1 to 4 weeks ago               [facility]/Exercise - 01/30/19 0001      Manual Therapy   Manual Therapy  Compression Bandaging;Manual Lymphatic Drainage (MLD);Other (comment)    Manual therapy comments  LE cleansed and moisturized prior to treatment.     Manual Lymphatic Drainage (MLD)  anterior completed supine; posterior completed sidelying to include supraclavicular, deep and superficial abdominal with bilateral inguinal-axillary anastomosis followed by LE     Compression Bandaging  1/2" foam and multilayer short stretch toes to knee bilaterally               PT Short Term Goals - 01/30/19 1325      PT SHORT TERM GOAL #1   Title  Wounds on Lt LE to no longer be weeping    Time  3    Period  Weeks    Status  Achieved    Target Date  02/13/19      PT SHORT TERM GOAL #2  Title  wounds to be 100% granulated    Time  2    Period  Weeks    Status  Achieved    Target Date  02/06/19      PT SHORT TERM GOAL #3   Title  Pt wounds on her Lt LE to be decreased by 50% in size and there to no longer be any blisters on pt's Rt LE    Time  3    Period  Weeks    Status  Achieved    Target Date  02/13/19        PT Long Term Goals - 01/30/19 1145      PT LONG TERM GOAL #1   Title  All wounds to be healed    Time  6    Period  Weeks    Status  On-going      PT LONG TERM GOAL #2   Title  Pt to have decreased 3-4 cm in volume B to allow pain to decrease to no greater than a 2/10 to be able to complete household tasks without resting    Time  6    Period  Weeks    Status  On-going      PT LONG TERM GOAL #3   Title  PT to be able to don shoes and socks without difficulty    Time  6    Period  Weeks    Status  On-going      PT LONG TERM GOAL #4   Title  PT to have obtained and be using a compression pump,(order sent to MD today)    Time  6    Period  Weeks    Status  On-going      PT LONG TERM GOAL #5   Title  PT to  have and be able to don and doff compression garment and to be able to verbalize she is to wear them everyday.    Time  6    Period  Weeks    Status  New            Plan - 01/30/19 1143    Clinical Impression Statement  PT only has two small wounds which  are both smaller than .5x.5 with  no drainage.  Pt Rt LE is painfree now but LT LE continues to have pain.  PT has significant induration B and will continue to benefit from skilled PT to decongest B LE>    Personal Factors and Comorbidities  Age;Comorbidity 3+    Comorbidities  CHF, CVI lymphedema    Examination-Activity Limitations  Dressing;Hygiene/Grooming;Stand;Locomotion Level;Other    Examination-Participation Restrictions  Community Activity    Stability/Clinical Decision Making  Evolving/Moderate complexity    Rehab Potential  Good    PT Frequency  3x / week    PT Duration  6 weeks    PT Treatment/Interventions  ADLs/Self Care Home Management;Manual techniques;Manual lymph drainage;Compression bandaging;Other (comment)   debridment   PT Next Visit Plan  Begin bandaging full thigh when short stretch obtained.    PT Home Exercise Plan  LE exercises to promote lymphatic circulation.    Consulted and Agree with Plan of Care  Patient       Patient will benefit from skilled therapeutic intervention in order to improve the following deficits and impairments:  Decreased skin integrity, Increased edema, Difficulty walking  Visit Diagnosis: Pain in left leg  Lymphedema, not elsewhere classified  Open wound of left lower leg  with complication, initial encounter     Problem List Patient Active Problem List   Diagnosis Date Noted  . Venous stasis of both lower extremities 01/13/2019  . Generalized anxiety disorder 11/07/2018  . Non-rheumatic mitral regurgitation 03/20/2017  . Thrombocytopenia (HCC) 09/14/2016  . Hemoglobin decreased 09/14/2016  . Hearing loss 01/06/2015  . Major depressive disorder, recurrent episode,  moderate (HCC) 06/07/2014  . Thoracic aorta atherosclerosis (HCC) 03/04/2014  . Degenerative arthritis of thoracic spine 03/04/2014  . Osteoporosis, post-menopausal 08/12/2013  . Metabolic syndrome 06/23/2013  . Anemia, iron deficiency 03/16/2013  . Vitamin D deficiency 03/16/2013  . HTN (hypertension) 03/16/2013  . Hypothyroidism   . Diaphragmatic hernia without mention of obstruction or gangrene   . GERD (gastroesophageal reflux disease)   . Hyperlipidemia   . Prolapse of vaginal walls without mention of uterine prolapse   . Arthritis   . First degree atrioventricular block   . Symptomatic menopausal or female climacteric states   . AF (atrial fibrillation) (HCC)   . Rectal polyp   . Gastric polyp     Virgina Organ, PT CLT [phone] 01/30/2019, 1:27 PM  [facility] [address] [place], [place], [address] Phone: [phone]   Fax:  [phone]  Name: SETSUKO ROBINS MRN: [id number] Date of Birth: 1932-07-28

## 2019-02-04 ENCOUNTER — Other Ambulatory Visit: Payer: Self-pay

## 2019-02-04 ENCOUNTER — Ambulatory Visit (HOSPITAL_COMMUNITY): Payer: Medicare Other | Attending: Family Medicine | Admitting: Physical Therapy

## 2019-02-04 ENCOUNTER — Encounter (HOSPITAL_COMMUNITY): Payer: Self-pay | Admitting: Physical Therapy

## 2019-02-04 DIAGNOSIS — I890 Lymphedema, not elsewhere classified: Secondary | ICD-10-CM | POA: Diagnosis not present

## 2019-02-04 DIAGNOSIS — M79605 Pain in left leg: Secondary | ICD-10-CM | POA: Insufficient documentation

## 2019-02-04 DIAGNOSIS — S81802A Unspecified open wound, left lower leg, initial encounter: Secondary | ICD-10-CM | POA: Diagnosis not present

## 2019-02-04 NOTE — Therapy (Signed)
[facility] [address] [place], [place], [address] Phone: [phone]   Fax:  [phone]  Physical Therapy Treatment  Patient Details  Name: [name] CzechBessie F Le MRN: [id number] Date of Birth: 1932/12/14 Referring Provider (PT): Delynn FlavinAshly Gottschalk   Encounter Date: 02/04/2019  PT End of Session - 02/04/19 1624    Visit Number  5    Number of Visits  18    Date for PT Re-Evaluation  03/06/19    Authorization Type  [employer]HC medicare/medicaid    PT Start Time  1455    PT Stop Time  1615    PT Time Calculation (min)  80 min    Activity Tolerance  Patient tolerated treatment well    Behavior During Therapy  [facility] for tasks assessed/performed       Past Medical History:  Diagnosis Date  . AF (atrial fibrillation) (HCC)   . Arthritis   . Diaphragmatic hernia without mention of obstruction or gangrene   . Esophagitis   . First degree atrioventricular block   . Gastric polyp   . GERD (gastroesophageal reflux disease)   . Heme positive stool   . Hyperlipidemia   . Hypertension   . Hypothyroid   . Prolapse of vaginal walls without mention of uterine prolapse   . Rectal polyp     Past Surgical History:  Procedure Laterality Date  . DILATION AND CURETTAGE OF UTERUS    . KIDNEY STONE SURGERY      There were no vitals filed for this visit.  Subjective Assessment - 02/04/19 1619    Subjective  PT's family still has not ordered short stretch, pt has no computer.    Patient is accompained by:  Family member    Pertinent History  CHF, CVI    Limitations  Standing    How long can you sit comfortably?  no problem    How long can you stand comfortably?  5-10 min    How long can you walk comfortably?  Pt does not walk much    Currently in Pain?  Yes    Pain Score  5     Pain Location  Leg    Pain Orientation  Left    Pain Descriptors / Indicators  Aching;Heaviness    Pain Type  Chronic pain    Pain Onset  1 to 4 weeks ago    Pain Frequency   Intermittent    Aggravating Factors   walking    Pain Relieving Factors  elevation    Effect of Pain on Daily Activities  limits           [facility] Adult PT Treatment/Exercise - 02/04/19 0001      Manual Therapy   Manual Therapy  Compression Bandaging;Manual Lymphatic Drainage (MLD);Other (comment)    Manual therapy comments  LE cleansed and moisturized prior to treatment.     Manual Lymphatic Drainage (MLD)  anterior completed supine; posterior completed sidelying to include supraclavicular, deep and superficial abdominal with bilateral inguinal-axillary anastomosis followed by LE     Compression Bandaging  1/2" foam and multilayer short stretch toes to knee bilaterally               PT Short Term Goals - 02/04/19 1631      PT SHORT TERM GOAL #1   Title  Wounds on Lt LE to no longer be weeping    Time  3    Period  Weeks    Status  Achieved    Target Date  02/13/19      PT SHORT TERM GOAL #2   Title  wounds to be 100% granulated    Time  2    Period  Weeks    Status  Achieved    Target Date  02/06/19      PT SHORT TERM GOAL #3   Title  Pt wounds on her Lt LE to be decreased by 50% in size and there to no longer be any blisters on pt's Rt LE    Time  3    Period  Weeks    Status  Achieved    Target Date  02/13/19        PT Long Term Goals - 02/04/19 1631      PT LONG TERM GOAL #1   Title  All wounds to be healed    Time  6    Period  Weeks    Status  Achieved      PT LONG TERM GOAL #2   Title  Pt to have decreased 3-4 cm in volume B to allow pain to decrease to no greater than a 2/10 to be able to complete household tasks without resting    Time  6    Period  Weeks    Status  On-going      PT LONG TERM GOAL #3   Title  PT to be able to don shoes and socks without difficulty    Time  6    Period  Weeks    Status  On-going      PT LONG TERM GOAL #4   Title  PT to have obtained and be using a compression pump,(order sent to MD today)    Time  6     Period  Weeks    Status  On-going      PT LONG TERM GOAL #5   Title  PT to have and be able to don and doff compression garment and to be able to verbalize she is to wear them everyday.    Time  6    Period  Weeks    Status  New            Plan - 02/04/19 1624    Clinical Impression Statement  All wounds are currently healed.  Pt continues to have induration in distal thighs B especially in posterior aspect.  Pt has not heard from [facility] at this time.  Therapist called [facility] has sent paperwork to MD pt covered at 100%.    Personal Factors and Comorbidities  Age;Comorbidity 3+    Comorbidities  CHF, CVI lymphedema    Examination-Activity Limitations  Dressing;Hygiene/Grooming;Stand;Locomotion Level;Other    Examination-Participation Restrictions  Community Activity    Stability/Clinical Decision Making  Evolving/Moderate complexity    Rehab Potential  Good    PT Frequency  3x / week    PT Duration  6 weeks    PT Treatment/Interventions  ADLs/Self Care Home Management;Manual techniques;Manual lymph drainage;Compression bandaging;Other (comment)   debridment   PT Next Visit Plan  Begin bandaging full thigh when short stretch obtained.  Continue with complete decongestive techniques; measure on Friday    PT Home Exercise Plan  LE exercises to promote lymphatic circulation.    Consulted and Agree with Plan of Care  Patient       Patient will benefit from skilled therapeutic intervention in order to improve the following deficits and impairments:  Decreased skin integrity, Increased  edema, Difficulty walking  Visit Diagnosis: Pain in left leg  Lymphedema, not elsewhere classified     Problem List Patient Active Problem List   Diagnosis Date Noted  . Venous stasis of both lower extremities 01/13/2019  . Generalized anxiety disorder 11/07/2018  . Non-rheumatic mitral regurgitation 03/20/2017  . Thrombocytopenia (HCC) 09/14/2016  . Hemoglobin decreased 09/14/2016  .  Hearing loss 01/06/2015  . Major depressive disorder, recurrent episode, moderate (HCC) 06/07/2014  . Thoracic aorta atherosclerosis (HCC) 03/04/2014  . Degenerative arthritis of thoracic spine 03/04/2014  . Osteoporosis, post-menopausal 08/12/2013  . Metabolic syndrome 06/23/2013  . Anemia, iron deficiency 03/16/2013  . Vitamin D deficiency 03/16/2013  . HTN (hypertension) 03/16/2013  . Hypothyroidism   . Diaphragmatic hernia without mention of obstruction or gangrene   . GERD (gastroesophageal reflux disease)   . Hyperlipidemia   . Prolapse of vaginal walls without mention of uterine prolapse   . Arthritis   . First degree atrioventricular block   . Symptomatic menopausal or female climacteric states   . AF (atrial fibrillation) (HCC)   . Rectal polyp   . Gastric polyp     Virgina Organ, PT CLT [phone] 02/04/2019, 4:32 PM  [facility] [address] [place], [place], [address] Phone: [phone]   Fax:  [phone]  Name: [name] MRN: [id number] Date of Birth: Nov 16, 1932

## 2019-02-06 ENCOUNTER — Other Ambulatory Visit: Payer: Self-pay

## 2019-02-06 ENCOUNTER — Encounter (HOSPITAL_COMMUNITY): Payer: Self-pay | Admitting: Physical Therapy

## 2019-02-06 ENCOUNTER — Ambulatory Visit (HOSPITAL_COMMUNITY): Payer: Medicare Other | Admitting: Physical Therapy

## 2019-02-06 DIAGNOSIS — I890 Lymphedema, not elsewhere classified: Secondary | ICD-10-CM

## 2019-02-06 DIAGNOSIS — M79605 Pain in left leg: Secondary | ICD-10-CM

## 2019-02-06 DIAGNOSIS — S81802A Unspecified open wound, left lower leg, initial encounter: Secondary | ICD-10-CM | POA: Diagnosis not present

## 2019-02-06 NOTE — Therapy (Signed)
[facility] [address] [place], [place], [address] Phone: [phone]   Fax:  [phone]  Physical Therapy Treatment  Patient Details  Name: [name] MRN: [id number] Date of Birth: 30-Mar-1933 Referring Provider (PT): Delynn FlavinAshly Gottschalk   Encounter Date: 02/06/2019  PT End of Session - 02/06/19 1517    Visit Number  6    Number of Visits  18    Date for PT Re-Evaluation  03/06/19   reauth   Authorization Type  [employer] medicare/medicaid    PT Start Time  1400    PT Stop Time  1513    PT Time Calculation (min)  73 min    Activity Tolerance  Patient tolerated treatment well    Behavior During Therapy  [facility] for tasks assessed/performed       Past Medical History:  Diagnosis Date  . AF (atrial fibrillation) (HCC)   . Arthritis   . Diaphragmatic hernia without mention of obstruction or gangrene   . Esophagitis   . First degree atrioventricular block   . Gastric polyp   . GERD (gastroesophageal reflux disease)   . Heme positive stool   . Hyperlipidemia   . Hypertension   . Hypothyroid   . Prolapse of vaginal walls without mention of uterine prolapse   . Rectal polyp     Past Surgical History:  Procedure Laterality Date  . DILATION AND CURETTAGE OF UTERUS    . KIDNEY STONE SURGERY      There were no vitals filed for this visit.   Pt states that she is still having some pain in her Lt LE.   She has not purchased the additional bandages at this time.    No pain at this time    LYMPHEDEMA/ONCOLOGY QUESTIONNAIRE - 02/06/19 1359      Right Lower Extremity Lymphedema   20 cm Proximal to Suprapatella  62.5 cm    10 cm Proximal to Suprapatella  58.7 cm    At Midpatella/Popliteal Crease  50.5 cm    30 cm Proximal to Floor at Lateral Plantar Foot  45.5 cm    20 cm Proximal to Floor at Lateral Plantar Foot  35.7 1    10  cm Proximal to Floor at Lateral Malleoli  30.2 cm    Circumference of ankle/heel  37.8 cm.    5 cm  Proximal to 1st MTP Joint  27.9 cm    Across MTP Joint  25.7 cm      Left Lower Extremity Lymphedema   20 cm Proximal to Suprapatella  63 cm    10 cm Proximal to Suprapatella  59.5 cm    At Midpatella/Popliteal Crease  50.8 cm    30 cm Proximal to Floor at Lateral Plantar Foot  47.7 cm    20 cm Proximal to Floor at Lateral Plantar Foot  38.8 cm    10 cm Proximal to Floor at Lateral Malleoli  32 cm    Circumference of ankle/heel  37.9 cm.    5 cm Proximal to 1st MTP Joint  28.5 cm    Across MTP Joint  27.2 cm    Around Proximal Great Toe  9 cm                [facility] Adult PT Treatment/Exercise - 02/06/19 0001      Manual Therapy   Manual Therapy  Compression Bandaging;Manual Lymphatic Drainage (MLD);Other (comment)    Manual therapy comments  LE cleansed and moisturized  prior to treatment.     Manual Lymphatic Drainage (MLD)  anterior completed supine; posterior completed sidelying to include supraclavicular, deep and superficial abdominal with bilateral inguinal-axillary anastomosis followed by LE     Compression Bandaging  1/2" foam and multilayer short stretch toes to knee bilaterally    Other Manual Therapy  measurement               PT Short Term Goals - 02/04/19 1631      PT SHORT TERM GOAL #1   Title  Wounds on Lt LE to no longer be weeping    Time  3    Period  Weeks    Status  Achieved    Target Date  02/13/19      PT SHORT TERM GOAL #2   Title  wounds to be 100% granulated    Time  2    Period  Weeks    Status  Achieved    Target Date  02/06/19      PT SHORT TERM GOAL #3   Title  Pt wounds on her Lt LE to be decreased by 50% in size and there to no longer be any blisters on pt's Rt LE    Time  3    Period  Weeks    Status  Achieved    Target Date  02/13/19        PT Long Term Goals - 02/04/19 1631      PT LONG TERM GOAL #1   Title  All wounds to be healed    Time  6    Period  Weeks    Status  Achieved      PT LONG TERM GOAL #2    Title  Pt to have decreased 3-4 cm in volume B to allow pain to decrease to no greater than a 2/10 to be able to complete household tasks without resting    Time  6    Period  Weeks    Status  On-going      PT LONG TERM GOAL #3   Title  PT to be able to don shoes and socks without difficulty    Time  6    Period  Weeks    Status  On-going      PT LONG TERM GOAL #4   Title  PT to have obtained and be using a compression pump,(order sent to MD today)    Time  6    Period  Weeks    Status  On-going      PT LONG TERM GOAL #5   Title  PT to have and be able to don and doff compression garment and to be able to verbalize she is to wear them everyday.    Time  6    Period  Weeks    Status  New            Plan - 02/06/19 1517    Clinical Impression Statement  Noted weeping from Lt lateral LE, Pt did not don compression garment when she took her compression bandaging off.  Noted increased induration in Lt LE as well as LT thigh.  Pt encouraged to done compression garment after taking bandages off so that there is always some compression on her LE.  PT also encouraged to purchase additional short stretch bandages so that we may begin bandaging to thigh level.   Added toe bandaging due to increased edema in toes.    Personal  Factors and Comorbidities  Age;Comorbidity 3+    Comorbidities  CHF, CVI lymphedema    Examination-Activity Limitations  Dressing;Hygiene/Grooming;Stand;Locomotion Level;Other    Examination-Participation Restrictions  Community Activity    Stability/Clinical Decision Making  Evolving/Moderate complexity    Rehab Potential  Good    PT Frequency  3x / week    PT Duration  6 weeks    PT Treatment/Interventions  ADLs/Self Care Home Management;Manual techniques;Manual lymph drainage;Compression bandaging;Other (comment)   debridment   PT Next Visit Plan  Begin bandaging full thigh when short stretch obtained.  Continue with complete decongestive techniques    PT Home  Exercise Plan  LE exercises to promote lymphatic circulation.    Consulted and Agree with Plan of Care  Patient       Patient will benefit from skilled therapeutic intervention in order to improve the following deficits and impairments:  Decreased skin integrity, Increased edema, Difficulty walking  Visit Diagnosis: Pain in left leg  Lymphedema, not elsewhere classified     Problem List Patient Active Problem List   Diagnosis Date Noted  . Venous stasis of both lower extremities 01/13/2019  . Generalized anxiety disorder 11/07/2018  . Non-rheumatic mitral regurgitation 03/20/2017  . Thrombocytopenia (HCC) 09/14/2016  . Hemoglobin decreased 09/14/2016  . Hearing loss 01/06/2015  . Major depressive disorder, recurrent episode, moderate (HCC) 06/07/2014  . Thoracic aorta atherosclerosis (HCC) 03/04/2014  . Degenerative arthritis of thoracic spine 03/04/2014  . Osteoporosis, post-menopausal 08/12/2013  . Metabolic syndrome 06/23/2013  . Anemia, iron deficiency 03/16/2013  . Vitamin D deficiency 03/16/2013  . HTN (hypertension) 03/16/2013  . Hypothyroidism   . Diaphragmatic hernia without mention of obstruction or gangrene   . GERD (gastroesophageal reflux disease)   . Hyperlipidemia   . Prolapse of vaginal walls without mention of uterine prolapse   . Arthritis   . First degree atrioventricular block   . Symptomatic menopausal or female climacteric states   . AF (atrial fibrillation) (HCC)   . Rectal polyp   . Gastric polyp     Virgina Organ, PT CLT [phone] 02/06/2019, 3:24 PM  [facility] [address] [place], [place], [address] Phone: [phone]   Fax:  [phone]  Name: [name] MRN: [id number] Date of Birth: [birth year]

## 2019-02-11 ENCOUNTER — Encounter (HOSPITAL_COMMUNITY): Payer: Self-pay | Admitting: Physical Therapy

## 2019-02-11 ENCOUNTER — Ambulatory Visit (HOSPITAL_COMMUNITY): Payer: Medicare Other | Admitting: Physical Therapy

## 2019-02-11 ENCOUNTER — Other Ambulatory Visit: Payer: Self-pay

## 2019-02-11 DIAGNOSIS — I890 Lymphedema, not elsewhere classified: Secondary | ICD-10-CM | POA: Diagnosis not present

## 2019-02-11 DIAGNOSIS — S81802A Unspecified open wound, left lower leg, initial encounter: Secondary | ICD-10-CM | POA: Diagnosis not present

## 2019-02-11 DIAGNOSIS — M79605 Pain in left leg: Secondary | ICD-10-CM | POA: Diagnosis not present

## 2019-02-11 NOTE — Therapy (Signed)
[facility] [address] [place], [place], [address] Phone: [phone]   Fax:  [phone]  Physical Therapy Treatment  Patient Details  Name: [name] MRN: [id number] Date of Birth: 10/30/1932 Referring Provider (PT): Ashly Gottschalk   Encounter Date: 02/11/2019  PT End of Session - 02/11/19 1634    Visit Number  7    Number of Visits  18    Date for PT Re-Evaluation  03/06/19   reauth   Authorization Type  [employer] medicare/medicaid    Authorization - Visit Number  7    Authorization - Number of Visits  10    PT Start Time  1410    PT Stop Time  1530    PT Time Calculation (min)  80 min    Activity Tolerance  Patient tolerated treatment well    Behavior During Therapy  [facility]FL for tasks assessed/performed       Past Medical History:  Diagnosis Date  . AF (atrial fibrillation) (HCC)   . Arthritis   . Diaphragmatic hernia without mention of obstruction or gangrene   . Esophagitis   . First degree atrioventricular block   . Gastric polyp   . GERD (gastroesophageal reflux disease)   . Heme positive stool   . Hyperlipidemia   . Hypertension   . Hypothyroid   . Prolapse of vaginal walls without mention of uterine prolapse   . Rectal polyp     Past Surgical History:  Procedure Laterality Date  . DILATION AND CURETTAGE OF UTERUS    . KIDNEY STONE SURGERY      There were no vitals filed for this visit.  Subjective Assessment - 02/11/19 1631    Subjective  PT has not  been to therapy since Friday, pt states that she was not scheduled Monday or Tuesday.  Compression Bandaging became loose on Monday and has been off since then.  Pt states that she has not answered her phone if she does not recognize the number.  Therapist explained that the therapist contacted [facility] Friday afternoon and the pt is 100% covered for garments and pump and [facility] has been trying to reach her.    Patient is accompained by:  Family member     Pertinent History  CHF, CVI    Limitations  Standing    How long can you sit comfortably?  no problem    How long can you stand comfortably?  5-10 min    How long can you walk comfortably?  Pt does not walk much    Currently in Pain?  Yes    Pain Score  5     Pain Location  Leg    Pain Orientation  Left    Pain Descriptors / Indicators  Aching;Throbbing    Pain Type  Chronic pain    Pain Onset  1 to 4 weeks ago    Pain Frequency  Intermittent    Aggravating Factors   dependent postion    Pain Relieving Factors  elevation                       [facility] Adult PT Treatment/Exercise - 02/11/19 0001      Manual Therapy   Manual Therapy  Compression Bandaging;Manual Lymphatic Drainage (MLD);Other (comment)    Manual therapy comments  LE cleansed and moisturized prior to treatment.     Manual Lymphatic Drainage (MLD)  anterior completed supine; posterior completed sidelying to include supraclavicular, deep   and superficial abdominal with bilateral inguinal-axillary anastomosis followed by LE     Compression Bandaging  1/2" foam and multilayer short stretch toes to knee bilaterally               PT Short Term Goals - 02/11/19 1637      PT SHORT TERM GOAL #1   Title  Wounds on Lt LE to no longer be weeping    Time  3    Period  Weeks    Status  Achieved    Target Date  02/13/19      PT SHORT TERM GOAL #2   Title  wounds to be 100% granulated    Time  2    Period  Weeks    Status  Achieved   was achieved but due to not having rx MOn. has blisters.   Target Date  02/06/19      PT SHORT TERM GOAL #3   Title  Pt wounds on her Lt LE to be decreased by 50% in size and there to no longer be any blisters on pt's Rt LE    Time  3    Period  Weeks    Status  On-going   was met but now has new blisters since pt did not have rx Mon.   Target Date  02/13/19        PT Long Term Goals - 02/11/19 1639      PT LONG TERM GOAL #1   Title  All wounds to be healed     Time  6    Period  Weeks    Status  On-going   was achieved now patient has two new wounds     PT LONG TERM GOAL #2   Title  Pt to have decreased 3-4 cm in volume B to allow pain to decrease to no greater than a 2/10 to be able to complete household tasks without resting    Time  6    Period  Weeks    Status  On-going      PT LONG TERM GOAL #3   Title  PT to be able to don shoes and socks without difficulty    Time  6    Period  Weeks    Status  On-going      PT LONG TERM GOAL #4   Title  PT to have obtained and be using a compression pump,(order sent to MD today)    Time  6    Period  Weeks    Status  On-going      PT LONG TERM GOAL #5   Title  PT to have and be able to don and doff compression garment and to be able to verbalize she is to wear them everyday.    Time  6    Period  Weeks    Status  New            Plan - 02/11/19 1635    Clinical Impression Statement  PT now has two new blisters on the lateral aspect of her Lt LE, therapist placed xeroform on these areas prior to compression bandaging.  Pt still does not have short stretch to be able to compression wrap thigh area and there is significant induration in the thighs, therapist explained that this is a vital aspect to her treatement, pt states that she will try and order the bandages.    Personal Factors and Comorbidities  Age;Comorbidity 3+  Comorbidities  CHF, CVI lymphedema    Examination-Activity Limitations  Dressing;Hygiene/Grooming;Stand;Locomotion Level;Other    Examination-Participation Restrictions  Community Activity    Stability/Clinical Decision Making  Evolving/Moderate complexity    Rehab Potential  Good    PT Frequency  3x / week    PT Duration  6 weeks    PT Treatment/Interventions  ADLs/Self Care Home Management;Manual techniques;Manual lymph drainage;Compression bandaging;Other (comment)   debridment   PT Next Visit Plan  Begin bandaging full thigh when short stretch obtained.  Continue  with complete decongestive techniques; measure next visit and assess blisters.    PT Home Exercise Plan  LE exercises to promote lymphatic circulation.    Consulted and Agree with Plan of Care  Patient       Patient will benefit from skilled therapeutic intervention in order to improve the following deficits and impairments:  Decreased skin integrity, Increased edema, Difficulty walking  Visit Diagnosis: Pain in left leg  Lymphedema, not elsewhere classified  Open wound of left lower leg with complication, initial encounter     Problem List Patient Active Problem List   Diagnosis Date Noted  . Venous stasis of both lower extremities 01/13/2019  . Generalized anxiety disorder 11/07/2018  . Non-rheumatic mitral regurgitation 03/20/2017  . Thrombocytopenia (HCC) 09/14/2016  . Hemoglobin decreased 09/14/2016  . Hearing loss 01/06/2015  . Major depressive disorder, recurrent episode, moderate (HCC) 06/07/2014  . Thoracic aorta atherosclerosis (HCC) 03/04/2014  . Degenerative arthritis of thoracic spine 03/04/2014  . Osteoporosis, post-menopausal 08/12/2013  . Metabolic syndrome 06/23/2013  . Anemia, iron deficiency 03/16/2013  . Vitamin D deficiency 03/16/2013  . HTN (hypertension) 03/16/2013  . Hypothyroidism   . Diaphragmatic hernia without mention of obstruction or gangrene   . GERD (gastroesophageal reflux disease)   . Hyperlipidemia   . Prolapse of vaginal walls without mention of uterine prolapse   . Arthritis   . First degree atrioventricular block   . Symptomatic menopausal or female climacteric states   . AF (atrial fibrillation) (HCC)   . Rectal polyp   . Gastric polyp     Cynthia Russell, PT CLT [phone] 02/11/2019, 4:40 PM  [facility] [address] [place], [place], [address] Phone: [phone]   Fax:  [phone]  Name: [name] MRN: [id number] Date of Birth: 05/23/1932   

## 2019-02-13 ENCOUNTER — Other Ambulatory Visit: Payer: Self-pay

## 2019-02-13 ENCOUNTER — Ambulatory Visit (HOSPITAL_COMMUNITY): Payer: Medicare Other | Admitting: Physical Therapy

## 2019-02-13 ENCOUNTER — Encounter (HOSPITAL_COMMUNITY): Payer: Self-pay | Admitting: Physical Therapy

## 2019-02-13 DIAGNOSIS — I890 Lymphedema, not elsewhere classified: Secondary | ICD-10-CM

## 2019-02-13 DIAGNOSIS — S81802A Unspecified open wound, left lower leg, initial encounter: Secondary | ICD-10-CM | POA: Diagnosis not present

## 2019-02-13 DIAGNOSIS — M79605 Pain in left leg: Secondary | ICD-10-CM | POA: Diagnosis not present

## 2019-02-13 NOTE — Therapy (Signed)
[facility] [address] [place], [place], [address] Phone: [phone]   Fax:  [phone]  Physical Therapy Treatment  Patient Details  Name: [name] MRN: [id number] Date of Birth: 10/08/1932 Referring Provider (PT): Ronnie Doss   Encounter Date: 02/13/2019  PT End of Session - 02/13/19 0836    Visit Number  8    Number of Visits  18    Date for PT Re-Evaluation  03/06/19   reauth   Authorization Type  [employer] medicare/medicaid    Authorization - Visit Number  8    Authorization - Number of Visits  10    PT Start Time  0820    PT Stop Time  0940    PT Time Calculation (min)  80 min    Activity Tolerance  Patient tolerated treatment well    Behavior During Therapy  [facility] for tasks assessed/performed       Past Medical History:  Diagnosis Date  . AF (atrial fibrillation) ([place])   . Arthritis   . Diaphragmatic hernia without mention of obstruction or gangrene   . Esophagitis   . First degree atrioventricular block   . Gastric polyp   . GERD (gastroesophageal reflux disease)   . Heme positive stool   . Hyperlipidemia   . Hypertension   . Hypothyroid   . Prolapse of vaginal walls without mention of uterine prolapse   . Rectal polyp     Past Surgical History:  Procedure Laterality Date  . DILATION AND CURETTAGE OF UTERUS    . KIDNEY STONE SURGERY      There were no vitals filed for this visit.  Subjective Assessment - 02/13/19 0835    Subjective  Pt states that [facility] is going to measure her on Wed.  They are coming to the clinic.  Pt took her garments off on lat night.    Patient is accompained by:  Family member    Pertinent History  CHF, CVI    Limitations  Standing    How long can you sit comfortably?  no problem    How long can you stand comfortably?  5-10 min    How long can you walk comfortably?  Pt does not walk much    Currently in Pain?  Yes    Pain Score  4     Pain Location  Leg    Pain  Orientation  Left    Pain Descriptors / Indicators  Throbbing    Pain Type  Chronic pain    Pain Onset  1 to 4 weeks ago    Pain Frequency  Constant    Aggravating Factors   legs down    Pain Relieving Factors  elevation            LYMPHEDEMA/ONCOLOGY QUESTIONNAIRE - 02/13/19 0826      Right Lower Extremity Lymphedema   20 cm Proximal to Suprapatella  61 cm    10 cm Proximal to Suprapatella  58 cm    At Midpatella/Popliteal Crease  49.8 cm    30 cm Proximal to Floor at Lateral Plantar Foot  46 cm    20 cm Proximal to Floor at Lateral Plantar Foot  35 1    10 cm Proximal to Floor at Lateral Malleoli  30 cm    Circumference of ankle/heel  39.8 cm.    5 cm Proximal to 1st MTP Joint  28.7 cm    Across MTP Joint  26.3 cm  Around Proximal Great Toe  8.3 cm      Left Lower Extremity Lymphedema   20 cm Proximal to Suprapatella  60.8 cm    10 cm Proximal to Suprapatella  57.8 cm    At Midpatella/Popliteal Crease  50.5 cm    30 cm Proximal to Floor at Lateral Plantar Foot  47.4 cm    20 cm Proximal to Floor at Lateral Plantar Foot  38.1 cm    10 cm Proximal to Floor at Lateral Malleoli  32.5 cm    Circumference of ankle/heel  37.9 cm.    5 cm Proximal to 1st MTP Joint  29 cm    Across MTP Joint  27.5 cm    Around Proximal Great Toe  9.2 cm                [facility] Adult PT Treatment/Exercise - 02/13/19 0001      Manual Therapy   Manual Therapy  Compression Bandaging;Manual Lymphatic Drainage (MLD);Other (comment)    Manual therapy comments  LE cleansed and moisturized prior to treatment.     Manual Lymphatic Drainage (MLD)  anterior completed supine; posterior completed sidelying to include supraclavicular, deep and superficial abdominal with bilateral inguinal-axillary anastomosis followed by LE     Compression Bandaging  1/2" foam and multilayer short stretch toes to knee bilaterally               PT Short Term Goals - 02/11/19 1637      PT SHORT TERM GOAL  #1   Title  Wounds on Lt LE to no longer be weeping    Time  3    Period  Weeks    Status  Achieved    Target Date  02/13/19      PT SHORT TERM GOAL #2   Title  wounds to be 100% granulated    Time  2    Period  Weeks    Status  Achieved   was achieved but due to not having rx MOn. has blisters.   Target Date  02/06/19      PT SHORT TERM GOAL #3   Title  Pt wounds on her Lt LE to be decreased by 50% in size and there to no longer be any blisters on pt's Rt LE    Time  3    Period  Weeks    Status  On-going   was met but now has new blisters since pt did not have rx Mon.   Target Date  02/13/19        PT Long Term Goals - 02/11/19 1639      PT LONG TERM GOAL #1   Title  All wounds to be healed    Time  6    Period  Weeks    Status  On-going   was achieved now patient has two new wounds     PT LONG TERM GOAL #2   Title  Pt to have decreased 3-4 cm in volume B to allow pain to decrease to no greater than a 2/10 to be able to complete household tasks without resting    Time  6    Period  Weeks    Status  On-going      PT LONG TERM GOAL #3   Title  PT to be able to don shoes and socks without difficulty    Time  6    Period  Weeks    Status  On-going  PT LONG TERM GOAL #4   Title  PT to have obtained and be using a compression pump,(order sent to MD today)    Time  6    Period  Weeks    Status  On-going      PT LONG TERM GOAL #5   Title  PT to have and be able to don and doff compression garment and to be able to verbalize she is to wear them everyday.    Time  6    Period  Weeks    Status  New            Plan - 02/13/19 1234    Clinical Impression Statement  PT has two small wounds with scant drainage where blisters were on lateral aspect of LE, with a third open area on medial aspect of LE.  Therapist covered open areas with xeroform prior to compression bandaging.  PT measured with noted decreased congestion in all areas except for her feet B.     Personal Factors and Comorbidities  Age;Comorbidity 3+    Comorbidities  CHF, CVI lymphedema    Examination-Activity Limitations  Dressing;Hygiene/Grooming;Stand;Locomotion Level;Other    Examination-Participation Restrictions  Community Activity    Stability/Clinical Decision Making  Evolving/Moderate complexity    Rehab Potential  Good    PT Frequency  3x / week    PT Duration  6 weeks    PT Treatment/Interventions  ADLs/Self Care Home Management;Manual techniques;Manual lymph drainage;Compression bandaging;Other (comment)   debridment   PT Next Visit Plan  Begin bandaging full thigh when short stretch obtained.  Continue with complete decongestive techniques; acess wounds    PT Home Exercise Plan  LE exercises to promote lymphatic circulation.    Consulted and Agree with Plan of Care  Patient       Patient will benefit from skilled therapeutic intervention in order to improve the following deficits and impairments:  Decreased skin integrity, Increased edema, Difficulty walking  Visit Diagnosis: Pain in left leg  Lymphedema, not elsewhere classified  Open wound of left lower leg with complication, initial encounter     Problem List Patient Active Problem List   Diagnosis Date Noted  . Venous stasis of both lower extremities 01/13/2019  . Generalized anxiety disorder 11/07/2018  . Non-rheumatic mitral regurgitation 03/20/2017  . Thrombocytopenia ([place]) 09/14/2016  . Hemoglobin decreased 09/14/2016  . Hearing loss 01/06/2015  . Major depressive disorder, recurrent episode, moderate ([place]) 06/07/2014  . Thoracic aorta atherosclerosis ([place]) 03/04/2014  . Degenerative arthritis of thoracic spine 03/04/2014  . Osteoporosis, post-menopausal 08/12/2013  . Metabolic syndrome 45/62/5638  . Anemia, iron deficiency 03/16/2013  . Vitamin D deficiency 03/16/2013  . HTN (hypertension) 03/16/2013  . Hypothyroidism   . Diaphragmatic hernia without mention of obstruction or gangrene   .  GERD (gastroesophageal reflux disease)   . Hyperlipidemia   . Prolapse of vaginal walls without mention of uterine prolapse   . Arthritis   . First degree atrioventricular block   . Symptomatic menopausal or female climacteric states   . AF (atrial fibrillation) ([place])   . Rectal polyp   . Gastric polyp     Rayetta Humphrey, PT CLT [phone] 02/13/2019, 12:37 PM  [facility] [place], [place], [address] Phone: [phone]   Fax:  [phone]  Name: [name] MRN: [id number] Date of Birth: November 18, 1932

## 2019-02-16 ENCOUNTER — Other Ambulatory Visit: Payer: Self-pay

## 2019-02-16 ENCOUNTER — Ambulatory Visit (HOSPITAL_COMMUNITY): Payer: Medicare Other | Admitting: Physical Therapy

## 2019-02-16 ENCOUNTER — Ambulatory Visit (INDEPENDENT_AMBULATORY_CARE_PROVIDER_SITE_OTHER): Payer: Medicare Other

## 2019-02-16 ENCOUNTER — Other Ambulatory Visit: Payer: Medicare Other

## 2019-02-16 DIAGNOSIS — I890 Lymphedema, not elsewhere classified: Secondary | ICD-10-CM | POA: Diagnosis not present

## 2019-02-16 DIAGNOSIS — S81802A Unspecified open wound, left lower leg, initial encounter: Secondary | ICD-10-CM

## 2019-02-16 DIAGNOSIS — R748 Abnormal levels of other serum enzymes: Secondary | ICD-10-CM

## 2019-02-16 DIAGNOSIS — M79605 Pain in left leg: Secondary | ICD-10-CM | POA: Diagnosis not present

## 2019-02-16 DIAGNOSIS — E538 Deficiency of other specified B group vitamins: Secondary | ICD-10-CM

## 2019-02-16 NOTE — Therapy (Signed)
[facility] [place], [place], [address] Phone: [phone]   Fax:  [phone]  Physical Therapy Treatment  Patient Details  Name: [name] MRN: [id number] Date of Birth: 07/10/1932 Referring Provider (PT): Ronnie Doss   Encounter Date: 02/16/2019  PT End of Session - 02/16/19 1756    Visit Number  9    Number of Visits  18    Date for PT Re-Evaluation  03/06/19   reauth   Authorization Type  [employer] medicare/medicaid    Authorization - Visit Number  9    Authorization - Number of Visits  10    PT Start Time  1450    PT Stop Time  1600    PT Time Calculation (min)  70 min    Activity Tolerance  Patient tolerated treatment well    Behavior During Therapy  [facility] for tasks assessed/performed       Past Medical History:  Diagnosis Date  . AF (atrial fibrillation) ([place])   . Arthritis   . Diaphragmatic hernia without mention of obstruction or gangrene   . Esophagitis   . First degree atrioventricular block   . Gastric polyp   . GERD (gastroesophageal reflux disease)   . Heme positive stool   . Hyperlipidemia   . Hypertension   . Hypothyroid   . Prolapse of vaginal walls without mention of uterine prolapse   . Rectal polyp     Past Surgical History:  Procedure Laterality Date  . DILATION AND CURETTAGE OF UTERUS    . KIDNEY STONE SURGERY      There were no vitals filed for this visit.  Subjective Assessment - 02/16/19 1753    Subjective  Pt states her legs continue to hurt her, especially tender on the lateral aspect of her leg.    Currently in Pain?  Yes    Pain Score  4     Pain Location  Leg    Pain Orientation  Left    Pain Descriptors / Indicators  Throbbing                       [facility] Adult PT Treatment/Exercise - 02/16/19 0001      Manual Therapy   Manual Therapy  Compression Bandaging;Manual Lymphatic Drainage (MLD);Other (comment)    Manual therapy comments  LE cleansed  and moisturized prior to treatment.     Manual Lymphatic Drainage (MLD)  anterior completed supine; posterior completed sidelying to include supraclavicular, deep and superficial abdominal with bilateral inguinal-axillary anastomosis followed by LE     Compression Bandaging  1/2" foam and multilayer short stretch toes to knee bilaterally               PT Short Term Goals - 02/11/19 1637      PT SHORT TERM GOAL #1   Title  Wounds on Lt LE to no longer be weeping    Time  3    Period  Weeks    Status  Achieved    Target Date  02/13/19      PT SHORT TERM GOAL #2   Title  wounds to be 100% granulated    Time  2    Period  Weeks    Status  Achieved   was achieved but due to not having rx MOn. has blisters.   Target Date  02/06/19      PT SHORT TERM GOAL #3   Title  Pt wounds on her Lt LE to be decreased by 50% in size and there to no longer be any blisters on pt's Rt LE    Time  3    Period  Weeks    Status  On-going   was met but now has new blisters since pt did not have rx Mon.   Target Date  02/13/19        PT Long Term Goals - 02/11/19 1639      PT LONG TERM GOAL #1   Title  All wounds to be healed    Time  6    Period  Weeks    Status  On-going   was achieved now patient has two new wounds     PT LONG TERM GOAL #2   Title  Pt to have decreased 3-4 cm in volume B to allow pain to decrease to no greater than a 2/10 to be able to complete household tasks without resting    Time  6    Period  Weeks    Status  On-going      PT LONG TERM GOAL #3   Title  PT to be able to don shoes and socks without difficulty    Time  6    Period  Weeks    Status  On-going      PT LONG TERM GOAL #4   Title  PT to have obtained and be using a compression pump,(order sent to MD today)    Time  6    Period  Weeks    Status  On-going      PT LONG TERM GOAL #5   Title  PT to have and be able to don and doff compression garment and to be able to verbalize she is to wear them  everyday.    Time  6    Period  Weeks    Status  New            Plan - 02/16/19 1754    Clinical Impression Statement  pt with 2 small wounds lateral Lt LE and one on medial aspect all approx 1cm, 100% granulated and scant drainage.  Cleansed LE well and removed remaining dry skin.   Moisturized well following massage and prior to bandaging.  Continues to have induration in both LE, into feet and toes as well.    Personal Factors and Comorbidities  Age;Comorbidity 3+    Comorbidities  CHF, CVI lymphedema    Examination-Activity Limitations  Dressing;Hygiene/Grooming;Stand;Locomotion Level;Other    Examination-Participation Restrictions  Community Activity    Stability/Clinical Decision Making  Evolving/Moderate complexity    Rehab Potential  Good    PT Frequency  3x / week    PT Duration  6 weeks    PT Treatment/Interventions  ADLs/Self Care Home Management;Manual techniques;Manual lymph drainage;Compression bandaging;Other (comment)   debridment   PT Next Visit Plan  Begin bandaging full thigh when short stretch obtained.  Continue with complete decongestive techniques; access wounds    PT Home Exercise Plan  LE exercises to promote lymphatic circulation.    Consulted and Agree with Plan of Care  Patient       Patient will benefit from skilled therapeutic intervention in order to improve the following deficits and impairments:  Decreased skin integrity, Increased edema, Difficulty walking  Visit Diagnosis: Open wound of left lower leg with complication, initial encounter  Lymphedema, not elsewhere classified  Pain in left leg     Problem  List Patient Active Problem List   Diagnosis Date Noted  . Venous stasis of both lower extremities 01/13/2019  . Generalized anxiety disorder 11/07/2018  . Non-rheumatic mitral regurgitation 03/20/2017  . Thrombocytopenia ([place]) 09/14/2016  . Hemoglobin decreased 09/14/2016  . Hearing loss 01/06/2015  . Major depressive disorder,  recurrent episode, moderate ([place]) 06/07/2014  . Thoracic aorta atherosclerosis ([place]) 03/04/2014  . Degenerative arthritis of thoracic spine 03/04/2014  . Osteoporosis, post-menopausal 08/12/2013  . Metabolic syndrome 62/69/4854  . Anemia, iron deficiency 03/16/2013  . Vitamin D deficiency 03/16/2013  . HTN (hypertension) 03/16/2013  . Hypothyroidism   . Diaphragmatic hernia without mention of obstruction or gangrene   . GERD (gastroesophageal reflux disease)   . Hyperlipidemia   . Prolapse of vaginal walls without mention of uterine prolapse   . Arthritis   . First degree atrioventricular block   . Symptomatic menopausal or female climacteric states   . AF (atrial fibrillation) ([place])   . Rectal polyp   . Gastric polyp    Teena Irani, PTA/CLT [phone]  Teena Irani 02/16/2019, 5:56 PM  [facility] [address] [place], [place], [address] Phone: [phone]   Fax:  [phone]  Name: LAMIKA CONNOLLY MRN: [id number] Date of Birth: 02-15-1933

## 2019-02-16 NOTE — Progress Notes (Signed)
Cyanocobalamin injection given to right deltoid.  Patient tolerated well. 

## 2019-02-18 ENCOUNTER — Ambulatory Visit (HOSPITAL_COMMUNITY): Payer: Medicare Other | Admitting: Physical Therapy

## 2019-02-18 ENCOUNTER — Other Ambulatory Visit: Payer: Self-pay

## 2019-02-18 DIAGNOSIS — I890 Lymphedema, not elsewhere classified: Secondary | ICD-10-CM | POA: Diagnosis not present

## 2019-02-18 DIAGNOSIS — S81802A Unspecified open wound, left lower leg, initial encounter: Secondary | ICD-10-CM | POA: Diagnosis not present

## 2019-02-18 DIAGNOSIS — M79605 Pain in left leg: Secondary | ICD-10-CM

## 2019-02-18 NOTE — Therapy (Signed)
[facility] [address] [place], [place], [address] Phone: [phone]   Fax:  [phone]  Physical Therapy Treatment Progress Note Reporting Period 01/23/19  to 02/18/2019  See note below for Objective Data and Assessment of Progress/Goals.      Patient Details  Name: [name] MRN: [id number] Date of Birth: [birth year] Referring Provider (PT): Ronnie Doss   Encounter Date: 02/18/2019  PT End of Session - 02/18/19 1518    Visit Number  10    Number of Visits  18    Date for PT Re-Evaluation  03/06/19   reauth   Authorization Type  [employer] medicare/medicaid; progress note completed visit 10    Authorization Time Period  cert 62/9    Authorization - Visit Number  10    Authorization - Number of Visits  20    PT Start Time  1455    PT Stop Time  1610    PT Time Calculation (min)  75 min    Activity Tolerance  Patient tolerated treatment well    Behavior During Therapy  [facility] for tasks assessed/performed       Past Medical History:  Diagnosis Date  . AF (atrial fibrillation) ([place])   . Arthritis   . Diaphragmatic hernia without mention of obstruction or gangrene   . Esophagitis   . First degree atrioventricular block   . Gastric polyp   . GERD (gastroesophageal reflux disease)   . Heme positive stool   . Hyperlipidemia   . Hypertension   . Hypothyroid   . Prolapse of vaginal walls without mention of uterine prolapse   . Rectal polyp     Past Surgical History:  Procedure Laterality Date  . DILATION AND CURETTAGE OF UTERUS    . KIDNEY STONE SURGERY      There were no vitals filed for this visit.  Subjective Assessment - 02/18/19 1510    Subjective  pt states she still has not gotten her bandages but headed to her son's shop when she leaves to find out what is going on with them.  STates the fitter is coming today to measure her legs. Rmeoved her bandaging this morning and her legs hurt the same with and without  them on.            LYMPHEDEMA/ONCOLOGY QUESTIONNAIRE - 02/18/19 1701      What other symptoms do you have   Are you Having Heaviness or Tightness  Yes    Are you having Pain  Yes    Are you having pitting edema  Yes    Body Site  LE    Is it Hard or Difficult finding clothes that fit  Yes    Stemmer Sign  Yes    Other Symptoms  weeping       Lymphedema Stage   Stage  STAGE 2 SPONTANEOUSLY IRREVERSIBLE      Lymphedema Assessments   Lymphedema Assessments  Lower extremities      Right Lower Extremity Lymphedema   At Groin Measure at Horizontal from Pubic Bone  0 cm    20 cm Proximal to Suprapatella  59 cm   was 59.5 on 10/23   10 cm Proximal to Suprapatella  57 cm   was 57.5    At Midpatella/Popliteal Crease  52 cm   was 52.3   30 cm Proximal to Floor at Lateral Plantar Foot  45 cm   was 47.9   20 cm Proximal to  Floor at Lateral Plantar Foot  36.5 1   was 39.7   10 cm Proximal to Floor at Lateral Malleoli  29 cm   was 29.8   Circumference of ankle/heel  37 cm.   was 37.5   5 cm Proximal to 1st MTP Joint  27.5 cm   was 28   Across MTP Joint  25.4 cm   was 27   Around Proximal Great Toe  8 cm   was 8.5 cm     Left Lower Extremity Lymphedema   20 cm Proximal to Suprapatella  62 cm   was 61cm on 10/23   10 cm Proximal to Suprapatella  60 cm   was 58.6   At Midpatella/Popliteal Crease  54 cm   was 52   30 cm Proximal to Floor at Lateral Plantar Foot  47.2 cm   was 48   20 cm Proximal to Floor at Lateral Plantar Foot  38 cm   was 39.3   10 cm Proximal to Floor at Lateral Malleoli  30.5 cm   was 31.2   Circumference of ankle/heel  37 cm.   was 37.8   5 cm Proximal to 1st MTP Joint  28 cm   was 29   Across MTP Joint  27 cm   was 27.5   Around Proximal Great Toe  8 cm   was 8.7                         PT Short Term Goals - 02/11/19 1637      PT SHORT TERM GOAL #1   Title  Wounds on Lt LE to no longer be weeping    Time  3     Period  Weeks    Status  Achieved    Target Date  02/13/19      PT SHORT TERM GOAL #2   Title  wounds to be 100% granulated    Time  2    Period  Weeks    Status  Achieved   was achieved but due to not having rx MOn. has blisters.   Target Date  02/06/19      PT SHORT TERM GOAL #3   Title  Pt wounds on her Lt LE to be decreased by 50% in size and there to no longer be any blisters on pt's Rt LE    Time  3    Period  Weeks    Status  On-going   was met but now has new blisters since pt did not have rx Mon.   Target Date  02/13/19        PT Long Term Goals - 02/11/19 1639      PT LONG TERM GOAL #1   Title  All wounds to be healed    Time  6    Period  Weeks    Status  On-going   was achieved now patient has two new wounds     PT LONG TERM GOAL #2   Title  Pt to have decreased 3-4 cm in volume B to allow pain to decrease to no greater than a 2/10 to be able to complete household tasks without resting    Time  6    Period  Weeks    Status  On-going      PT LONG TERM GOAL #3   Title  PT to be able to don shoes and socks without difficulty  Time  6    Period  Weeks    Status  On-going      PT LONG TERM GOAL #4   Title  PT to have obtained and be using a compression pump,(order sent to MD today)    Time  6    Period  Weeks    Status  On-going      PT LONG TERM GOAL #5   Title  PT to have and be able to don and doff compression garment and to be able to verbalize she is to wear them everyday.    Time  6    Period  Weeks    Status  New            Plan - 02/18/19 1724    Clinical Impression Statement  All wounds on Lt LE are now healed.  LE's measured with overall reduction noted in both LE, slightly up over midpattellar regions.  Fitter came to measure for juxtafits [employer] handling pump).  Manual completed with continued induration present both LE.  Still without all bandaging to complete up into thighs and instructed to do this asap.  will continue to  benefit from complete lymphedema therapy.    Personal Factors and Comorbidities  Age;Comorbidity 3+    Comorbidities  CHF, CVI lymphedema    Examination-Activity Limitations  Dressing;Hygiene/Grooming;Stand;Locomotion Level;Other    Examination-Participation Restrictions  Community Activity    Stability/Clinical Decision Making  Evolving/Moderate complexity    Rehab Potential  Good    PT Frequency  3x / week    PT Duration  6 weeks    PT Treatment/Interventions  ADLs/Self Care Home Management;Manual techniques;Manual lymph drainage;Compression bandaging;Other (comment)   debridment   PT Next Visit Plan  Begin bandaging full thigh when short stretch obtained.  Continue with complete decongestive techniques    PT Home Exercise Plan  LE exercises to promote lymphatic circulation.    Consulted and Agree with Plan of Care  Patient       Patient will benefit from skilled therapeutic intervention in order to improve the following deficits and impairments:  Decreased skin integrity, Increased edema, Difficulty walking  Visit Diagnosis: Lymphedema, not elsewhere classified  Pain in left leg  Open wound of left lower leg with complication, initial encounter     Problem List Patient Active Problem List   Diagnosis Date Noted  . Venous stasis of both lower extremities 01/13/2019  . Generalized anxiety disorder 11/07/2018  . Non-rheumatic mitral regurgitation 03/20/2017  . Thrombocytopenia ([place]) 09/14/2016  . Hemoglobin decreased 09/14/2016  . Hearing loss 01/06/2015  . Major depressive disorder, recurrent episode, moderate ([place]) 06/07/2014  . Thoracic aorta atherosclerosis ([place]) 03/04/2014  . Degenerative arthritis of thoracic spine 03/04/2014  . Osteoporosis, post-menopausal 08/12/2013  . Metabolic syndrome 03/55/9741  . Anemia, iron deficiency 03/16/2013  . Vitamin D deficiency 03/16/2013  . HTN (hypertension) 03/16/2013  . Hypothyroidism   . Diaphragmatic hernia without mention  of obstruction or gangrene   . GERD (gastroesophageal reflux disease)   . Hyperlipidemia   . Prolapse of vaginal walls without mention of uterine prolapse   . Arthritis   . First degree atrioventricular block   . Symptomatic menopausal or female climacteric states   . AF (atrial fibrillation) ([place])   . Rectal polyp   . Gastric polyp    Teena Irani, PTA/CLT [phone]  Teena Irani 02/18/2019, 5:29 PM  [facility] [place], [place],  [place] Phone: [phone]   Fax:  [phone]  Name: MAECYN PANNING MRN: [id number] Date of Birth: [birth year]

## 2019-02-20 ENCOUNTER — Encounter (HOSPITAL_COMMUNITY): Payer: Self-pay | Admitting: Physical Therapy

## 2019-02-20 ENCOUNTER — Ambulatory Visit (HOSPITAL_COMMUNITY): Payer: Medicare Other | Admitting: Physical Therapy

## 2019-02-20 ENCOUNTER — Other Ambulatory Visit: Payer: Self-pay

## 2019-02-20 DIAGNOSIS — S81802A Unspecified open wound, left lower leg, initial encounter: Secondary | ICD-10-CM | POA: Diagnosis not present

## 2019-02-20 DIAGNOSIS — I890 Lymphedema, not elsewhere classified: Secondary | ICD-10-CM | POA: Diagnosis not present

## 2019-02-20 DIAGNOSIS — M79605 Pain in left leg: Secondary | ICD-10-CM

## 2019-02-20 NOTE — Therapy (Signed)
[facility] [address] [place], [place], [address] Phone: [phone]   Fax:  [phone]  Physical Therapy Treatment  Patient Details  Name: [name] MRN: [id number] Date of Birth: Jan 20, 1933 Referring Provider (PT): Ronnie Doss   Encounter Date: 02/20/2019  PT End of Session - 02/20/19 1515    Visit Number  11    Number of Visits  18    Date for PT Re-Evaluation  03/06/19   reauth   Authorization Type  [employer] medicare/medicaid; progress note completed visit 10    Authorization Time Period  cert 31/5    Authorization - Visit Number  11    Authorization - Number of Visits  20    PT Start Time  1761    PT Stop Time  1500    PT Time Calculation (min)  55 min    Activity Tolerance  Patient tolerated treatment well    Behavior During Therapy  [facility] for tasks assessed/performed       Past Medical History:  Diagnosis Date  . AF (atrial fibrillation) ([place])   . Arthritis   . Diaphragmatic hernia without mention of obstruction or gangrene   . Esophagitis   . First degree atrioventricular block   . Gastric polyp   . GERD (gastroesophageal reflux disease)   . Heme positive stool   . Hyperlipidemia   . Hypertension   . Hypothyroid   . Prolapse of vaginal walls without mention of uterine prolapse   . Rectal polyp     Past Surgical History:  Procedure Laterality Date  . DILATION AND CURETTAGE OF UTERUS    . KIDNEY STONE SURGERY      There were no vitals filed for this visit.  Subjective Assessment - 02/20/19 1510    Subjective  Pt states that she ordered the bandages herself at [facility] yesterday. She has not been completing any of the self mannual techniques on her thigh as she forgot.    Patient is accompained by:  Family member    Pertinent History  CHF, CVI    Limitations  Standing    How long can you sit comfortably?  no problem    How long can you stand comfortably?  5-10 min    How long can you walk  comfortably?  Pt does not walk much    Currently in Pain?  Yes    Pain Score  5     Pain Location  Leg    Pain Orientation  Left;Right    Pain Descriptors / Indicators  Sore    Pain Type  Chronic pain    Pain Onset  1 to 4 weeks ago    Aggravating Factors   legs down    Pain Relieving Factors  legs elevated                       [facility] Adult PT Treatment/Exercise - 02/20/19 0001      Manual Therapy   Manual Therapy  Compression Bandaging;Manual Lymphatic Drainage (MLD);Other (comment)    Manual therapy comments  LE cleansed and moisturized prior to treatment.     Manual Lymphatic Drainage (MLD)  anterior completed supine;  to include supraclavicular, deep and superficial abdominal with bilateral inguinal-axillary anastomosis followed by LE     Compression Bandaging  1/2" foam and multilayer short stretch MTP to knee bilaterally               PT  Short Term Goals - 02/18/19 1730      PT SHORT TERM GOAL #1   Title  Wounds on Lt LE to no longer be weeping    Time  3    Period  Weeks    Status  Achieved    Target Date  02/13/19      PT SHORT TERM GOAL #2   Title  wounds to be 100% granulated    Time  2    Period  Weeks    Status  Achieved   was achieved but due to not having rx MOn. has blisters.   Target Date  02/06/19      PT SHORT TERM GOAL #3   Title  Pt wounds on her Lt LE to be decreased by 50% in size and there to no longer be any blisters on pt's Rt LE    Time  3    Period  Weeks    Status  Achieved   was met but now has new blisters since pt did not have rx Mon.   Target Date  02/13/19        PT Long Term Goals - 02/18/19 1733      PT LONG TERM GOAL #1   Title  All wounds to be healed    Time  6    Period  Weeks    Status  Achieved   was achieved now patient has two new wounds     PT LONG TERM GOAL #2   Title  Pt to have decreased 3-4 cm in volume B to allow pain to decrease to no greater than a 2/10 to be able to complete  household tasks without resting    Time  6    Period  Weeks    Status  On-going      PT LONG TERM GOAL #3   Title  PT to be able to don shoes and socks without difficulty    Time  6    Period  Weeks    Status  On-going      PT LONG TERM GOAL #4   Title  PT to have obtained and be using a compression pump,(order sent to MD today)    Time  6    Period  Weeks    Status  On-going      PT LONG TERM GOAL #5   Title  PT to have and be able to don and doff compression garment and to be able to verbalize she is to wear them everyday.    Time  6    Period  Weeks    Status  New            Plan - 02/20/19 1516    Clinical Impression Statement  Wounds remain healed. Pt continues to have induration along distal thighs. Therapist reviewed self manual to thigh area to keep this area from becoming to indurated.    Personal Factors and Comorbidities  Age;Comorbidity 3+    Comorbidities  CHF, CVI lymphedema    Examination-Activity Limitations  Dressing;Hygiene/Grooming;Stand;Locomotion Level;Other    Examination-Participation Restrictions  Community Activity    Stability/Clinical Decision Making  Evolving/Moderate complexity    Rehab Potential  Good    PT Frequency  3x / week    PT Duration  6 weeks    PT Treatment/Interventions  ADLs/Self Care Home Management;Manual techniques;Manual lymph drainage;Compression bandaging;Other (comment)   debridment   PT Next Visit Plan  Begin bandaging full thigh  when short stretch obtained.  Continue with complete decongestive techniques    PT Home Exercise Plan  LE exercises to promote lymphatic circulation.    Consulted and Agree with Plan of Care  Patient       Patient will benefit from skilled therapeutic intervention in order to improve the following deficits and impairments:  Decreased skin integrity, Increased edema, Difficulty walking  Visit Diagnosis: Lymphedema, not elsewhere classified  Pain in left leg     Problem List Patient  Active Problem List   Diagnosis Date Noted  . Venous stasis of both lower extremities 01/13/2019  . Generalized anxiety disorder 11/07/2018  . Non-rheumatic mitral regurgitation 03/20/2017  . Thrombocytopenia ([place]) 09/14/2016  . Hemoglobin decreased 09/14/2016  . Hearing loss 01/06/2015  . Major depressive disorder, recurrent episode, moderate ([place]) 06/07/2014  . Thoracic aorta atherosclerosis ([place]) 03/04/2014  . Degenerative arthritis of thoracic spine 03/04/2014  . Osteoporosis, post-menopausal 08/12/2013  . Metabolic syndrome 38/12/1749  . Anemia, iron deficiency 03/16/2013  . Vitamin D deficiency 03/16/2013  . HTN (hypertension) 03/16/2013  . Hypothyroidism   . Diaphragmatic hernia without mention of obstruction or gangrene   . GERD (gastroesophageal reflux disease)   . Hyperlipidemia   . Prolapse of vaginal walls without mention of uterine prolapse   . Arthritis   . First degree atrioventricular block   . Symptomatic menopausal or female climacteric states   . AF (atrial fibrillation) ([place])   . Rectal polyp   . Gastric polyp    Rayetta Humphrey, PT CLT [phone] 02/20/2019, 3:18 PM  [facility] [place], [place], [address] Phone: [phone]   Fax:  [phone]  Name: MIEKE BRINLEY MRN: [id number] Date of Birth: [birth year]

## 2019-02-23 ENCOUNTER — Other Ambulatory Visit: Payer: Self-pay | Admitting: Family Medicine

## 2019-02-23 ENCOUNTER — Ambulatory Visit (HOSPITAL_COMMUNITY): Payer: Medicare Other | Admitting: Physical Therapy

## 2019-02-23 ENCOUNTER — Other Ambulatory Visit: Payer: Self-pay

## 2019-02-23 DIAGNOSIS — K2100 Gastro-esophageal reflux disease with esophagitis, without bleeding: Secondary | ICD-10-CM

## 2019-02-23 DIAGNOSIS — I890 Lymphedema, not elsewhere classified: Secondary | ICD-10-CM | POA: Diagnosis not present

## 2019-02-23 DIAGNOSIS — S81802A Unspecified open wound, left lower leg, initial encounter: Secondary | ICD-10-CM | POA: Diagnosis not present

## 2019-02-23 DIAGNOSIS — M79605 Pain in left leg: Secondary | ICD-10-CM | POA: Diagnosis not present

## 2019-02-23 NOTE — Therapy (Signed)
[facility] [address] [place], [place], [address] Phone: [phone]   Fax:  [phone]  Physical Therapy Treatment  Patient Details  Name: [name] MRN: [id number] Date of Birth: 1932/07/19 Referring Provider (PT): Ronnie Doss   Encounter Date: 02/23/2019  PT End of Session - 02/23/19 1609    Visit Number  12    Number of Visits  18    Date for PT Re-Evaluation  03/06/19   reauth   Authorization Type  [employer] medicare/medicaid; progress note completed visit 10    Authorization Time Period  cert 03/5    Authorization - Visit Number  12    Authorization - Number of Visits  20    PT Start Time  5974    PT Stop Time  1603    PT Time Calculation (min)  71 min    Activity Tolerance  Patient tolerated treatment well    Behavior During Therapy  [facility] for tasks assessed/performed       Past Medical History:  Diagnosis Date  . AF (atrial fibrillation) ([place])   . Arthritis   . Diaphragmatic hernia without mention of obstruction or gangrene   . Esophagitis   . First degree atrioventricular block   . Gastric polyp   . GERD (gastroesophageal reflux disease)   . Heme positive stool   . Hyperlipidemia   . Hypertension   . Hypothyroid   . Prolapse of vaginal walls without mention of uterine prolapse   . Rectal polyp     Past Surgical History:  Procedure Laterality Date  . DILATION AND CURETTAGE OF UTERUS    . KIDNEY STONE SURGERY      There were no vitals filed for this visit.  Subjective Assessment - 02/23/19 1608    Subjective  pt states she hopes to get her bandages by Thanksgiving but may be afterward.  No pain or issues today.    Currently in Pain?  No/denies                       [facility] Adult PT Treatment/Exercise - 02/23/19 0001      Manual Therapy   Manual Therapy  Compression Bandaging;Manual Lymphatic Drainage (MLD);Other (comment)    Manual therapy comments  LE cleansed and moisturized prior  to treatment.     Manual Lymphatic Drainage (MLD)  anterior completed supine;  to include supraclavicular, deep and superficial abdominal with bilateral inguinal-axillary anastomosis followed by LE     Compression Bandaging  1/2" foam and multilayer short stretch MTP to knee bilaterally               PT Short Term Goals - 02/18/19 1730      PT SHORT TERM GOAL #1   Title  Wounds on Lt LE to no longer be weeping    Time  3    Period  Weeks    Status  Achieved    Target Date  02/13/19      PT SHORT TERM GOAL #2   Title  wounds to be 100% granulated    Time  2    Period  Weeks    Status  Achieved   was achieved but due to not having rx MOn. has blisters.   Target Date  02/06/19      PT SHORT TERM GOAL #3   Title  Pt wounds on her Lt LE to be decreased by 50% in size and there to  no longer be any blisters on pt's Rt LE    Time  3    Period  Weeks    Status  Achieved   was met but now has new blisters since pt did not have rx Mon.   Target Date  02/13/19        PT Long Term Goals - 02/18/19 1733      PT LONG TERM GOAL #1   Title  All wounds to be healed    Time  6    Period  Weeks    Status  Achieved   was achieved now patient has two new wounds     PT LONG TERM GOAL #2   Title  Pt to have decreased 3-4 cm in volume B to allow pain to decrease to no greater than a 2/10 to be able to complete household tasks without resting    Time  6    Period  Weeks    Status  On-going      PT LONG TERM GOAL #3   Title  PT to be able to don shoes and socks without difficulty    Time  6    Period  Weeks    Status  On-going      PT LONG TERM GOAL #4   Title  PT to have obtained and be using a compression pump,(order sent to MD today)    Time  6    Period  Weeks    Status  On-going      PT LONG TERM GOAL #5   Title  PT to have and be able to don and doff compression garment and to be able to verbalize she is to wear them everyday.    Time  6    Period  Weeks    Status   New            Plan - 02/23/19 1612    Clinical Impression Statement  No wounds present, continued with manual lymph drainage both anterior and posterior (in sidelying).  Induration remains unchanged with more situated behind knees bilaterally.  Moisturzied LE's well prior to rebandaging. Urged to get new shoes as they are too tight across the bridge of foot and pushes bandages back.    Personal Factors and Comorbidities  Age;Comorbidity 3+    Comorbidities  CHF, CVI lymphedema    Examination-Activity Limitations  Dressing;Hygiene/Grooming;Stand;Locomotion Level;Other    Examination-Participation Restrictions  Community Activity    Stability/Clinical Decision Making  Evolving/Moderate complexity    Rehab Potential  Good    PT Frequency  3x / week    PT Duration  6 weeks    PT Treatment/Interventions  ADLs/Self Care Home Management;Manual techniques;Manual lymph drainage;Compression bandaging;Other (comment)   debridment   PT Next Visit Plan  Begin bandaging full thigh when short stretch obtained.  Continue with complete decongestive techniques    PT Home Exercise Plan  LE exercises to promote lymphatic circulation.    Consulted and Agree with Plan of Care  Patient       Patient will benefit from skilled therapeutic intervention in order to improve the following deficits and impairments:  Decreased skin integrity, Increased edema, Difficulty walking  Visit Diagnosis: Lymphedema, not elsewhere classified  Open wound of left lower leg with complication, initial encounter  Pain in left leg     Problem List Patient Active Problem List   Diagnosis Date Noted  . Venous stasis of both lower extremities 01/13/2019  . Generalized  anxiety disorder 11/07/2018  . Non-rheumatic mitral regurgitation 03/20/2017  . Thrombocytopenia ([place]) 09/14/2016  . Hemoglobin decreased 09/14/2016  . Hearing loss 01/06/2015  . Major depressive disorder, recurrent episode, moderate ([place]) 06/07/2014   . Thoracic aorta atherosclerosis ([place]) 03/04/2014  . Degenerative arthritis of thoracic spine 03/04/2014  . Osteoporosis, post-menopausal 08/12/2013  . Metabolic syndrome 39/53/2023  . Anemia, iron deficiency 03/16/2013  . Vitamin D deficiency 03/16/2013  . HTN (hypertension) 03/16/2013  . Hypothyroidism   . Diaphragmatic hernia without mention of obstruction or gangrene   . GERD (gastroesophageal reflux disease)   . Hyperlipidemia   . Prolapse of vaginal walls without mention of uterine prolapse   . Arthritis   . First degree atrioventricular block   . Symptomatic menopausal or female climacteric states   . AF (atrial fibrillation) ([place])   . Rectal polyp   . Gastric polyp    Teena Irani, PTA/CLT [phone]  Teena Irani 02/23/2019, 4:15 PM  [facility] [address] [place], [place], [address] Phone: [phone]   Fax:  [phone]  Name: [name] MRN: [id number] Date of Birth: Feb 25, 1933

## 2019-02-24 ENCOUNTER — Other Ambulatory Visit: Payer: Self-pay | Admitting: *Deleted

## 2019-02-24 DIAGNOSIS — K2100 Gastro-esophageal reflux disease with esophagitis, without bleeding: Secondary | ICD-10-CM

## 2019-02-24 MED ORDER — OMEPRAZOLE 20 MG PO CPDR: DELAYED_RELEASE_CAPSULE | ORAL | 0 refills | Status: DC

## 2019-02-25 ENCOUNTER — Other Ambulatory Visit: Payer: Self-pay

## 2019-02-25 ENCOUNTER — Encounter (HOSPITAL_COMMUNITY): Payer: Self-pay | Admitting: Physical Therapy

## 2019-02-25 ENCOUNTER — Ambulatory Visit (HOSPITAL_COMMUNITY): Payer: Medicare Other | Admitting: Physical Therapy

## 2019-02-25 DIAGNOSIS — M79605 Pain in left leg: Secondary | ICD-10-CM

## 2019-02-25 DIAGNOSIS — S81802A Unspecified open wound, left lower leg, initial encounter: Secondary | ICD-10-CM | POA: Diagnosis not present

## 2019-02-25 DIAGNOSIS — I890 Lymphedema, not elsewhere classified: Secondary | ICD-10-CM

## 2019-02-25 NOTE — Therapy (Signed)
[place], [place], [address] Phone: [phone]   Fax:  [phone]  Physical Therapy Treatment  Patient Details  Name: [name] MRN: [id number] Date of Birth: 12-Feb-1933 Referring Provider (PT): Ronnie Doss   Encounter Date: 02/25/2019  PT End of Session - 02/25/19 1626    Visit Number  13    Number of Visits  18    Date for PT Re-Evaluation  03/06/19   reauth   Authorization Type  [employer] medicare/medicaid; progress note completed visit 10    Authorization Time Period  cert 03/7    Authorization - Visit Number  13    Authorization - Number of Visits  20    PT Start Time  1500    PT Stop Time  0488    PT Time Calculation (min)  75 min    Activity Tolerance  Patient tolerated treatment well    Behavior During Therapy  [facility] for tasks assessed/performed       Past Medical History:  Diagnosis Date  . AF (atrial fibrillation) ([place])   . Arthritis   . Diaphragmatic hernia without mention of obstruction or gangrene   . Esophagitis   . First degree atrioventricular block   . Gastric polyp   . GERD (gastroesophageal reflux disease)   . Heme positive stool   . Hyperlipidemia   . Hypertension   . Hypothyroid   . Prolapse of vaginal walls without mention of uterine prolapse   . Rectal polyp     Past Surgical History:  Procedure Laterality Date  . DILATION AND CURETTAGE OF UTERUS    . KIDNEY STONE SURGERY      There were no vitals filed for this visit.  Subjective Assessment - 02/25/19 1622    Subjective  Pt states that she has been up baking all day today getting ready for Thanksgiving.  States someone called her yesterday talking about insurance and she thought it was a prank call due to not being able to hear them so she hung up. (it was [employer]).    Patient is accompained by:  Family member    Pertinent History  CHF, CVI    Limitations  Standing    How long can you sit comfortably?   no problem    How long can you stand comfortably?  5-10 min    How long can you walk comfortably?  Pt does not walk much    Currently in Pain?  Yes    Pain Score  2     Pain Location  Leg    Pain Orientation  Left    Pain Descriptors / Indicators  Tightness    Pain Type  Chronic pain    Pain Onset  1 to 4 weeks ago    Pain Frequency  Constant    Aggravating Factors   dependent postition    Pain Relieving Factors  elevating            LYMPHEDEMA/ONCOLOGY QUESTIONNAIRE - 02/25/19 1459      Right Lower Extremity Lymphedema   20 cm Proximal to Suprapatella  62 cm  (Pended)     10 cm Proximal to Suprapatella  61.8 cm  (Pended)     At Midpatella/Popliteal Crease  54 cm  (Pended)     30 cm Proximal to Floor at Lateral Plantar Foot  46.3 cm  (Pended)     20 cm Proximal to Floor at Lateral Plantar Foot  36.2 1  (Pended)     10 cm Proximal to Floor at Lateral Malleoli  31.5 cm  (Pended)     Circumference of ankle/heel  38.5 cm.  (Pended)     5 cm Proximal to 1st MTP Joint  28.7 cm  (Pended)     Across MTP Joint  24.5 cm  (Pended)     Around Proximal Great Toe  8.5 cm  (Pended)       Left Lower Extremity Lymphedema   20 cm Proximal to Suprapatella  65 cm  (Pended)     10 cm Proximal to Suprapatella  62.3 cm  (Pended)     At Midpatella/Popliteal Crease  55.3 cm  (Pended)     30 cm Proximal to Floor at Lateral Plantar Foot  49.6 cm  (Pended)     20 cm Proximal to Floor at Lateral Plantar Foot  38.5 cm  (Pended)     10 cm Proximal to Floor at Lateral Malleoli  31.8 cm  (Pended)     Circumference of ankle/heel  38.5 cm.  (Pended)     5 cm Proximal to 1st MTP Joint  28.3 cm  (Pended)     Across MTP Joint  26.4 cm  (Pended)     Around Proximal Great Toe  9 cm  (Pended)                 [facility] Adult PT Treatment/Exercise - 02/25/19 0001      Manual Therapy   Manual Therapy  Compression Bandaging;Manual Lymphatic Drainage (MLD);Other (comment)    Manual therapy comments  LE  cleansed and moisturized prior to treatment.     Manual Lymphatic Drainage (MLD)  anterior completed supine;  to include supraclavicular, deep and superficial abdominal with bilateral inguinal-axillary anastomosis followed by LE     Compression Bandaging  1/2" foam and multilayer short stretch toes to knee bilaterally    Other Manual Therapy  measurement.                PT Short Term Goals - 02/18/19 1730      PT SHORT TERM GOAL #1   Title  Wounds on Lt LE to no longer be weeping    Time  3    Period  Weeks    Status  Achieved    Target Date  02/13/19      PT SHORT TERM GOAL #2   Title  wounds to be 100% granulated    Time  2    Period  Weeks    Status  Achieved   was achieved but due to not having rx MOn. has blisters.   Target Date  02/06/19      PT SHORT TERM GOAL #3   Title  Pt wounds on her Lt LE to be decreased by 50% in size and there to no longer be any blisters on pt's Rt LE    Time  3    Period  Weeks    Status  Achieved   was met but now has new blisters since pt did not have rx Mon.   Target Date  02/13/19        PT Long Term Goals - 02/18/19 1733      PT LONG TERM GOAL #1   Title  All wounds to be healed    Time  6    Period  Weeks    Status  Achieved   was achieved now patient has two  new wounds     PT LONG TERM GOAL #2   Title  Pt to have decreased 3-4 cm in volume B to allow pain to decrease to no greater than a 2/10 to be able to complete household tasks without resting    Time  6    Period  Weeks    Status  On-going      PT LONG TERM GOAL #3   Title  PT to be able to don shoes and socks without difficulty    Time  6    Period  Weeks    Status  On-going      PT LONG TERM GOAL #4   Title  PT to have obtained and be using a compression pump,(order sent to MD today)    Time  6    Period  Weeks    Status  On-going      PT LONG TERM GOAL #5   Title  PT to have and be able to don and doff compression garment and to be able to verbalize  she is to wear them everyday.    Time  6    Period  Weeks    Status  New            Plan - 02/25/19 1626    Clinical Impression Statement  Wounds continue to be healed but volumes are up most likely due to pt standing in one place for prolong time due to baking.  Therapist explained that the pt needs to call [employer] back as her garment and pump are covered by insurance and they need to coordinate a time to measure her.  Pt verbalized understanding.  Pt continues to have the most induration along posterior distal thigh Bilaterally.  Thigh bandages should be in next week.    Personal Factors and Comorbidities  Age;Comorbidity 3+    Comorbidities  CHF, CVI lymphedema    Examination-Activity Limitations  Dressing;Hygiene/Grooming;Stand;Locomotion Level;Other    Examination-Participation Restrictions  Community Activity    Stability/Clinical Decision Making  Evolving/Moderate complexity    Rehab Potential  Good    PT Frequency  3x / week    PT Duration  6 weeks    PT Treatment/Interventions  ADLs/Self Care Home Management;Manual techniques;Manual lymph drainage;Compression bandaging;Other (comment)   debridment   PT Next Visit Plan  Begin bandaging full thigh when short stretch obtained.  Continue with complete decongestive techniques    PT Home Exercise Plan  LE exercises to promote lymphatic circulation.    Consulted and Agree with Plan of Care  Patient       Patient will benefit from skilled therapeutic intervention in order to improve the following deficits and impairments:  Decreased skin integrity, Increased edema, Difficulty walking  Visit Diagnosis: Lymphedema, not elsewhere classified  Pain in left leg     Problem List Patient Active Problem List   Diagnosis Date Noted  . Venous stasis of both lower extremities 01/13/2019  . Generalized anxiety disorder 11/07/2018  . Non-rheumatic mitral regurgitation 03/20/2017  . Thrombocytopenia ([place]) 09/14/2016  . Hemoglobin  decreased 09/14/2016  . Hearing loss 01/06/2015  . Major depressive disorder, recurrent episode, moderate ([place]) 06/07/2014  . Thoracic aorta atherosclerosis ([place]) 03/04/2014  . Degenerative arthritis of thoracic spine 03/04/2014  . Osteoporosis, post-menopausal 08/12/2013  . Metabolic syndrome 30/16/0109  . Anemia, iron deficiency 03/16/2013  . Vitamin D deficiency 03/16/2013  . HTN (hypertension) 03/16/2013  . Hypothyroidism   . Diaphragmatic hernia without mention of obstruction or  gangrene   . GERD (gastroesophageal reflux disease)   . Hyperlipidemia   . Prolapse of vaginal walls without mention of uterine prolapse   . Arthritis   . First degree atrioventricular block   . Symptomatic menopausal or female climacteric states   . AF (atrial fibrillation) ([place])   . Rectal polyp   . Gastric polyp     Rayetta Humphrey, PT CLT [phone] 02/25/2019, 4:29 PM  [facility] [address] [place], [place], [address] Phone: [phone]   Fax:  [phone]  Name: [name] MRN: [id number] Date of Birth: [birth year]

## 2019-03-02 ENCOUNTER — Other Ambulatory Visit: Payer: Self-pay

## 2019-03-02 ENCOUNTER — Ambulatory Visit (HOSPITAL_COMMUNITY): Payer: Medicare Other | Admitting: Physical Therapy

## 2019-03-02 DIAGNOSIS — S81802A Unspecified open wound, left lower leg, initial encounter: Secondary | ICD-10-CM

## 2019-03-02 DIAGNOSIS — I890 Lymphedema, not elsewhere classified: Secondary | ICD-10-CM

## 2019-03-02 DIAGNOSIS — M79605 Pain in left leg: Secondary | ICD-10-CM | POA: Diagnosis not present

## 2019-03-02 NOTE — Therapy (Signed)
[facility] [address] [place], [place], [address] Phone: [phone]   Fax:  [phone]  Physical Therapy Treatment  Patient Details  Name: [name] MRN: [id number] Date of Birth: Feb 22, 1933 Referring Provider (PT): Ronnie Doss   Encounter Date: 03/02/2019  PT End of Session - 03/02/19 1511    Visit Number  14    Number of Visits  18    Date for PT Re-Evaluation  03/06/19   reauth   Authorization Type  [employer] medicare/medicaid; progress note completed visit 10    Authorization Time Period  cert 53/9    Authorization - Visit Number  14    Authorization - Number of Visits  20    PT Start Time  1320    PT Stop Time  1430    PT Time Calculation (min)  70 min    Activity Tolerance  Patient tolerated treatment well    Behavior During Therapy  [facility] for tasks assessed/performed       Past Medical History:  Diagnosis Date  . AF (atrial fibrillation) ([place])   . Arthritis   . Diaphragmatic hernia without mention of obstruction or gangrene   . Esophagitis   . First degree atrioventricular block   . Gastric polyp   . GERD (gastroesophageal reflux disease)   . Heme positive stool   . Hyperlipidemia   . Hypertension   . Hypothyroid   . Prolapse of vaginal walls without mention of uterine prolapse   . Rectal polyp     Past Surgical History:  Procedure Laterality Date  . DILATION AND CURETTAGE OF UTERUS    . KIDNEY STONE SURGERY      There were no vitals filed for this visit.  Subjective Assessment - 03/02/19 1511    Subjective  pt states she kept her bandaging on until Saturday.  Noted blistering today on Lt LE.    Currently in Pain?  No/denies                       [facility] Adult PT Treatment/Exercise - 03/02/19 0001      Manual Therapy   Manual Therapy  Compression Bandaging;Manual Lymphatic Drainage (MLD);Other (comment)    Manual therapy comments  LE cleansed and moisturized prior to treatment.      Manual Lymphatic Drainage (MLD)  anterior completed supine;  to include supraclavicular, deep and superficial abdominal with bilateral inguinal-axillary anastomosis followed by LE     Compression Bandaging  1/2" foam and multilayer short stretch toes to knee bilaterally               PT Short Term Goals - 02/18/19 1730      PT SHORT TERM GOAL #1   Title  Wounds on Lt LE to no longer be weeping    Time  3    Period  Weeks    Status  Achieved    Target Date  02/13/19      PT SHORT TERM GOAL #2   Title  wounds to be 100% granulated    Time  2    Period  Weeks    Status  Achieved   was achieved but due to not having rx MOn. has blisters.   Target Date  02/06/19      PT SHORT TERM GOAL #3   Title  Pt wounds on her Lt LE to be decreased by 50% in size and there to no longer be any  blisters on pt's Rt LE    Time  3    Period  Weeks    Status  Achieved   was met but now has new blisters since pt did not have rx Mon.   Target Date  02/13/19        PT Long Term Goals - 02/18/19 1733      PT LONG TERM GOAL #1   Title  All wounds to be healed    Time  6    Period  Weeks    Status  Achieved   was achieved now patient has two new wounds     PT LONG TERM GOAL #2   Title  Pt to have decreased 3-4 cm in volume B to allow pain to decrease to no greater than a 2/10 to be able to complete household tasks without resting    Time  6    Period  Weeks    Status  On-going      PT LONG TERM GOAL #3   Title  PT to be able to don shoes and socks without difficulty    Time  6    Period  Weeks    Status  On-going      PT LONG TERM GOAL #4   Title  PT to have obtained and be using a compression pump,(order sent to MD today)    Time  6    Period  Weeks    Status  On-going      PT LONG TERM GOAL #5   Title  PT to have and be able to don and doff compression garment and to be able to verbalize she is to wear them everyday.    Time  6    Period  Weeks    Status  New             Plan - 03/02/19 1512    Clinical Impression Statement  Distal LE"s heavily indurated this session with 2 small blisters on either side of LE.  Extra time spent on manula this session both anterior and posteriorly.  Continued with bandaging.  Hoping her shipment will come in for additional bandaging to begin upper thighs.    Personal Factors and Comorbidities  Age;Comorbidity 3+    Comorbidities  CHF, CVI lymphedema    Examination-Activity Limitations  Dressing;Hygiene/Grooming;Stand;Locomotion Level;Other    Examination-Participation Restrictions  Community Activity    Stability/Clinical Decision Making  Evolving/Moderate complexity    Rehab Potential  Good    PT Frequency  3x / week    PT Duration  6 weeks    PT Treatment/Interventions  ADLs/Self Care Home Management;Manual techniques;Manual lymph drainage;Compression bandaging;Other (comment)   debridment   PT Next Visit Plan  Begin bandaging full thigh when short stretch obtained.  Continue with complete decongestive techniques.  Monitor blisters on distal Lt LE.    PT Home Exercise Plan  LE exercises to promote lymphatic circulation.    Consulted and Agree with Plan of Care  Patient       Patient will benefit from skilled therapeutic intervention in order to improve the following deficits and impairments:  Decreased skin integrity, Increased edema, Difficulty walking  Visit Diagnosis: Open wound of left lower leg with complication, initial encounter  Lymphedema, not elsewhere classified  Pain in left leg     Problem List Patient Active Problem List   Diagnosis Date Noted  . Venous stasis of both lower extremities 01/13/2019  . Generalized anxiety disorder  11/07/2018  . Non-rheumatic mitral regurgitation 03/20/2017  . Thrombocytopenia ([place]) 09/14/2016  . Hemoglobin decreased 09/14/2016  . Hearing loss 01/06/2015  . Major depressive disorder, recurrent episode, moderate ([place]) 06/07/2014  . Thoracic aorta  atherosclerosis ([place]) 03/04/2014  . Degenerative arthritis of thoracic spine 03/04/2014  . Osteoporosis, post-menopausal 08/12/2013  . Metabolic syndrome 18/40/3754  . Anemia, iron deficiency 03/16/2013  . Vitamin D deficiency 03/16/2013  . HTN (hypertension) 03/16/2013  . Hypothyroidism   . Diaphragmatic hernia without mention of obstruction or gangrene   . GERD (gastroesophageal reflux disease)   . Hyperlipidemia   . Prolapse of vaginal walls without mention of uterine prolapse   . Arthritis   . First degree atrioventricular block   . Symptomatic menopausal or female climacteric states   . AF (atrial fibrillation) ([place])   . Rectal polyp   . Gastric polyp    Teena Irani, PTA/CLT [phone]  Teena Irani 03/02/2019, 3:14 PM  [facility] [address] [place], [place], [address] Phone: [phone]   Fax:  [phone]  Name: [name] MRN: [id number] Date of Birth: 1933/03/05

## 2019-03-03 ENCOUNTER — Telehealth: Payer: Self-pay | Admitting: Family Medicine

## 2019-03-03 DIAGNOSIS — L84 Corns and callosities: Secondary | ICD-10-CM | POA: Diagnosis not present

## 2019-03-03 DIAGNOSIS — M79676 Pain in unspecified toe(s): Secondary | ICD-10-CM | POA: Diagnosis not present

## 2019-03-03 DIAGNOSIS — I70203 Unspecified atherosclerosis of native arteries of extremities, bilateral legs: Secondary | ICD-10-CM | POA: Diagnosis not present

## 2019-03-03 DIAGNOSIS — B351 Tinea unguium: Secondary | ICD-10-CM | POA: Diagnosis not present

## 2019-03-03 MED ORDER — POLYSACCHARIDE IRON COMPLEX 150 MG PO CAPS: 150.0000 mg | ORAL_CAPSULE | Freq: Every day | ORAL | 5 refills | Status: AC

## 2019-03-03 NOTE — Telephone Encounter (Signed)
What is the name of the medication? iron polysaccharides (FERREX 150) 150 MG capsule  Have you contacted your pharmacy to request a refill? no  Which pharmacy would you like this sent to? [facility]   Patient notified that their request is being sent to the clinical staff for review and that they should receive a call once it is complete. If they do not receive a call within 24 hours they can check with their pharmacy or our office.

## 2019-03-04 ENCOUNTER — Encounter (HOSPITAL_COMMUNITY): Payer: Self-pay | Admitting: Physical Therapy

## 2019-03-04 ENCOUNTER — Ambulatory Visit (HOSPITAL_COMMUNITY): Payer: Medicare Other | Attending: Family Medicine | Admitting: Physical Therapy

## 2019-03-04 ENCOUNTER — Other Ambulatory Visit: Payer: Self-pay

## 2019-03-04 DIAGNOSIS — M79605 Pain in left leg: Secondary | ICD-10-CM | POA: Insufficient documentation

## 2019-03-04 DIAGNOSIS — S81802A Unspecified open wound, left lower leg, initial encounter: Secondary | ICD-10-CM | POA: Insufficient documentation

## 2019-03-04 DIAGNOSIS — I890 Lymphedema, not elsewhere classified: Secondary | ICD-10-CM | POA: Insufficient documentation

## 2019-03-04 NOTE — Therapy (Signed)
[facility] [address] [place], [place], [address] Phone: [phone]   Fax:  [phone]  Physical Therapy Treatment  Patient Details  Name: [name] MRN: [id number] Date of Birth: 07/24/1932 Referring Provider (PT): Ronnie Doss   Encounter Date: 03/04/2019  PT End of Session - 03/04/19 1630    Visit Number  15    Number of Visits  18    Date for PT Re-Evaluation  03/06/19   reauth   Authorization Type  [employer] medicare/medicaid; progress note completed visit 10    Authorization Time Period  cert 01/7    Authorization - Visit Number  15    Authorization - Number of Visits  20    PT Start Time  5102    PT Stop Time  1605    PT Time Calculation (min)  80 min    Activity Tolerance  Patient tolerated treatment well    Behavior During Therapy  [facility] for tasks assessed/performed       Past Medical History:  Diagnosis Date  . AF (atrial fibrillation) ([place])   . Arthritis   . Diaphragmatic hernia without mention of obstruction or gangrene   . Esophagitis   . First degree atrioventricular block   . Gastric polyp   . GERD (gastroesophageal reflux disease)   . Heme positive stool   . Hyperlipidemia   . Hypertension   . Hypothyroid   . Prolapse of vaginal walls without mention of uterine prolapse   . Rectal polyp     Past Surgical History:  Procedure Laterality Date  . DILATION AND CURETTAGE OF UTERUS    . KIDNEY STONE SURGERY      There were no vitals filed for this visit.  Subjective Assessment - 03/04/19 1628    Subjective  PT states that she got part of her bandages in but not all of them, she hopes to have all of them by Friday.    Patient is accompained by:  Family member    Pertinent History  CHF, CVI    Limitations  Standing    How long can you sit comfortably?  no problem    How long can you stand comfortably?  5-10 min    How long can you walk comfortably?  Pt does not walk much    Currently in Pain?  Yes     Pain Score  4     Pain Location  Leg    Pain Orientation  Left    Pain Descriptors / Indicators  Tightness;Throbbing    Pain Onset  1 to 4 weeks ago                       [facility] Adult PT Treatment/Exercise - 03/04/19 0001      Manual Therapy   Manual Therapy  Compression Bandaging;Manual Lymphatic Drainage (MLD);Other (comment)    Manual therapy comments  LE cleansed and moisturized prior to treatment.     Manual Lymphatic Drainage (MLD)  anterior completed supine;  to include supraclavicular, deep and superficial abdominal with bilateral inguinal-axillary anastomosis followed by LE     Compression Bandaging  1/2" foam and multilayer short stretch toes to knee bilaterally    Other Manual Therapy  cut foam for thighs                PT Short Term Goals - 02/18/19 1730      PT SHORT TERM GOAL #1   Title  Wounds on Lt LE to no longer be weeping    Time  3    Period  Weeks    Status  Achieved    Target Date  02/13/19      PT SHORT TERM GOAL #2   Title  wounds to be 100% granulated    Time  2    Period  Weeks    Status  Achieved   was achieved but due to not having rx MOn. has blisters.   Target Date  02/06/19      PT SHORT TERM GOAL #3   Title  Pt wounds on her Lt LE to be decreased by 50% in size and there to no longer be any blisters on pt's Rt LE    Time  3    Period  Weeks    Status  Achieved   was met but now has new blisters since pt did not have rx Mon.   Target Date  02/13/19        PT Long Term Goals - 02/18/19 1733      PT LONG TERM GOAL #1   Title  All wounds to be healed    Time  6    Period  Weeks    Status  Achieved   was achieved now patient has two new wounds     PT LONG TERM GOAL #2   Title  Pt to have decreased 3-4 cm in volume B to allow pain to decrease to no greater than a 2/10 to be able to complete household tasks without resting    Time  6    Period  Weeks    Status  On-going      PT LONG TERM GOAL #3   Title   PT to be able to don shoes and socks without difficulty    Time  6    Period  Weeks    Status  On-going      PT LONG TERM GOAL #4   Title  PT to have obtained and be using a compression pump,(order sent to MD today)    Time  6    Period  Weeks    Status  On-going      PT LONG TERM GOAL #5   Title  PT to have and be able to don and doff compression garment and to be able to verbalize she is to wear them everyday.    Time  6    Period  Weeks    Status  New            Plan - 03/04/19 1631    Clinical Impression Statement  Blisters on LE have opened, Lt LE is healed but RT LE has two small open areas.  PT states she finally called [employer] who state that they are still waiting on MD signature.  PT volume most likely up due to clinic being closed Thursdayand Friday for Thanksgiving.    Personal Factors and Comorbidities  Age;Comorbidity 3+    Comorbidities  CHF, CVI lymphedema    Examination-Activity Limitations  Dressing;Hygiene/Grooming;Stand;Locomotion Level;Other    Examination-Participation Restrictions  Community Activity    Stability/Clinical Decision Making  Evolving/Moderate complexity    Rehab Potential  Good    PT Frequency  3x / week    PT Duration  6 weeks    PT Treatment/Interventions  ADLs/Self Care Home Management;Manual techniques;Manual lymph drainage;Compression bandaging;Other (comment)   debridment   PT Next Visit Plan  Begin   bandaging full thigh when short stretch obtained.  Continue with complete decongestive techniques.  Monitor wounds on distal LLE.    PT Home Exercise Plan  LE exercises to promote lymphatic circulation.    Consulted and Agree with Plan of Care  Patient       Patient will benefit from skilled therapeutic intervention in order to improve the following deficits and impairments:  Decreased skin integrity, Increased edema, Difficulty walking  Visit Diagnosis: Open wound of left lower leg with complication, initial encounter  Lymphedema,  not elsewhere classified  Pain in left leg     Problem List Patient Active Problem List   Diagnosis Date Noted  . Venous stasis of both lower extremities 01/13/2019  . Generalized anxiety disorder 11/07/2018  . Non-rheumatic mitral regurgitation 03/20/2017  . Thrombocytopenia ([facility]) 09/14/2016  . Hemoglobin decreased 09/14/2016  . Hearing loss 01/06/2015  . Major depressive disorder, recurrent episode, moderate ([facility]) 06/07/2014  . Thoracic aorta atherosclerosis ([facility]) 03/04/2014  . Degenerative arthritis of thoracic spine 03/04/2014  . Osteoporosis, post-menopausal 08/12/2013  . Metabolic syndrome 06/23/2013  . Anemia, iron deficiency 03/16/2013  . Vitamin D deficiency 03/16/2013  . HTN (hypertension) 03/16/2013  . Hypothyroidism   . Diaphragmatic hernia without mention of obstruction or gangrene   . GERD (gastroesophageal reflux disease)   . Hyperlipidemia   . Prolapse of vaginal walls without mention of uterine prolapse   . Arthritis   . First degree atrioventricular block   . Symptomatic menopausal or female climacteric states   . AF (atrial fibrillation) ([facility])   . Rectal polyp   . Gastric polyp   Cynthia Russell, PT CLT [phone] 03/04/2019, 4:35 PM  [facility] [address] [place], [place], [address] Phone: [phone]   Fax:  [phone]  Name: Alitza F Bates MRN: [id number] Date of Birth: 01/24/1933   

## 2019-03-06 ENCOUNTER — Other Ambulatory Visit: Payer: Self-pay

## 2019-03-06 ENCOUNTER — Encounter (HOSPITAL_COMMUNITY): Payer: Self-pay | Admitting: Physical Therapy

## 2019-03-06 ENCOUNTER — Telehealth (HOSPITAL_COMMUNITY): Payer: Self-pay | Admitting: Physical Therapy

## 2019-03-06 ENCOUNTER — Ambulatory Visit (HOSPITAL_COMMUNITY): Payer: Medicare Other | Admitting: Physical Therapy

## 2019-03-06 DIAGNOSIS — M79605 Pain in left leg: Secondary | ICD-10-CM | POA: Diagnosis not present

## 2019-03-06 DIAGNOSIS — S81802A Unspecified open wound, left lower leg, initial encounter: Secondary | ICD-10-CM

## 2019-03-06 DIAGNOSIS — I890 Lymphedema, not elsewhere classified: Secondary | ICD-10-CM

## 2019-03-06 NOTE — Telephone Encounter (Signed)
Therapist resent order for pump and wraps to [employer] as pt has not heard anything from the company at this time. [name], [place] CLT [phone]

## 2019-03-06 NOTE — Therapy (Signed)
[facility] [address] [place], [place], [address] Phone: [phone]   Fax:  [phone]  Physical Therapy Treatment  Patient Details  Name: [name] MRN: [id number] Date of Birth: 04-18-1932 Referring Provider (PT): Ronnie Doss Progress Note Reporting Period 01/23/2019 to 03/06/2019  See note below for Objective Data and Assessment of Progress/Goals.        Encounter Date: 03/06/2019  PT End of Session - 03/06/19 0955    Visit Number  16    Number of Visits  30    Date for PT Re-Evaluation  04/03/19   reauth   Authorization Type  [employer] medicare/medicaid; progress note completed visit 15    Authorization Time Period  cert 17/5    Authorization - Visit Number  70    Authorization - Number of Visits  30    PT Start Time  0822    PT Stop Time  0947    PT Time Calculation (min)  85 min    Activity Tolerance  Patient tolerated treatment well    Behavior During Therapy  [facility] for tasks assessed/performed       Past Medical History:  Diagnosis Date  . AF (atrial fibrillation) ([place])   . Arthritis   . Diaphragmatic hernia without mention of obstruction or gangrene   . Esophagitis   . First degree atrioventricular block   . Gastric polyp   . GERD (gastroesophageal reflux disease)   . Heme positive stool   . Hyperlipidemia   . Hypertension   . Hypothyroid   . Prolapse of vaginal walls without mention of uterine prolapse   . Rectal polyp     Past Surgical History:  Procedure Laterality Date  . DILATION AND CURETTAGE OF UTERUS    . KIDNEY STONE SURGERY      There were no vitals filed for this visit.  Subjective Assessment - 03/06/19 0947    Subjective  Pt states that a rash has appeared on her Lt thigh, no itching, pain or heat.  Therapist recommended just to keep an eye on it at this time.    Patient is accompained by:  Family member    Pertinent History  CHF, CVI    Limitations  Standing    How long can you  sit comfortably?  no problem    How long can you stand comfortably?  5-10 min    How long can you walk comfortably?  Pt does not walk much    Currently in Pain?  Yes    Pain Score  2     Pain Location  Leg    Pain Orientation  Left    Pain Descriptors / Indicators  Aching;Tightness    Pain Type  Chronic pain    Pain Onset  1 to 4 weeks ago    Pain Frequency  Intermittent    Aggravating Factors   standing    Pain Relieving Factors  elevation    Effect of Pain on Daily Activities  should limit but pt normally just works through the pain            LYMPHEDEMA/ONCOLOGY QUESTIONNAIRE - 03/06/19 0822      Right Lower Extremity Lymphedema   20 cm Proximal to Suprapatella  61.5 cm   was 59.5   10 cm Proximal to Suprapatella  61.3 cm   inital 52.3   At Midpatella/Popliteal Crease  52.3 cm   inital 47.9   30 cm Proximal to Floor  at Lateral Plantar Foot  47.3 cm   inital 39.7   20 cm Proximal to Floor at Lateral Plantar Foot  35.9 1   inital 29.8   10 cm Proximal to Floor at Lateral Malleoli  31.7 cm   initial 37.5   Circumference of ankle/heel  38.5 cm.   inital 37.5   5 cm Proximal to 1st MTP Joint  28 cm   inital 28   Across MTP Joint  24.7 cm   inital 27     Left Lower Extremity Lymphedema   20 cm Proximal to Suprapatella  63.5 cm   inital 61   10 cm Proximal to Suprapatella  62 cm   inital 58.6   At Midpatella/Popliteal Crease  54.7 cm   52   30 cm Proximal to Floor at Lateral Plantar Foot  49 cm   48   20 cm Proximal to Floor at Lateral Plantar Foot  39.7 cm   39.3   10 cm Proximal to Floor at Lateral Malleoli  31.2 cm   31.2   Circumference of ankle/heel  38.3 cm.   37.8   5 cm Proximal to 1st MTP Joint  28.2 cm   29   Across MTP Joint  26.6 cm   27.5               [facility] Adult PT Treatment/Exercise - 03/06/19 0001      Manual Therapy   Manual Therapy  Compression Bandaging;Manual Lymphatic Drainage (MLD);Other (comment)    Manual therapy  comments  LE cleansed and moisturized prior to treatment.     Manual Lymphatic Drainage (MLD)  anterior completed supine;  to include supraclavicular, deep and superficial abdominal with bilateral inguinal-axillary anastomosis followed by LE     Compression Bandaging  1/2" foam and multilayer short stretch toes to knee bilaterally    Other Manual Therapy  measurment               PT Short Term Goals - 02/18/19 1730      PT SHORT TERM GOAL #1   Title  Wounds on Lt LE to no longer be weeping    Time  3    Period  Weeks    Status  Achieved    Target Date  02/13/19      PT SHORT TERM GOAL #2   Title  wounds to be 100% granulated    Time  2    Period  Weeks    Status  Achieved   was achieved but due to not having rx MOn. has blisters.   Target Date  02/06/19      PT SHORT TERM GOAL #3   Title  Pt wounds on her Lt LE to be decreased by 50% in size and there to no longer be any blisters on pt's Rt LE    Time  3    Period  Weeks    Status  Achieved   was met but now has new blisters since pt did not have rx Mon.   Target Date  02/13/19        PT Long Term Goals - 03/06/19 1000      PT LONG TERM GOAL #1   Title  All wounds to be healed    Time  6    Period  Weeks    Status  Partially Met   down to one wound     PT LONG TERM GOAL #2   Title  Pt to have decreased 3-4 cm in volume B to allow pain to decrease to no greater than a 2/10 to be able to complete household tasks without resting    Time  6    Period  Weeks    Status  Partially Met      PT LONG TERM GOAL #3   Title  PT to be able to don shoes and socks without difficulty    Time  6    Period  Weeks    Status  Partially Met      PT LONG TERM GOAL #4   Title  PT to have obtained and be using a compression pump,(order sent to MD today)    Time  6    Period  Weeks    Status  On-going      PT LONG TERM GOAL #5   Title  PT to have and be able to don and doff compression garment and to be able to verbalize  she is to wear them everyday.    Time  6    Period  Weeks    Status  On-going            Plan - 03/06/19 0957    Clinical Impression Statement  Only one small wound on lateral aspect of LT LE remains.  Therapist placed xeroform on this area prior to bandaging with compression bandages.  PT brought the four bandages that had came in which were ideal binde therefore unable to bandage thighs today, foam for thighs remain under the cabinet in lymph room.  PT measured today with noted decreased in LT LE from last treatment; thigh is up from initial eval but LE is down significantly.  We have not been able to bandage the thigh area so this is to be expected.  Minimal decrease with Rt LE.    Personal Factors and Comorbidities  Age;Comorbidity 3+    Comorbidities  CHF, CVI lymphedema    Examination-Activity Limitations  Dressing;Hygiene/Grooming;Stand;Locomotion Level;Other    Examination-Participation Restrictions  Community Activity    Stability/Clinical Decision Making  Evolving/Moderate complexity    Rehab Potential  Good    PT Frequency  3x / week    PT Duration  Other (comment)   extend 4 more weeks for 10 weeks   PT Treatment/Interventions  ADLs/Self Care Home Management;Manual techniques;Manual lymph drainage;Compression bandaging;Other (comment)   debridment   PT Next Visit Plan  Begin bandaging full thigh when short stretch obtained.  Continue with complete decongestive techniques.  Monitor wounds on distal LLE.    PT Home Exercise Plan  LE exercises to promote lymphatic circulation.    Consulted and Agree with Plan of Care  Patient       Patient will benefit from skilled therapeutic intervention in order to improve the following deficits and impairments:  Decreased skin integrity, Increased edema, Difficulty walking  Visit Diagnosis: Open wound of left lower leg with complication, initial encounter  Lymphedema, not elsewhere classified  Pain in left leg     Problem  List Patient Active Problem List   Diagnosis Date Noted  . Venous stasis of both lower extremities 01/13/2019  . Generalized anxiety disorder 11/07/2018  . Non-rheumatic mitral regurgitation 03/20/2017  . Thrombocytopenia ([place]) 09/14/2016  . Hemoglobin decreased 09/14/2016  . Hearing loss 01/06/2015  . Major depressive disorder, recurrent episode, moderate ([place]) 06/07/2014  . Thoracic aorta atherosclerosis ([facility] [place]) 03/04/2014  . Degenerative arthritis of thoracic spine 03/04/2014  . Osteoporosis, post-menopausal 08/12/2013  .  Metabolic syndrome 05/63/7294  . Anemia, iron deficiency 03/16/2013  . Vitamin D deficiency 03/16/2013  . HTN (hypertension) 03/16/2013  . Hypothyroidism   . Diaphragmatic hernia without mention of obstruction or gangrene   . GERD (gastroesophageal reflux disease)   . Hyperlipidemia   . Prolapse of vaginal walls without mention of uterine prolapse   . Arthritis   . First degree atrioventricular block   . Symptomatic menopausal or female climacteric states   . AF (atrial fibrillation) ([place])   . Rectal polyp   . Gastric polyp    Rayetta Humphrey, PT CLT [phone] 03/06/2019, 10:03 AM  [facility] [place], [place], [address] Phone: [phone]   Fax:  [phone]  Name: DEANA KROCK MRN: [id number] Date of Birth: [birth year]

## 2019-03-09 ENCOUNTER — Ambulatory Visit (HOSPITAL_COMMUNITY): Payer: Medicare Other | Admitting: Physical Therapy

## 2019-03-09 ENCOUNTER — Other Ambulatory Visit: Payer: Self-pay

## 2019-03-09 ENCOUNTER — Encounter (HOSPITAL_COMMUNITY): Payer: Self-pay | Admitting: Physical Therapy

## 2019-03-09 DIAGNOSIS — M79605 Pain in left leg: Secondary | ICD-10-CM

## 2019-03-09 DIAGNOSIS — S81802A Unspecified open wound, left lower leg, initial encounter: Secondary | ICD-10-CM | POA: Diagnosis not present

## 2019-03-09 DIAGNOSIS — I890 Lymphedema, not elsewhere classified: Secondary | ICD-10-CM | POA: Diagnosis not present

## 2019-03-09 NOTE — Therapy (Signed)
[facility] [address] [place], [place], [address] Phone: [phone]   Fax:  [phone]  Physical Therapy Treatment  Patient Details  Name: [name] MRN: [id number] Date of Birth: [birth year] Referring Provider (PT): Ronnie Doss   Encounter Date: 03/09/2019  PT End of Session - 03/09/19 1433    Visit Number  17    Number of Visits  30    Date for PT Re-Evaluation  04/03/19   reauth   Authorization Type  [employer] medicare/medicaid; progress note completed visit 15    Authorization Time Period  cert 71/0    Authorization - Visit Number  50    Authorization - Number of Visits  30    PT Start Time  6269    PT Stop Time  1423    PT Time Calculation (min)  68 min    Activity Tolerance  Patient tolerated treatment well    Behavior During Therapy  [facility] for tasks assessed/performed       Past Medical History:  Diagnosis Date  . AF (atrial fibrillation) ([place])   . Arthritis   . Diaphragmatic hernia without mention of obstruction or gangrene   . Esophagitis   . First degree atrioventricular block   . Gastric polyp   . GERD (gastroesophageal reflux disease)   . Heme positive stool   . Hyperlipidemia   . Hypertension   . Hypothyroid   . Prolapse of vaginal walls without mention of uterine prolapse   . Rectal polyp     Past Surgical History:  Procedure Laterality Date  . DILATION AND CURETTAGE OF UTERUS    . KIDNEY STONE SURGERY      There were no vitals filed for this visit.  Subjective Assessment - 03/09/19 1431    Subjective  Pt states that her other bandages have not yet come in.  She is going to call Hulan Fray when she gets home to see where they are as she was suppost to have had them last week    Patient is accompained by:  Family member    Pertinent History  CHF, CVI    Limitations  Standing    How long can you sit comfortably?  no problem    How long can you stand comfortably?  5-10 min    How long can you walk  comfortably?  Pt does not walk much    Currently in Pain?  Yes    Pain Score  2     Pain Location  Leg    Pain Orientation  Left;Right    Pain Descriptors / Indicators  Tightness;Throbbing    Pain Onset  1 to 4 weeks ago                       [facility] Adult PT Treatment/Exercise - 03/09/19 0001      Manual Therapy   Manual Therapy  Compression Bandaging;Manual Lymphatic Drainage (MLD);Other (comment)    Manual therapy comments  LE cleansed and moisturized prior to treatment.     Manual Lymphatic Drainage (MLD)  anterior completed supine;  posterior side-lying  to include supraclavicular, deep and superficial abdominal with bilateral inguinal-axillary anastomosis followed by LE     Compression Bandaging  1/2" foam and multilayer short stretch toes to knee bilaterally               PT Short Term Goals - 02/18/19 1730      PT SHORT TERM GOAL #  1   Title  Wounds on Lt LE to no longer be weeping    Time  3    Period  Weeks    Status  Achieved    Target Date  02/13/19      PT SHORT TERM GOAL #2   Title  wounds to be 100% granulated    Time  2    Period  Weeks    Status  Achieved   was achieved but due to not having rx MOn. has blisters.   Target Date  02/06/19      PT SHORT TERM GOAL #3   Title  Pt wounds on her Lt LE to be decreased by 50% in size and there to no longer be any blisters on pt's Rt LE    Time  3    Period  Weeks    Status  Achieved   was met but now has new blisters since pt did not have rx Mon.   Target Date  02/13/19        PT Long Term Goals - 03/09/19 1435      PT LONG TERM GOAL #1   Title  All wounds to be healed    Time  6    Period  Weeks    Status  Achieved   down to one wound     PT LONG TERM GOAL #2   Title  Pt to have decreased 3-4 cm in volume B to allow pain to decrease to no greater than a 2/10 to be able to complete household tasks without resting    Time  6    Period  Weeks    Status  Partially Met      PT  LONG TERM GOAL #3   Title  PT to be able to don shoes and socks without difficulty    Time  6    Period  Weeks    Status  Partially Met      PT LONG TERM GOAL #4   Title  PT to have obtained and be using a compression pump,(order sent to MD today)    Time  6    Period  Weeks    Status  On-going      PT LONG TERM GOAL #5   Title  PT to have and be able to don and doff compression garment and to be able to verbalize she is to wear them everyday.    Time  6    Period  Weeks    Status  On-going            Plan - 03/09/19 1433    Clinical Impression Statement  No wounds present at this time on LE.  Pt continues to have induration in distal posterior thigh B which should respond well to thigh high bandaging as soon as pt is able to bring them in.    Personal Factors and Comorbidities  Age;Comorbidity 3+    Comorbidities  CHF, CVI lymphedema    Examination-Activity Limitations  Dressing;Hygiene/Grooming;Stand;Locomotion Level;Other    Examination-Participation Restrictions  Community Activity    Stability/Clinical Decision Making  Evolving/Moderate complexity    Rehab Potential  Good    PT Frequency  3x / week    PT Duration  Other (comment)   extend 4 more weeks for 10 weeks   PT Treatment/Interventions  ADLs/Self Care Home Management;Manual techniques;Manual lymph drainage;Compression bandaging;Other (comment)   debridment   PT Next Visit Plan  Begin bandaging  full thigh when short stretch obtained.  Continue with complete decongestive techniques.  .    PT Home Exercise Plan  LE exercises to promote lymphatic circulation.    Consulted and Agree with Plan of Care  Patient       Patient will benefit from skilled therapeutic intervention in order to improve the following deficits and impairments:  Decreased skin integrity, Increased edema, Difficulty walking  Visit Diagnosis: Open wound of left lower leg with complication, initial encounter  Pain in left leg  Lymphedema,  not elsewhere classified     Problem List Patient Active Problem List   Diagnosis Date Noted  . Venous stasis of both lower extremities 01/13/2019  . Generalized anxiety disorder 11/07/2018  . Non-rheumatic mitral regurgitation 03/20/2017  . Thrombocytopenia ([place]) 09/14/2016  . Hemoglobin decreased 09/14/2016  . Hearing loss 01/06/2015  . Major depressive disorder, recurrent episode, moderate ([place]) 06/07/2014  . Thoracic aorta atherosclerosis ([place]) 03/04/2014  . Degenerative arthritis of thoracic spine 03/04/2014  . Osteoporosis, post-menopausal 08/12/2013  . Metabolic syndrome 16/01/9603  . Anemia, iron deficiency 03/16/2013  . Vitamin D deficiency 03/16/2013  . HTN (hypertension) 03/16/2013  . Hypothyroidism   . Diaphragmatic hernia without mention of obstruction or gangrene   . GERD (gastroesophageal reflux disease)   . Hyperlipidemia   . Prolapse of vaginal walls without mention of uterine prolapse   . Arthritis   . First degree atrioventricular block   . Symptomatic menopausal or female climacteric states   . AF (atrial fibrillation) ([place])   . Rectal polyp   . Gastric polyp     Tonnya Garbett,CINDY 03/09/2019, 2:36 PM  [place], [place], [address] Phone: [phone]   Fax:  [phone]  Name: [name] MRN: [id number] Date of Birth: Dec 27, 1932

## 2019-03-11 ENCOUNTER — Other Ambulatory Visit: Payer: Self-pay

## 2019-03-11 ENCOUNTER — Ambulatory Visit (HOSPITAL_COMMUNITY): Payer: Medicare Other | Admitting: Physical Therapy

## 2019-03-11 ENCOUNTER — Encounter (HOSPITAL_COMMUNITY): Payer: Self-pay | Admitting: Physical Therapy

## 2019-03-11 DIAGNOSIS — S81802A Unspecified open wound, left lower leg, initial encounter: Secondary | ICD-10-CM | POA: Diagnosis not present

## 2019-03-11 DIAGNOSIS — I890 Lymphedema, not elsewhere classified: Secondary | ICD-10-CM | POA: Diagnosis not present

## 2019-03-11 DIAGNOSIS — M79605 Pain in left leg: Secondary | ICD-10-CM

## 2019-03-11 NOTE — Therapy (Signed)
[facility] [address] [place], [place], [address] Phone: [phone]   Fax:  [phone]  Physical Therapy Treatment  Patient Details  Name: [name] MRN: [id number] Date of Birth: [birth year] Referring Provider (PT): Ronnie Doss   Encounter Date: 03/11/2019  PT End of Session - 03/11/19 1626    Visit Number  18    Number of Visits  30    Date for PT Re-Evaluation  04/03/19   reauth   Authorization Type  [employer] medicare/medicaid; progress note completed visit 15    Authorization Time Period  cert 67/2    Authorization - Visit Number  18    Authorization - Number of Visits  30    PT Start Time  1400    PT Stop Time  1524    PT Time Calculation (min)  84 min    Activity Tolerance  Patient tolerated treatment well    Behavior During Therapy  [facility] for tasks assessed/performed       Past Medical History:  Diagnosis Date  . AF (atrial fibrillation) ([place])   . Arthritis   . Diaphragmatic hernia without mention of obstruction or gangrene   . Esophagitis   . First degree atrioventricular block   . Gastric polyp   . GERD (gastroesophageal reflux disease)   . Heme positive stool   . Hyperlipidemia   . Hypertension   . Hypothyroid   . Prolapse of vaginal walls without mention of uterine prolapse   . Rectal polyp     Past Surgical History:  Procedure Laterality Date  . DILATION AND CURETTAGE OF UTERUS    . KIDNEY STONE SURGERY      There were no vitals filed for this visit.  Subjective Assessment - 03/11/19 1624    Subjective  PT states that her bandages are suppose to be in by the end of the week.   Her legs seem smaller to her.    Patient is accompained by:  Family member    Pertinent History  CHF, CVI    Limitations  Standing    How long can you sit comfortably?  no problem    How long can you stand comfortably?  5-10 min    How long can you walk comfortably?  Pt does not walk much    Pain Onset  1 to 4 weeks ago             LYMPHEDEMA/ONCOLOGY QUESTIONNAIRE - 03/11/19 1403      Right Lower Extremity Lymphedema   20 cm Proximal to Suprapatella  62 cm  (Pended)     10 cm Proximal to Suprapatella  62.3 cm  (Pended)     At Midpatella/Popliteal Crease  53 cm  (Pended)     30 cm Proximal to Floor at Lateral Plantar Foot  45.5 cm  (Pended)     20 cm Proximal to Floor at Lateral Plantar Foot  34.3 1  (Pended)     10 cm Proximal to Floor at Lateral Malleoli  30.3 cm  (Pended)     Circumference of ankle/heel  37.8 cm.  (Pended)     5 cm Proximal to 1st MTP Joint  27.6 cm  (Pended)     Across MTP Joint  24.8 cm  (Pended)     Around Proximal Great Toe  8.3 cm  (Pended)       Left Lower Extremity Lymphedema   20 cm Proximal to Suprapatella  64.8 cm  (  Pended)     10 cm Proximal to Suprapatella  62.2 cm  (Pended)     At Midpatella/Popliteal Crease  54.5 cm  (Pended)     30 cm Proximal to Floor at Lateral Plantar Foot  47 cm  (Pended)     20 cm Proximal to Floor at Lateral Plantar Foot  38.5 cm  (Pended)     10 cm Proximal to Floor at Lateral Malleoli  31 cm  (Pended)     Circumference of ankle/heel  37.6 cm.  (Pended)     5 cm Proximal to 1st MTP Joint  28 cm  (Pended)     Across MTP Joint  26 cm  (Pended)     Around Proximal Great Toe  9.1 cm  (Pended)                 [facility] Adult PT Treatment/Exercise - 03/11/19 0001      Manual Therapy   Manual Therapy  Compression Bandaging;Manual Lymphatic Drainage (MLD);Other (comment)    Manual therapy comments  LE cleansed and moisturized prior to treatment.     Manual Lymphatic Drainage (MLD)  anterior completed supine;  posterior side-lying  to include supraclavicular, deep and superficial abdominal with bilateral inguinal-axillary anastomosis followed by LE     Compression Bandaging  1/2" foam and multilayer short stretch toes to thigh bilaterally    Other Manual Therapy  measurment               PT Short Term Goals - 02/18/19 1730       PT SHORT TERM GOAL #1   Title  Wounds on Lt LE to no longer be weeping    Time  3    Period  Weeks    Status  Achieved    Target Date  02/13/19      PT SHORT TERM GOAL #2   Title  wounds to be 100% granulated    Time  2    Period  Weeks    Status  Achieved   was achieved but due to not having rx MOn. has blisters.   Target Date  02/06/19      PT SHORT TERM GOAL #3   Title  Pt wounds on her Lt LE to be decreased by 50% in size and there to no longer be any blisters on pt's Rt LE    Time  3    Period  Weeks    Status  Achieved   was met but now has new blisters since pt did not have rx Mon.   Target Date  02/13/19        PT Long Term Goals - 03/09/19 1435      PT LONG TERM GOAL #1   Title  All wounds to be healed    Time  6    Period  Weeks    Status  Achieved   down to one wound     PT LONG TERM GOAL #2   Title  Pt to have decreased 3-4 cm in volume B to allow pain to decrease to no greater than a 2/10 to be able to complete household tasks without resting    Time  6    Period  Weeks    Status  Partially Met      PT LONG TERM GOAL #3   Title  PT to be able to don shoes and socks without difficulty    Time  6  Period  Weeks    Status  Partially Met      PT LONG TERM GOAL #4   Title  PT to have obtained and be using a compression pump,(order sent to MD today)    Time  6    Period  Weeks    Status  On-going      PT LONG TERM GOAL #5   Title  PT to have and be able to don and doff compression garment and to be able to verbalize she is to wear them everyday.    Time  6    Period  Weeks    Status  On-going            Plan - 03/11/19 1626    Clinical Impression Statement  Skin integrity of LE has improved.  PT measured with thigh volume slightly up but LE down B.  Therapist began thigh bandaging using foam, ideal binde and two short stretch bandages until pt bandages arrive.    Personal Factors and Comorbidities  Age;Comorbidity 3+    Comorbidities   CHF, CVI lymphedema    Examination-Activity Limitations  Dressing;Hygiene/Grooming;Stand;Locomotion Level;Other    Examination-Participation Restrictions  Community Activity    Stability/Clinical Decision Making  Evolving/Moderate complexity    Rehab Potential  Good    PT Frequency  3x / week    PT Duration  Other (comment)   extend 4 more weeks for 10 weeks   PT Treatment/Interventions  ADLs/Self Care Home Management;Manual techniques;Manual lymph drainage;Compression bandaging;Other (comment)   debridment   PT Next Visit Plan  Measure on Wednesdays, assess how pt did with bandaging to thigh level.    PT Home Exercise Plan  LE exercises to promote lymphatic circulation.    Consulted and Agree with Plan of Care  Patient       Patient will benefit from skilled therapeutic intervention in order to improve the following deficits and impairments:  Decreased skin integrity, Increased edema, Difficulty walking  Visit Diagnosis: Open wound of left lower leg with complication, initial encounter  Pain in left leg  Lymphedema, not elsewhere classified     Problem List Patient Active Problem List   Diagnosis Date Noted  . Venous stasis of both lower extremities 01/13/2019  . Generalized anxiety disorder 11/07/2018  . Non-rheumatic mitral regurgitation 03/20/2017  . Thrombocytopenia ([place]) 09/14/2016  . Hemoglobin decreased 09/14/2016  . Hearing loss 01/06/2015  . Major depressive disorder, recurrent episode, moderate ([place]) 06/07/2014  . Thoracic aorta atherosclerosis ([place]) 03/04/2014  . Degenerative arthritis of thoracic spine 03/04/2014  . Osteoporosis, post-menopausal 08/12/2013  . Metabolic syndrome 00/17/4944  . Anemia, iron deficiency 03/16/2013  . Vitamin D deficiency 03/16/2013  . HTN (hypertension) 03/16/2013  . Hypothyroidism   . Diaphragmatic hernia without mention of obstruction or gangrene   . GERD (gastroesophageal reflux disease)   . Hyperlipidemia   . Prolapse of  vaginal walls without mention of uterine prolapse   . Arthritis   . First degree atrioventricular block   . Symptomatic menopausal or female climacteric states   . AF (atrial fibrillation) ([place])   . Rectal polyp   . Gastric polyp    Rayetta Humphrey, PT CLT [phone] 03/11/2019, 4:29 PM  [facility] [address] [place], [place], [address] Phone: [phone]   Fax:  [phone]  Name: [name] MRN: [id number] Date of Birth: 1932/10/12

## 2019-03-13 ENCOUNTER — Other Ambulatory Visit: Payer: Self-pay

## 2019-03-13 ENCOUNTER — Ambulatory Visit (HOSPITAL_COMMUNITY): Payer: Medicare Other | Admitting: Physical Therapy

## 2019-03-13 ENCOUNTER — Encounter (HOSPITAL_COMMUNITY): Payer: Self-pay | Admitting: Physical Therapy

## 2019-03-13 DIAGNOSIS — I890 Lymphedema, not elsewhere classified: Secondary | ICD-10-CM

## 2019-03-13 DIAGNOSIS — M79605 Pain in left leg: Secondary | ICD-10-CM | POA: Diagnosis not present

## 2019-03-13 DIAGNOSIS — S81802A Unspecified open wound, left lower leg, initial encounter: Secondary | ICD-10-CM | POA: Diagnosis not present

## 2019-03-13 NOTE — Therapy (Signed)
[place], [place], [address] Phone: [phone]   Fax:  [phone]  Physical Therapy Treatment  Patient Details  Name: [name] MRN: [id number] Date of Birth: 10/02/1932 Referring Provider (PT): Ronnie Doss   Encounter Date: 03/13/2019  PT End of Session - 03/13/19 1604    Visit Number  19    Number of Visits  30    Date for PT Re-Evaluation  04/03/19   reauth   Authorization Type  [employer] medicare/medicaid; progress note completed visit 15    Authorization Time Period  cert 09/02/7856    Authorization - Visit Number  62    Authorization - Number of Visits  30    PT Start Time  1450    PT Stop Time  1600    PT Time Calculation (min)  70 min    Activity Tolerance  Patient tolerated treatment well    Behavior During Therapy  [facility] for tasks assessed/performed       Past Medical History:  Diagnosis Date  . AF (atrial fibrillation) ([place])   . Arthritis   . Diaphragmatic hernia without mention of obstruction or gangrene   . Esophagitis   . First degree atrioventricular block   . Gastric polyp   . GERD (gastroesophageal reflux disease)   . Heme positive stool   . Hyperlipidemia   . Hypertension   . Hypothyroid   . Prolapse of vaginal walls without mention of uterine prolapse   . Rectal polyp     Past Surgical History:  Procedure Laterality Date  . DILATION AND CURETTAGE OF UTERUS    . KIDNEY STONE SURGERY      There were no vitals filed for this visit.  Subjective Assessment - 03/13/19 1602    Subjective  Pt states it took awhile but she is getting use to being bandaged to the thigh level.  She called [facility] this morning, they have her pump and are waiting for the order for her juxtafit    Patient is accompained by:  Family member    Pertinent History  CHF, CVI    Limitations  Standing    How long can you sit comfortably?  no problem    How long can you stand comfortably?  5-10 min    How  long can you walk comfortably?  Pt does not walk much    Pain Onset  1 to 4 weeks ago               [facility] Adult PT Treatment/Exercise - 03/13/19 0001      Manual Therapy   Manual Therapy  Compression Bandaging;Manual Lymphatic Drainage (MLD);Other (comment)    Manual therapy comments  LE cleansed and moisturized prior to treatment.     Manual Lymphatic Drainage (MLD)  anterior completed only due to time to include supraclavicular, deep and superficial abdominal with bilateral inguinal-axillary anastomosis followed by LE     Compression Bandaging  1/2" foam and multilayer short stretch toes to thigh bilaterally               PT Short Term Goals - 02/18/19 1730      PT SHORT TERM GOAL #1   Title  Wounds on Lt LE to no longer be weeping    Time  3    Period  Weeks    Status  Achieved    Target Date  02/13/19      PT SHORT TERM GOAL #2  Title  wounds to be 100% granulated    Time  2    Period  Weeks    Status  Achieved   was achieved but due to not having rx MOn. has blisters.   Target Date  02/06/19      PT SHORT TERM GOAL #3   Title  Pt wounds on her Lt LE to be decreased by 50% in size and there to no longer be any blisters on pt's Rt LE    Time  3    Period  Weeks    Status  Achieved   was met but now has new blisters since pt did not have rx Mon.   Target Date  02/13/19        PT Long Term Goals - 03/09/19 1435      PT LONG TERM GOAL #1   Title  All wounds to be healed    Time  6    Period  Weeks    Status  Achieved   down to one wound     PT LONG TERM GOAL #2   Title  Pt to have decreased 3-4 cm in volume B to allow pain to decrease to no greater than a 2/10 to be able to complete household tasks without resting    Time  6    Period  Weeks    Status  Partially Met      PT LONG TERM GOAL #3   Title  PT to be able to don shoes and socks without difficulty    Time  6    Period  Weeks    Status  Partially Met      PT LONG TERM GOAL #4    Title  PT to have obtained and be using a compression pump,(order sent to MD today)    Time  6    Period  Weeks    Status  On-going      PT LONG TERM GOAL #5   Title  PT to have and be able to don and doff compression garment and to be able to verbalize she is to wear them everyday.    Time  6    Period  Weeks    Status  On-going            Plan - 03/13/19 1606    Clinical Impression Statement  Pt has less induration in posterior distal thigh now that therapist has started bandaging to the thigh level even though we are not using standard number of short stretch bandage. Pt ambulating with improved ease.    Personal Factors and Comorbidities  Age;Comorbidity 3+    Comorbidities  CHF, CVI lymphedema    Examination-Activity Limitations  Dressing;Hygiene/Grooming;Stand;Locomotion Level;Other    Examination-Participation Restrictions  Community Activity    Stability/Clinical Decision Making  Evolving/Moderate complexity    Rehab Potential  Good    PT Frequency  3x / week    PT Duration  Other (comment)   extend 4 more weeks for 10 weeks   PT Treatment/Interventions  ADLs/Self Care Home Management;Manual techniques;Manual lymph drainage;Compression bandaging;Other (comment)   debridment   PT Next Visit Plan  continue total decongestive techniques until maximul volume loss is obtained.  Measure on WEd.    PT Home Exercise Plan  LE exercises to promote lymphatic circulation.    Consulted and Agree with Plan of Care  Patient       Patient will benefit from skilled therapeutic intervention in order  to improve the following deficits and impairments:  Decreased skin integrity, Increased edema, Difficulty walking  Visit Diagnosis: Open wound of left lower leg with complication, initial encounter  Pain in left leg  Lymphedema, not elsewhere classified     Problem List Patient Active Problem List   Diagnosis Date Noted  . Venous stasis of both lower extremities 01/13/2019  .  Generalized anxiety disorder 11/07/2018  . Non-rheumatic mitral regurgitation 03/20/2017  . Thrombocytopenia ([place]) 09/14/2016  . Hemoglobin decreased 09/14/2016  . Hearing loss 01/06/2015  . Major depressive disorder, recurrent episode, moderate ([place]) 06/07/2014  . Thoracic aorta atherosclerosis ([place]) 03/04/2014  . Degenerative arthritis of thoracic spine 03/04/2014  . Osteoporosis, post-menopausal 08/12/2013  . Metabolic syndrome 03/00/9233  . Anemia, iron deficiency 03/16/2013  . Vitamin D deficiency 03/16/2013  . HTN (hypertension) 03/16/2013  . Hypothyroidism   . Diaphragmatic hernia without mention of obstruction or gangrene   . GERD (gastroesophageal reflux disease)   . Hyperlipidemia   . Prolapse of vaginal walls without mention of uterine prolapse   . Arthritis   . First degree atrioventricular block   . Symptomatic menopausal or female climacteric states   . AF (atrial fibrillation) ([place])   . Rectal polyp   . Gastric polyp    Rayetta Humphrey, PT CLT [phone] 03/13/2019, 4:09 PM  [facility] [address] [place], [place], [address] Phone: [phone]   Fax:  [phone]  Name: [name] MRN: [id number] Date of Birth: 1932-06-17

## 2019-03-16 ENCOUNTER — Ambulatory Visit (HOSPITAL_COMMUNITY): Payer: Medicare Other | Admitting: Physical Therapy

## 2019-03-16 ENCOUNTER — Other Ambulatory Visit: Payer: Self-pay

## 2019-03-16 DIAGNOSIS — M79605 Pain in left leg: Secondary | ICD-10-CM | POA: Diagnosis not present

## 2019-03-16 DIAGNOSIS — I890 Lymphedema, not elsewhere classified: Secondary | ICD-10-CM

## 2019-03-16 DIAGNOSIS — S81802A Unspecified open wound, left lower leg, initial encounter: Secondary | ICD-10-CM | POA: Diagnosis not present

## 2019-03-16 NOTE — Therapy (Signed)
[place], [place], [address] Phone: [phone]   Fax:  [phone]  Physical Therapy Treatment  Patient Details  Name: [name] MRN: [id number] Date of Birth: 01-09-1933 Referring Provider (PT): Ronnie Doss   Encounter Date: 03/16/2019  PT End of Session - 03/16/19 1257    Visit Number  20    Number of Visits  30    Date for PT Re-Evaluation  04/03/19   reauth   Authorization Type  [employer] medicare/medicaid; progress note completed visit 15    Authorization Time Period  cert 04/04/2438    Authorization - Visit Number  5    Authorization - Number of Visits  10    PT Start Time  1027    PT Stop Time  1205    PT Time Calculation (min)  87 min    Activity Tolerance  Patient tolerated treatment well    Behavior During Therapy  [facility] for tasks assessed/performed       Past Medical History:  Diagnosis Date  . AF (atrial fibrillation) ([place])   . Arthritis   . Diaphragmatic hernia without mention of obstruction or gangrene   . Esophagitis   . First degree atrioventricular block   . Gastric polyp   . GERD (gastroesophageal reflux disease)   . Heme positive stool   . Hyperlipidemia   . Hypertension   . Hypothyroid   . Prolapse of vaginal walls without mention of uterine prolapse   . Rectal polyp     Past Surgical History:  Procedure Laterality Date  . DILATION AND CURETTAGE OF UTERUS    . KIDNEY STONE SURGERY      There were no vitals filed for this visit.  Subjective Assessment - 03/16/19 1256    Subjective  pt states she cannot tolerate the toe bandaging.  STates she had to remove her rt one and her Lt one got all twisted around her toes.  Also with red blotchy "rubbing" areas on knees from bandages.    Currently in Pain?  No/denies                       [facility] Adult PT Treatment/Exercise - 03/16/19 0001      Manual Therapy   Manual Therapy  Compression Bandaging;Manual  Lymphatic Drainage (MLD);Other (comment)    Manual therapy comments  LE moisturized prior to treatment.     Manual Lymphatic Drainage (MLD)  anterior completed only due to time to include supraclavicular, deep and superficial abdominal with bilateral inguinal-axillary anastomosis followed by LE     Compression Bandaging  1/2" foam and multilayer short stretch toes to thigh bilaterally               PT Short Term Goals - 02/18/19 1730      PT SHORT TERM GOAL #1   Title  Wounds on Lt LE to no longer be weeping    Time  3    Period  Weeks    Status  Achieved    Target Date  02/13/19      PT SHORT TERM GOAL #2   Title  wounds to be 100% granulated    Time  2    Period  Weeks    Status  Achieved   was achieved but due to not having rx MOn. has blisters.   Target Date  02/06/19      PT SHORT TERM GOAL #3  Title  Pt wounds on her Lt LE to be decreased by 50% in size and there to no longer be any blisters on pt's Rt LE    Time  3    Period  Weeks    Status  Achieved   was met but now has new blisters since pt did not have rx Mon.   Target Date  02/13/19        PT Long Term Goals - 03/09/19 1435      PT LONG TERM GOAL #1   Title  All wounds to be healed    Time  6    Period  Weeks    Status  Achieved   down to one wound     PT LONG TERM GOAL #2   Title  Pt to have decreased 3-4 cm in volume B to allow pain to decrease to no greater than a 2/10 to be able to complete household tasks without resting    Time  6    Period  Weeks    Status  Partially Met      PT LONG TERM GOAL #3   Title  PT to be able to don shoes and socks without difficulty    Time  6    Period  Weeks    Status  Partially Met      PT LONG TERM GOAL #4   Title  PT to have obtained and be using a compression pump,(order sent to MD today)    Time  6    Period  Weeks    Status  On-going      PT LONG TERM GOAL #5   Title  PT to have and be able to don and doff compression garment and to be able  to verbalize she is to wear them everyday.    Time  6    Period  Weeks    Status  On-going            Plan - 03/16/19 1259    Clinical Impression Statement  pt originally only scehduled for 45 minutes but able to get pateint in for full treatment due to cancellations.  Manual completed for bilateral LE's supine and sidelying for posterior.  Noted red blockiness on Rt knee and superior Lt knee.  No signs of infection, no broken skin or noted.  Cut netting to fit full LE, #12 netting used.  LE's moisurized well and padded well at ankle and knees with cotton prior to bandaging.  Pt still awaiting bandaging and should get her pump and garment by the 21st.    Personal Factors and Comorbidities  Age;Comorbidity 3+    Comorbidities  CHF, CVI lymphedema    Examination-Activity Limitations  Dressing;Hygiene/Grooming;Stand;Locomotion Level;Other    Examination-Participation Restrictions  Community Activity    Stability/Clinical Decision Making  Evolving/Moderate complexity    Rehab Potential  Good    PT Frequency  3x / week    PT Duration  Other (comment)   extend 4 more weeks for 10 weeks   PT Treatment/Interventions  ADLs/Self Care Home Management;Manual techniques;Manual lymph drainage;Compression bandaging;Other (comment)   debridment   PT Next Visit Plan  continue total decongestive techniques until maximul volume loss is obtained.  Measure on WEd.    PT Home Exercise Plan  LE exercises to promote lymphatic circulation.    Consulted and Agree with Plan of Care  Patient       Patient will benefit from skilled therapeutic intervention in  order to improve the following deficits and impairments:  Decreased skin integrity, Increased edema, Difficulty walking  Visit Diagnosis: Pain in left leg  Lymphedema, not elsewhere classified  Open wound of left lower leg with complication, initial encounter     Problem List Patient Active Problem List   Diagnosis Date Noted  . Venous stasis  of both lower extremities 01/13/2019  . Generalized anxiety disorder 11/07/2018  . Non-rheumatic mitral regurgitation 03/20/2017  . Thrombocytopenia ([place]) 09/14/2016  . Hemoglobin decreased 09/14/2016  . Hearing loss 01/06/2015  . Major depressive disorder, recurrent episode, moderate ([place]) 06/07/2014  . Thoracic aorta atherosclerosis ([place]) 03/04/2014  . Degenerative arthritis of thoracic spine 03/04/2014  . Osteoporosis, post-menopausal 08/12/2013  . Metabolic syndrome 74/60/0298  . Anemia, iron deficiency 03/16/2013  . Vitamin D deficiency 03/16/2013  . HTN (hypertension) 03/16/2013  . Hypothyroidism   . Diaphragmatic hernia without mention of obstruction or gangrene   . GERD (gastroesophageal reflux disease)   . Hyperlipidemia   . Prolapse of vaginal walls without mention of uterine prolapse   . Arthritis   . First degree atrioventricular block   . Symptomatic menopausal or female climacteric states   . AF (atrial fibrillation) ([place])   . Rectal polyp   . Gastric polyp    Teena Irani, PTA/CLT [phone]  Teena Irani 03/16/2019, 1:03 PM  [facility] [address] [place], [place], [address] Phone: [phone]   Fax:  [phone]  Name: [name] MRN: [id number] Date of Birth: January 15, 1933

## 2019-03-18 ENCOUNTER — Ambulatory Visit: Payer: Medicare Other

## 2019-03-18 ENCOUNTER — Ambulatory Visit (HOSPITAL_COMMUNITY): Payer: Medicare Other | Admitting: Physical Therapy

## 2019-03-18 ENCOUNTER — Telehealth (HOSPITAL_COMMUNITY): Payer: Self-pay | Admitting: Physical Therapy

## 2019-03-18 DIAGNOSIS — I890 Lymphedema, not elsewhere classified: Secondary | ICD-10-CM | POA: Diagnosis not present

## 2019-03-18 NOTE — Telephone Encounter (Signed)
Pt cancelled because of the weather 

## 2019-03-19 ENCOUNTER — Ambulatory Visit (INDEPENDENT_AMBULATORY_CARE_PROVIDER_SITE_OTHER): Payer: Medicare Other

## 2019-03-19 ENCOUNTER — Other Ambulatory Visit: Payer: Self-pay

## 2019-03-19 DIAGNOSIS — E538 Deficiency of other specified B group vitamins: Secondary | ICD-10-CM | POA: Diagnosis not present

## 2019-03-19 NOTE — Progress Notes (Signed)
Cyanocobalamin injection given to left deltoid.  Patient tolerated well. 

## 2019-03-20 ENCOUNTER — Encounter (HOSPITAL_COMMUNITY): Payer: Self-pay | Admitting: Physical Therapy

## 2019-03-20 ENCOUNTER — Ambulatory Visit (HOSPITAL_COMMUNITY): Payer: Medicare Other | Admitting: Physical Therapy

## 2019-03-20 DIAGNOSIS — S81802A Unspecified open wound, left lower leg, initial encounter: Secondary | ICD-10-CM | POA: Diagnosis not present

## 2019-03-20 DIAGNOSIS — M79605 Pain in left leg: Secondary | ICD-10-CM | POA: Diagnosis not present

## 2019-03-20 DIAGNOSIS — I890 Lymphedema, not elsewhere classified: Secondary | ICD-10-CM

## 2019-03-20 NOTE — Therapy (Signed)
[place], [place], [address] Phone: [phone]   Fax:  [phone]  Physical Therapy Treatment  Patient Details  Name: [name] MRN: [id number] Date of Birth: [birth year] Referring Provider (PT): Ronnie Doss   Encounter Date: 03/20/2019  PT End of Session - 03/20/19 1311    Visit Number  21    Number of Visits  30    Date for PT Re-Evaluation  04/03/19   reauth   Authorization Type  [employer] medicare/medicaid; progress note completed visit 15    Authorization Time Period  cert 10/05/1605    Authorization - Visit Number  6    Authorization - Number of Visits  10    PT Start Time  3710    PT Stop Time  1445    PT Time Calculation (min)  90 min    Activity Tolerance  Patient tolerated treatment well    Behavior During Therapy  [facility] for tasks assessed/performed       Past Medical History:  Diagnosis Date  . AF (atrial fibrillation) ([place])   . Arthritis   . Diaphragmatic hernia without mention of obstruction or gangrene   . Esophagitis   . First degree atrioventricular block   . Gastric polyp   . GERD (gastroesophageal reflux disease)   . Heme positive stool   . Hyperlipidemia   . Hypertension   . Hypothyroid   . Prolapse of vaginal walls without mention of uterine prolapse   . Rectal polyp     Past Surgical History:  Procedure Laterality Date  . DILATION AND CURETTAGE OF UTERUS    . KIDNEY STONE SURGERY      There were no vitals filed for this visit.  Subjective Assessment - 03/20/19 1639    Subjective  Pt states she got her pump and juxtafit on Wed. evening.  She has not tried the pump yet.  Pt brought her juxta with her but not the short stretch.    Patient is accompained by:  Family member    Pertinent History  CHF, CVI    Limitations  Standing    How long can you sit comfortably?  no problem    How long can you stand comfortably?  5-10 min    How long can you walk comfortably?  Pt does  not walk much    Currently in Pain?  No/denies    Pain Onset  1 to 4 weeks ago            LYMPHEDEMA/ONCOLOGY QUESTIONNAIRE - 03/20/19 1313      Right Lower Extremity Lymphedema   20 cm Proximal to Suprapatella  62.5 cm    10 cm Proximal to Suprapatella  62 cm    At Midpatella/Popliteal Crease  52.4 cm    30 cm Proximal to Floor at Lateral Plantar Foot  47.2 cm    20 cm Proximal to Floor at Lateral Plantar Foot  36 1    10 cm Proximal to Floor at Lateral Malleoli  31.8 cm    Circumference of ankle/heel  39 cm.    5 cm Proximal to 1st MTP Joint  27.8 cm    Across MTP Joint  26 cm    Around Proximal Great Toe  8.8 cm      Left Lower Extremity Lymphedema   20 cm Proximal to Suprapatella  64 cm    10 cm Proximal to Suprapatella  61.5 cm    At Midpatella/Popliteal  Crease  55 cm    30 cm Proximal to Floor at Lateral Plantar Foot  47.4 cm    20 cm Proximal to Floor at Lateral Plantar Foot  38 cm    10 cm Proximal to Floor at Lateral Malleoli  32.8 cm    Circumference of ankle/heel  37.5 cm.    5 cm Proximal to 1st MTP Joint  28.5 cm    Across MTP Joint  27 cm    Around Proximal Great Toe  8.8 cm                [facility] Adult PT Treatment/Exercise - 03/20/19 0001      Manual Therapy   Manual Therapy  Compression Bandaging;Manual Lymphatic Drainage (MLD);Other (comment)    Manual therapy comments  LE moisturized prior to treatment.     Manual Lymphatic Drainage (MLD)  anterior completed supine;  posterior side-lying  to include supraclavicular, deep and superficial abdominal with bilateral inguinal-axillary anastomosis followed by LE     Compression Bandaging  instruction to pt on how to don juxta     Other Manual Therapy  measurement              PT Education - 03/20/19 1645    Education Details  extensive time taken explaining how to don juxta as pt lives by herself.    Person(s) Educated  Patient    Methods  Explanation;Demonstration    Comprehension   Verbalized understanding;Returned demonstration       PT Short Term Goals - 02/18/19 1730      PT SHORT TERM GOAL #1   Title  Wounds on Lt LE to no longer be weeping    Time  3    Period  Weeks    Status  Achieved    Target Date  02/13/19      PT SHORT TERM GOAL #2   Title  wounds to be 100% granulated    Time  2    Period  Weeks    Status  Achieved   was achieved but due to not having rx MOn. has blisters.   Target Date  02/06/19      PT SHORT TERM GOAL #3   Title  Pt wounds on her Lt LE to be decreased by 50% in size and there to no longer be any blisters on pt's Rt LE    Time  3    Period  Weeks    Status  Achieved   was met but now has new blisters since pt did not have rx Mon.   Target Date  02/13/19        PT Long Term Goals - 03/09/19 1435      PT LONG TERM GOAL #1   Title  All wounds to be healed    Time  6    Period  Weeks    Status  Achieved   down to one wound     PT LONG TERM GOAL #2   Title  Pt to have decreased 3-4 cm in volume B to allow pain to decrease to no greater than a 2/10 to be able to complete household tasks without resting    Time  6    Period  Weeks    Status  Partially Met      PT LONG TERM GOAL #3   Title  PT to be able to don shoes and socks without difficulty    Time  6  Period  Weeks    Status  Partially Met      PT LONG TERM GOAL #4   Title  PT to have obtained and be using a compression pump,(order sent to MD today)    Time  6    Period  Weeks    Status  On-going      PT LONG TERM GOAL #5   Title  PT to have and be able to don and doff compression garment and to be able to verbalize she is to wear them everyday.    Time  6    Period  Weeks    Status  On-going            Plan - 03/20/19 1642    Clinical Impression Statement  Pt now has pump and juxtafit, however pt only brought the thigh and foot piece of the juxta.  Instructed to bring all pieces next visit and to attempt to use the pump this weekend to see  if she has any questions. PT most likely will be ready for discharge next week after pt is able to demonstrate the ability to don Juxta as volumes have stabilized.    Personal Factors and Comorbidities  Age;Comorbidity 3+    Comorbidities  CHF, CVI lymphedema    Examination-Activity Limitations  Dressing;Hygiene/Grooming;Stand;Locomotion Level;Other    Examination-Participation Restrictions  Community Activity    Stability/Clinical Decision Making  Evolving/Moderate complexity    Rehab Potential  Good    PT Frequency  3x / week    PT Duration  Other (comment)   extend 4 more weeks for 10 weeks   PT Treatment/Interventions  ADLs/Self Care Home Management;Manual techniques;Manual lymph drainage;Compression bandaging;Other (comment)   debridment   PT Next Visit Plan  continue total decongestive techniques until maximul volume loss is obtained.  Measure on WEd if volume is not reducing discharge to self care.    PT Home Exercise Plan  LE exercises to promote lymphatic circulation.    Consulted and Agree with Plan of Care  Patient       Patient will benefit from skilled therapeutic intervention in order to improve the following deficits and impairments:  Decreased skin integrity, Increased edema, Difficulty walking  Visit Diagnosis: Pain in left leg  Lymphedema, not elsewhere classified  Open wound of left lower leg with complication, initial encounter     Problem List Patient Active Problem List   Diagnosis Date Noted  . Venous stasis of both lower extremities 01/13/2019  . Generalized anxiety disorder 11/07/2018  . Non-rheumatic mitral regurgitation 03/20/2017  . Thrombocytopenia ([place]) 09/14/2016  . Hemoglobin decreased 09/14/2016  . Hearing loss 01/06/2015  . Major depressive disorder, recurrent episode, moderate ([place]) 06/07/2014  . Thoracic aorta atherosclerosis ([place]) 03/04/2014  . Degenerative arthritis of thoracic spine 03/04/2014  . Osteoporosis, post-menopausal 08/12/2013   . Metabolic syndrome 35/46/5681  . Anemia, iron deficiency 03/16/2013  . Vitamin D deficiency 03/16/2013  . HTN (hypertension) 03/16/2013  . Hypothyroidism   . Diaphragmatic hernia without mention of obstruction or gangrene   . GERD (gastroesophageal reflux disease)   . Hyperlipidemia   . Prolapse of vaginal walls without mention of uterine prolapse   . Arthritis   . First degree atrioventricular block   . Symptomatic menopausal or female climacteric states   . AF (atrial fibrillation) ([place])   . Rectal polyp   . Gastric polyp     Rayetta Humphrey, PT CLT [phone] 03/20/2019, 4:46 PM  [facility]  [place], [place], [address] Phone: [phone]   Fax:  [phone]  Name: [name] MRN: [id number] Date of Birth: 11-Nov-1932

## 2019-03-23 ENCOUNTER — Other Ambulatory Visit: Payer: Self-pay

## 2019-03-23 ENCOUNTER — Ambulatory Visit (HOSPITAL_COMMUNITY): Payer: Medicare Other | Admitting: Physical Therapy

## 2019-03-23 DIAGNOSIS — M79605 Pain in left leg: Secondary | ICD-10-CM | POA: Diagnosis not present

## 2019-03-23 DIAGNOSIS — I890 Lymphedema, not elsewhere classified: Secondary | ICD-10-CM

## 2019-03-23 DIAGNOSIS — S81802A Unspecified open wound, left lower leg, initial encounter: Secondary | ICD-10-CM | POA: Diagnosis not present

## 2019-03-23 NOTE — Therapy (Signed)
[place], [place], [address] Phone: [phone]   Fax:  [phone]  Physical Therapy Treatment  Patient Details  Name: [name] MRN: [id number] Date of Birth: 1932/06/05 Referring Provider (PT): Ronnie Doss   Encounter Date: 03/23/2019  PT End of Session - 03/23/19 1609    Visit Number  22    Number of Visits  30    Date for PT Re-Evaluation  04/03/19   reauth   Authorization Type  [employer] medicare/medicaid; progress note completed visit 15    Authorization Time Period  cert 07/04/1538    Authorization - Visit Number  7    Authorization - Number of Visits  10    PT Start Time  1450    PT Stop Time  1615    PT Time Calculation (min)  85 min    Activity Tolerance  Patient tolerated treatment well    Behavior During Therapy  [facility] for tasks assessed/performed       Past Medical History:  Diagnosis Date  . AF (atrial fibrillation) ([place])   . Arthritis   . Diaphragmatic hernia without mention of obstruction or gangrene   . Esophagitis   . First degree atrioventricular block   . Gastric polyp   . GERD (gastroesophageal reflux disease)   . Heme positive stool   . Hyperlipidemia   . Hypertension   . Hypothyroid   . Prolapse of vaginal walls without mention of uterine prolapse   . Rectal polyp     Past Surgical History:  Procedure Laterality Date  . DILATION AND CURETTAGE OF UTERUS    . KIDNEY STONE SURGERY      There were no vitals filed for this visit.  Subjective Assessment - 03/23/19 1607    Subjective  pt comes today with juxtafits. STates she uses her pump in the morning yesterday and again last night.  Sttates she also used it this morning.  No issues today.                       [facility] Adult PT Treatment/Exercise - 03/23/19 0001      Manual Therapy   Manual Therapy  Compression Bandaging;Manual Lymphatic Drainage (MLD);Other (comment)    Manual Lymphatic Drainage (MLD)   anterior completed supine;  posterior side-lying  to include supraclavicular, deep and superficial abdominal with bilateral inguinal-axillary anastomosis followed by LE     Compression Bandaging  instruction to pt on how to don juxta                PT Short Term Goals - 02/18/19 1730      PT SHORT TERM GOAL #1   Title  Wounds on Lt LE to no longer be weeping    Time  3    Period  Weeks    Status  Achieved    Target Date  02/13/19      PT SHORT TERM GOAL #2   Title  wounds to be 100% granulated    Time  2    Period  Weeks    Status  Achieved   was achieved but due to not having rx MOn. has blisters.   Target Date  02/06/19      PT SHORT TERM GOAL #3   Title  Pt wounds on her Lt LE to be decreased by 50% in size and there to no longer be any blisters on pt's Rt LE  Time  3    Period  Weeks    Status  Achieved   was met but now has new blisters since pt did not have rx Mon.   Target Date  02/13/19        PT Long Term Goals - 03/09/19 1435      PT LONG TERM GOAL #1   Title  All wounds to be healed    Time  6    Period  Weeks    Status  Achieved   down to one wound     PT LONG TERM GOAL #2   Title  Pt to have decreased 3-4 cm in volume B to allow pain to decrease to no greater than a 2/10 to be able to complete household tasks without resting    Time  6    Period  Weeks    Status  Partially Met      PT LONG TERM GOAL #3   Title  PT to be able to don shoes and socks without difficulty    Time  6    Period  Weeks    Status  Partially Met      PT LONG TERM GOAL #4   Title  PT to have obtained and be using a compression pump,(order sent to MD today)    Time  6    Period  Weeks    Status  On-going      PT LONG TERM GOAL #5   Title  PT to have and be able to don and doff compression garment and to be able to verbalize she is to wear them everyday.    Time  6    Period  Weeks    Status  On-going            Plan - 03/23/19 1613    Clinical  Impression Statement  Pt using her pump consecutively the past 2 days.  Completed manual this session with still heavily indurated legs bilaterally.  Pt required guidance to place juxtas in correct order and proper alignment of foot pieces.  Pt demonstated good ability to apply compressive forces when strapping and good technique.  Pt to be measured next visit and if volumes have stabilzied will be ready for discharge.    Personal Factors and Comorbidities  Age;Comorbidity 3+    Comorbidities  CHF, CVI lymphedema    Examination-Activity Limitations  Dressing;Hygiene/Grooming;Stand;Locomotion Level;Other    Examination-Participation Restrictions  Community Activity    Stability/Clinical Decision Making  Evolving/Moderate complexity    Rehab Potential  Good    PT Frequency  3x / week    PT Duration  Other (comment)   extend 4 more weeks for 10 weeks   PT Treatment/Interventions  ADLs/Self Care Home Management;Manual techniques;Manual lymph drainage;Compression bandaging;Other (comment)   debridment   PT Next Visit Plan  continue total decongestive techniques until maximul volume loss is obtained.  Measure on WEd if volume is not reducing discharge to self care.    PT Home Exercise Plan  LE exercises to promote lymphatic circulation.    Consulted and Agree with Plan of Care  Patient       Patient will benefit from skilled therapeutic intervention in order to improve the following deficits and impairments:  Decreased skin integrity, Increased edema, Difficulty walking  Visit Diagnosis: Pain in left leg  Lymphedema, not elsewhere classified  Open wound of left lower leg with complication, initial encounter     Problem List Patient  Active Problem List   Diagnosis Date Noted  . Venous stasis of both lower extremities 01/13/2019  . Generalized anxiety disorder 11/07/2018  . Non-rheumatic mitral regurgitation 03/20/2017  . Thrombocytopenia ([place]) 09/14/2016  . Hemoglobin decreased  09/14/2016  . Hearing loss 01/06/2015  . Major depressive disorder, recurrent episode, moderate ([place]) 06/07/2014  . Thoracic aorta atherosclerosis ([place]) 03/04/2014  . Degenerative arthritis of thoracic spine 03/04/2014  . Osteoporosis, post-menopausal 08/12/2013  . Metabolic syndrome 29/00/9446  . Anemia, iron deficiency 03/16/2013  . Vitamin D deficiency 03/16/2013  . HTN (hypertension) 03/16/2013  . Hypothyroidism   . Diaphragmatic hernia without mention of obstruction or gangrene   . GERD (gastroesophageal reflux disease)   . Hyperlipidemia   . Prolapse of vaginal walls without mention of uterine prolapse   . Arthritis   . First degree atrioventricular block   . Symptomatic menopausal or female climacteric states   . AF (atrial fibrillation) ([place])   . Rectal polyp   . Gastric polyp    Teena Irani, PTA/CLT [phone]  Teena Irani 03/23/2019, 4:18 PM  [facility] [address] [place], [place], [address] Phone: [phone]   Fax:  [phone]  Name: MELANIA KIRKS MRN: [id number] Date of Birth: May 24, 1932

## 2019-03-25 ENCOUNTER — Ambulatory Visit (HOSPITAL_COMMUNITY): Payer: Medicare Other | Admitting: Physical Therapy

## 2019-03-25 ENCOUNTER — Other Ambulatory Visit: Payer: Self-pay

## 2019-03-25 DIAGNOSIS — M79605 Pain in left leg: Secondary | ICD-10-CM

## 2019-03-25 DIAGNOSIS — S81802A Unspecified open wound, left lower leg, initial encounter: Secondary | ICD-10-CM | POA: Diagnosis not present

## 2019-03-25 DIAGNOSIS — I890 Lymphedema, not elsewhere classified: Secondary | ICD-10-CM

## 2019-03-25 NOTE — Therapy (Signed)
[facility] [address] [place], [place], [address] Phone: [phone]   Fax:  [phone]  Physical Therapy Treatment  Patient Details  Name: [name] MRN: [id number] Date of Birth: 14-Jan-1933 Referring Provider (PT): Ronnie Doss  PHYSICAL THERAPY DISCHARGE SUMMARY  Visits from Start of Care: 23  Current functional level related to goals / functional outcomes: See below   Remaining deficits: See below   Education / Equipment: Self daily care for LE lymphedema/donning juxtafit and using compression pump  Plan: Patient agrees to discharge.  Patient goals were not met. Patient is being discharged due to lack of progress.  ?????       Encounter Date: 03/25/2019  PT End of Session - 03/25/19 1621    Visit Number  23    Number of Visits 23    Date for PT Re-Evaluation  04/03/19   reauth   Authorization Type  [employer] medicare/medicaid; progress note completed visit 15    Authorization Time Period  cert 08/07/2618    Authorization - Visit Number  7    Authorization - Number of Visits  10    PT Start Time  1448    PT Stop Time  1615    PT Time Calculation (min)  87 min    Activity Tolerance  Patient tolerated treatment well    Behavior During Therapy  WFL for tasks assessed/performed       Past Medical History:  Diagnosis Date  . AF (atrial fibrillation) ([place])   . Arthritis   . Diaphragmatic hernia without mention of obstruction or gangrene   . Esophagitis   . First degree atrioventricular block   . Gastric polyp   . GERD (gastroesophageal reflux disease)   . Heme positive stool   . Hyperlipidemia   . Hypertension   . Hypothyroid   . Prolapse of vaginal walls without mention of uterine prolapse   . Rectal polyp     Past Surgical History:  Procedure Laterality Date  . DILATION AND CURETTAGE OF UTERUS    . KIDNEY STONE SURGERY      There were no vitals filed for this visit.  Subjective Assessment -  03/25/19 1613    Subjective  Pt states that she has not used the pump this morning but she is going to this evening.  Pt comes without juxtafit being donned stating that she did not know that she needed to have them on.    Patient is accompained by:  Family member    Pertinent History  CHF, CVI    Limitations  Standing    How long can you sit comfortably?  no problem    How long can you stand comfortably?  20-30 min was 3-10    How long can you walk comfortably?  Pt does not walk much    Pain Onset  1 to 4 weeks ago            LYMPHEDEMA/ONCOLOGY QUESTIONNAIRE - 03/25/19 1455      Right Lower Extremity Lymphedema   20 cm Proximal to Suprapatella  62.5 cm   was 59.5 pt up making pies this am without compression   10 cm Proximal to Suprapatella  58.5 cm   57.5   At Midpatella/Popliteal Crease  53.5 cm   52.3   30 cm Proximal to Floor at Lateral Plantar Foot  47 cm   47.9   20 cm Proximal to Floor at Lateral Plantar Foot  35.7 1  39.7   10 cm Proximal to Floor at Lateral Malleoli  30.8 cm   29.8   Circumference of ankle/heel  38 cm.   37.5   5 cm Proximal to 1st MTP Joint  28.2 cm   28   Across MTP Joint  25.6 cm   27   Around Proximal Great Toe  8.2 cm   8.5     Left Lower Extremity Lymphedema   20 cm Proximal to Suprapatella  64 cm   was 64   10 cm Proximal to Suprapatella  62.8 cm   58.6   At Midpatella/Popliteal Crease  54.2 cm   52   30 cm Proximal to Floor at Lateral Plantar Foot  47.7 cm   48   20 cm Proximal to Floor at Lateral Plantar Foot  39.2 cm   39.3   10 cm Proximal to Floor at Lateral Malleoli  32 cm   31.2   Circumference of ankle/heel  37.3 cm.   37.8   5 cm Proximal to 1st MTP Joint  28 cm   29   Across MTP Joint  27.2 cm   27.5   Around Proximal Great Toe  9 cm   8.7                       PT Education - 03/25/19 1621    Education Details  educated pt on daily care for lymphedema including needing to wear her  compression all day and pumping at least once a day, donning of juxtafit    Person(s) Educated  Patient    Methods  Explanation;Demonstration;Handout    Comprehension  Verbalized understanding;Returned demonstration       PT Short Term Goals - 03/25/19 1626      PT SHORT TERM GOAL #1   Title  Wounds on Lt LE to no longer be weeping    Time  3    Period  Weeks    Status  Achieved    Target Date  02/13/19      PT SHORT TERM GOAL #2   Title  wounds to be 100% granulated    Time  2    Period  Weeks    Status  Achieved    Target Date  02/06/19      PT SHORT TERM GOAL #3   Title  Pt wounds on her Lt LE to be decreased by 50% in size and there to no longer be any blisters on pt's Rt LE    Time  3    Period  Weeks    Status  Achieved   was met but now has new blisters since pt did not have rx Mon.   Target Date  02/13/19        PT Long Term Goals - 03/25/19 1627      PT LONG TERM GOAL #1   Title  All wounds to be healed    Time  6    Period  Weeks    Status  Achieved      PT LONG TERM GOAL #2   Title  Pt to have decreased 3-4 cm in volume B to allow pain to decrease to no greater than a 2/10 to be able to complete household tasks without resting    Time  6    Period  Weeks    Status  Not Met      PT LONG TERM GOAL #3  Title  PT to be able to don shoes and socks without difficulty    Time  6    Period  Weeks    Status  Achieved      PT LONG TERM GOAL #4   Title  PT to have obtained and be using a compression pump,(order sent to MD today)    Time  6    Period  Weeks    Status  Achieved      PT LONG TERM GOAL #5   Title  PT to have and be able to don and doff compression garment and to be able to verbalize she is to wear them everyday.    Time  6    Period  Weeks    Status  Partially Met            Plan - 03/25/19 1622    Clinical Impression Statement  Therapist called [facility] to let them know that pt will need toe caps.  PT measured with limited  improvement in volume but skin integrity is much improved with no wounds or weeping.  Pt has her pump and is able to mod I don her juxtafit.  Pt encouraged to wear her compression everyday as therapist feels this is why pt volumes are not changing significantly.  Pt voiced understanding.  Pt is ready for discharge at this time.    Personal Factors and Comorbidities  Age;Comorbidity 3+    Comorbidities  CHF, CVI lymphedema    Examination-Activity Limitations  Dressing;Hygiene/Grooming;Stand;Locomotion Level;Other    Examination-Participation Restrictions  Community Activity    Stability/Clinical Decision Making  Evolving/Moderate complexity    Rehab Potential  Good    PT Frequency  3x / week    PT Duration  Other (comment)   extend 4 more weeks for 10 weeks   PT Treatment/Interventions  ADLs/Self Care Home Management;Manual techniques;Manual lymph drainage;Compression bandaging;Other (comment)   debridment   PT Next Visit Plan  discharge pt.    PT Home Exercise Plan  LE exercises to promote lymphatic circulation.    Consulted and Agree with Plan of Care  Patient       Patient will benefit from skilled therapeutic intervention in order to improve the following deficits and impairments:  Decreased skin integrity, Increased edema, Difficulty walking  Visit Diagnosis: Pain in left leg  Lymphedema, not elsewhere classified     Problem List Patient Active Problem List   Diagnosis Date Noted  . Venous stasis of both lower extremities 01/13/2019  . Generalized anxiety disorder 11/07/2018  . Non-rheumatic mitral regurgitation 03/20/2017  . Thrombocytopenia ([place]) 09/14/2016  . Hemoglobin decreased 09/14/2016  . Hearing loss 01/06/2015  . Major depressive disorder, recurrent episode, moderate ([place]) 06/07/2014  . Thoracic aorta atherosclerosis ([place]) 03/04/2014  . Degenerative arthritis of thoracic spine 03/04/2014  . Osteoporosis, post-menopausal 08/12/2013  . Metabolic syndrome  27/25/3664  . Anemia, iron deficiency 03/16/2013  . Vitamin D deficiency 03/16/2013  . HTN (hypertension) 03/16/2013  . Hypothyroidism   . Diaphragmatic hernia without mention of obstruction or gangrene   . GERD (gastroesophageal reflux disease)   . Hyperlipidemia   . Prolapse of vaginal walls without mention of uterine prolapse   . Arthritis   . First degree atrioventricular block   . Symptomatic menopausal or female climacteric states   . AF (atrial fibrillation) ([place])   . Rectal polyp   . Gastric polyp     Rayetta Humphrey, PT CLT [phone] 03/25/2019, 4:28 PM  [place]  [facility] [place], [place], [address] Phone: [phone]   Fax:  [phone]  Name: [name] MRN: [id number] Date of Birth: Jun 08, 1932

## 2019-03-26 ENCOUNTER — Ambulatory Visit (HOSPITAL_COMMUNITY): Payer: Medicare Other | Admitting: Physical Therapy

## 2019-04-01 ENCOUNTER — Encounter (HOSPITAL_COMMUNITY): Payer: Medicare Other | Admitting: Physical Therapy

## 2019-04-14 ENCOUNTER — Telehealth: Payer: Self-pay | Admitting: *Deleted

## 2019-04-14 NOTE — Telephone Encounter (Signed)
Unable to leave a message, no voicemail.  

## 2019-04-17 ENCOUNTER — Other Ambulatory Visit: Payer: Self-pay

## 2019-04-20 ENCOUNTER — Ambulatory Visit (INDEPENDENT_AMBULATORY_CARE_PROVIDER_SITE_OTHER): Payer: Medicare Other | Admitting: *Deleted

## 2019-04-20 ENCOUNTER — Other Ambulatory Visit: Payer: Self-pay

## 2019-04-20 DIAGNOSIS — E538 Deficiency of other specified B group vitamins: Secondary | ICD-10-CM

## 2019-04-20 NOTE — Progress Notes (Signed)
B12 injection given and tolerated well.  

## 2019-04-24 ENCOUNTER — Other Ambulatory Visit: Payer: Self-pay | Admitting: Family Medicine

## 2019-04-24 DIAGNOSIS — K2100 Gastro-esophageal reflux disease with esophagitis, without bleeding: Secondary | ICD-10-CM

## 2019-04-24 DIAGNOSIS — I10 Essential (primary) hypertension: Secondary | ICD-10-CM

## 2019-05-12 DIAGNOSIS — I70203 Unspecified atherosclerosis of native arteries of extremities, bilateral legs: Secondary | ICD-10-CM | POA: Diagnosis not present

## 2019-05-12 DIAGNOSIS — L84 Corns and callosities: Secondary | ICD-10-CM | POA: Diagnosis not present

## 2019-05-12 DIAGNOSIS — B351 Tinea unguium: Secondary | ICD-10-CM | POA: Diagnosis not present

## 2019-05-12 DIAGNOSIS — M79676 Pain in unspecified toe(s): Secondary | ICD-10-CM | POA: Diagnosis not present

## 2019-05-14 ENCOUNTER — Other Ambulatory Visit: Payer: Self-pay | Admitting: Family Medicine

## 2019-05-14 ENCOUNTER — Other Ambulatory Visit: Payer: Self-pay | Admitting: Cardiology

## 2019-05-19 ENCOUNTER — Ambulatory Visit (INDEPENDENT_AMBULATORY_CARE_PROVIDER_SITE_OTHER): Payer: Medicare Other | Admitting: Family

## 2019-05-19 ENCOUNTER — Other Ambulatory Visit: Payer: Self-pay

## 2019-05-19 ENCOUNTER — Encounter (HOSPITAL_COMMUNITY): Payer: Self-pay

## 2019-05-19 ENCOUNTER — Encounter: Payer: Self-pay | Admitting: Family

## 2019-05-19 ENCOUNTER — Inpatient Hospital Stay (HOSPITAL_COMMUNITY)
Admission: EM | Admit: 2019-05-19 | Discharge: 2019-06-02 | DRG: 291 | Disposition: A | Discharge status: Skilled Nursing Facility | Payer: Medicare Other | Attending: Internal Medicine | Admitting: Internal Medicine

## 2019-05-19 ENCOUNTER — Emergency Department (HOSPITAL_COMMUNITY): Payer: Medicare Other

## 2019-05-19 VITALS — BP 148/86 | HR 82 | Temp 97.3°F | Ht 64.0 in | Wt 210.8 lb

## 2019-05-19 DIAGNOSIS — K220 Achalasia of cardia: Secondary | ICD-10-CM | POA: Diagnosis not present

## 2019-05-19 DIAGNOSIS — J9620 Acute and chronic respiratory failure, unspecified whether with hypoxia or hypercapnia: Secondary | ICD-10-CM | POA: Diagnosis not present

## 2019-05-19 DIAGNOSIS — Z883 Allergy status to other anti-infective agents status: Secondary | ICD-10-CM

## 2019-05-19 DIAGNOSIS — I130 Hypertensive heart and chronic kidney disease with heart failure and stage 1 through stage 4 chronic kidney disease, or unspecified chronic kidney disease: Principal | ICD-10-CM | POA: Diagnosis present

## 2019-05-19 DIAGNOSIS — I878 Other specified disorders of veins: Secondary | ICD-10-CM | POA: Diagnosis present

## 2019-05-19 DIAGNOSIS — D539 Nutritional anemia, unspecified: Secondary | ICD-10-CM | POA: Diagnosis not present

## 2019-05-19 DIAGNOSIS — I350 Nonrheumatic aortic (valve) stenosis: Secondary | ICD-10-CM | POA: Diagnosis not present

## 2019-05-19 DIAGNOSIS — E034 Atrophy of thyroid (acquired): Secondary | ICD-10-CM | POA: Diagnosis not present

## 2019-05-19 DIAGNOSIS — J811 Chronic pulmonary edema: Secondary | ICD-10-CM | POA: Diagnosis not present

## 2019-05-19 DIAGNOSIS — E785 Hyperlipidemia, unspecified: Secondary | ICD-10-CM | POA: Diagnosis not present

## 2019-05-19 DIAGNOSIS — I4821 Permanent atrial fibrillation: Secondary | ICD-10-CM | POA: Diagnosis present

## 2019-05-19 DIAGNOSIS — Z888 Allergy status to other drugs, medicaments and biological substances status: Secondary | ICD-10-CM

## 2019-05-19 DIAGNOSIS — Z7901 Long term (current) use of anticoagulants: Secondary | ICD-10-CM

## 2019-05-19 DIAGNOSIS — I87303 Chronic venous hypertension (idiopathic) without complications of bilateral lower extremity: Secondary | ICD-10-CM | POA: Diagnosis not present

## 2019-05-19 DIAGNOSIS — Z66 Do not resuscitate: Secondary | ICD-10-CM | POA: Diagnosis not present

## 2019-05-19 DIAGNOSIS — Z79899 Other long term (current) drug therapy: Secondary | ICD-10-CM

## 2019-05-19 DIAGNOSIS — E8770 Fluid overload, unspecified: Secondary | ICD-10-CM | POA: Diagnosis present

## 2019-05-19 DIAGNOSIS — Z886 Allergy status to analgesic agent status: Secondary | ICD-10-CM

## 2019-05-19 DIAGNOSIS — J90 Pleural effusion, not elsewhere classified: Secondary | ICD-10-CM | POA: Diagnosis not present

## 2019-05-19 DIAGNOSIS — I351 Nonrheumatic aortic (valve) insufficiency: Secondary | ICD-10-CM | POA: Diagnosis not present

## 2019-05-19 DIAGNOSIS — R2689 Other abnormalities of gait and mobility: Secondary | ICD-10-CM | POA: Diagnosis not present

## 2019-05-19 DIAGNOSIS — Z79890 Hormone replacement therapy (postmenopausal): Secondary | ICD-10-CM

## 2019-05-19 DIAGNOSIS — K219 Gastro-esophageal reflux disease without esophagitis: Secondary | ICD-10-CM | POA: Diagnosis present

## 2019-05-19 DIAGNOSIS — Z515 Encounter for palliative care: Secondary | ICD-10-CM | POA: Diagnosis present

## 2019-05-19 DIAGNOSIS — J9621 Acute and chronic respiratory failure with hypoxia: Secondary | ICD-10-CM | POA: Diagnosis present

## 2019-05-19 DIAGNOSIS — Z7189 Other specified counseling: Secondary | ICD-10-CM

## 2019-05-19 DIAGNOSIS — I4891 Unspecified atrial fibrillation: Secondary | ICD-10-CM | POA: Diagnosis not present

## 2019-05-19 DIAGNOSIS — Z8249 Family history of ischemic heart disease and other diseases of the circulatory system: Secondary | ICD-10-CM

## 2019-05-19 DIAGNOSIS — M6281 Muscle weakness (generalized): Secondary | ICD-10-CM | POA: Diagnosis not present

## 2019-05-19 DIAGNOSIS — K449 Diaphragmatic hernia without obstruction or gangrene: Secondary | ICD-10-CM | POA: Diagnosis not present

## 2019-05-19 DIAGNOSIS — Z7401 Bed confinement status: Secondary | ICD-10-CM | POA: Diagnosis not present

## 2019-05-19 DIAGNOSIS — I313 Pericardial effusion (noninflammatory): Secondary | ICD-10-CM | POA: Diagnosis present

## 2019-05-19 DIAGNOSIS — I5032 Chronic diastolic (congestive) heart failure: Secondary | ICD-10-CM | POA: Diagnosis present

## 2019-05-19 DIAGNOSIS — R1314 Dysphagia, pharyngoesophageal phase: Secondary | ICD-10-CM | POA: Diagnosis present

## 2019-05-19 DIAGNOSIS — E8881 Metabolic syndrome: Secondary | ICD-10-CM | POA: Diagnosis present

## 2019-05-19 DIAGNOSIS — I509 Heart failure, unspecified: Secondary | ICD-10-CM

## 2019-05-19 DIAGNOSIS — E559 Vitamin D deficiency, unspecified: Secondary | ICD-10-CM | POA: Diagnosis not present

## 2019-05-19 DIAGNOSIS — R1310 Dysphagia, unspecified: Secondary | ICD-10-CM

## 2019-05-19 DIAGNOSIS — E870 Hyperosmolality and hypernatremia: Secondary | ICD-10-CM | POA: Diagnosis not present

## 2019-05-19 DIAGNOSIS — J9601 Acute respiratory failure with hypoxia: Secondary | ICD-10-CM | POA: Diagnosis not present

## 2019-05-19 DIAGNOSIS — R230 Cyanosis: Secondary | ICD-10-CM | POA: Diagnosis not present

## 2019-05-19 DIAGNOSIS — N1832 Chronic kidney disease, stage 3b: Secondary | ICD-10-CM | POA: Diagnosis not present

## 2019-05-19 DIAGNOSIS — E611 Iron deficiency: Secondary | ICD-10-CM | POA: Diagnosis present

## 2019-05-19 DIAGNOSIS — I340 Nonrheumatic mitral (valve) insufficiency: Secondary | ICD-10-CM | POA: Diagnosis not present

## 2019-05-19 DIAGNOSIS — R69 Illness, unspecified: Secondary | ICD-10-CM | POA: Diagnosis not present

## 2019-05-19 DIAGNOSIS — Z20822 Contact with and (suspected) exposure to covid-19: Secondary | ICD-10-CM | POA: Diagnosis present

## 2019-05-19 DIAGNOSIS — Z743 Need for continuous supervision: Secondary | ICD-10-CM | POA: Diagnosis not present

## 2019-05-19 DIAGNOSIS — I440 Atrioventricular block, first degree: Secondary | ICD-10-CM | POA: Diagnosis not present

## 2019-05-19 DIAGNOSIS — I5031 Acute diastolic (congestive) heart failure: Secondary | ICD-10-CM | POA: Diagnosis not present

## 2019-05-19 DIAGNOSIS — E039 Hypothyroidism, unspecified: Secondary | ICD-10-CM | POA: Diagnosis present

## 2019-05-19 DIAGNOSIS — I3139 Other pericardial effusion (noninflammatory): Secondary | ICD-10-CM

## 2019-05-19 DIAGNOSIS — Z841 Family history of disorders of kidney and ureter: Secondary | ICD-10-CM

## 2019-05-19 DIAGNOSIS — I110 Hypertensive heart disease with heart failure: Secondary | ICD-10-CM | POA: Diagnosis not present

## 2019-05-19 DIAGNOSIS — Z885 Allergy status to narcotic agent status: Secondary | ICD-10-CM

## 2019-05-19 DIAGNOSIS — R0602 Shortness of breath: Secondary | ICD-10-CM

## 2019-05-19 DIAGNOSIS — D696 Thrombocytopenia, unspecified: Secondary | ICD-10-CM | POA: Diagnosis present

## 2019-05-19 DIAGNOSIS — I890 Lymphedema, not elsewhere classified: Secondary | ICD-10-CM | POA: Diagnosis not present

## 2019-05-19 DIAGNOSIS — R609 Edema, unspecified: Secondary | ICD-10-CM

## 2019-05-19 DIAGNOSIS — Z8349 Family history of other endocrine, nutritional and metabolic diseases: Secondary | ICD-10-CM

## 2019-05-19 DIAGNOSIS — Z882 Allergy status to sulfonamides status: Secondary | ICD-10-CM

## 2019-05-19 DIAGNOSIS — K224 Dyskinesia of esophagus: Secondary | ICD-10-CM | POA: Diagnosis not present

## 2019-05-19 LAB — CBC
HCT: 39.9 % (ref 36.0–46.0)
Hemoglobin: 11.8 g/dL — ABNORMAL LOW (ref 12.0–15.0)
MCH: 30.7 pg (ref 26.0–34.0)
MCHC: 29.6 g/dL — ABNORMAL LOW (ref 30.0–36.0)
MCV: 103.9 fL — ABNORMAL HIGH (ref 80.0–100.0)
Platelets: 94 10*3/uL — ABNORMAL LOW (ref 150–400)
RBC: 3.84 MIL/uL — ABNORMAL LOW (ref 3.87–5.11)
RDW: 16.7 % — ABNORMAL HIGH (ref 11.5–15.5)
WBC: 4.1 10*3/uL (ref 4.0–10.5)
nRBC: 0 % (ref 0.0–0.2)

## 2019-05-19 LAB — FERRITIN: Ferritin: 40 ng/mL (ref 11–307)

## 2019-05-19 LAB — RESPIRATORY PANEL BY RT PCR (FLU A&B, COVID)
Influenza A by PCR: NEGATIVE
Influenza B by PCR: NEGATIVE
SARS Coronavirus 2 by RT PCR: NEGATIVE

## 2019-05-19 LAB — COMPREHENSIVE METABOLIC PANEL
ALT: 15 U/L (ref 0–44)
AST: 28 U/L (ref 15–41)
Albumin: 3.8 g/dL (ref 3.5–5.0)
Alkaline Phosphatase: 104 U/L (ref 38–126)
Anion gap: 9 (ref 5–15)
BUN: 33 mg/dL — ABNORMAL HIGH (ref 8–23)
CO2: 35 mmol/L — ABNORMAL HIGH (ref 22–32)
Calcium: 9.2 mg/dL (ref 8.9–10.3)
Chloride: 99 mmol/L (ref 98–111)
Creatinine, Ser: 1.49 mg/dL — ABNORMAL HIGH (ref 0.44–1.00)
GFR calc Af Amer: 37 mL/min — ABNORMAL LOW (ref >60)
GFR calc non Af Amer: 32 mL/min — ABNORMAL LOW (ref >60)
Glucose, Bld: 113 mg/dL — ABNORMAL HIGH (ref 70–99)
Potassium: 4.5 mmol/L (ref 3.5–5.1)
Sodium: 143 mmol/L (ref 135–145)
Total Bilirubin: 1.4 mg/dL — ABNORMAL HIGH (ref 0.3–1.2)
Total Protein: 7.1 g/dL (ref 6.5–8.1)

## 2019-05-19 LAB — VITAMIN B12: Vitamin B-12: 1097 pg/mL — ABNORMAL HIGH (ref 180–914)

## 2019-05-19 LAB — IRON AND TIBC
Iron: 47 ug/dL (ref 28–170)
Saturation Ratios: 11 % (ref 10.4–31.8)
TIBC: 412 ug/dL (ref 250–450)
UIBC: 365 ug/dL

## 2019-05-19 LAB — RETICULOCYTES
Immature Retic Fract: 12.3 % (ref 2.3–15.9)
RBC.: 3.8 MIL/uL — ABNORMAL LOW (ref 3.87–5.11)
Retic Count, Absolute: 62.3 10*3/uL (ref 19.0–186.0)
Retic Ct Pct: 1.6 % (ref 0.4–3.1)

## 2019-05-19 LAB — BRAIN NATRIURETIC PEPTIDE: B Natriuretic Peptide: 359 pg/mL — ABNORMAL HIGH (ref 0.0–100.0)

## 2019-05-19 LAB — TSH: TSH: 9.656 u[IU]/mL — ABNORMAL HIGH (ref 0.350–4.500)

## 2019-05-19 LAB — FOLATE: Folate: 14.2 ng/mL (ref >5.9)

## 2019-05-19 MED ORDER — ACETAMINOPHEN 325 MG PO TABS
650.0000 mg | ORAL_TABLET | Freq: Four times a day (QID) | ORAL | Status: DC | PRN
  Administered 2019-05-21 – 2019-06-02 (×4): 650 mg via ORAL
  Filled 2019-05-19 (×5): qty 2

## 2019-05-19 MED ORDER — SODIUM CHLORIDE 0.9% FLUSH
3.0000 mL | Freq: Two times a day (BID) | INTRAVENOUS | Status: DC
  Administered 2019-05-19 – 2019-06-02 (×26): 3 mL via INTRAVENOUS

## 2019-05-19 MED ORDER — ALBUTEROL SULFATE (2.5 MG/3ML) 0.083% IN NEBU
2.5000 mg | INHALATION_SOLUTION | Freq: Four times a day (QID) | RESPIRATORY_TRACT | Status: DC | PRN
  Administered 2019-05-24: 09:00:00 2.5 mg via RESPIRATORY_TRACT
  Filled 2019-05-19 (×2): qty 3

## 2019-05-19 MED ORDER — FUROSEMIDE 10 MG/ML IJ SOLN
40.0000 mg | Freq: Once | INTRAMUSCULAR | Status: AC
  Administered 2019-05-19: 14:00:00 40 mg via INTRAVENOUS
  Filled 2019-05-19: qty 4

## 2019-05-19 MED ORDER — PANTOPRAZOLE SODIUM 40 MG PO TBEC
40.0000 mg | DELAYED_RELEASE_TABLET | Freq: Every day | ORAL | Status: DC
  Administered 2019-05-19 – 2019-06-02 (×12): 40 mg via ORAL
  Filled 2019-05-19 (×14): qty 1

## 2019-05-19 MED ORDER — CALCIUM CARBONATE-VITAMIN D 500-200 MG-UNIT PO TABS
1.0000 | ORAL_TABLET | Freq: Every day | ORAL | Status: DC
  Administered 2019-05-20 – 2019-06-02 (×11): 1 via ORAL
  Filled 2019-05-19 (×14): qty 1

## 2019-05-19 MED ORDER — ONDANSETRON HCL 4 MG/2ML IJ SOLN: 4.0000 mg | Freq: Four times a day (QID) | INTRAMUSCULAR | Status: DC | PRN

## 2019-05-19 MED ORDER — ACETAMINOPHEN 650 MG RE SUPP: 650.0000 mg | Freq: Four times a day (QID) | RECTAL | Status: DC | PRN

## 2019-05-19 MED ORDER — APIXABAN 5 MG PO TABS
5.0000 mg | ORAL_TABLET | Freq: Two times a day (BID) | ORAL | Status: DC
  Administered 2019-05-19 – 2019-05-24 (×10): 5 mg via ORAL
  Filled 2019-05-19 (×13): qty 1

## 2019-05-19 MED ORDER — SODIUM CHLORIDE 0.9% FLUSH: 3.0000 mL | INTRAVENOUS | Status: DC | PRN

## 2019-05-19 MED ORDER — POTASSIUM CHLORIDE CRYS ER 10 MEQ PO TBCR
10.0000 meq | EXTENDED_RELEASE_TABLET | Freq: Every day | ORAL | Status: DC
  Administered 2019-05-20 – 2019-05-26 (×5): 10 meq via ORAL
  Filled 2019-05-19 (×9): qty 1

## 2019-05-19 MED ORDER — LEVOTHYROXINE SODIUM 137 MCG PO TABS
137.0000 ug | ORAL_TABLET | Freq: Every day | ORAL | Status: DC
  Administered 2019-05-20 – 2019-06-02 (×11): 137 ug via ORAL
  Filled 2019-05-19 (×13): qty 1

## 2019-05-19 MED ORDER — ADULT MULTIVITAMIN W/MINERALS CH
1.0000 | ORAL_TABLET | Freq: Every day | ORAL | Status: DC
  Administered 2019-05-20 – 2019-06-02 (×11): 1 via ORAL
  Filled 2019-05-19 (×13): qty 1

## 2019-05-19 MED ORDER — POLYSACCHARIDE IRON COMPLEX 150 MG PO CAPS
150.0000 mg | ORAL_CAPSULE | Freq: Every day | ORAL | Status: DC
  Administered 2019-05-19 – 2019-06-02 (×12): 150 mg via ORAL
  Filled 2019-05-19 (×14): qty 1

## 2019-05-19 MED ORDER — FUROSEMIDE 10 MG/ML IJ SOLN
40.0000 mg | Freq: Two times a day (BID) | INTRAMUSCULAR | Status: DC
  Administered 2019-05-19 – 2019-05-23 (×7): 40 mg via INTRAVENOUS
  Filled 2019-05-19 (×8): qty 4

## 2019-05-19 MED ORDER — VITAMIN D 25 MCG (1000 UNIT) PO TABS
2000.0000 [IU] | ORAL_TABLET | Freq: Every day | ORAL | Status: DC
  Administered 2019-05-20 – 2019-06-02 (×10): 2000 [IU] via ORAL
  Filled 2019-05-19 (×13): qty 2

## 2019-05-19 MED ORDER — SODIUM CHLORIDE 0.9 % IV SOLN: 250.0000 mL | INTRAVENOUS | Status: DC | PRN

## 2019-05-19 MED ORDER — ONDANSETRON HCL 4 MG PO TABS: 4.0000 mg | ORAL_TABLET | Freq: Four times a day (QID) | ORAL | Status: DC | PRN

## 2019-05-19 MED ORDER — LISINOPRIL 10 MG PO TABS
40.0000 mg | ORAL_TABLET | Freq: Every day | ORAL | Status: DC
  Filled 2019-05-19 (×2): qty 4

## 2019-05-19 NOTE — Progress Notes (Signed)
   Subjective:    Patient ID: [name], female    DOB: Jun 15, 1932, 86 y.o.   MRN: [id number]  Chief Complaint  Patient presents with  . Leg Swelling    started in september   . Groin Swelling    HPI Pt presents to the office today with bilateral leg swelling and SOB. She reports she has gained about 10 lbs over the last few months. She walked into office and her O2 was in the 60-80's. Her lips and fingers were blue. After resting and we placed her on 2 L of O2 her O2 increased to 94%.  She is followed by Cardiologists for A Fib. She states she has had SOB for years now, but over the last few months it has gradually worsen.    Review of Systems  Respiratory: Positive for shortness of breath.   Cardiovascular: Positive for leg swelling. Negative for palpitations.  All other systems reviewed and are negative.      Objective:   Physical Exam Vitals reviewed.  Constitutional:      General: She is not in acute distress.    Appearance: She is well-developed.  HENT:     Head: Normocephalic and atraumatic.  Eyes:     Pupils: Pupils are equal, round, and reactive to light.  Neck:     Thyroid: No thyromegaly.  Cardiovascular:     Rate and Rhythm: Normal rate. Rhythm irregular.     Heart sounds: Murmur present.  Pulmonary:     Effort: Pulmonary effort is normal. No respiratory distress.     Breath sounds: Normal breath sounds. No wheezing.  Abdominal:     General: Bowel sounds are normal. There is no distension.     Palpations: Abdomen is soft.     Tenderness: There is no abdominal tenderness.  Musculoskeletal:        General: No tenderness. Normal range of motion.     Cervical back: Normal range of motion and neck supple.  Skin:    General: Skin is warm and dry.  Neurological:     Mental Status: She is alert and oriented to person, place, and time.     Cranial Nerves: No cranial nerve deficit.     Deep Tendon Reflexes: Reflexes are normal and symmetric.  Psychiatric:         Behavior: Behavior normal.        Thought Content: Thought content normal.        Judgment: Judgment normal.        BP (!) 148/86   Pulse 82   Temp (!) 97.3 F (36.3 C) (Temporal)   Ht 5\' 4"  (1.626 m)   Wt 210 lb 12.8 oz (95.6 kg)   SpO2 (!) 77%   BMI 36.18 kg/m   Assessment & Plan:  [name] comes in today with chief complaint of Leg Swelling (started in september ) and Groin Swelling   Diagnosis and orders addressed:  1. Peripheral edema  2. SOB (shortness of breath)  Given the drop in her O2 and fluid gain, worrisome for new onset of CHF?  Heart irregular today- A fib?   October, FNP

## 2019-05-19 NOTE — ED Triage Notes (Signed)
Pt reports has lymphedema in legs.   Reports swelling has moved up her legs into her abd over the past month.  Reports while in the office pt's o2 sat decreased to 60's while walking in the office.  EMS placed pt on 3 liters and sast increased 98%  Denies any cough or fever.

## 2019-05-19 NOTE — H&P (Addendum)
History and Physical    [name] HYQ:[id number] DOB: [birth year] DOA: 05/19/2019  PCP: Raliegh Ip, DO   Patient coming from: PCP office  Chief Complaint: Worsening bilateral leg swelling and shortness of breath  HPI: [name] is a 86 y.o. female with medical history significant for atrial fibrillation on Eliquis, GERD, hypertension, hypothyroidism, lymphedema, chronic anemia, and CKD stage IIIb who presented to her PCP office with worsening bilateral lower extremity swelling and some shortness of breath with exertion that has been persisting over the last 1 month.  She also states that she has gained at least 10 pounds over the last few months and recalls her last weight being approximately 200 pounds.  She was able to walk into the office with her O2 saturations between 60th and 80th percentile on room air.  She was noted to be cyanotic with her lips and fingers.  She was placed on 2 L nasal cannula oxygen with increase in oxygen to 94%.  She was sent to the ED for further evaluation.  She denies any orthopnea or paroxysmal nocturnal dyspnea.  No chest pain, fevers, or chills noted.  She has had a mild nonproductive cough for the past few days.   ED Course: Stable vital signs were noted and she is afebrile.  Hemoglobin at 11.8 which appears to be chronically low.  Creatinine 1.49 and consistent with CKD stage IIIb.  Her platelet counts are low at 94,000.  Covid testing is negative.  BNP is elevated at 359.  EKG demonstrates atrial fibrillation that is rate controlled at 64 bpm.  1 view chest x-ray with bilateral pleural effusions right greater than left present.  She has been given 1 dose of IV Lasix in the ED and is starting to produce urine output.  She does not appear to be in any respiratory distress.  Review of Systems: As per HPI otherwise 10 point review of systems negative.   Past Medical History:  Diagnosis Date  . AF (atrial fibrillation) (HCC)   . Arthritis   .  Diaphragmatic hernia without mention of obstruction or gangrene   . Esophagitis   . First degree atrioventricular block   . Gastric polyp   . GERD (gastroesophageal reflux disease)   . Heme positive stool   . Hyperlipidemia   . Hypertension   . Hypothyroid   . Prolapse of vaginal walls without mention of uterine prolapse   . Rectal polyp     Past Surgical History:  Procedure Laterality Date  . DILATION AND CURETTAGE OF UTERUS    . KIDNEY STONE SURGERY       reports that she has never smoked. She has never used smokeless tobacco. She reports that she does not drink alcohol or use drugs.  Allergies  Allergen Reactions  . Aspirin Nausea Only  . Codeine Nausea Only  . Sulfa Antibiotics   . Vicodin [Hydrocodone-Acetaminophen] Nausea Only  . Warfarin Sodium Other (See Comments)    Tingling all over  . Cephalexin Rash    Red, rash covering lower limbs.  . Doxycycline Rash    Family History  Problem Relation Age of Onset  . Hypertension Mother   . Aneurysm Mother        Brain  . Hypertension Father   . Kidney disease Father   . Aneurysm Father        heart  . Arthritis Daughter   . Cancer Daughter        breast  . Heart  attack Daughter   . Hyperlipidemia Sister     Prior to Admission medications   Medication Sig Start Date End Date Taking? Authorizing Provider  albuterol (PROVENTIL HFA;VENTOLIN HFA) 108 (90 Base) MCG/ACT inhaler Inhale 2 puffs into the lungs every 6 (six) hours as needed for wheezing or shortness of breath. 04/11/18   Ernestina Penna, MD  calcium-vitamin D (OSCAL WITH D) 500-200 MG-UNIT per tablet Take 1 tablet by mouth daily.    [provider]  Cholecalciferol (VITAMIN D3) 2000 UNITS TABS Take 1 tablet by mouth daily.      [provider]  ELIQUIS 5 MG TABS tablet TAKE  (1)  TABLET TWICE A DAY. 05/14/19   Rollene Rotunda, MD  furosemide (LASIX) 40 MG tablet TAKE 1 TABLET DAILY. 04/24/19   Raliegh Ip, DO  gabapentin  (NEURONTIN) 100 MG capsule Needs to be seen for further refills. One by mouth twice daily. 05/14/19   Raliegh Ip, DO  iron polysaccharides (FERREX 150) 150 MG capsule Take 1 capsule (150 mg total) by mouth daily. 03/03/19   Raliegh Ip, DO  KLOR-CON 10 10 MEQ tablet TAKE 1 TABLET DAILY 05/14/19   Delynn Flavin M, DO  levothyroxine (SYNTHROID) 137 MCG tablet Take 1 tablet (137 mcg total) by mouth daily. as directed 11/10/18   Delynn Flavin M, DO  lisinopril (ZESTRIL) 40 MG tablet TAKE 1 TABLET DAILY 04/24/19   Delynn Flavin M, DO  LORazepam (ATIVAN) 0.5 MG tablet One half tablet to one daily if needed for anxiety 11/07/18   Delynn Flavin M, DO  Multiple Vitamins-Minerals (CENTRUM SILVER) tablet Take 1 tablet by mouth daily.     [provider]  mupirocin ointment (BACTROBAN) 2 % Place 1 application into the nose 2 (two) times daily. 12/12/18   Dettinger, Elige Radon, MD  omeprazole (PRILOSEC) 20 MG capsule TAKE 1 CAPSULE BY MOUTH TWICE DAILY BEFORE A MEAL (Needs to be seen before next refill) 04/24/19   Raliegh Ip, DO  polyethylene glycol powder (GLYCOLAX/MIRALAX) powder DISSOLVE 17 GRAMS (ONE CAPFUL) OF POWDER IN WATER & DRINK ONCE DAILY AS NEEDED 01/08/17   Ernestina Penna, MD    Physical Exam: Vitals:   05/19/19 1430 05/19/19 1500 05/19/19 1531 05/19/19 1553  BP: (!) 148/78 (!) 145/76 (!) 143/81   Pulse: 74 73 67   Resp: (!) 24 (!) 28 18   Temp:      TempSrc:      SpO2: 99% 100% 100%   Weight:    92.7 kg  Height:    5\' 4"  (1.626 m)    Constitutional: NAD, calm, comfortable, obese Vitals:   05/19/19 1430 05/19/19 1500 05/19/19 1531 05/19/19 1553  BP: (!) 148/78 (!) 145/76 (!) 143/81   Pulse: 74 73 67   Resp: (!) 24 (!) 28 18   Temp:      TempSrc:      SpO2: 99% 100% 100%   Weight:    92.7 kg  Height:    5\' 4"  (1.626 m)   Eyes: lids and conjunctivae normal ENMT: Mucous membranes are moist.  Neck: normal, supple Respiratory: clear to  auscultation bilaterally. Normal respiratory effort. No accessory muscle use.  On 2 L nasal cannula oxygen Cardiovascular: Regular rate and rhythm, no murmurs.  Bilateral lower extremity wrappings for lymphedema present Abdomen: no tenderness, minimal distention. Bowel sounds positive.  Musculoskeletal:  No joint deformity upper and lower extremities.   Skin: no rashes, lesions, ulcers.  Psychiatric: Normal  judgment and insight. Alert and oriented x 3. Normal mood.   Labs on Admission: I have personally reviewed following labs and imaging studies  CBC: Recent Labs  Lab 05/19/19 1200  WBC 4.1  HGB 11.8*  HCT 39.9  MCV 103.9*  PLT 94*   Basic Metabolic Panel: Recent Labs  Lab 05/19/19 1200  NA 143  K 4.5  CL 99  CO2 35*  GLUCOSE 113*  BUN 33*  CREATININE 1.49*  CALCIUM 9.2   GFR: Estimated Creatinine Clearance: 31 mL/min (A) (by C-G formula based on SCr of 1.49 mg/dL (H)). Liver Function Tests: Recent Labs  Lab 05/19/19 1200  AST 28  ALT 15  ALKPHOS 104  BILITOT 1.4*  PROT 7.1  ALBUMIN 3.8   No results for input(s): LIPASE, AMYLASE in the last 168 hours. No results for input(s): AMMONIA in the last 168 hours. Coagulation Profile: No results for input(s): INR, PROTIME in the last 168 hours. Cardiac Enzymes: No results for input(s): CKTOTAL, CKMB, CKMBINDEX, TROPONINI in the last 168 hours. BNP (last 3 results) No results for input(s): PROBNP in the last 8760 hours. HbA1C: No results for input(s): HGBA1C in the last 72 hours. CBG: No results for input(s): GLUCAP in the last 168 hours. Lipid Profile: No results for input(s): CHOL, HDL, LDLCALC, TRIG, CHOLHDL, LDLDIRECT in the last 72 hours. Thyroid Function Tests: No results for input(s): TSH, T4TOTAL, FREET4, T3FREE, THYROIDAB in the last 72 hours. Anemia Panel: No results for input(s): VITAMINB12, FOLATE, FERRITIN, TIBC, IRON, RETICCTPCT in the last 72 hours. Urine analysis:    Component Value Date/Time     APPEARANCEUR Clear 06/14/2015 0842   GLUCOSEU Negative 06/14/2015 0842   BILIRUBINUR Negative 06/14/2015 0842   PROTEINUR Negative 06/14/2015 0842   UROBILINOGEN negative 04/16/2015 0844   NITRITE Negative 06/14/2015 0842   LEUKOCYTESUR Negative 06/14/2015 0842    Radiological Exams on Admission: DG Chest Port 1 View  Result Date: 05/19/2019 CLINICAL DATA:  Decreased oxygen saturation today. Lower extremity swelling. EXAM: PORTABLE CHEST 1 VIEW COMPARISON:  PA and lateral chest 05/28/2017. FINDINGS: There is massive cardiomegaly. Small to moderate bilateral pleural effusions are seen, larger on the right. Pulmonary edema and basilar atelectasis are seen. No pneumothorax. No acute bony abnormality. Avascular necrosis of the humeral heads is noted. IMPRESSION: Cardiomegaly with interstitial pulmonary edema and small to moderate pleural effusions, larger on the right. Electronically Signed   By: Drusilla Kanner M.D.   On: 05/19/2019 12:25    EKG: Independently reviewed.  Atrial fibrillation 64 bpm.  Assessment/Plan Active Problems:   Acute hypoxemic respiratory failure (HCC)    Acute hypoxemic respiratory failure secondary to volume overload with bilateral pleural effusions -Continue on diuresis with IV Lasix 40 mg twice daily -She states that she has been compliant with her daily home Lasix 40 mg and watches her diet.  Therefore, I believe her Lasix dose will have to be increased upon discharge -Prior 2D echocardiogram 03/2015 with LVEF 65-70% with mild MR.  We will recheck during this visit -Strict I's and O's and daily weights -Fluid restriction -Wean oxygen as tolerated -Should not require thoracentesis unless not improving with diuresis  History of atrial fibrillation -Monitor on telemetry -Maintain on Eliquis for anticoagulation  Chronic thrombocytopenia -Monitor carefully while on anticoagulation with repeat CBC  CKD stage IIIb -Appears stable for now with baseline  creatinine approximately 1.3-1.4 -Monitor on repeat labs  Macrocytic anemia -Noted to be on iron supplementation with likely iron deficiency -Check anemia panel  Hypertension-stable -Monitor carefully with diuresis -Maintain on lisinopril for now  Hypothyroidism -Maintain on Synthroid  GERD -PPI  History of lymphedema -Has lower extremity wrappings present  DVT prophylaxis: Eliquis Code Status: DNR Family Communication: Patient has discussed with son, none at bedside Disposition Plan: Admit for diuresis Consults called: None Admission status: Inpatient, telemetry   [facility]  If 7PM-7AM, please contact night-coverage [email or web address]  05/19/2019, 4:18 PM

## 2019-05-19 NOTE — ED Provider Notes (Signed)
[facility]-EMERGENCY DEPT [facility] Emergency Department Provider Note MRN:  [id number]  Arrival date & time: 05/19/19     Chief Complaint   Shortness of Breath   History of Present Illness   [name] is a 86 y.o. year-old female with a history of A. fib, GERD presenting to the ED with chief complaint of shortness of breath.  Worsening shortness of breath for the past month.  Associated with lower extremity edema.  Denies chest pain, increased cough for the past few days.  Denies fever, no abdominal pain.  Had low oxygen numbers at the PCP office today, sent here.  Review of Systems  A complete 10 system review of systems was obtained and all systems are negative except as noted in the HPI and PMH.   Patient's Health History    Past Medical History:  Diagnosis Date  . AF (atrial fibrillation) (HCC)   . Arthritis   . Diaphragmatic hernia without mention of obstruction or gangrene   . Esophagitis   . First degree atrioventricular block   . Gastric polyp   . GERD (gastroesophageal reflux disease)   . Heme positive stool   . Hyperlipidemia   . Hypertension   . Hypothyroid   . Prolapse of vaginal walls without mention of uterine prolapse   . Rectal polyp     Past Surgical History:  Procedure Laterality Date  . DILATION AND CURETTAGE OF UTERUS    . KIDNEY STONE SURGERY      Family History  Problem Relation Age of Onset  . Hypertension Mother   . Aneurysm Mother        Brain  . Hypertension Father   . Kidney disease Father   . Aneurysm Father        heart  . Arthritis Daughter   . Cancer Daughter        breast  . Heart attack Daughter   . Hyperlipidemia Sister     Social History   Socioeconomic History  . Marital status: Divorced    Spouse name: Not on file  . Number of children: 2  . Years of education: 10th grade  . Highest education level: 10th grade  Occupational History  . Occupation: retired    Comment: [employer]  Tobacco Use  . Smoking status:  Never Smoker  . Smokeless tobacco: Never Used  Substance and Sexual Activity  . Alcohol use: No  . Drug use: No  . Sexual activity: Not Currently  Other Topics Concern  . Not on file  Social History Narrative  . Not on file   Social Determinants of Health   Financial Resource Strain:   . Difficulty of Paying Living Expenses: Not on file  Food Insecurity:   . Worried About Programme researcher, broadcasting/film/video in the Last Year: Not on file  . Ran Out of Food in the Last Year: Not on file  Transportation Needs:   . Lack of Transportation (Medical): Not on file  . Lack of Transportation (Non-Medical): Not on file  Physical Activity:   . Days of Exercise per Week: Not on file  . Minutes of Exercise per Session: Not on file  Stress:   . Feeling of Stress : Not on file  Social Connections:   . Frequency of Communication with Friends and Family: Not on file  . Frequency of Social Gatherings with Friends and Family: Not on file  . Attends Religious Services: Not on file  . Active Member of Clubs or Organizations: Not  on file  . Attends Banker Meetings: Not on file  . Marital Status: Not on file  Intimate Partner Violence:   . Fear of Current or Ex-Partner: Not on file  . Emotionally Abused: Not on file  . Physically Abused: Not on file  . Sexually Abused: Not on file     Physical Exam   Vitals:   05/19/19 1330 05/19/19 1430  BP: (!) 144/78 (!) 148/78  Pulse: 70 74  Resp: (!) 21 (!) 24  Temp:    SpO2: 100% 99%    CONSTITUTIONAL: Well-appearing, NAD NEURO:  Alert and oriented x 3, no focal deficits EYES:  eyes equal and reactive ENT/NECK:  no LAD, no JVD CARDIO: Regular rate, well-perfused, normal S1 and S2 PULM:  CTAB no wheezing or rhonchi, mildly tachypneic GI/GU:  normal bowel sounds, non-distended, non-tender MSK/SPINE:  No gross deformities, 2+ pitting edema to the bilateral lower extremities SKIN:  no rash, atraumatic PSYCH:  Appropriate speech and  behavior  *Additional and/or pertinent findings included in MDM below  Diagnostic and Interventional Summary    EKG Interpretation  Date/Time:  Tuesday May 19 2019 11:36:54 EST Ventricular Rate:  64 PR Interval:    QRS Duration: 145 QT Interval:  480 QTC Calculation: 503 R Axis:   -167 Text Interpretation: Right and left arm electrode reversal, interpretation assumes no reversal Atrial fibrillation Ventricular premature complex Nonspecific intraventricular conduction delay Anterior infarct, old Baseline wander in lead(s) II III aVF V3 Confirmed by Kennis Carina [phone]) on 05/19/2019 11:52:14 AM      Cardiac Monitoring Interpretation:  Labs Reviewed  BRAIN NATRIURETIC PEPTIDE - Abnormal; Notable for the following components:      Result Value   B Natriuretic Peptide 359.0 (*)    All other components within normal limits  CBC - Abnormal; Notable for the following components:   RBC 3.84 (*)    Hemoglobin 11.8 (*)    MCV 103.9 (*)    MCHC 29.6 (*)    RDW 16.7 (*)    Platelets 94 (*)    All other components within normal limits  COMPREHENSIVE METABOLIC PANEL - Abnormal; Notable for the following components:   CO2 35 (*)    Glucose, Bld 113 (*)    BUN 33 (*)    Creatinine, Ser 1.49 (*)    Total Bilirubin 1.4 (*)    GFR calc non Af Amer 32 (*)    GFR calc Af Amer 37 (*)    All other components within normal limits  RESPIRATORY PANEL BY RT PCR (FLU A&B, COVID)    DG Chest Port 1 View  Final Result      Medications  furosemide (LASIX) injection 40 mg (40 mg Intravenous Given 05/19/19 1346)     Procedures  /  Critical Care .Critical Care Performed by: Sabas Sous, MD Authorized by: Sabas Sous, MD   Critical care provider statement:    Critical care time (minutes):  45   Critical care was necessary to treat or prevent imminent or life-threatening deterioration of the following conditions:  Respiratory failure   Critical care was time spent personally by  me on the following activities:  Discussions with consultants, evaluation of patient's response to treatment, examination of patient, ordering and performing treatments and interventions, ordering and review of laboratory studies, ordering and review of radiographic studies, pulse oximetry, re-evaluation of patient's condition, obtaining history from patient or surrogate and review of old charts    ED Course and  Medical Decision Making  I have reviewed the triage vital signs, the nursing notes, and pertinent available records from the EMR.  Pertinent labs & imaging results that were available during my care of the patient were reviewed by me and considered in my medical decision making (see below for details).     Hypoxic respiratory failure, considering CHF exacerbation which would be new onset versus COVID-19, patient is on Eliquis and her edema is symmetric, doubt VTE.  Given new oxygen requirement, patient will need admission.  Admitted to hospital service for further care.  Barth Kirks. Sedonia Small, MD [place] [email or web address]  Final Clinical Impressions(s) / ED Diagnoses     ICD-10-CM   1. Acute congestive heart failure, unspecified heart failure type ([place])  I50.9     ED Discharge Orders    None       Discharge Instructions Discussed with and Provided to Patient:   Discharge Instructions   None       Maudie Flakes, MD 05/19/19 [phone]

## 2019-05-20 ENCOUNTER — Inpatient Hospital Stay (HOSPITAL_COMMUNITY): Payer: Medicare Other

## 2019-05-20 DIAGNOSIS — I340 Nonrheumatic mitral (valve) insufficiency: Secondary | ICD-10-CM

## 2019-05-20 DIAGNOSIS — I350 Nonrheumatic aortic (valve) stenosis: Secondary | ICD-10-CM

## 2019-05-20 DIAGNOSIS — E8770 Fluid overload, unspecified: Secondary | ICD-10-CM | POA: Diagnosis present

## 2019-05-20 DIAGNOSIS — I351 Nonrheumatic aortic (valve) insufficiency: Secondary | ICD-10-CM

## 2019-05-20 DIAGNOSIS — E034 Atrophy of thyroid (acquired): Secondary | ICD-10-CM

## 2019-05-20 DIAGNOSIS — I878 Other specified disorders of veins: Secondary | ICD-10-CM

## 2019-05-20 DIAGNOSIS — I509 Heart failure, unspecified: Secondary | ICD-10-CM

## 2019-05-20 LAB — CBC
HCT: 38.3 % (ref 36.0–46.0)
Hemoglobin: 11.5 g/dL — ABNORMAL LOW (ref 12.0–15.0)
MCH: 31.7 pg (ref 26.0–34.0)
MCHC: 30 g/dL (ref 30.0–36.0)
MCV: 105.5 fL — ABNORMAL HIGH (ref 80.0–100.0)
Platelets: 87 10*3/uL — ABNORMAL LOW (ref 150–400)
RBC: 3.63 MIL/uL — ABNORMAL LOW (ref 3.87–5.11)
RDW: 16.5 % — ABNORMAL HIGH (ref 11.5–15.5)
WBC: 3.6 10*3/uL — ABNORMAL LOW (ref 4.0–10.5)
nRBC: 0 % (ref 0.0–0.2)

## 2019-05-20 LAB — BASIC METABOLIC PANEL
Anion gap: 11 (ref 5–15)
BUN: 31 mg/dL — ABNORMAL HIGH (ref 8–23)
CO2: 35 mmol/L — ABNORMAL HIGH (ref 22–32)
Calcium: 9 mg/dL (ref 8.9–10.3)
Chloride: 99 mmol/L (ref 98–111)
Creatinine, Ser: 1.38 mg/dL — ABNORMAL HIGH (ref 0.44–1.00)
GFR calc Af Amer: 41 mL/min — ABNORMAL LOW (ref >60)
GFR calc non Af Amer: 35 mL/min — ABNORMAL LOW (ref >60)
Glucose, Bld: 80 mg/dL (ref 70–99)
Potassium: 4.4 mmol/L (ref 3.5–5.1)
Sodium: 145 mmol/L (ref 135–145)

## 2019-05-20 LAB — MAGNESIUM: Magnesium: 2.2 mg/dL (ref 1.7–2.4)

## 2019-05-20 LAB — ECHOCARDIOGRAM COMPLETE
Height: 64 in
Weight: 3340.41 oz

## 2019-05-20 NOTE — Plan of Care (Signed)

## 2019-05-20 NOTE — Progress Notes (Signed)
*  PRELIMINARY RESULTS* Echocardiogram 2D Echocardiogram has been performed.  Jeryl Columbia 05/20/2019, 2:05 PM

## 2019-05-20 NOTE — Progress Notes (Addendum)
PROGRESS NOTE [facility]   [name]  KDT:[id number]  DOB: [birth year]  DOA: 05/19/2019 PCP: Raliegh Ip, DO  Brief Admission Hx: 86 year old female with A. fib on apixaban, GERD, hypertension, hypothyroidism, chronic lymphedema and chronic anemia, stage IIIb CKD presented with increasing lower extremity edema and shortness of breath with exertion progressive over the past 4 to 6 weeks.  Patient also recalled a 10 pound weight gain.  Patient was sent from PCP office with concerns for new findings of CHF.  MDM/Assessment & Plan:   1. Heart failure, preserved EF-patient responding well to IV diuresis with Lasix.  She is clinically starting to show some improvement.  Continue IV Lasix for diuresis.  Continue to monitor intake and output, weight and electrolytes.  She remains volume overloaded and will require further IV diuresis.  Elevate extremities.  TED hose if tolerated. 2. Acute hypoxic respiratory failure secondary to volume overload-improving with IV diuresis continue as noted above.  Follow 2D echocardiogram.  Wean oxygen as tolerated. 3. Permanent atrial fibrillation-rate is well controlled.  Continue apixaban for full anticoagulation. 4. Thrombocytopenia-following closely as patient is fully anticoagulated with apixaban.  CBC in a.m. 5. Large pericardial effusion - noted on echo, no tamponade, spoke with cardiologist, would repeat limited Echo on 05/23/19 to reassess.   6. Stage 3b CKD-slight improvement with IV diuresis, repeat in a.m. 7. Essential hypertension-patient's blood pressures are well controlled on current home lisinopril which we are following. 8. Hypothyroidism-stable on levothyroxine. 9. GERD-Protonix ordered for GI protection. 10. Chronic lymphedema-diuresis as noted above, lower extremity compression stockings as tolerated.  DVT prophylaxis: Apixaban Code Status: Full Family Communication: Patient updated at bedside, verbalized  understanding Disposition Plan: From home, continues to need IV diuresis with Lasix for symptom relief, follow-up 2D echocardiogram and repeat electrolytes in a.m.  Consultants:    Procedures:    Antimicrobials:     Subjective: Awake, alert, legs continue to feel heavy and more swollen than normal.  Shortness of breath slightly improved.  Objective: Vitals:   05/19/19 2132 05/20/19 0500 05/20/19 0630 05/20/19 1324  BP: 107/67  104/60 (!) 100/52  Pulse: 92  65 73  Resp: 18  17 18   Temp: 98.3 F (36.8 C)  98.1 F (36.7 C) 98 F (36.7 C)  TempSrc: Oral  Oral Oral  SpO2: 99%  99% 98%  Weight:  94.7 kg    Height:        Intake/Output Summary (Last 24 hours) at 05/20/2019 1634 Last data filed at 05/20/2019 1300 Gross per 24 hour  Intake 730 ml  Output 700 ml  Net 30 ml   Filed Weights   05/19/19 1131 05/19/19 1553 05/20/19 0500  Weight: 95 kg 92.7 kg 94.7 kg   REVIEW OF SYSTEMS  As per history otherwise all reviewed and reported negative  Exam:  General exam: Awake, alert, no apparent distress, cooperative.  Elderly female. Respiratory system: Bibasilar crackles.  No increased work of breathing. Cardiovascular system: Normal S1 & S2 heard. No JVD, murmurs, gallops, clicks.  1+ pitting pedal edema. Gastrointestinal system: Abdomen is nondistended, soft and nontender. Normal bowel sounds heard. Central nervous system: Alert and oriented. No focal neurological deficits. Extremities: 2+ pitting edema bilateral lower extremities.  Data Reviewed: Basic Metabolic Panel: Recent Labs  Lab 05/19/19 1200 05/20/19 0433  NA 143 145  K 4.5 4.4  CL 99 99  CO2 35* 35*  GLUCOSE 113* 80  BUN 33* 31*  CREATININE 1.49* 1.38*  CALCIUM 9.2  9.0  MG  --  2.2   Liver Function Tests: Recent Labs  Lab 05/19/19 1200  AST 28  ALT 15  ALKPHOS 104  BILITOT 1.4*  PROT 7.1  ALBUMIN 3.8   No results for input(s): LIPASE, AMYLASE in the last 168 hours. No results for  input(s): AMMONIA in the last 168 hours. CBC: Recent Labs  Lab 05/19/19 1200 05/20/19 0433  WBC 4.1 3.6*  HGB 11.8* 11.5*  HCT 39.9 38.3  MCV 103.9* 105.5*  PLT 94* 87*   Cardiac Enzymes: No results for input(s): CKTOTAL, CKMB, CKMBINDEX, TROPONINI in the last 168 hours. CBG (last 3)  No results for input(s): GLUCAP in the last 72 hours. Recent Results (from the past 240 hour(s))  Respiratory Panel by RT PCR (Flu A&B, Covid) - Nasopharyngeal Swab     Status: None   Collection Time: 05/19/19 12:27 PM   Specimen: Nasopharyngeal Swab  Result Value Ref Range Status   SARS Coronavirus 2 by RT PCR NEGATIVE NEGATIVE Final    Comment: (NOTE) SARS-CoV-2 target nucleic acids are NOT DETECTED. The SARS-CoV-2 RNA is generally detectable in upper respiratoy specimens during the acute phase of infection. The lowest concentration of SARS-CoV-2 viral copies this assay can detect is 131 copies/mL. A negative result does not preclude SARS-Cov-2 infection and should not be used as the sole basis for treatment or other patient management decisions. A negative result may occur with  improper specimen collection/handling, submission of specimen other than nasopharyngeal swab, presence of viral mutation(s) within the areas targeted by this assay, and inadequate number of viral copies (<131 copies/mL). A negative result must be combined with clinical observations, patient history, and epidemiological information. The expected result is Negative. Fact Sheet for Patients:  https://www.moore.com/ Fact Sheet for Healthcare Providers:  https://www.young.biz/ This test is not yet ap proved or cleared by the [place] FDA and  has been authorized for detection and/or diagnosis of SARS-CoV-2 by FDA under an Emergency Use Authorization (EUA). This EUA will remain  in effect (meaning this test can be used) for the duration of the COVID-19 declaration under Section  564(b)(1) of the Act, 21 U.S.C. section 360bbb-3(b)(1), unless the authorization is terminated or revoked sooner.    Influenza A by PCR NEGATIVE NEGATIVE Final   Influenza B by PCR NEGATIVE NEGATIVE Final    Comment: (NOTE) The Xpert Xpress SARS-CoV-2/FLU/RSV assay is intended as an aid in  the diagnosis of influenza from Nasopharyngeal swab specimens and  should not be used as a sole basis for treatment. Nasal washings and  aspirates are unacceptable for Xpert Xpress SARS-CoV-2/FLU/RSV  testing. Fact Sheet for Patients: https://www.moore.com/ Fact Sheet for Healthcare Providers: https://www.young.biz/ This test is not yet approved or cleared by the [place] FDA and  has been authorized for detection and/or diagnosis of SARS-CoV-2 by  FDA under an Emergency Use Authorization (EUA). This EUA will remain  in effect (meaning this test can be used) for the duration of the  Covid-19 declaration under Section 564(b)(1) of the Act, 21  U.S.C. section 360bbb-3(b)(1), unless the authorization is  terminated or revoked. Performed at [facility], [address], [place], [place] [address]      Studies: [facility] Chest [facility] 1 View  Result Date: 05/19/2019 CLINICAL DATA:  Decreased oxygen saturation today. Lower extremity swelling. EXAM: PORTABLE CHEST 1 VIEW COMPARISON:  PA and lateral chest 05/28/2017. FINDINGS: There is massive cardiomegaly. Small to moderate bilateral pleural effusions are seen, larger on the right. Pulmonary edema  and basilar atelectasis are seen. No pneumothorax. No acute bony abnormality. Avascular necrosis of the humeral heads is noted. IMPRESSION: Cardiomegaly with interstitial pulmonary edema and small to moderate pleural effusions, larger on the right. Electronically Signed   By: Inge Rise M.D.   On: 05/19/2019 12:25   ECHOCARDIOGRAM COMPLETE  Result Date: 05/20/2019    ECHOCARDIOGRAM REPORT   Patient Name:   [name]  Date of Exam: 05/20/2019 Medical Rec #:  [id number]      Height:       64.0 in Accession #:    [id number]     Weight:       208.8 lb Date of Birth:  1932-11-18      BSA:          1.99 m Patient Age:    56 years       BP:           100/52 mmHg Patient Gender: F              HR:           73 bpm. Exam Location:  [facility] Procedure: 2D Echo Indications:    CHF-Acute Diastolic 829.56 / O13.08  History:        Patient has prior history of Echocardiogram examinations, most                 recent 03/16/2015. Arrythmias:Atrial Fibrillation; Risk                 Factors:Dyslipidemia, Hypertension and Non-Smoker. Acute                 hypoxemic respiratory failure , First degree atrioventricular                 block, GERD, Non-rheumatic mitral regurgitation.  Sonographer:    Leavy Cella RDCS (AE) Referring Phys: [phone] Royanne Foots [place]  1. Left ventricular ejection fraction, by estimation, is 60 to 65%. The left ventricle has normal function. The left ventricle has no regional wall motion abnormalities. There is mild concentric left ventricular hypertrophy. Left ventricular diastolic parameters are indeterminate. Elevated left ventricular end-diastolic pressure.  2. Right ventricular systolic function is normal. The right ventricular size is mildly enlarged. There is severely elevated pulmonary artery systolic pressure.  3. Left atrial size was severely dilated.  4. Right atrial size was severely dilated.  5. IVC collapses. No RA or RV collapse. No signs of tamponade. Large pericardial effusion. The pericardial effusion is circumferential.  6. The mitral valve is grossly normal. Mild mitral valve regurgitation.  7. Tricuspid valve regurgitation is moderate.  8. The aortic valve is tricuspid. Aortic valve regurgitation is mild. Mild aortic valve stenosis. Aortic valve area, by VTI measures 1.57 cm. Aortic valve mean gradient measures 17.0 mmHg. Aortic valve Vmax measures 2.79 m/s.  9. The inferior vena cava is  dilated in size with >50% respiratory variability, suggesting right atrial pressure of 8 mmHg. FINDINGS  Left Ventricle: Left ventricular ejection fraction, by estimation, is 60 to 65%. The left ventricle has normal function. The left ventricle has no regional wall motion abnormalities. The left ventricular internal cavity size was normal in size. There is  mild concentric left ventricular hypertrophy. Left ventricular diastolic parameters are indeterminate. Elevated left ventricular end-diastolic pressure. Right Ventricle: The right ventricular size is mildly enlarged. No increase in right ventricular wall thickness. Right ventricular systolic function is normal. There is severely elevated pulmonary artery systolic pressure. The tricuspid  regurgitant velocity is 4.02 m/s, and with an assumed right atrial pressure of 10 mmHg, the estimated right ventricular systolic pressure is 74.6 mmHg. Left Atrium: Left atrial size was severely dilated. Right Atrium: Right atrial size was severely dilated. Pericardium: IVC collapses. No RA or RV collapse. No signs of tamponade. A large pericardial effusion is present. The pericardial effusion is circumferential. Mitral Valve: The mitral valve is grossly normal. Mild mitral valve regurgitation. Tricuspid Valve: The tricuspid valve is grossly normal. Tricuspid valve regurgitation is moderate. Aortic Valve: The aortic valve is tricuspid. Aortic valve regurgitation is mild. Aortic regurgitation PHT measures 540 msec. Mild aortic stenosis is present. Moderate aortic valve annular calcification. There is mild calcification of the aortic valve. Aortic valve mean gradient measures 17.0 mmHg. Aortic valve peak gradient measures 31.1 mmHg. Aortic valve area, by VTI measures 1.57 cm. Pulmonic Valve: The pulmonic valve was grossly normal. Pulmonic valve regurgitation is not visualized. Aorta: The aortic root is normal in size and structure. Venous: The inferior vena cava is dilated in size  with greater than 50% respiratory variability, suggesting right atrial pressure of 8 mmHg. IAS/Shunts: No atrial level shunt detected by color flow Doppler.  LEFT VENTRICLE PLAX 2D LVIDd:         5.12 cm  Diastology LVIDs:         3.07 cm  LV e' lateral:   8.81 cm/s LV PW:         1.34 cm  LV E/e' lateral: 15.3 LV IVS:        1.15 cm  LV e' medial:    6.53 cm/s LVOT diam:     1.80 cm  LV E/e' medial:  20.7 LV SV:         91.86 ml LV SV Index:   41.66 LVOT Area:     2.54 cm  LEFT ATRIUM              Index       RIGHT ATRIUM           Index LA diam:        4.90 cm  2.46 cm/m  RA Area:     38.50 cm LA Vol (A2C):   186.0 ml 93.35 ml/m RA Volume:   166.00 ml 83.32 ml/m LA Vol (A4C):   160.0 ml 80.30 ml/m LA Biplane Vol: 173.0 ml 86.83 ml/m  AORTIC VALVE AV Area (Vmax):    1.62 cm AV Area (Vmean):   1.49 cm AV Area (VTI):     1.57 cm AV Vmax:           278.67 cm/s AV Vmean:          192.333 cm/s AV VTI:            0.584 m AV Peak Grad:      31.1 mmHg AV Mean Grad:      17.0 mmHg LVOT Vmax:         177.33 cm/s LVOT Vmean:        112.667 cm/s LVOT VTI:          0.361 m LVOT/AV VTI ratio: 0.62 AI PHT:            540 msec  AORTA Ao Root diam: 2.80 cm MITRAL VALVE                TRICUSPID VALVE MV Area (PHT): 2.42 cm     TR Peak grad:   64.6 mmHg MV Decel Time: 313 msec  TR Vmax:        402.00 cm/s MR Peak grad: 63.0 mmHg MR Mean grad: 45.0 mmHg     SHUNTS MR Vmax:      397.00 cm/s   Systemic VTI:  0.36 m MR Vmean:     320.0 cm/s    Systemic Diam: 1.80 cm MV E velocity: 135.00 cm/s MV A velocity: 39.40 cm/s MV E/A ratio:  3.43 Prentice Docker MD Electronically signed by Prentice Docker MD Signature Date/Time: 05/20/2019/2:55:41 PM    Final    Scheduled Meds: . apixaban  5 mg Oral BID  . calcium-vitamin D  1 tablet Oral Q breakfast  . cholecalciferol  2,000 Units Oral Daily  . furosemide  40 mg Intravenous Q12H  . iron polysaccharides  150 mg Oral Daily  . levothyroxine  137 mcg Oral Q0600  .  multivitamin with minerals  1 tablet Oral Daily  . pantoprazole  40 mg Oral Daily  . potassium chloride  10 mEq Oral Daily  . sodium chloride flush  3 mL Intravenous Q12H   Continuous Infusions: . sodium chloride      Principal Problem:   Acute hypoxemic respiratory failure (HCC) Active Problems:   Hypothyroidism   GERD (gastroesophageal reflux disease)   Hyperlipidemia   AF (atrial fibrillation) (HCC)   Vitamin D deficiency   Metabolic syndrome   Venous stasis of both lower extremities   Acute congestive heart failure (HCC)   Volume overload  Time spent:   Standley Dakins, MD [facility] 05/20/2019, 4:34 PM    LOS: 1 day  How to contact the [facility] Attending or Consulting provider 7A - 7P or covering provider during after hours 7P -7A, for this patient?  1. Check the care team in [facility] and look for a) attending/consulting [facility] provider listed and b) the [facility] team listed 2. Log into [email or web address] and use [place]'s universal password to access. If you do not have the password, please contact the hospital operator. 3. Locate the [facility] provider you are looking for under [facility] and page to a number that you can be directly reached. 4. If you still have difficulty reaching the provider, please page the [facility] (Director on Call) for the Hospitalists listed on amion for assistance.

## 2019-05-21 LAB — BASIC METABOLIC PANEL WITH GFR
Anion gap: 10 (ref 5–15)
BUN: 31 mg/dL — ABNORMAL HIGH (ref 8–23)
CO2: 37 mmol/L — ABNORMAL HIGH (ref 22–32)
Calcium: 9 mg/dL (ref 8.9–10.3)
Chloride: 96 mmol/L — ABNORMAL LOW (ref 98–111)
Creatinine, Ser: 1.35 mg/dL — ABNORMAL HIGH (ref 0.44–1.00)
GFR calc Af Amer: 42 mL/min — ABNORMAL LOW
GFR calc non Af Amer: 36 mL/min — ABNORMAL LOW
Glucose, Bld: 92 mg/dL (ref 70–99)
Potassium: 4.2 mmol/L (ref 3.5–5.1)
Sodium: 143 mmol/L (ref 135–145)

## 2019-05-21 LAB — CBC
HCT: 38.3 % (ref 36.0–46.0)
Hemoglobin: 11.1 g/dL — ABNORMAL LOW (ref 12.0–15.0)
MCH: 31 pg (ref 26.0–34.0)
MCHC: 29 g/dL — ABNORMAL LOW (ref 30.0–36.0)
MCV: 107 fL — ABNORMAL HIGH (ref 80.0–100.0)
Platelets: 90 10*3/uL — ABNORMAL LOW (ref 150–400)
RBC: 3.58 MIL/uL — ABNORMAL LOW (ref 3.87–5.11)
RDW: 16 % — ABNORMAL HIGH (ref 11.5–15.5)
WBC: 4.3 10*3/uL (ref 4.0–10.5)
nRBC: 0 % (ref 0.0–0.2)

## 2019-05-21 LAB — MAGNESIUM: Magnesium: 2.3 mg/dL (ref 1.7–2.4)

## 2019-05-21 MED ORDER — HALOPERIDOL LACTATE 5 MG/ML IJ SOLN
2.5000 mg | Freq: Once | INTRAMUSCULAR | Status: AC
  Administered 2019-05-21: 22:00:00 2.5 mg via INTRAVENOUS
  Filled 2019-05-21: qty 1

## 2019-05-21 NOTE — Plan of Care (Signed)
  Problem: Acute Rehab PT Goals(only PT should resolve) Goal: Pt Will Go Supine/Side To Sit Outcome: Progressing Flowsheets (Taken 05/21/2019 1153) Pt will go Supine/Side to Sit: with supervision Goal: Pt Will Go Sit To Supine/Side Outcome: Progressing Flowsheets (Taken 05/21/2019 1153) Pt will go Sit to Supine/Side: with supervision Goal: Patient Will Transfer Sit To/From Stand Outcome: Progressing Flowsheets (Taken 05/21/2019 1153) Patient will transfer sit to/from stand: with min guard assist Goal: Pt Will Transfer Bed To Chair/Chair To Bed Outcome: Progressing Flowsheets (Taken 05/21/2019 1153) Pt will Transfer Bed to Chair/Chair to Bed: min guard assist Goal: Pt Will Ambulate Outcome: Progressing Flowsheets (Taken 05/21/2019 1153) Pt will Ambulate:  50 feet  with min guard assist  with least restrictive assistive device  11:55 AM, 05/21/19 Wyman Songster PT, DPT Physical Therapist at [facility]

## 2019-05-21 NOTE — NC FL2 (Signed)
[place] LEVEL OF CARE SCREENING TOOL     IDENTIFICATION  Patient Name: [name] Birthdate: 11-27-1932 Sex: female Admission Date (Current Location): 05/19/2019  [facility] [place] Number:  [employer] and Address:  [place] [address] [place]      Provider Number: [id number]  Attending Physician Name and Address:  Murlean Iba, MD  Relative Name and Phone Number:  [name] (son) [facility]: [phone]    Current Level of Care: Hospital Recommended Level of Care: [place] Prior Approval Number:    Date Approved/Denied:   PASRR Number:    Discharge Plan: SNF    Current Diagnoses: Patient Active Problem List   Diagnosis Date Noted  . Acute congestive heart failure ([place]) 05/20/2019  . Volume overload 05/20/2019  . Acute hypoxemic respiratory failure ([place]) 05/19/2019  . Venous stasis of both lower extremities 01/13/2019  . Generalized anxiety disorder 11/07/2018  . Non-rheumatic mitral regurgitation 03/20/2017  . Thrombocytopenia ([place]) 09/14/2016  . Hemoglobin decreased 09/14/2016  . Hearing loss 01/06/2015  . Major depressive disorder, recurrent episode, moderate ([place]) 06/07/2014  . Thoracic aorta atherosclerosis ([place]) 03/04/2014  . Degenerative arthritis of thoracic spine 03/04/2014  . Osteoporosis, post-menopausal 08/12/2013  . Metabolic syndrome 03/47/4259  . Anemia, iron deficiency 03/16/2013  . Vitamin D deficiency 03/16/2013  . HTN (hypertension) 03/16/2013  . Hypothyroidism   . Diaphragmatic hernia without mention of obstruction or gangrene   . GERD (gastroesophageal reflux disease)   . Hyperlipidemia   . Prolapse of vaginal walls without mention of uterine prolapse   . Arthritis   . First degree atrioventricular block   . Symptomatic menopausal or female climacteric states   . AF (atrial fibrillation) ([place])   . Rectal polyp   . Gastric polyp     Orientation  RESPIRATION BLADDER Height & Weight     Self, Time, Situation, Place  O2(2L) Continent Weight: 208 lb 12.4 oz (94.7 kg) Height:  5\' 4"  (162.6 cm)  BEHAVIORAL SYMPTOMS/MOOD NEUROLOGICAL BOWEL NUTRITION STATUS      Continent Diet(Heart healthy diet)  AMBULATORY STATUS COMMUNICATION OF NEEDS Skin   Limited Assist Verbally Other (Comment)(Lymphedema)                       Personal Care Assistance Level of Assistance  Bathing, Dressing Bathing Assistance: Limited assistance   Dressing Assistance: Limited assistance     Functional Limitations Info  Hearing, Sight Sight Info: Impaired Hearing Info: Impaired(Has hearing aides, but continues to have difficulty hearing)      SPECIAL CARE FACTORS FREQUENCY                       Contractures      Additional Factors Info  Allergies   Allergies Info: Aspirin; Codeine; Sulfa Antibiotics; Vicodin (Hydrocodone-acetaminophen); Warfarin Sodium; Cephalexin; Doxycycline           Current Medications (05/21/2019):  This is the current hospital active medication list Current Facility-Administered Medications  Medication Dose Route Frequency Provider Last Rate Last Admin  . 0.9 %  sodium chloride infusion  250 mL Intravenous PRN Manuella Ghazi, Pratik D, DO      . acetaminophen (TYLENOL) tablet 650 mg  650 mg Oral Q6H PRN Heath Lark D, DO   650 mg at 05/21/19 5638   Or  . acetaminophen (TYLENOL) suppository 650 mg  650 mg Rectal Q6H PRN Manuella Ghazi, Pratik D, DO      .  albuterol (PROVENTIL) (2.5 MG/3ML) 0.083% nebulizer solution 2.5 mg  2.5 mg Inhalation Q6H PRN Sherryll Burger, Pratik D, DO      . apixaban (ELIQUIS) tablet 5 mg  5 mg Oral BID Sherryll Burger, Pratik D, DO   5 mg at 05/21/19 0946  . calcium-vitamin D (OSCAL WITH D) 500-200 MG-UNIT per tablet 1 tablet  1 tablet Oral Q breakfast Maurilio Lovely D, DO   1 tablet at 05/21/19 0946  . cholecalciferol (VITAMIN D3) tablet 2,000 Units  2,000 Units Oral Daily Maurilio Lovely D, DO   2,000 Units at 05/21/19 0946  .  furosemide (LASIX) injection 40 mg  40 mg Intravenous Q12H Shah, Pratik D, DO   40 mg at 05/21/19 0546  . iron polysaccharides (NIFEREX) capsule 150 mg  150 mg Oral Daily Sherryll Burger, Pratik D, DO   150 mg at 05/21/19 0946  . levothyroxine (SYNTHROID) tablet 137 mcg  137 mcg Oral Q0600 Maurilio Lovely D, DO   137 mcg at 05/21/19 0546  . multivitamin with minerals tablet 1 tablet  1 tablet Oral Daily Maurilio Lovely D, DO   1 tablet at 05/21/19 0946  . ondansetron (ZOFRAN) tablet 4 mg  4 mg Oral Q6H PRN Sherryll Burger, Pratik D, DO       Or  . ondansetron (ZOFRAN) injection 4 mg  4 mg Intravenous Q6H PRN Sherryll Burger, Pratik D, DO      . pantoprazole (PROTONIX) EC tablet 40 mg  40 mg Oral Daily Sherryll Burger, Pratik D, DO   40 mg at 05/21/19 0946  . potassium chloride (KLOR-CON) CR tablet 10 mEq  10 mEq Oral Daily Sherryll Burger, Pratik D, DO   10 mEq at 05/21/19 0946  . sodium chloride flush (NS) 0.9 % injection 3 mL  3 mL Intravenous Q12H Shah, Pratik D, DO   3 mL at 05/21/19 0947  . sodium chloride flush (NS) 0.9 % injection 3 mL  3 mL Intravenous PRN Maurilio Lovely D, DO         Discharge Medications: Please see discharge summary for a list of discharge medications.  Relevant Imaging Results:  Relevant Lab Results:   Additional Information SSN #: [id number]  Ewing Schlein, LCSW

## 2019-05-21 NOTE — TOC Initial Note (Signed)
Transition of Care [facility]) - Initial/Assessment Note    Patient Details  Name: [name] MRN: [id number] Date of Birth: Jun 17, 1932  Transition of Care [facility]) CM/SW Contact:    Ewing Schlein, LCSW Phone Number: 05/21/2019, 3:38 PM  Clinical Narrative: Spoke with patient's son, Tiannah Greenly, with patient's permission as patient has difficulty hearing with hearing aids. Discussed placement options and son requested [facility] for SNF placement. TOC completed initial assessment and FL2. FL2 referral sent to [facility] and several additional SNFs.              Expected Discharge Plan: Skilled Nursing Facility Barriers to Discharge: Continued Medical Work up   Patient Goals and CMS Choice Patient states their goals for this hospitalization and ongoing recovery are:: Return home after SNF CMS Medicare.gov Compare Post Acute Care list provided to:: Patient Represenative (must comment)(Patient's son, Yamari Ventola, as pt. is hard of hearing) Choice offered to / list presented to : Adult Children(Spoke with son, Liliya Fullenwider. Requested [employer].)  Expected Discharge Plan and Services Expected Discharge Plan: Skilled Nursing Facility     Living arrangements for the past 2 months: Single Family Home                   Prior Living Arrangements/Services Living arrangements for the past 2 months: Single Family Home Lives with:: Self Patient language and need for interpreter reviewed:: Yes Do you feel safe going back to the place where you live?: Yes      Need for Family Participation in Patient Care: Yes (Comment)(Patient is hearing impaired) Care giver support system in place?: Yes (comment)   Criminal Activity/Legal Involvement Pertinent to Current Situation/Hospitalization: No - Comment as needed  Activities of Daily Living Home Assistive Devices/Equipment: Blood pressure cuff, Eyeglasses, Hearing aid, Cane (specify quad or straight) ADL Screening (condition at time of  admission) Patient's cognitive ability adequate to safely complete daily activities?: Yes Is the patient deaf or have difficulty hearing?: No Does the patient have difficulty seeing, even when wearing glasses/contacts?: No Does the patient have difficulty concentrating, remembering, or making decisions?: No Patient able to express need for assistance with ADLs?: Yes Does the patient have difficulty dressing or bathing?: No Independently performs ADLs?: Yes (appropriate for developmental age) Does the patient have difficulty walking or climbing stairs?: No Weakness of Legs: None Weakness of Arms/Hands: None  Permission Sought/Granted Permission sought to share information with : Family Supports(Anthony Bevill (son) PH: [phone])    Emotional Assessment   Attitude/Demeanor/Rapport: Engaged Affect (typically observed): Accepting, Appropriate Orientation: : Oriented to Self, Oriented to Place, Oriented to  Time, Oriented to Situation  Admission diagnosis:  Acute hypoxemic respiratory failure (HCC) [J96.01] Acute congestive heart failure, unspecified heart failure type [facility]) [I50.9] Patient Active Problem List   Diagnosis Date Noted  . Acute congestive heart failure (HCC) 05/20/2019  . Volume overload 05/20/2019  . Acute hypoxemic respiratory failure (HCC) 05/19/2019  . Venous stasis of both lower extremities 01/13/2019  . Generalized anxiety disorder 11/07/2018  . Non-rheumatic mitral regurgitation 03/20/2017  . Thrombocytopenia (HCC) 09/14/2016  . Hemoglobin decreased 09/14/2016  . Hearing loss 01/06/2015  . Major depressive disorder, recurrent episode, moderate (HCC) 06/07/2014  . Thoracic aorta atherosclerosis (HCC) 03/04/2014  . Degenerative arthritis of thoracic spine 03/04/2014  . Osteoporosis, post-menopausal 08/12/2013  . Metabolic syndrome 06/23/2013  . Anemia, iron deficiency 03/16/2013  . Vitamin D deficiency 03/16/2013  . HTN (hypertension) 03/16/2013  .  Hypothyroidism   . Diaphragmatic  hernia without mention of obstruction or gangrene   . GERD (gastroesophageal reflux disease)   . Hyperlipidemia   . Prolapse of vaginal walls without mention of uterine prolapse   . Arthritis   . First degree atrioventricular block   . Symptomatic menopausal or female climacteric states   . AF (atrial fibrillation) ([place])   . Rectal polyp   . Gastric polyp    PCP:  Janora Norlander, DO Pharmacy:   [facility], [place] [address] Phone: [phone] Fax: [phone]   Readmission Risk Interventions No flowsheet data found.

## 2019-05-21 NOTE — Evaluation (Signed)
Physical Therapy Evaluation Patient Details Name: [name] MRN: [id number] DOB: February 05, 1933 Today's Date: 05/21/2019   History of Present Illness  [name] is a 86 y.o. female with medical history significant for atrial fibrillation on Eliquis, GERD, hypertension, hypothyroidism, lymphedema, chronic anemia, and CKD stage IIIb who presented to her PCP office with worsening bilateral lower extremity swelling and some shortness of breath with exertion that has been persisting over the last 1 month.  She also states that she has gained at least 10 pounds over the last few months and recalls her last weight being approximately 200 pounds.  She was able to walk into the office with her O2 saturations between 60th and 80th percentile on room air.  She was noted to be cyanotic with her lips and fingers.  She was placed on 2 L nasal cannula oxygen with increase in oxygen to 94%.  She was sent to the ED for further evaluation.  She denies any orthopnea or paroxysmal nocturnal dyspnea.  No chest pain, fevers, or chills noted.  She has had a mild nonproductive cough for the past few days.    Clinical Impression  Patient limited for functional mobility as stated below secondary to BLE weakness, fatigue and poor standing balance. Patient able to transition to seated with assist and becomes dizzy which decreases upon remaining seated.  Patient reports grogginess throughout session, appears lethargic at times during session, and has occasional difficulty finding words. While in bed her O2 sat remains above 95% which decreases to 85% with/following standing/ambulation. Patient is instructed to breathe through nose to improve intake of supplemental O2 that is used throughout today's session. She demonstrates impaired balance with use of SPC which improves with use of RW. Patient is returned to bed. Patient does not appear safe to return home alone with no assist at this time due to weakness and balance deficits.  Patient will benefit from continued physical therapy in hospital and recommended venue below to increase strength, balance, endurance for safe ADLs and gait.     Follow Up Recommendations SNF    Equipment Recommendations  None recommended by PT    Recommendations for Other Services       Precautions / Restrictions Precautions Precautions: Fall Restrictions Weight Bearing Restrictions: No      Mobility  Bed Mobility Overal bed mobility: Needs Assistance Bed Mobility: Supine to Sit;Sit to Supine     Supine to sit: Min assist;HOB elevated Sit to supine: Min assist   General bed mobility comments: slow, labored, min verbal cueing; O2 sat stays between 95-97% on2L O2  Transfers Overall transfer level: Needs assistance Equipment used: Straight cane;Rolling walker (2 wheeled) Transfers: Sit to/from [employer] Sit to Stand: Mod assist Stand pivot transfers: Mod assist       General transfer comment: Patient able to transition to standing using SPC but is unsteady upon standing, balance improves using RW  Ambulation/Gait Ambulation/Gait assistance: Min assist Gait Distance (Feet): 20 Feet Assistive device: Rolling walker (2 wheeled) Gait Pattern/deviations: Decreased step length - right;Decreased step length - left;Decreased stride length;Drifts right/left Gait velocity: slow   General Gait Details: Slow, labored gait; patient unsteady with use of SPC and reaches for nearby objects/wall for support, balance improves with use of RW but patient continues to ambulate with slow, unsteady gait, fatigues quickly,  patient frequently drifting/weaving R/L, Spo2 decreases to 85% on 2L  Stairs            Wheelchair Mobility  Modified Rankin (Stroke Patients Only)       Balance Overall balance assessment: Needs assistance Sitting-balance support: Feet supported;No upper extremity supported Sitting balance-Leahy Scale: Fair Sitting balance - Comments:  seated EOB   Standing balance support: Bilateral upper extremity supported Standing balance-Leahy Scale: Poor Standing balance comment: fair/ poor using RW                             Pertinent Vitals/Pain Pain Assessment: No/denies pain    Home Living Family/patient expects to be discharged to:: Private residence Living Arrangements: Alone Available Help at Discharge: (states family does not come around much anymore) Type of Home: House Home Access: Ramped entrance     Home Layout: One level Home Equipment: Cane - single point      Prior Function Level of Independence: Needs assistance   Gait / Transfers Assistance Needed: patient states community ambulator with [facility], drives  ADL's / Homemaking Assistance Needed: patient states she needs assistance but does not have anyone who can help her        Hand Dominance        Extremity/Trunk Assessment   Upper Extremity Assessment Upper Extremity Assessment: Generalized weakness    Lower Extremity Assessment Lower Extremity Assessment: Generalized weakness    Cervical / Trunk Assessment Cervical / Trunk Assessment: Normal  Communication   Communication: HOH  Cognition Arousal/Alertness: Awake/alert;Lethargic Behavior During Therapy: WFL for tasks assessed/performed;Flat affect Overall Cognitive Status: No family/caregiver present to determine baseline cognitive functioning                                 General Comments: Patient reports grogginess throughout session, appears lethargic at times during session, she has occasional difficulty finding words      General Comments      Exercises     Assessment/Plan    PT Assessment Patient needs continued PT services  PT Problem List Decreased strength;Decreased mobility;Decreased activity tolerance;Cardiopulmonary status limiting activity;Decreased knowledge of use of DME;Decreased balance;Obesity       PT Treatment Interventions DME  instruction;Therapeutic exercise;Balance training;Gait training;Neuromuscular re-education;Functional mobility training;Therapeutic activities;Patient/family education    PT Goals (Current goals can be found in the Care Plan section)  Acute Rehab PT Goals Patient Stated Goal: Return home PT Goal Formulation: With patient Time For Goal Achievement: 06/04/19 Potential to Achieve Goals: Fair    Frequency Min 3X/week   Barriers to discharge        Co-evaluation               AM-PAC PT "6 Clicks" Mobility  Outcome Measure Help needed turning from your back to your side while in a flat bed without using bedrails?: A Little Help needed moving from lying on your back to sitting on the side of a flat bed without using bedrails?: A Little Help needed moving to and from a bed to a chair (including a wheelchair)?: A Lot Help needed standing up from a chair using your arms (e.g., wheelchair or bedside chair)?: A Lot Help needed to walk in hospital room?: A Lot Help needed climbing 3-5 steps with a railing? : A Lot 6 Click Score: 14    End of Session Equipment Utilized During Treatment: Gait belt;Oxygen Activity Tolerance: Patient limited by fatigue Patient left: in bed;with bed alarm set;with call bell/phone within reach Nurse Communication: Mobility status PT Visit Diagnosis: Unsteadiness on feet (  R26.81);Other abnormalities of gait and mobility (R26.89);Muscle weakness (generalized) (M62.81)    Time: 6004-5997 PT Time Calculation (min) (ACUTE ONLY): 38 min   Charges:   PT Evaluation $PT Eval Moderate Complexity: 1 Mod PT Treatments $Therapeutic Activity: 23-37 mins        11:47 AM, 05/21/19 Wyman Songster PT, DPT Physical Therapist at [facility]

## 2019-05-21 NOTE — Progress Notes (Signed)
PROGRESS NOTE [facility]   [name]  TDH:[id number]  DOB: [birth year]  DOA: 05/19/2019 PCP: Raliegh Ip, DO  Brief Admission Hx: 86 year old female with A. fib on apixaban, GERD, hypertension, hypothyroidism, chronic lymphedema and chronic anemia, stage IIIb CKD presented with increasing lower extremity edema and shortness of breath with exertion progressive over the past 4 to 6 weeks.  Patient also recalled a 10 pound weight gain.  Patient was sent from PCP office with concerns for new findings of CHF.  MDM/Assessment & Plan:   1. Heart failure, preserved EF-patient responding well to IV diuresis with Lasix.  She is clinically starting to show some improvement.  Continue IV Lasix for diuresis.  Continue to monitor intake and output, weight and electrolytes.  She remains volume overloaded and will require further IV diuresis.  Elevate extremities.  TED hose as tolerated. 2. Acute hypoxic respiratory failure secondary to volume overload-improving with IV diuresis continue as noted above.  Follow 2D echocardiogram.  Wean oxygen as tolerated. 3. Permanent atrial fibrillation-rate is well controlled.  Continue apixaban for full anticoagulation. 4. Thrombocytopenia-following closely as patient is fully anticoagulated with apixaban.  CBC in a.m. 5. Large pericardial effusion - noted on echo, no tamponade, spoke with cardiologist Dr. Purvis Sheffield, recommended repeating limited Echo on 05/23/19 to reassess.   6. Stage 3b CKD-holding stable with IV diuresis, repeat in a.m. 7. Essential hypertension-patient's blood pressures are well controlled. 8. Hypothyroidism-stable on levothyroxine. 9. GERD-Protonix ordered for GI protection. 10. Chronic lymphedema-diuresis as noted above, lower extremity compression stockings as tolerated.  DVT prophylaxis: Apixaban / TED Hose Code Status: Full Family Communication: Patient updated at bedside, verbalized understanding Disposition Plan: From  home, continues to require IV diuresis with Lasix for symptom relief, follow-up limited 2D echocardiogram to reasses large pleural effusion on 05/23/19 and repeat electrolytes in a.m.  Consultants:    Procedures:  2D Echocardiogram 05/20/19 IMPRESSIONS 1. Left ventricular ejection fraction, by estimation, is 60 to 65%. The left ventricle has normal function. The left ventricle has no regional  wall motion abnormalities. There is mild concentric left ventricular hypertrophy. Left ventricular diastolic parameters are indeterminate. Elevated left ventricular end-diastolic pressure.  2. Right ventricular systolic function is normal. The right ventricular size is mildly enlarged. There is severely elevated pulmonary artery systolic pressure.  3. Left atrial size was severely dilated.  4. Right atrial size was severely dilated.  5. IVC collapses. No RA or RV collapse. No signs of tamponade. Large pericardial effusion. The pericardial effusion is circumferential.  6. The mitral valve is grossly normal. Mild mitral valve regurgitation.  7. Tricuspid valve regurgitation is moderate.  8. The aortic valve is tricuspid. Aortic valve regurgitation is mild. Mild aortic valve stenosis. Aortic valve area, by VTI measures 1.57 cm. Aortic valve mean gradient measures 17.0 mmHg. Aortic valve Vmax measures 2.79 m/s.  9. The inferior vena cava is dilated in size with >50% respiratory variability, suggesting right atrial pressure of 8 mmHg.   Antimicrobials:     Subjective: Pt reports slight improvement in leg edema with TED hoses in place.   Objective: Vitals:   05/20/19 1324 05/20/19 2012 05/20/19 2102 05/21/19 0519  BP: (!) 100/52  122/86 113/70  Pulse: 73  67 77  Resp: 18  20 20   Temp: 98 F (36.7 C)  98.8 F (37.1 C) 98.4 F (36.9 C)  TempSrc: Oral  Oral Oral  SpO2: 98% 99% 98% 95%  Weight:      Height:  Intake/Output Summary (Last 24 hours) at 05/21/2019 1321 Last data filed  at 05/21/2019 2458 Gross per 24 hour  Intake 483 ml  Output 600 ml  Net -117 ml   Filed Weights   05/19/19 1131 05/19/19 1553 05/20/19 0500  Weight: 95 kg 92.7 kg 94.7 kg   REVIEW OF SYSTEMS  As per history otherwise all reviewed and reported negative  Exam:  General exam: Awake, alert, no apparent distress, cooperative.  Elderly female. Respiratory system: rare bibasilar crackles.  No increased work of breathing. Cardiovascular system: Normal S1 & S2 heard. No JVD, murmurs, gallops, clicks.  1+ pitting pedal edema. Gastrointestinal system: Abdomen is nondistended, soft and nontender. Normal bowel sounds heard. Central nervous system: Alert and oriented. No focal neurological deficits. Extremities: 2+ pitting edema bilateral lower extremities.  Data Reviewed: Basic Metabolic Panel: Recent Labs  Lab 05/19/19 1200 05/20/19 0433 05/21/19 0419  NA 143 145 143  K 4.5 4.4 4.2  CL 99 99 96*  CO2 35* 35* 37*  GLUCOSE 113* 80 92  BUN 33* 31* 31*  CREATININE 1.49* 1.38* 1.35*  CALCIUM 9.2 9.0 9.0  MG  --  2.2 2.3   Liver Function Tests: Recent Labs  Lab 05/19/19 1200  AST 28  ALT 15  ALKPHOS 104  BILITOT 1.4*  PROT 7.1  ALBUMIN 3.8   No results for input(s): LIPASE, AMYLASE in the last 168 hours. No results for input(s): AMMONIA in the last 168 hours. CBC: Recent Labs  Lab 05/19/19 1200 05/20/19 0433 05/21/19 0419  WBC 4.1 3.6* 4.3  HGB 11.8* 11.5* 11.1*  HCT 39.9 38.3 38.3  MCV 103.9* 105.5* 107.0*  PLT 94* 87* 90*   Cardiac Enzymes: No results for input(s): CKTOTAL, CKMB, CKMBINDEX, TROPONINI in the last 168 hours. CBG (last 3)  No results for input(s): GLUCAP in the last 72 hours. Recent Results (from the past 240 hour(s))  Respiratory Panel by RT PCR (Flu A&B, Covid) - Nasopharyngeal Swab     Status: None   Collection Time: 05/19/19 12:27 PM   Specimen: Nasopharyngeal Swab  Result Value Ref Range Status   SARS Coronavirus 2 by RT PCR NEGATIVE  NEGATIVE Final    Comment: (NOTE) SARS-CoV-2 target nucleic acids are NOT DETECTED. The SARS-CoV-2 RNA is generally detectable in upper respiratoy specimens during the acute phase of infection. The lowest concentration of SARS-CoV-2 viral copies this assay can detect is 131 copies/mL. A negative result does not preclude SARS-Cov-2 infection and should not be used as the sole basis for treatment or other patient management decisions. A negative result may occur with  improper specimen collection/handling, submission of specimen other than nasopharyngeal swab, presence of viral mutation(s) within the areas targeted by this assay, and inadequate number of viral copies (<131 copies/mL). A negative result must be combined with clinical observations, patient history, and epidemiological information. The expected result is Negative. Fact Sheet for Patients:  https://www.moore.com/ Fact Sheet for Healthcare Providers:  https://www.young.biz/ This test is not yet ap proved or cleared by the [place] FDA and  has been authorized for detection and/or diagnosis of SARS-CoV-2 by FDA under an Emergency Use Authorization (EUA). This EUA will remain  in effect (meaning this test can be used) for the duration of the COVID-19 declaration under Section 564(b)(1) of the Act, 21 U.S.C. section 360bbb-3(b)(1), unless the authorization is terminated or revoked sooner.    Influenza A by PCR NEGATIVE NEGATIVE Final   Influenza B by PCR NEGATIVE NEGATIVE Final  Comment: (NOTE) The Xpert Xpress SARS-CoV-2/FLU/RSV assay is intended as an aid in  the diagnosis of influenza from Nasopharyngeal swab specimens and  should not be used as a sole basis for treatment. Nasal washings and  aspirates are unacceptable for Xpert Xpress SARS-CoV-2/FLU/RSV  testing. Fact Sheet for Patients: https://www.moore.com/ Fact Sheet for Healthcare  Providers: https://www.young.biz/ This test is not yet approved or cleared by the [place] FDA and  has been authorized for detection and/or diagnosis of SARS-CoV-2 by  FDA under an Emergency Use Authorization (EUA). This EUA will remain  in effect (meaning this test can be used) for the duration of the  Covid-19 declaration under Section 564(b)(1) of the Act, 21  U.S.C. section 360bbb-3(b)(1), unless the authorization is  terminated or revoked. Performed at [facility], [address], [place], [place] [address]      Studies: ECHOCARDIOGRAM COMPLETE  Result Date: 05/20/2019    ECHOCARDIOGRAM REPORT   Patient Name:   [name] Date of Exam: 05/20/2019 Medical Rec #:  [id number]      Height:       64.0 in Accession #:    [id number]     Weight:       208.8 lb Date of Birth:  1932/05/31      BSA:          1.99 m Patient Age:    86 years       BP:           100/52 mmHg Patient Gender: F              HR:           73 bpm. [facility] Procedure: 2D Echo Indications:    CHF-Acute Diastolic 428.31 / I50.31  History:        Patient has prior history of Echocardiogram examinations, most                 recent 03/16/2015. Arrythmias:Atrial Fibrillation; Risk                 Factors:Dyslipidemia, Hypertension and Non-Smoker. Acute                 hypoxemic respiratory failure , First degree atrioventricular                 block, GERD, Non-rheumatic mitral regurgitation.  Sonographer:    Jeryl Columbia RDCS (AE) Referring Phys: [id number] [facility] IMPRESSIONS  1. Left ventricular ejection fraction, by estimation, is 60 to 65%. The left ventricle has normal function. The left ventricle has no regional wall motion abnormalities. There is mild concentric left ventricular hypertrophy. Left ventricular diastolic parameters are indeterminate. Elevated left ventricular end-diastolic pressure.  2. Right ventricular systolic function is normal. The right ventricular size is  mildly enlarged. There is severely elevated pulmonary artery systolic pressure.  3. Left atrial size was severely dilated.  4. Right atrial size was severely dilated.  5. IVC collapses. No RA or RV collapse. No signs of tamponade. Large pericardial effusion. The pericardial effusion is circumferential.  6. The mitral valve is grossly normal. Mild mitral valve regurgitation.  7. Tricuspid valve regurgitation is moderate.  8. The aortic valve is tricuspid. Aortic valve regurgitation is mild. Mild aortic valve stenosis. Aortic valve area, by VTI measures 1.57 cm. Aortic valve mean gradient measures 17.0 mmHg. Aortic valve Vmax measures 2.79 m/s.  9. The inferior vena cava is dilated in size with >50% respiratory variability,  suggesting right atrial pressure of 8 mmHg. FINDINGS  Left Ventricle: Left ventricular ejection fraction, by estimation, is 60 to 65%. The left ventricle has normal function. The left ventricle has no regional wall motion abnormalities. The left ventricular internal cavity size was normal in size. There is  mild concentric left ventricular hypertrophy. Left ventricular diastolic parameters are indeterminate. Elevated left ventricular end-diastolic pressure. Right Ventricle: The right ventricular size is mildly enlarged. No increase in right ventricular wall thickness. Right ventricular systolic function is normal. There is severely elevated pulmonary artery systolic pressure. The tricuspid regurgitant velocity is 4.02 m/s, and with an assumed right atrial pressure of 10 mmHg, the estimated right ventricular systolic pressure is 74.6 mmHg. Left Atrium: Left atrial size was severely dilated. Right Atrium: Right atrial size was severely dilated. Pericardium: IVC collapses. No RA or RV collapse. No signs of tamponade. A large pericardial effusion is present. The pericardial effusion is circumferential. Mitral Valve: The mitral valve is grossly normal. Mild mitral valve regurgitation. Tricuspid Valve:  The tricuspid valve is grossly normal. Tricuspid valve regurgitation is moderate. Aortic Valve: The aortic valve is tricuspid. Aortic valve regurgitation is mild. Aortic regurgitation PHT measures 540 msec. Mild aortic stenosis is present. Moderate aortic valve annular calcification. There is mild calcification of the aortic valve. Aortic valve mean gradient measures 17.0 mmHg. Aortic valve peak gradient measures 31.1 mmHg. Aortic valve area, by VTI measures 1.57 cm. Pulmonic Valve: The pulmonic valve was grossly normal. Pulmonic valve regurgitation is not visualized. Aorta: The aortic root is normal in size and structure. Venous: The inferior vena cava is dilated in size with greater than 50% respiratory variability, suggesting right atrial pressure of 8 mmHg. IAS/Shunts: No atrial level shunt detected by color flow Doppler.  LEFT VENTRICLE PLAX 2D LVIDd:         5.12 cm  Diastology LVIDs:         3.07 cm  LV e' lateral:   8.81 cm/s LV PW:         1.34 cm  LV E/e' lateral: 15.3 LV IVS:        1.15 cm  LV e' medial:    6.53 cm/s LVOT diam:     1.80 cm  LV E/e' medial:  20.7 LV SV:         91.86 ml LV SV Index:   41.66 LVOT Area:     2.54 cm  LEFT ATRIUM              Index       RIGHT ATRIUM           Index LA diam:        4.90 cm  2.46 cm/m  RA Area:     38.50 cm LA Vol (A2C):   186.0 ml 93.35 ml/m RA Volume:   166.00 ml 83.32 ml/m LA Vol (A4C):   160.0 ml 80.30 ml/m LA Biplane Vol: 173.0 ml 86.83 ml/m  AORTIC VALVE AV Area (Vmax):    1.62 cm AV Area (Vmean):   1.49 cm AV Area (VTI):     1.57 cm AV Vmax:           278.67 cm/s AV Vmean:          192.333 cm/s AV VTI:            0.584 m AV Peak Grad:      31.1 mmHg AV Mean Grad:      17.0 mmHg LVOT Vmax:  177.33 cm/s LVOT Vmean:        112.667 cm/s LVOT VTI:          0.361 m LVOT/AV VTI ratio: 0.62 AI PHT:            540 msec  AORTA Ao Root diam: 2.80 cm MITRAL VALVE                TRICUSPID VALVE MV Area (PHT): 2.42 cm     TR Peak grad:   64.6 mmHg  MV Decel Time: 313 msec     TR Vmax:        402.00 cm/s MR Peak grad: 63.0 mmHg MR Mean grad: 45.0 mmHg     SHUNTS MR Vmax:      397.00 cm/s   Systemic VTI:  0.36 m MR Vmean:     320.0 cm/s    Systemic Diam: 1.80 cm MV E velocity: 135.00 cm/s MV A velocity: 39.40 cm/s MV E/A ratio:  3.43 Kate Sable MD Electronically signed by Kate Sable MD Signature Date/Time: 05/20/2019/2:55:41 PM    Final    Scheduled Meds: . apixaban  5 mg Oral BID  . calcium-vitamin D  1 tablet Oral Q breakfast  . cholecalciferol  2,000 Units Oral Daily  . furosemide  40 mg Intravenous Q12H  . iron polysaccharides  150 mg Oral Daily  . levothyroxine  137 mcg Oral Q0600  . multivitamin with minerals  1 tablet Oral Daily  . pantoprazole  40 mg Oral Daily  . potassium chloride  10 mEq Oral Daily  . sodium chloride flush  3 mL Intravenous Q12H   Continuous Infusions: . sodium chloride     Principal Problem:   Acute hypoxemic respiratory failure (HCC) Active Problems:   Hypothyroidism   GERD (gastroesophageal reflux disease)   Hyperlipidemia   AF (atrial fibrillation) (HCC)   Vitamin D deficiency   Metabolic syndrome   Venous stasis of both lower extremities   Acute congestive heart failure (HCC)   Volume overload  Time spent:   Irwin Brakeman, MD [facility] 05/21/2019, 1:21 PM    LOS: 2 days  How to contact the [facility] Attending or Consulting provider [place] or covering provider during after hours [place], for this patient?  1. Check the care team in [facility] and look for a) attending/consulting TRH provider listed and b) the [facility] team listed 2. Log into [email or web address] and use [facility] universal password to access. If you do not have the password, please contact the hospital operator. 3. Locate the [facility] provider you are looking for under [facility] and page to a number that you can be directly reached. 4. If you still have difficulty reaching the provider, please page the [facility] (Director  on Call) for the Hospitalists listed on amion for assistance.

## 2019-05-21 NOTE — Plan of Care (Signed)

## 2019-05-22 ENCOUNTER — Ambulatory Visit: Payer: Medicare Other

## 2019-05-22 ENCOUNTER — Inpatient Hospital Stay (HOSPITAL_COMMUNITY): Payer: Medicare Other

## 2019-05-22 DIAGNOSIS — K219 Gastro-esophageal reflux disease without esophagitis: Secondary | ICD-10-CM

## 2019-05-22 DIAGNOSIS — K220 Achalasia of cardia: Secondary | ICD-10-CM

## 2019-05-22 DIAGNOSIS — E8881 Metabolic syndrome: Secondary | ICD-10-CM

## 2019-05-22 DIAGNOSIS — R1310 Dysphagia, unspecified: Secondary | ICD-10-CM

## 2019-05-22 DIAGNOSIS — I4821 Permanent atrial fibrillation: Secondary | ICD-10-CM

## 2019-05-22 DIAGNOSIS — I5031 Acute diastolic (congestive) heart failure: Secondary | ICD-10-CM

## 2019-05-22 DIAGNOSIS — J9601 Acute respiratory failure with hypoxia: Secondary | ICD-10-CM

## 2019-05-22 DIAGNOSIS — E8770 Fluid overload, unspecified: Secondary | ICD-10-CM

## 2019-05-22 LAB — BASIC METABOLIC PANEL
Anion gap: 10 (ref 5–15)
BUN: 30 mg/dL — ABNORMAL HIGH (ref 8–23)
CO2: 37 mmol/L — ABNORMAL HIGH (ref 22–32)
Calcium: 9.1 mg/dL (ref 8.9–10.3)
Chloride: 95 mmol/L — ABNORMAL LOW (ref 98–111)
Creatinine, Ser: 1.29 mg/dL — ABNORMAL HIGH (ref 0.44–1.00)
GFR calc Af Amer: 44 mL/min — ABNORMAL LOW (ref >60)
GFR calc non Af Amer: 38 mL/min — ABNORMAL LOW (ref >60)
Glucose, Bld: 107 mg/dL — ABNORMAL HIGH (ref 70–99)
Potassium: 4.4 mmol/L (ref 3.5–5.1)
Sodium: 142 mmol/L (ref 135–145)

## 2019-05-22 LAB — CBC
HCT: 39 % (ref 36.0–46.0)
Hemoglobin: 11.5 g/dL — ABNORMAL LOW (ref 12.0–15.0)
MCH: 31.7 pg (ref 26.0–34.0)
MCHC: 29.5 g/dL — ABNORMAL LOW (ref 30.0–36.0)
MCV: 107.4 fL — ABNORMAL HIGH (ref 80.0–100.0)
Platelets: 88 10*3/uL — ABNORMAL LOW (ref 150–400)
RBC: 3.63 MIL/uL — ABNORMAL LOW (ref 3.87–5.11)
RDW: 15.9 % — ABNORMAL HIGH (ref 11.5–15.5)
WBC: 4.6 10*3/uL (ref 4.0–10.5)
nRBC: 0 % (ref 0.0–0.2)

## 2019-05-22 LAB — MAGNESIUM: Magnesium: 2.3 mg/dL (ref 1.7–2.4)

## 2019-05-22 MED ORDER — SALINE SPRAY 0.65 % NA SOLN: 1.0000 | NASAL | Status: DC | PRN

## 2019-05-22 NOTE — Care Management (Signed)
[id number] A PASSR received.

## 2019-05-22 NOTE — TOC Progression Note (Signed)
Transition of Care [facility]) - Progression Note    Patient Details  Name: [name] MRN: [id number] Date of Birth: 1932/07/27  Transition of Care [facility]) CM/SW Contact  Ewing Schlein, LCSW Phone Number: 05/22/2019, 2:28 PM  Clinical Narrative: Spoke with son to discuss SNF bed offers and son reported he was unsure to which SNF he wants the patient transferred. Discussed patient's options of transferring to a SNF or discharging home. [facility] notified patient will not be discharging today and TOC was informed the bed could be held until the beginning of next week. Informed son he will need to decide on placement prior to d/c.  Expected Discharge Plan: Skilled Nursing Facility Barriers to Discharge: Continued Medical Work up  Expected Discharge Plan and Services Expected Discharge Plan: Skilled Nursing Facility     Living arrangements for the past 2 months: Single Family Home                  Readmission Risk Interventions No flowsheet data found.

## 2019-05-22 NOTE — Progress Notes (Signed)
SLP Cancellation Note  Patient Details Name: [name] MRN: [id number] DOB: 07/22/1932   Cancelled treatment:        Eval not completed secondary to: MD requests ST hold on PO trials until clearance is received from GI. ST will defer BSE until that time. Thank you,  Riggins Cisek H. Romie Levee, CCC-SLP Speech Language Pathologist    Georgetta Haber 05/22/2019, 2:37 PM

## 2019-05-22 NOTE — TOC Progression Note (Signed)
Transition of Care [facility]) - Progression Note    Patient Details  Name: [name] MRN: [id number] Date of Birth: Feb 02, 1933  Transition of Care [facility]) CM/SW Contact  Avion Kutzer, Chrystine Oiler, RN Phone Number: 05/22/2019, 3:18 PM  Clinical Narrative:   Patient now not ready to discharge. Insurance auth halted and will need to be restarted when patient is medically ready next week. Son is now unsure of his wishes for SNF, asking to follow up with CM on Monday.     Expected Discharge Plan: Skilled Nursing Facility Barriers to Discharge: Continued Medical Work up  Expected Discharge Plan and Services Expected Discharge Plan: Skilled Nursing Facility       Living arrangements for the past 2 months: Single Family Home                            Social Determinants of Health (SDOH) Interventions    Readmission Risk Interventions No flowsheet data found.

## 2019-05-22 NOTE — Care Management Important Message (Signed)
Important Message  Patient Details  Name: [name] MRN: [id number] Date of Birth: 03-30-1933   Medicare Important Message Given:  Yes     Corey Harold 05/22/2019, 2:46 PM

## 2019-05-22 NOTE — Consult Note (Addendum)
Referring Provider: [facility] Primary Care Physician:  Raliegh IpGottschalk, Ashly M, DO Primary Gastroenterologist:  Dr. Dorena CookeyJohn Hayes [facility])  Date of Admission: 05/18/18 Date of Consultation: 05/21/18  Reason for Consultation:  Achalasia on BPE  HPI:  [name] CzechBessie F Le is a 86 y.o. female with a past medical history of A. Fib on Eliquis  anticoagulation, diaphragmatic hernia, esophagitis, GERD, hypertension, chronic anemia, CKD stage IIIb who presented to the emergency department with worsening bilateral lower extremity swelling and dyspnea on exertion for the previous month.  She was admitted to the hospital for acute hypoxemic respiratory failure secondary to volume overload bilateral pleural effusions.  Over the past few days she has responded well to diuresis with Lasix and showing some improvement.  Remains volume overloaded.  Acute hypoxic respiratory failure improving as well, weaning oxygen as tolerated.  A. fib with rate well controlled.  She has been on Protonix for her GERD as well.  We were asked to see her for dysphagia/achalasia.  At some point today she went for a barium pill esophagram which found achalasia with minimal emptying of the dilated esophagus and minimal peristalsis in the esophagus.  Also noted persistent spasm in the distal esophagus.  Even in the upright position only a small amount of barium trickle into the nondistended stomach.  Patient was made n.p.o. and we were consulted for further recommendations.  Plan speech-language pathology bedside swallow but this is on hold as the patient is "water only".  Most recent EGD completed 12/15/2015 for iron deficiency anemia and heme positive stool.  Findings included 3 cm hiatal hernia, entire examined stomach normal, duodenum normal.  Today she states she's doing ok overall. Has had dysphagia symptoms for "years." Does not feel like it's any worse currently. Denies any regurgitation or post-prandial coughing. Denies N/V. Mild  intermittent abdominal discomfort. GERD doing ok currently on PPI. Denies any other GI complaints.  Past Medical History:  Diagnosis Date   AF (atrial fibrillation) (HCC)    Arthritis    Diaphragmatic hernia without mention of obstruction or gangrene    Esophagitis    First degree atrioventricular block    Gastric polyp    GERD (gastroesophageal reflux disease)    Heme positive stool    Hyperlipidemia    Hypertension    Hypothyroid    Prolapse of vaginal walls without mention of uterine prolapse    Rectal polyp     Past Surgical History:  Procedure Laterality Date   DILATION AND CURETTAGE OF UTERUS     KIDNEY STONE SURGERY      Prior to Admission medications   Medication Sig Start Date End Date Taking? Authorizing Provider  calcium-vitamin D (OSCAL WITH D) 500-200 MG-UNIT per tablet Take 1 tablet by mouth daily.   Yes [provider]  Cholecalciferol (VITAMIN D3) 2000 UNITS TABS Take 1 tablet by mouth daily.     Yes [provider]  ELIQUIS 5 MG TABS tablet TAKE  (1)  TABLET TWICE A DAY. Patient taking differently: Take 5 mg by mouth 2 (two) times daily.  05/14/19  Yes Rollene RotundaHochrein, James, MD  furosemide (LASIX) 40 MG tablet TAKE 1 TABLET DAILY. Patient taking differently: Take 40 mg by mouth daily.  04/24/19  Yes Gottschalk, Kathie RhodesAshly M, DO  gabapentin (NEURONTIN) 100 MG capsule Needs to be seen for further refills. One by mouth twice daily. Patient taking differently: Take 100 mg by mouth daily.  05/14/19  Yes Gottschalk, Kathie RhodesAshly M, DO  iron polysaccharides (FERREX 150)  150 MG capsule Take 1 capsule (150 mg total) by mouth daily. 03/03/19  Yes Gottschalk, Ashly M, DO  KLOR-CON 10 10 MEQ tablet TAKE 1 TABLET DAILY Patient taking differently: Take 10 mEq by mouth at bedtime.  05/14/19  Yes Delynn Flavin M, DO  levothyroxine (SYNTHROID) 137 MCG tablet Take 1 tablet (137 mcg total) by mouth daily. as directed 11/10/18  Yes Gottschalk, Ashly M, DO  lisinopril (ZESTRIL) 40 MG  tablet TAKE 1 TABLET DAILY Patient taking differently: Take 40 mg by mouth daily.  04/24/19  Yes Gottschalk, Ashly M, DO  LORazepam (ATIVAN) 0.5 MG tablet One half tablet to one daily if needed for anxiety Patient taking differently: Take 0.25-0.5 mg by mouth daily as needed for anxiety.  11/07/18  Yes Delynn Flavin M, DO  Multiple Vitamins-Minerals (CENTRUM SILVER) tablet Take 1 tablet by mouth daily.    Yes [provider]  omeprazole (PRILOSEC) 20 MG capsule TAKE 1 CAPSULE BY MOUTH TWICE DAILY BEFORE A MEAL (Needs to be seen before next refill) Patient taking differently: Take 20 mg by mouth 2 (two) times daily before a meal.  04/24/19  Yes Gottschalk, Ashly M, DO  polyethylene glycol powder (GLYCOLAX/MIRALAX) powder DISSOLVE 17 GRAMS (ONE CAPFUL) OF POWDER IN WATER & DRINK ONCE DAILY AS NEEDED Patient taking differently: Take 17 g by mouth daily. Doesn't take med regularly. Takes as needed. Patients states she needs it now. 01/08/17  Yes Ernestina Penna, MD  albuterol (PROVENTIL HFA;VENTOLIN HFA) 108 (90 Base) MCG/ACT inhaler Inhale 2 puffs into the lungs every 6 (six) hours as needed for wheezing or shortness of breath. 04/11/18   Ernestina Penna, MD  mupirocin ointment (BACTROBAN) 2 % Place 1 application into the nose 2 (two) times daily. Patient not taking: Reported on 05/20/2019 12/12/18   Dettinger, Elige Radon, MD    Current Facility-Administered Medications  Medication Dose Route Frequency Provider Last Rate Last Admin   0.9 %  sodium chloride infusion  250 mL Intravenous PRN Sherryll Burger, Pratik D, DO       acetaminophen (TYLENOL) tablet 650 mg  650 mg Oral Q6H PRN Maurilio Lovely D, DO   650 mg at 05/21/19 0867   Or   acetaminophen (TYLENOL) suppository 650 mg  650 mg Rectal Q6H PRN Sherryll Burger, Pratik D, DO       albuterol (PROVENTIL) (2.5 MG/3ML) 0.083% nebulizer solution 2.5 mg  2.5 mg Inhalation Q6H PRN Sherryll Burger, Pratik D, DO       apixaban (ELIQUIS) tablet 5 mg  5 mg Oral BID Sherryll Burger, Pratik D, DO    5 mg at 05/22/19 0932   calcium-vitamin D (OSCAL WITH D) 500-200 MG-UNIT per tablet 1 tablet  1 tablet Oral Q breakfast Maurilio Lovely D, DO   1 tablet at 05/22/19 6195   cholecalciferol (VITAMIN D3) tablet 2,000 Units  2,000 Units Oral Daily Maurilio Lovely D, DO   2,000 Units at 05/22/19 0931   furosemide (LASIX) injection 40 mg  40 mg Intravenous Q12H Shah, Pratik D, DO   40 mg at 05/22/19 0932   iron polysaccharides (NIFEREX) capsule 150 mg  150 mg Oral Daily Maurilio Lovely D, DO   150 mg at 05/22/19 0932   levothyroxine (SYNTHROID) tablet 137 mcg  137 mcg Oral Q0600 Maurilio Lovely D, DO   137 mcg at 05/22/19 6712   multivitamin with minerals tablet 1 tablet  1 tablet Oral Daily Maurilio Lovely D, DO   1 tablet at 05/22/19 0931   ondansetron (  ZOFRAN) tablet 4 mg  4 mg Oral Q6H PRN Sherryll BurgerShah, Pratik D, DO       Or   ondansetron (ZOFRAN) injection 4 mg  4 mg Intravenous Q6H PRN Sherryll BurgerShah, Pratik D, DO       pantoprazole (PROTONIX) EC tablet 40 mg  40 mg Oral Daily Sherryll BurgerShah, Pratik D, DO   40 mg at 05/22/19 0932   potassium chloride (KLOR-CON) CR tablet 10 mEq  10 mEq Oral Daily Sherryll BurgerShah, Pratik D, DO   10 mEq at 05/22/19 0931   sodium chloride (OCEAN) 0.65 % nasal spray 1 spray  1 spray Each Nare PRN Johnson, Clanford L, MD       sodium chloride flush (NS) 0.9 % injection 3 mL  3 mL Intravenous Q12H Shah, Pratik D, DO   3 mL at 05/22/19 0943   sodium chloride flush (NS) 0.9 % injection 3 mL  3 mL Intravenous PRN Sherryll BurgerShah, Pratik D, DO        Allergies as of 05/19/2019 - Review Complete 05/19/2019  Allergen Reaction Noted   Aspirin Nausea Only 07/04/2010   Codeine Nausea Only 07/04/2010   Sulfa antibiotics  05/17/2012   Vicodin [hydrocodone-acetaminophen] Nausea Only 07/04/2010   Warfarin sodium Other (See Comments) 07/04/2010   Cephalexin Rash 12/17/2016   Doxycycline Rash 12/09/2018    Family History  Problem Relation Age of Onset   Hypertension Mother    Aneurysm Mother        Brain   Hypertension Father     Kidney disease Father    Aneurysm Father        heart   Arthritis Daughter    Cancer Daughter        breast   Heart attack Daughter    Hyperlipidemia Sister     Social History   Socioeconomic History   Marital status: Divorced    Spouse name: Not on file   Number of children: 2   Years of education: 10th grade   Highest education level: 10th grade  Occupational History   Occupation: retired    Comment: [employer]  Tobacco Use   Smoking status: Never Smoker   Smokeless tobacco: Never Used  Substance and Sexual Activity   Alcohol use: No   Drug use: No   Sexual activity: Not Currently  Other Topics Concern   Not on file  Social History Narrative   Not on file   Social Determinants of Health   Financial Resource Strain:    Difficulty of Paying Living Expenses: Not on file  Food Insecurity:    Worried About Programme researcher, broadcasting/film/videounning Out of Food in the Last Year: Not on file   [employer] Out of Food in the Last Year: Not on file  Transportation Needs:    Lack of Transportation (Medical): Not on file   Lack of Transportation (Non-Medical): Not on file  Physical Activity:    Days of Exercise per Week: Not on file   Minutes of Exercise per Session: Not on file  Stress:    Feeling of Stress : Not on file  Social Connections:    Frequency of Communication with Friends and Family: Not on file   Frequency of Social Gatherings with Friends and Family: Not on file   Attends Religious Services: Not on file   Active Member of Clubs or Organizations: Not on file   Attends BankerClub or Organization Meetings: Not on file   Marital Status: Not on file  Intimate Partner Violence:    Fear of Current  or Ex-Partner: Not on file   Emotionally Abused: Not on file   Physically Abused: Not on file   Sexually Abused: Not on file    Review of Systems: General: Negative for anorexia, weight loss, fever, chills, fatigue, weakness. Eyes: Negative for vision changes.  ENT: Negative for hoarseness, difficulty swallowing  , nasal congestion. CV: Negative for chest pain, angina, palpitations, dyspnea on exertion, peripheral edema.  Respiratory: Negative for dyspnea at rest, dyspnea on exertion, cough, sputum, wheezing.  GI: See history of present illness. GU:  Negative for dysuria, hematuria, urinary incontinence, urinary frequency, nocturnal urination.  MS: Negative for joint pain, low back pain.  Derm: Negative for rash or itching.  Neuro: Negative for weakness, abnormal sensation, seizure, frequent headaches, memory loss, confusion.  Psych: Negative for anxiety, depression, suicidal ideation, hallucinations.  Endo: Negative for unusual weight change.  Heme: Negative for bruising or bleeding. Allergy: Negative for rash or hives.  Physical Exam: Vital signs in last 24 hours: Temp:  [98.1 F (36.7 C)-99.4 F (37.4 C)] 98.1 F (36.7 C) (02/19 0642) Pulse Rate:  [65-82] 82 (02/19 0642) Resp:  [18-20] 20 (02/19 0642) BP: (114-124)/(61-64) 115/61 (02/19 0642) SpO2:  [91 %-100 %] 91 % (02/19 0642) Last BM Date: 05/20/19 General:   Alert,  Well-developed, well-nourished, pleasant and cooperative in NAD Head:  Normocephalic and atraumatic. Eyes:  Sclera clear, no icterus.   Conjunctiva pink. Ears:  Normal auditory acuity. Nose:  No deformity, discharge,  or lesions. Mouth:  No deformity or lesions, dentition normal. Neck:  Supple; no masses or thyromegaly. Lungs:  Clear throughout to auscultation.   No wheezes, crackles, or rhonchi. No acute distress. Heart:  Regular rate and rhythm; no murmurs, clicks, rubs,  or gallops. Abdomen:  Soft, nontender and nondistended. No masses, hepatosplenomegaly or hernias noted. Normal bowel sounds, without guarding, and without rebound.   Rectal:  Deferred until time of colonoscopy.   Msk:  Symmetrical without gross deformities. Normal posture. Pulses:  Normal pulses noted. Extremities:  Without clubbing or edema. Neurologic:  Alert and  oriented x4;  grossly normal  neurologically. Skin:  Intact without significant lesions or rashes. Cervical Nodes:  No significant cervical adenopathy. Psych:  Alert and cooperative. Normal mood and affect.  Intake/Output from previous day: 02/18 0701 - 02/19 0700 In: 143 [P.O.:140; I.V.:3] Out: 350 [Urine:350] Intake/Output this shift: No intake/output data recorded.  Lab Results: Recent Labs    05/20/19 0433 05/21/19 0419 05/22/19 0729  WBC 3.6* 4.3 4.6  HGB 11.5* 11.1* 11.5*  HCT 38.3 38.3 39.0  PLT 87* 90* 88*   BMET Recent Labs    05/20/19 0433 05/21/19 0419 05/22/19 0729  NA 145 143 142  K 4.4 4.2 4.4  CL 99 96* 95*  CO2 35* 37* 37*  GLUCOSE 80 92 107*  BUN 31* 31* 30*  CREATININE 1.38* 1.35* 1.29*  CALCIUM 9.0 9.0 9.1   LFT No results for input(s): PROT, ALBUMIN, AST, ALT, ALKPHOS, BILITOT, BILIDIR, IBILI in the last 72 hours. PT/INR No results for input(s): LABPROT, INR in the last 72 hours. Hepatitis Panel No results for input(s): HEPBSAG, HCVAB, HEPAIGM, HEPBIGM in the last 72 hours. C-Diff No results for input(s): CDIFFTOX in the last 72 hours.  Studies/Results: ECHOCARDIOGRAM COMPLETE  Result Date: 05/20/2019    ECHOCARDIOGRAM REPORT   Patient Name:   VERNER KOPISCHKE Date of Exam: 05/20/2019 Medical Rec #:  [id number]      Height:       64.0 in  Accession #:    [id number]     Weight:       208.8 lb Date of Birth:  [birth year]      BSA:          1.99 m Patient Age:    43 years       BP:           100/52 mmHg Patient Gender: F              HR:           73 bpm. Exam Location:  [facility] Procedure: 2D Echo Indications:    CHF-Acute Diastolic 314.97 / W26.37  History:        Patient has prior history of Echocardiogram examinations, most                 recent 03/16/2015. Arrythmias:Atrial Fibrillation; Risk                 Factors:Dyslipidemia, Hypertension and Non-Smoker. Acute                 hypoxemic respiratory failure , First degree atrioventricular                 block, GERD,  Non-rheumatic mitral regurgitation.  Sonographer:    Leavy Cella RDCS (AE) Referring Phys: [phone] Royanne Foots [place]  1. Left ventricular ejection fraction, by estimation, is 60 to 65%. The left ventricle has normal function. The left ventricle has no regional wall motion abnormalities. There is mild concentric left ventricular hypertrophy. Left ventricular diastolic parameters are indeterminate. Elevated left ventricular end-diastolic pressure.  2. Right ventricular systolic function is normal. The right ventricular size is mildly enlarged. There is severely elevated pulmonary artery systolic pressure.  3. Left atrial size was severely dilated.  4. Right atrial size was severely dilated.  5. IVC collapses. No RA or RV collapse. No signs of tamponade. Large pericardial effusion. The pericardial effusion is circumferential.  6. The mitral valve is grossly normal. Mild mitral valve regurgitation.  7. Tricuspid valve regurgitation is moderate.  8. The aortic valve is tricuspid. Aortic valve regurgitation is mild. Mild aortic valve stenosis. Aortic valve area, by VTI measures 1.57 cm. Aortic valve mean gradient measures 17.0 mmHg. Aortic valve Vmax measures 2.79 m/s.  9. The inferior vena cava is dilated in size with >50% respiratory variability, suggesting right atrial pressure of 8 mmHg. FINDINGS  Left Ventricle: Left ventricular ejection fraction, by estimation, is 60 to 65%. The left ventricle has normal function. The left ventricle has no regional wall motion abnormalities. The left ventricular internal cavity size was normal in size. There is  mild concentric left ventricular hypertrophy. Left ventricular diastolic parameters are indeterminate. Elevated left ventricular end-diastolic pressure. Right Ventricle: The right ventricular size is mildly enlarged. No increase in right ventricular wall thickness. Right ventricular systolic function is normal. There is severely elevated pulmonary artery  systolic pressure. The tricuspid regurgitant velocity is 4.02 m/s, and with an assumed right atrial pressure of 10 mmHg, the estimated right ventricular systolic pressure is 77.4 mmHg. Left Atrium: Left atrial size was severely dilated. Right Atrium: Right atrial size was severely dilated. Pericardium: IVC collapses. No RA or RV collapse. No signs of tamponade. A large pericardial effusion is present. The pericardial effusion is circumferential. Mitral Valve: The mitral valve is grossly normal. Mild mitral valve regurgitation. Tricuspid Valve: The tricuspid valve is grossly normal. Tricuspid valve regurgitation is moderate. Aortic Valve: The aortic valve is  tricuspid. Aortic valve regurgitation is mild. Aortic regurgitation PHT measures 540 msec. Mild aortic stenosis is present. Moderate aortic valve annular calcification. There is mild calcification of the aortic valve. Aortic valve mean gradient measures 17.0 mmHg. Aortic valve peak gradient measures 31.1 mmHg. Aortic valve area, by VTI measures 1.57 cm. Pulmonic Valve: The pulmonic valve was grossly normal. Pulmonic valve regurgitation is not visualized. Aorta: The aortic root is normal in size and structure. Venous: The inferior vena cava is dilated in size with greater than 50% respiratory variability, suggesting right atrial pressure of 8 mmHg. IAS/Shunts: No atrial level shunt detected by color flow Doppler.  LEFT VENTRICLE PLAX 2D LVIDd:         5.12 cm  Diastology LVIDs:         3.07 cm  LV e' lateral:   8.81 cm/s LV PW:         1.34 cm  LV E/e' lateral: 15.3 LV IVS:        1.15 cm  LV e' medial:    6.53 cm/s LVOT diam:     1.80 cm  LV E/e' medial:  20.7 LV SV:         91.86 ml LV SV Index:   41.66 LVOT Area:     2.54 cm  LEFT ATRIUM              Index       RIGHT ATRIUM           Index LA diam:        4.90 cm  2.46 cm/m  RA Area:     38.50 cm LA Vol (A2C):   186.0 ml 93.35 ml/m RA Volume:   166.00 ml 83.32 ml/m LA Vol (A4C):   160.0 ml 80.30 ml/m LA  Biplane Vol: 173.0 ml 86.83 ml/m  AORTIC VALVE AV Area (Vmax):    1.62 cm AV Area (Vmean):   1.49 cm AV Area (VTI):     1.57 cm AV Vmax:           278.67 cm/s AV Vmean:          192.333 cm/s AV VTI:            0.584 m AV Peak Grad:      31.1 mmHg AV Mean Grad:      17.0 mmHg LVOT Vmax:         177.33 cm/s LVOT Vmean:        112.667 cm/s LVOT VTI:          0.361 m LVOT/AV VTI ratio: 0.62 AI PHT:            540 msec  AORTA Ao Root diam: 2.80 cm MITRAL VALVE                TRICUSPID VALVE MV Area (PHT): 2.42 cm     TR Peak grad:   64.6 mmHg MV Decel Time: 313 msec     TR Vmax:        402.00 cm/s MR Peak grad: 63.0 mmHg MR Mean grad: 45.0 mmHg     SHUNTS MR Vmax:      397.00 cm/s   Systemic VTI:  0.36 m MR Vmean:     320.0 cm/s    Systemic Diam: 1.80 cm MV E velocity: 135.00 cm/s MV A velocity: 39.40 cm/s MV E/A ratio:  3.43 Prentice Docker MD Electronically signed by Prentice Docker MD Signature Date/Time: 05/20/2019/2:55:41 PM    Final  DG ESOPHAGUS W SINGLE CM (SOL OR THIN BA)  Result Date: 05/22/2019 CLINICAL DATA:  DYSPHAGIA. EXAM: ESOPHOGRAM/BARIUM SWALLOW TECHNIQUE: Single contrast examination was performed using thin barium or water soluble. FLUOROSCOPY TIME:  Fluoroscopy Time: 2 minutes 18 seconds Radiation Exposure Index (if provided by the fluoroscopic device): 38.6 mGy COMPARISON:  None. FINDINGS: The patient ingested thin barium in a semi upright position. The patient has poor esophageal peristalsis with persistent spasm in the distal esophagus consistent with achalasia. I placed the patient in an almost upright position and only a small amount of barium trickled into the nondistended stomach. IMPRESSION: Achalasia. Minimal emptying of the dilated esophagus. Minimal peristalsis in the esophagus. The patient tolerated the procedure well. No immediate complications. Electronically Signed   By: Francene Boyers M.D.   On: 05/22/2019 11:18    Impression: Very pleasant 86 year old female with  multiple issues including volume overload, actue hypoxic respiratory failure who has had years of dysphagia and GERD symptoms. Normally seen by [facility] in [place]. BPE was ordered for dysphagia which demonstrated achalasia. The patient states her dysphagia symptoms are not any worse, no previous or current regurgitation, N/V. Mild abdominal pain likely unrelated. Currently she is NPO.  At this time with her respiratory status and anticoagulation she would not be a candidate for EGD. I feel she looks worse on imaging than she is functionally. I think she would tolerate a liquid diet with appropriate precautions. If SLP swallow eval is still wanted, it could be done under these circumstances.  Plan: Continue PPI as inpatient and at d/c. Full liquid diet Sit upright minimum 30 mins after eating Monitor for post-prandial cough or regurgitation Supportive measures Will need rapid follow-up with [facility] at discharge (1-2 weeks)   Thank you for allowing [place] to participate in the care of Derica F Loma Messing, DNP, AGNP-C Adult & Gerontological Nurse Practitioner [facility]    LOS: 3 days     05/22/2019, 1:40 PM  Attending note:  Barium study reviewed.  Given the longstanding, chronic nature of her esophageal dysphagia symptoms, esophagram  findings may be indicative of achalasia.  However, an esophageal manometry will be required to make the diagnosis and an EGD will be needed to evaluate for other causes (ie pseudo-achalasia)   Agree with recommendations as outlined.

## 2019-05-22 NOTE — Care Management (Signed)
Authorization Request started for [facility] beginning today, 05/22/19 Spoke with Erie Noe at [place], faxing notes too [phone] ref # [id number]

## 2019-05-22 NOTE — Progress Notes (Signed)
SLP Cancellation Note  Patient Details Name: [name] MRN: [id number] DOB: [birth year]   Cancelled treatment:       Reason Eval/Treat Not Completed: Patient at procedure or test/unavailable. ST will re-attempt later today as schedule permits.  [name], CCC-SLP Speech Language Pathologist    [name] 05/22/2019, 10:32 AM

## 2019-05-22 NOTE — Progress Notes (Signed)
PROGRESS NOTE [facility]   [name]  OEU:[id number]  DOB: 23-Dec-1932  DOA: 05/19/2019 PCP: Raliegh Ip, DO  Brief Admission Hx: 86 year old female with A. fib on apixaban, GERD, hypertension, hypothyroidism, chronic lymphedema and chronic anemia, stage IIIb CKD presented with increasing lower extremity edema and shortness of breath with exertion progressive over the past 4 to 6 weeks.  Patient also recalled a 10 pound weight gain.  Patient was sent from PCP office with concerns for new findings of CHF.  MDM/Assessment & Plan:   1. Heart failure, preserved EF-patient responding well to IV diuresis with Lasix.  She is clinically starting to show some improvement.  Continue IV Lasix for diuresis.  Continue to monitor intake and output, weight and electrolytes.  She remains volume overloaded and will require further IV diuresis.  Elevate extremities.  TED hose as tolerated. 2. Acute hypoxic respiratory failure secondary to volume overload-improving with IV diuresis continue as noted above.    Wean oxygen as tolerated. 3. Permanent atrial fibrillation-rate is well controlled.  Continue apixaban for full anticoagulation. 4. Thrombocytopenia-following closely as patient is fully anticoagulated with apixaban.  CBC in a.m. 5. Large pericardial effusion - noted on echo, no tamponade physiology, spoke with cardiologist Dr. Purvis Sheffield, recommended repeating limited Echo on 05/23/19 to reassess.   6. Achalasia - noted on esophagram, GI saw her today and she is on full liquid diet and will need close follow up with [facility] in 1-2 weeks post discharge.  7. Stage 3b CKD-holding stable with IV diuresis, repeat in a.m. 8. Essential hypertension-patient's blood pressures are well controlled. 9. Hypothyroidism-stable on levothyroxine. 10. GERD-Protonix ordered for GI protection. 11. Chronic lymphedema-diuresis as noted above, lower extremity compression stockings as tolerated.  DVT  prophylaxis: Apixaban / TED Hose Code Status: Full Family Communication: Patient updated at bedside, verbalized understanding Disposition Plan: From home, continues to require IV diuresis with Lasix for symptom relief, follow-up limited 2D echocardiogram to reasses large pleural effusion on 05/23/19 and repeat electrolytes in a.m.  Consultants:    Procedures:  2D Echocardiogram 05/20/19 IMPRESSIONS 1. Left ventricular ejection fraction, by estimation, is 60 to 65%. The left ventricle has normal function. The left ventricle has no regional  wall motion abnormalities. There is mild concentric left ventricular hypertrophy. Left ventricular diastolic parameters are indeterminate. Elevated left ventricular end-diastolic pressure.  2. Right ventricular systolic function is normal. The right ventricular size is mildly enlarged. There is severely elevated pulmonary artery systolic pressure.  3. Left atrial size was severely dilated.  4. Right atrial size was severely dilated.  5. IVC collapses. No RA or RV collapse. No signs of tamponade. Large pericardial effusion. The pericardial effusion is circumferential.  6. The mitral valve is grossly normal. Mild mitral valve regurgitation.  7. Tricuspid valve regurgitation is moderate.  8. The aortic valve is tricuspid. Aortic valve regurgitation is mild. Mild aortic valve stenosis. Aortic valve area, by VTI measures 1.57 cm. Aortic valve mean gradient measures 17.0 mmHg. Aortic valve Vmax measures 2.79 m/s.  9. The inferior vena cava is dilated in size with >50% respiratory variability, suggesting right atrial pressure of 8 mmHg.   Antimicrobials:     Subjective: Pt remains weak and having SOB.    Objective: Vitals:   05/21/19 2004 05/21/19 2126 05/22/19 0642 05/22/19 1348  BP:  114/64 115/61 (!) 97/45  Pulse:  82 82 62  Resp:  18 20 16   Temp:  99.4 F (37.4 C) 98.1 F (36.7 C)  97.6 F (36.4 C)  TempSrc:  Oral Oral Oral  SpO2: 95%  96% 91% 95%  Weight:      Height:        Intake/Output Summary (Last 24 hours) at 05/22/2019 1821 Last data filed at 05/22/2019 1200 Gross per 24 hour  Intake 143 ml  Output --  Net 143 ml   Filed Weights   05/19/19 1131 05/19/19 1553 05/20/19 0500  Weight: 95 kg 92.7 kg 94.7 kg   REVIEW OF SYSTEMS  As per history otherwise all reviewed and reported negative  Exam:  General exam: Awake, alert, no apparent distress, cooperative.  Elderly female. Respiratory system: rare bibasilar crackles.  No increased work of breathing. Cardiovascular system: Normal S1 & S2 heard. No JVD, murmurs, gallops, clicks.  1+ pitting pedal edema. Gastrointestinal system: Abdomen is nondistended, soft and nontender. Normal bowel sounds heard. Central nervous system: Alert and oriented. No focal neurological deficits. Extremities: 2+ pitting edema bilateral lower extremities. Chronic LE lymphedema.   Data Reviewed: Basic Metabolic Panel: Recent Labs  Lab 05/19/19 1200 05/20/19 0433 05/21/19 0419 05/22/19 0729  NA 143 145 143 142  K 4.5 4.4 4.2 4.4  CL 99 99 96* 95*  CO2 35* 35* 37* 37*  GLUCOSE 113* 80 92 107*  BUN 33* 31* 31* 30*  CREATININE 1.49* 1.38* 1.35* 1.29*  CALCIUM 9.2 9.0 9.0 9.1  MG  --  2.2 2.3 2.3   Liver Function Tests: Recent Labs  Lab 05/19/19 1200  AST 28  ALT 15  ALKPHOS 104  BILITOT 1.4*  PROT 7.1  ALBUMIN 3.8   No results for input(s): LIPASE, AMYLASE in the last 168 hours. No results for input(s): AMMONIA in the last 168 hours. CBC: Recent Labs  Lab 05/19/19 1200 05/20/19 0433 05/21/19 0419 05/22/19 0729  WBC 4.1 3.6* 4.3 4.6  HGB 11.8* 11.5* 11.1* 11.5*  HCT 39.9 38.3 38.3 39.0  MCV 103.9* 105.5* 107.0* 107.4*  PLT 94* 87* 90* 88*   Cardiac Enzymes: No results for input(s): CKTOTAL, CKMB, CKMBINDEX, TROPONINI in the last 168 hours. CBG (last 3)  No results for input(s): GLUCAP in the last 72 hours. Recent Results (from the past 240 hour(s))   Respiratory Panel by RT PCR (Flu A&B, Covid) - Nasopharyngeal Swab     Status: None   Collection Time: 05/19/19 12:27 PM   Specimen: Nasopharyngeal Swab  Result Value Ref Range Status   SARS Coronavirus 2 by RT PCR NEGATIVE NEGATIVE Final    Comment: (NOTE) SARS-CoV-2 target nucleic acids are NOT DETECTED. The SARS-CoV-2 RNA is generally detectable in upper respiratoy specimens during the acute phase of infection. The lowest concentration of SARS-CoV-2 viral copies this assay can detect is 131 copies/mL. A negative result does not preclude SARS-Cov-2 infection and should not be used as the sole basis for treatment or other patient management decisions. A negative result may occur with  improper specimen collection/handling, submission of specimen other than nasopharyngeal swab, presence of viral mutation(s) within the areas targeted by this assay, and inadequate number of viral copies (<131 copies/mL). A negative result must be combined with clinical observations, patient history, and epidemiological information. The expected result is Negative. Fact Sheet for Patients:  https://www.moore.com/ Fact Sheet for Healthcare Providers:  https://www.young.biz/ This test is not yet ap proved or cleared by the [place] FDA and  has been authorized for detection and/or diagnosis of SARS-CoV-2 by FDA under an Emergency Use Authorization (EUA). This EUA will remain  in effect (  meaning this test can be used) for the duration of the COVID-19 declaration under Section 564(b)(1) of the Act, 21 U.S.C. section 360bbb-3(b)(1), unless the authorization is terminated or revoked sooner.    Influenza A by PCR NEGATIVE NEGATIVE Final   Influenza B by PCR NEGATIVE NEGATIVE Final    Comment: (NOTE) The Xpert Xpress SARS-CoV-2/FLU/RSV assay is intended as an aid in  the diagnosis of influenza from Nasopharyngeal swab specimens and  should not be used as a sole  basis for treatment. Nasal washings and  aspirates are unacceptable for Xpert Xpress SARS-CoV-2/FLU/RSV  testing. Fact Sheet for Patients: PinkCheek.be Fact Sheet for Healthcare Providers: GravelBags.it This test is not yet approved or cleared by the [place] FDA and  has been authorized for detection and/or diagnosis of SARS-CoV-2 by  FDA under an Emergency Use Authorization (EUA). This EUA will remain  in effect (meaning this test can be used) for the duration of the  Covid-19 declaration under Section 564(b)(1) of the Act, 21  U.S.C. section 360bbb-3(b)(1), unless the authorization is  terminated or revoked. Performed at [facility], [address], [place], [place] [address]      Studies: DG ESOPHAGUS W SINGLE CM (SOL OR THIN BA)  Result Date: 05/22/2019 CLINICAL DATA:  DYSPHAGIA. EXAM: ESOPHOGRAM/BARIUM SWALLOW TECHNIQUE: Single contrast examination was performed using thin barium or water soluble. FLUOROSCOPY TIME:  Fluoroscopy Time: 2 minutes 18 seconds Radiation Exposure Index (if provided by the fluoroscopic device): 38.6 mGy COMPARISON:  None. FINDINGS: The patient ingested thin barium in a semi upright position. The patient has poor esophageal peristalsis with persistent spasm in the distal esophagus consistent with achalasia. I placed the patient in an almost upright position and only a small amount of barium trickled into the nondistended stomach. IMPRESSION: Achalasia. Minimal emptying of the dilated esophagus. Minimal peristalsis in the esophagus. The patient tolerated the procedure well. No immediate complications. Electronically Signed   By: Lorriane Shire M.D.   On: 05/22/2019 11:18   Scheduled Meds: . apixaban  5 mg Oral BID  . calcium-vitamin D  1 tablet Oral Q breakfast  . cholecalciferol  2,000 Units Oral Daily  . furosemide  40 mg Intravenous Q12H  . iron polysaccharides  150 mg Oral Daily  .  levothyroxine  137 mcg Oral Q0600  . multivitamin with minerals  1 tablet Oral Daily  . pantoprazole  40 mg Oral Daily  . potassium chloride  10 mEq Oral Daily  . sodium chloride flush  3 mL Intravenous Q12H   Continuous Infusions: . sodium chloride     Principal Problem:   Acute hypoxemic respiratory failure (HCC) Active Problems:   Hypothyroidism   GERD (gastroesophageal reflux disease)   Hyperlipidemia   AF (atrial fibrillation) (HCC)   Vitamin D deficiency   Metabolic syndrome   Venous stasis of both lower extremities   Acute congestive heart failure (HCC)   Volume overload   Dysphagia   Achalasia  Time spent:   Irwin Brakeman, MD [facility] 05/22/2019, 6:21 PM    LOS: 3 days  How to contact the [facility] Attending or Consulting provider [place] or covering provider during after hours [place], for this patient?  1. Check the care team in [facility] and look for a) attending/consulting [facility] provider listed and b) the [facility] team listed 2. Log into [email or web address] and use [place]'s universal password to access. If you do not have the password, please contact the hospital operator. 3. Locate the [facility]  provider you are looking for under [facility] and page to a number that you can be directly reached. 4. If you still have difficulty reaching the provider, please page the [facility] (Director on Call) for the Hospitalists listed on amion for assistance.

## 2019-05-23 ENCOUNTER — Inpatient Hospital Stay (HOSPITAL_COMMUNITY): Payer: Medicare Other

## 2019-05-23 DIAGNOSIS — I313 Pericardial effusion (noninflammatory): Secondary | ICD-10-CM

## 2019-05-23 LAB — ECHOCARDIOGRAM LIMITED
Height: 64 in
Weight: 3121.71 oz

## 2019-05-23 LAB — CBC
HCT: 40.7 % (ref 36.0–46.0)
Hemoglobin: 11.6 g/dL — ABNORMAL LOW (ref 12.0–15.0)
MCH: 30.9 pg (ref 26.0–34.0)
MCHC: 28.5 g/dL — ABNORMAL LOW (ref 30.0–36.0)
MCV: 108.2 fL — ABNORMAL HIGH (ref 80.0–100.0)
Platelets: 84 10*3/uL — ABNORMAL LOW (ref 150–400)
RBC: 3.76 MIL/uL — ABNORMAL LOW (ref 3.87–5.11)
RDW: 16.1 % — ABNORMAL HIGH (ref 11.5–15.5)
WBC: 4.1 10*3/uL (ref 4.0–10.5)
nRBC: 0 % (ref 0.0–0.2)

## 2019-05-23 LAB — BASIC METABOLIC PANEL
Anion gap: 10 (ref 5–15)
BUN: 38 mg/dL — ABNORMAL HIGH (ref 8–23)
CO2: 36 mmol/L — ABNORMAL HIGH (ref 22–32)
Calcium: 9.2 mg/dL (ref 8.9–10.3)
Chloride: 96 mmol/L — ABNORMAL LOW (ref 98–111)
Creatinine, Ser: 1.46 mg/dL — ABNORMAL HIGH (ref 0.44–1.00)
GFR calc Af Amer: 38 mL/min — ABNORMAL LOW (ref >60)
GFR calc non Af Amer: 33 mL/min — ABNORMAL LOW (ref >60)
Glucose, Bld: 90 mg/dL (ref 70–99)
Potassium: 4.7 mmol/L (ref 3.5–5.1)
Sodium: 142 mmol/L (ref 135–145)

## 2019-05-23 LAB — MAGNESIUM: Magnesium: 2.3 mg/dL (ref 1.7–2.4)

## 2019-05-23 MED ORDER — FUROSEMIDE 10 MG/ML IJ SOLN
40.0000 mg | Freq: Every day | INTRAMUSCULAR | Status: DC
  Administered 2019-05-24: 40 mg via INTRAVENOUS
  Filled 2019-05-23: qty 4

## 2019-05-23 NOTE — Progress Notes (Signed)
PROGRESS NOTE [facility]   KATELIN KUTSCH  JXB:[id number]  DOB: [birth year]  DOA: 05/19/2019 PCP: Raliegh Ip, DO  Brief Admission Hx: 86 year old female with A. fib on apixaban, GERD, hypertension, hypothyroidism, chronic lymphedema and chronic anemia, stage IIIb CKD presented with increasing lower extremity edema and shortness of breath with exertion progressive over the past 4 to 6 weeks.  Patient also recalled a 10 pound weight gain.  Patient was sent from PCP office with concerns for new findings of CHF.  MDM/Assessment & Plan:   1. Heart failure, preserved EF-patient responding well to IV diuresis with Lasix.  She is clinically starting to show some improvement.  Continue IV Lasix for diuresis reduced dose to once daily.  Continue to monitor intake and output, weight and electrolytes.  She remains volume overloaded and will require further IV diuresis.  Elevate extremities.  TED hose as tolerated. 2. Acute hypoxic respiratory failure secondary to volume overload-improving with IV diuresis continue as noted above.    Wean oxygen as tolerated. 3. Permanent atrial fibrillation-rate is well controlled.  Continue apixaban for full anticoagulation. 4. Thrombocytopenia-following closely as patient is fully anticoagulated with apixaban.  Has been stable.  5. Large pericardial effusion - noted on echo, no tamponade physiology, spoke with cardiologist Dr. Purvis Sheffield, recommended repeating limited Echo on 05/23/19 to reassess and it appears to be stable and possibly chronic.   6. Achalasia - noted on esophagram, GI saw her and she is on full liquid diet and will need close follow up with [facility] in 1-2 weeks post discharge.  7. Stage 3b CKD-holding stable with IV diuresis, repeat in a.m. 8. Essential hypertension-patient's blood pressures are well controlled. 9. Hypothyroidism-stable on levothyroxine. 10. GERD-Protonix ordered for GI protection. 11. Chronic lymphedema-diuresis as  noted above, lower extremity compression stockings as tolerated.  DVT prophylaxis: Apixaban / TED Hose Code Status: Full Family Communication: Patient updated at bedside, verbalized understanding Disposition Plan: From home, continues to require IV diuresis with Lasix for symptom relief, and repeat electrolytes in a.m.  Consultants:    Procedures:  2D Echocardiogram 05/20/19 IMPRESSIONS 1. Left ventricular ejection fraction, by estimation, is 60 to 65%. The left ventricle has normal function. The left ventricle has no regional  wall motion abnormalities. There is mild concentric left ventricular hypertrophy. Left ventricular diastolic parameters are indeterminate. Elevated left ventricular end-diastolic pressure.  2. Right ventricular systolic function is normal. The right ventricular size is mildly enlarged. There is severely elevated pulmonary artery systolic pressure.  3. Left atrial size was severely dilated.  4. Right atrial size was severely dilated.  5. IVC collapses. No RA or RV collapse. No signs of tamponade. Large pericardial effusion. The pericardial effusion is circumferential.  6. The mitral valve is grossly normal. Mild mitral valve regurgitation.  7. Tricuspid valve regurgitation is moderate.  8. The aortic valve is tricuspid. Aortic valve regurgitation is mild. Mild aortic valve stenosis. Aortic valve area, by VTI measures 1.57 cm. Aortic valve mean gradient measures 17.0 mmHg. Aortic valve Vmax measures 2.79 m/s.  9. The inferior vena cava is dilated in size with >50% respiratory variability, suggesting right atrial pressure of 8 mmHg.   Repeat Limited Echo 05/23/19 IMPRESSIONS  1. Limited study.  2. Large pericardial effusion, circumferential but to greater degree posterior and lateral. Dimensions are similar to previous study on February 17. No obvious significant respiratory variation in mitral outflow to suggest tamponade physiology. There is  no  compression of RA or RV either,  but evidence of significant pulmonary hypertension (last study RVSP was approximately 75 mmHg) and this could be inhibiting compression of RV chamber. Organizing material adherent to pericardium suggests possible chronicity, particularly given effusion size.  3. Left ventricular ejection fraction, by estimation, is 60 to 65%. The left ventricle has normal function. The left ventricle has no regional wall motion abnormalities. There is the interventricular septum is flattened in systole and diastole, consistent with right ventricular pressure and volume overload.  4. The right ventricular size is moderately enlarged.  5. Left atrial size was severely dilated.  6. Right atrial size was severely dilated.  7. The mitral valve is grossly normal.   Antimicrobials:     Subjective: Pt without any complaints.  She says she feels tired, she didn't sleep very well    Objective: Vitals:   05/23/19 0534 05/23/19 0900 05/23/19 1100 05/23/19 1500  BP: 129/70 140/83  129/71  Pulse: 78 82 75 76  Resp: 18 (!) 27 20 (!) 24  Temp: 98 F (36.7 C) 97.9 F (36.6 C)  (!) 97.5 F (36.4 C)  TempSrc: Axillary Oral  Axillary  SpO2: 95% 96% 94% 93%  Weight: 88.5 kg     Height:        Intake/Output Summary (Last 24 hours) at 05/23/2019 1732 Last data filed at 05/23/2019 0700 Gross per 24 hour  Intake 123 ml  Output 600 ml  Net -477 ml   Filed Weights   05/19/19 1553 05/20/19 0500 05/23/19 0534  Weight: 92.7 kg 94.7 kg 88.5 kg   REVIEW OF SYSTEMS  As per history otherwise all reviewed and reported negative  Exam:  General exam: Awake, alert, no apparent distress, cooperative.  Elderly female. Respiratory system: rare bibasilar crackles.  No increased work of breathing. Cardiovascular system: Normal S1 & S2 heard. No JVD, murmurs, gallops, clicks.  1+ pitting pedal edema. Gastrointestinal system: Abdomen is nondistended, soft and nontender. Normal bowel sounds  heard. Central nervous system: Alert and oriented. No focal neurological deficits. Extremities: 2+ pitting edema bilateral lower extremities. Chronic LE lymphedema.   Data Reviewed: Basic Metabolic Panel: Recent Labs  Lab 05/19/19 1200 05/20/19 0433 05/21/19 0419 05/22/19 0729 05/23/19 0624  NA 143 145 143 142 142  K 4.5 4.4 4.2 4.4 4.7  CL 99 99 96* 95* 96*  CO2 35* 35* 37* 37* 36*  GLUCOSE 113* 80 92 107* 90  BUN 33* 31* 31* 30* 38*  CREATININE 1.49* 1.38* 1.35* 1.29* 1.46*  CALCIUM 9.2 9.0 9.0 9.1 9.2  MG  --  2.2 2.3 2.3 2.3   Liver Function Tests: Recent Labs  Lab 05/19/19 1200  AST 28  ALT 15  ALKPHOS 104  BILITOT 1.4*  PROT 7.1  ALBUMIN 3.8   No results for input(s): LIPASE, AMYLASE in the last 168 hours. No results for input(s): AMMONIA in the last 168 hours. CBC: Recent Labs  Lab 05/19/19 1200 05/20/19 0433 05/21/19 0419 05/22/19 0729 05/23/19 0624  WBC 4.1 3.6* 4.3 4.6 4.1  HGB 11.8* 11.5* 11.1* 11.5* 11.6*  HCT 39.9 38.3 38.3 39.0 40.7  MCV 103.9* 105.5* 107.0* 107.4* 108.2*  PLT 94* 87* 90* 88* 84*   Cardiac Enzymes: No results for input(s): CKTOTAL, CKMB, CKMBINDEX, TROPONINI in the last 168 hours. CBG (last 3)  No results for input(s): GLUCAP in the last 72 hours. Recent Results (from the past 240 hour(s))  Respiratory Panel by RT PCR (Flu A&B, Covid) - Nasopharyngeal Swab     Status: None  Collection Time: 05/19/19 12:27 PM   Specimen: Nasopharyngeal Swab  Result Value Ref Range Status   SARS Coronavirus 2 by RT PCR NEGATIVE NEGATIVE Final    Comment: (NOTE) SARS-CoV-2 target nucleic acids are NOT DETECTED. The SARS-CoV-2 RNA is generally detectable in upper respiratoy specimens during the acute phase of infection. The lowest concentration of SARS-CoV-2 viral copies this assay can detect is 131 copies/mL. A negative result does not preclude SARS-Cov-2 infection and should not be used as the sole basis for treatment or other patient  management decisions. A negative result may occur with  improper specimen collection/handling, submission of specimen other than nasopharyngeal swab, presence of viral mutation(s) within the areas targeted by this assay, and inadequate number of viral copies (<131 copies/mL). A negative result must be combined with clinical observations, patient history, and epidemiological information. The expected result is Negative. Fact Sheet for Patients:  https://www.moore.com/ Fact Sheet for Healthcare Providers:  https://www.young.biz/ This test is not yet ap proved or cleared by the [place] FDA and  has been authorized for detection and/or diagnosis of SARS-CoV-2 by FDA under an Emergency Use Authorization (EUA). This EUA will remain  in effect (meaning this test can be used) for the duration of the COVID-19 declaration under Section 564(b)(1) of the Act, 21 U.S.C. section 360bbb-3(b)(1), unless the authorization is terminated or revoked sooner.    Influenza A by PCR NEGATIVE NEGATIVE Final   Influenza B by PCR NEGATIVE NEGATIVE Final    Comment: (NOTE) The Xpert Xpress SARS-CoV-2/FLU/RSV assay is intended as an aid in  the diagnosis of influenza from Nasopharyngeal swab specimens and  should not be used as a sole basis for treatment. Nasal washings and  aspirates are unacceptable for Xpert Xpress SARS-CoV-2/FLU/RSV  testing. Fact Sheet for Patients: https://www.moore.com/ Fact Sheet for Healthcare Providers: https://www.young.biz/ This test is not yet approved or cleared by the [place] FDA and  has been authorized for detection and/or diagnosis of SARS-CoV-2 by  FDA under an Emergency Use Authorization (EUA). This EUA will remain  in effect (meaning this test can be used) for the duration of the  Covid-19 declaration under Section 564(b)(1) of the Act, 21  U.S.C. section 360bbb-3(b)(1), unless the  authorization is  terminated or revoked. Performed at [facility], [address], [place], [place] [address]      Studies: ECHOCARDIOGRAM LIMITED  Result Date: 05/23/2019    ECHOCARDIOGRAM LIMITED REPORT   Patient Name:   TANNA LOEFFLER Date of Exam: 05/23/2019 Medical Rec #:  [id number]      Height:       64.0 in Accession #:    [id number]     Weight:       195.1 lb Date of Birth:  February 01, 1933      BSA:          1.936 m Patient Age:    86 years       BP:           129/70 mmHg Patient Gender: F              HR:           78 bpm. [facility] Procedure: Limited Echo and Cardiac Doppler Indications:    Pericardial Effusion 423.9 / l31.3  History:        Patient has prior history of Echocardiogram examinations, most                 recent 05/20/2019. CHF,  Arrythmias:Atrial Fibrillation; Risk                 Factors:Hypertension and Dyslipidemia. Non-Smoker. Acute                 hypoxemic respiratory failure , First degree atrioventricular                 block, GERD, Non-rheumatic mitral regurgitation.  Sonographer:    Alvino Chapel RCS Referring Phys: [place]  1. Limited study.  2. Large pericardial effusion, circumferential but to greater degree posterior and lateral. Dimensions are similar to previous study on February 17. No obvious significant respiratory variation in mitral outflow to suggest tamponade physiology. There is  no compression of RA or RV either, but evidence of significant pulmonary hypertension (last study RVSP was approximately 75 mmHg) and this could be inhibiting compression of RV chamber. Organizing material adherent to pericardium suggests possible chronicity, particularly given effusion size.  3. Left ventricular ejection fraction, by estimation, is 60 to 65%. The left ventricle has normal function. The left ventricle has no regional wall motion abnormalities. There is the interventricular septum is flattened in systole and diastole, consistent  with right ventricular pressure and volume overload.  4. The right ventricular size is moderately enlarged.  5. Left atrial size was severely dilated.  6. Right atrial size was severely dilated.  7. The mitral valve is grossly normal. FINDINGS  Left Ventricle: Left ventricular ejection fraction, by estimation, is 60 to 65%. The left ventricle has normal function. The left ventricle has no regional wall motion abnormalities. The interventricular septum is flattened in systole and diastole, consistent with right ventricular pressure and volume overload. Right Ventricle: The right ventricular size is moderately enlarged. Left Atrium: Left atrial size was severely dilated. Right Atrium: Right atrial size was severely dilated. Pericardium: Dimensions are similar to previous study on February 17. No obvious significant respiratory variation in mitral outflow to suggest tamponade physiology. There is no compression of RA or RV either, but evidence of significant pulmonary hypertension (last study RVSP was approximately 75 mmHg) and this could be inhibiting compression of RV chamber. Organizing material adherent to pericardium suggests possible chronicity, particularly given effusion size. A large pericardial effusion is present. The pericardial effusion is circumferential. Mitral Valve: The mitral valve is grossly normal. Mild to moderate mitral annular calcification. Additional Comments: There is pleural effusion in the left lateral region. Rozann Lesches MD Electronically signed by Rozann Lesches MD Signature Date/Time: 05/23/2019/2:09:38 PM    Final    DG ESOPHAGUS W SINGLE CM (SOL OR THIN BA)  Result Date: 05/22/2019 CLINICAL DATA:  DYSPHAGIA. EXAM: ESOPHOGRAM/BARIUM SWALLOW TECHNIQUE: Single contrast examination was performed using thin barium or water soluble. FLUOROSCOPY TIME:  Fluoroscopy Time: 2 minutes 18 seconds Radiation Exposure Index (if provided by the fluoroscopic device): 38.6 mGy COMPARISON:  None.  FINDINGS: The patient ingested thin barium in a semi upright position. The patient has poor esophageal peristalsis with persistent spasm in the distal esophagus consistent with achalasia. I placed the patient in an almost upright position and only a small amount of barium trickled into the nondistended stomach. IMPRESSION: Achalasia. Minimal emptying of the dilated esophagus. Minimal peristalsis in the esophagus. The patient tolerated the procedure well. No immediate complications. Electronically Signed   By: Lorriane Shire M.D.   On: 05/22/2019 11:18   Scheduled Meds: . apixaban  5 mg Oral BID  . calcium-vitamin D  1 tablet Oral Q breakfast  .  cholecalciferol  2,000 Units Oral Daily  . [START ON 05/24/2019] furosemide  40 mg Intravenous Daily  . iron polysaccharides  150 mg Oral Daily  . levothyroxine  137 mcg Oral Q0600  . multivitamin with minerals  1 tablet Oral Daily  . pantoprazole  40 mg Oral Daily  . potassium chloride  10 mEq Oral Daily  . sodium chloride flush  3 mL Intravenous Q12H   Continuous Infusions: . sodium chloride     Principal Problem:   Acute hypoxemic respiratory failure (HCC) Active Problems:   Hypothyroidism   GERD (gastroesophageal reflux disease)   Hyperlipidemia   AF (atrial fibrillation) (HCC)   Vitamin D deficiency   Metabolic syndrome   Venous stasis of both lower extremities   Acute congestive heart failure (HCC)   Volume overload   Dysphagia   Achalasia  Time spent:   Standley Dakins, MD [facility] 05/23/2019, 5:32 PM    LOS: 4 days  How to contact the [facility] Attending or Consulting provider 7A - 7P or covering provider during after hours 7P -7A, for this patient?  1. Check the care team in [facility] and look for a) attending/consulting [facility] provider listed and b) the [facility] team listed 2. Log into [email or web address] and use [facility]'s universal password to access. If you do not have the password, please contact the hospital operator. 3. Locate the  [facility] provider you are looking for under [facility] and page to a number that you can be directly reached. 4. If you still have difficulty reaching the provider, please page the [facility] (Director on Call) for the Hospitalists listed on amion for assistance.

## 2019-05-23 NOTE — Progress Notes (Signed)
Pt is A&Ox3. Disoriented to situation. Pt found on 4L [place] at 86%. Pt placed on 5 [place]. Sat at 92% at this time. Will continue to monitor pt.

## 2019-05-23 NOTE — Progress Notes (Signed)
*  PRELIMINARY RESULTS* Echocardiogram Limited 2-D Echocardiogram has been performed.  Stacey Drain 05/23/2019, 10:59 AM

## 2019-05-24 ENCOUNTER — Inpatient Hospital Stay (HOSPITAL_COMMUNITY): Payer: Medicare Other

## 2019-05-24 MED ORDER — IPRATROPIUM-ALBUTEROL 0.5-2.5 (3) MG/3ML IN SOLN
3.0000 mL | Freq: Four times a day (QID) | RESPIRATORY_TRACT | Status: DC
  Administered 2019-05-24 – 2019-05-29 (×21): 3 mL via RESPIRATORY_TRACT
  Filled 2019-05-24 (×21): qty 3

## 2019-05-24 MED ORDER — CHLORHEXIDINE GLUCONATE CLOTH 2 % EX PADS
6.0000 | MEDICATED_PAD | Freq: Every day | CUTANEOUS | Status: DC
  Administered 2019-05-24 – 2019-06-01 (×9): 6 via TOPICAL

## 2019-05-24 MED ORDER — IPRATROPIUM BROMIDE 0.02 % IN SOLN
RESPIRATORY_TRACT | Status: AC
  Administered 2019-05-24: 09:00:00 0.5 mg
  Filled 2019-05-24: qty 2.5

## 2019-05-24 NOTE — Progress Notes (Signed)
This nurse entered room at 0815 to find patient in mild distress, labored breathing with use of accessory muscles. Lips purple, face pale. O2 sat 59% on 4L nasal cannula, resp 27, tele monitor 106 afib. Patient oriented to self and able to follow simple commands. NRB applied and sats increased to 95%. Dr Laural Benes paged at [phone] and came to assess patient. Lungs diminished throughout. Patient became more alert. Orders placed. IV Lasix given as ordered. Bladder scanned patient at 1005 to reveal approx. urine. Dr Laural Benes made aware, order placed to insert indwelling catheter. Process explained to patient, nodded head in understanding. Cloudy urine with sediment returned. CXR ordered. Patient remains on NRB at this time, DNR. Will continue to monitor.

## 2019-05-24 NOTE — Progress Notes (Signed)
Bladder scanned pt and showed greater than 407. In and Out cath performed and received of clear yellow urine. Pt tolerated In and Out well. I will continue to monitor pt.

## 2019-05-24 NOTE — Progress Notes (Addendum)
PROGRESS NOTE [facility]   [name]  HER:[id number]  DOB: March 28, 1933  DOA: 05/19/2019 PCP: Raliegh Ip, DO  Brief Admission Hx: 86 year old female with A. fib on apixaban, GERD, hypertension, hypothyroidism, chronic lymphedema and chronic anemia, stage IIIb CKD presented with increasing lower extremity edema and shortness of breath with exertion progressive over the past 4 to 6 weeks.  Patient also recalled a 10 pound weight gain.  Patient was sent from PCP office with concerns for new findings of CHF.  MDM/Assessment & Plan:   1. Heart failure, preserved EF-patient responding well to IV diuresis with Lasix.  She is clinically starting to show some improvement.  Continue IV Lasix for diuresis reduced dose to once daily.  Continue to monitor intake and output, weight and electrolytes.  She remains volume overloaded and will require further IV diuresis.  Elevate extremities.  TED hose as tolerated. 2. Acute hypoxic respiratory failure secondary to volume overload-improving with IV diuresis continue as noted above.    Wean oxygen as tolerated. 3. Moderate bilateral pleural effusions - discussed with son, he is leaning towards thoracentesis to help with symptoms.   4. Permanent atrial fibrillation-rate is well controlled.  Continue apixaban for full anticoagulation. 5. Thrombocytopenia-following closely as patient is fully anticoagulated with apixaban.  Has been stable.  6. Urinary retention - we have tried I/O for 24 hours and continues to have urinary retention, foley placed as she is being diuresed with IV lasix.   7. Large pericardial effusion - noted on echo, no tamponade physiology, spoke with cardiologist Dr. Purvis Sheffield, recommended repeating limited Echo on 05/23/19 to reassess and it appears to be stable and possibly chronic.   8. AMS - CT head without contrast.   9. Achalasia - noted on esophagram, GI saw her and she is on full liquid diet and will need close follow up  with [facility] in 1-2 weeks post discharge.  10. Stage 3b CKD-holding stable with IV diuresis, repeat in a.m. 11. Essential hypertension-patient's blood pressures are well controlled. 12. Hypothyroidism-stable on levothyroxine. 13. GERD-Protonix ordered for GI protection. 14. Chronic lymphedema-diuresis as noted above, lower extremity compression stockings as tolerated.  DVT prophylaxis: Apixaban / TED Hose Code Status: Full Family Communication: Patient updated at bedside, verbalized understanding Disposition Plan: From home, continues to require IV diuresis with Lasix for symptom relief, and repeat electrolytes in a.m.  Consultants:    Procedures:  2D Echocardiogram 05/20/19 IMPRESSIONS 1. Left ventricular ejection fraction, by estimation, is 60 to 65%. The left ventricle has normal function. The left ventricle has no regional  wall motion abnormalities. There is mild concentric left ventricular hypertrophy. Left ventricular diastolic parameters are indeterminate. Elevated left ventricular end-diastolic pressure.  2. Right ventricular systolic function is normal. The right ventricular size is mildly enlarged. There is severely elevated pulmonary artery systolic pressure.  3. Left atrial size was severely dilated.  4. Right atrial size was severely dilated.  5. IVC collapses. No RA or RV collapse. No signs of tamponade. Large pericardial effusion. The pericardial effusion is circumferential.  6. The mitral valve is grossly normal. Mild mitral valve regurgitation.  7. Tricuspid valve regurgitation is moderate.  8. The aortic valve is tricuspid. Aortic valve regurgitation is mild. Mild aortic valve stenosis. Aortic valve area, by VTI measures 1.57 cm. Aortic valve mean gradient measures 17.0 mmHg. Aortic valve Vmax measures 2.79 m/s.  9. The inferior vena cava is dilated in size with >50% respiratory variability, suggesting right atrial pressure of  8 mmHg.   Repeat Limited Echo  05/23/19 IMPRESSIONS  1. Limited study.  2. Large pericardial effusion, circumferential but to greater degree posterior and lateral. Dimensions are similar to previous study on February 17. No obvious significant respiratory variation in mitral outflow to suggest tamponade physiology. There is  no compression of RA or RV either, but evidence of significant pulmonary hypertension (last study RVSP was approximately 75 mmHg) and this could be inhibiting compression of RV chamber. Organizing material adherent to pericardium suggests possible chronicity, particularly given effusion size.  3. Left ventricular ejection fraction, by estimation, is 60 to 65%. The left ventricle has normal function. The left ventricle has no regional wall motion abnormalities. There is the interventricular septum is flattened in systole and diastole, consistent with right ventricular pressure and volume overload.  4. The right ventricular size is moderately enlarged.  5. Left atrial size was severely dilated.  6. Right atrial size was severely dilated.  7. The mitral valve is grossly normal.   Antimicrobials:     Subjective: Pt had episode of acute respiratory distress early this morning.   Objective: Vitals:   05/24/19 0818 05/24/19 0835 05/24/19 1403 05/24/19 1423  BP:   133/88   Pulse:   89   Resp: (!) 22  20   Temp: 98.7 F (37.1 C)     TempSrc: Axillary     SpO2: 95% 98% 100% 100%  Weight:      Height:        Intake/Output Summary (Last 24 hours) at 05/24/2019 1543 Last data filed at 05/24/2019 0100 Gross per 24 hour  Intake 20 ml  Output 500 ml  Net -480 ml   Filed Weights   05/20/19 0500 05/23/19 0534 05/24/19 0500  Weight: 94.7 kg 88.5 kg 87.3 kg   REVIEW OF SYSTEMS  As per history otherwise all reviewed and reported negative  Exam:  General exam: Awake, somnolent, no apparent distress, cooperative.  Elderly female. Respiratory system: rare bibasilar crackles.  Poor air movement.   No increased work of breathing. Cardiovascular system: Normal S1 & S2 heard. No JVD, murmurs, gallops, clicks.  1+ pitting pedal edema. Gastrointestinal system: Abdomen is nondistended, soft and nontender. Normal bowel sounds heard. Central nervous system: Alert and oriented. No focal neurological deficits. Extremities: 2+ pitting edema bilateral lower extremities. Chronic LE lymphedema.   Data Reviewed: Basic Metabolic Panel: Recent Labs  Lab 05/19/19 1200 05/20/19 0433 05/21/19 0419 05/22/19 0729 05/23/19 0624  NA 143 145 143 142 142  K 4.5 4.4 4.2 4.4 4.7  CL 99 99 96* 95* 96*  CO2 35* 35* 37* 37* 36*  GLUCOSE 113* 80 92 107* 90  BUN 33* 31* 31* 30* 38*  CREATININE 1.49* 1.38* 1.35* 1.29* 1.46*  CALCIUM 9.2 9.0 9.0 9.1 9.2  MG  --  2.2 2.3 2.3 2.3   Liver Function Tests: Recent Labs  Lab 05/19/19 1200  AST 28  ALT 15  ALKPHOS 104  BILITOT 1.4*  PROT 7.1  ALBUMIN 3.8   No results for input(s): LIPASE, AMYLASE in the last 168 hours. No results for input(s): AMMONIA in the last 168 hours. CBC: Recent Labs  Lab 05/19/19 1200 05/20/19 0433 05/21/19 0419 05/22/19 0729 05/23/19 0624  WBC 4.1 3.6* 4.3 4.6 4.1  HGB 11.8* 11.5* 11.1* 11.5* 11.6*  HCT 39.9 38.3 38.3 39.0 40.7  MCV 103.9* 105.5* 107.0* 107.4* 108.2*  PLT 94* 87* 90* 88* 84*   Cardiac Enzymes: No results for input(s): CKTOTAL, CKMB,  CKMBINDEX, TROPONINI in the last 168 hours. CBG (last 3)  No results for input(s): GLUCAP in the last 72 hours. Recent Results (from the past 240 hour(s))  Respiratory Panel by RT PCR (Flu A&B, Covid) - Nasopharyngeal Swab     Status: None   Collection Time: 05/19/19 12:27 PM   Specimen: Nasopharyngeal Swab  Result Value Ref Range Status   SARS Coronavirus 2 by RT PCR NEGATIVE NEGATIVE Final    Comment: (NOTE) SARS-CoV-2 target nucleic acids are NOT DETECTED. The SARS-CoV-2 RNA is generally detectable in upper respiratoy specimens during the acute phase of infection.  The lowest concentration of SARS-CoV-2 viral copies this assay can detect is 131 copies/mL. A negative result does not preclude SARS-Cov-2 infection and should not be used as the sole basis for treatment or other patient management decisions. A negative result may occur with  improper specimen collection/handling, submission of specimen other than nasopharyngeal swab, presence of viral mutation(s) within the areas targeted by this assay, and inadequate number of viral copies (<131 copies/mL). A negative result must be combined with clinical observations, patient history, and epidemiological information. The expected result is Negative. Fact Sheet for Patients:  https://www.moore.com/ Fact Sheet for Healthcare Providers:  https://www.young.biz/ This test is not yet ap proved or cleared by the [place] FDA and  has been authorized for detection and/or diagnosis of SARS-CoV-2 by FDA under an Emergency Use Authorization (EUA). This EUA will remain  in effect (meaning this test can be used) for the duration of the COVID-19 declaration under Section 564(b)(1) of the Act, 21 U.S.C. section 360bbb-3(b)(1), unless the authorization is terminated or revoked sooner.    Influenza A by PCR NEGATIVE NEGATIVE Final   Influenza B by PCR NEGATIVE NEGATIVE Final    Comment: (NOTE) The Xpert Xpress SARS-CoV-2/FLU/RSV assay is intended as an aid in  the diagnosis of influenza from Nasopharyngeal swab specimens and  should not be used as a sole basis for treatment. Nasal washings and  aspirates are unacceptable for Xpert Xpress SARS-CoV-2/FLU/RSV  testing. Fact Sheet for Patients: https://www.moore.com/ Fact Sheet for Healthcare Providers: https://www.young.biz/ This test is not yet approved or cleared by the [place] FDA and  has been authorized for detection and/or diagnosis of SARS-CoV-2 by  FDA under an  Emergency Use Authorization (EUA). This EUA will remain  in effect (meaning this test can be used) for the duration of the  Covid-19 declaration under Section 564(b)(1) of the Act, 21  U.S.C. section 360bbb-3(b)(1), unless the authorization is  terminated or revoked. Performed at [facility], [address], [place], [place] [address]      Studies: DG CHEST PORT 1 VIEW  Result Date: 05/24/2019 CLINICAL DATA:  Acute on chronic respiratory failure. EXAM: PORTABLE CHEST 1 VIEW COMPARISON:  05/19/2019 FINDINGS: Patient is rotated to the right. There is mild motion artifact. Lungs are adequately inflated with interval worsening bibasilar opacification likely moderate bilateral effusions with associated bibasilar atelectasis. Infection in the mid to lower lungs is possible. Stable cardiomegaly with mild prominence of the perihilar markings suggesting mild vascular congestion. Remainder the exam is unchanged. IMPRESSION: Mild cardiomegaly with mild vascular congestion. Worsening bilateral moderate pleural effusions likely with associated bibasilar atelectasis. Infection in the lung bases is possible. Electronically Signed   By: Elberta Fortis M.D.   On: 05/24/2019 10:40   ECHOCARDIOGRAM LIMITED  Result Date: 05/23/2019    ECHOCARDIOGRAM LIMITED REPORT   Patient Name:   [name] Date of Exam: 05/23/2019 Medical Rec #:  [id number]      Height:       64.0 in Accession #:    [id number]     Weight:       195.1 lb Date of Birth:  1932-05-25      BSA:          1.936 m Patient Age:    86 years       BP:           129/70 mmHg Patient Gender: F              HR:           78 bpm. [facility] Procedure: Limited Echo and Cardiac Doppler Indications:    Pericardial Effusion 423.9 / l31.3  History:        Patient has prior history of Echocardiogram examinations, most                 recent 05/20/2019. CHF, Arrythmias:Atrial Fibrillation; Risk                 Factors:Hypertension and Dyslipidemia.  Non-Smoker. Acute                 hypoxemic respiratory failure , First degree atrioventricular                 block, GERD, Non-rheumatic mitral regurgitation.  Sonographer:    Celesta Gentile RCS Referring Phys: [phone] Samaj Wessells L Brady Schiller IMPRESSIONS  1. Limited study.  2. Large pericardial effusion, circumferential but to greater degree posterior and lateral. Dimensions are similar to previous study on February 17. No obvious significant respiratory variation in mitral outflow to suggest tamponade physiology. There is  no compression of RA or RV either, but evidence of significant pulmonary hypertension (last study RVSP was approximately 75 mmHg) and this could be inhibiting compression of RV chamber. Organizing material adherent to pericardium suggests possible chronicity, particularly given effusion size.  3. Left ventricular ejection fraction, by estimation, is 60 to 65%. The left ventricle has normal function. The left ventricle has no regional wall motion abnormalities. There is the interventricular septum is flattened in systole and diastole, consistent with right ventricular pressure and volume overload.  4. The right ventricular size is moderately enlarged.  5. Left atrial size was severely dilated.  6. Right atrial size was severely dilated.  7. The mitral valve is grossly normal. FINDINGS  Left Ventricle: Left ventricular ejection fraction, by estimation, is 60 to 65%. The left ventricle has normal function. The left ventricle has no regional wall motion abnormalities. The interventricular septum is flattened in systole and diastole, consistent with right ventricular pressure and volume overload. Right Ventricle: The right ventricular size is moderately enlarged. Left Atrium: Left atrial size was severely dilated. Right Atrium: Right atrial size was severely dilated. Pericardium: Dimensions are similar to previous study on February 17. No obvious significant respiratory variation in mitral outflow to suggest  tamponade physiology. There is no compression of RA or RV either, but evidence of significant pulmonary hypertension (last study RVSP was approximately 75 mmHg) and this could be inhibiting compression of RV chamber. Organizing material adherent to pericardium suggests possible chronicity, particularly given effusion size. A large pericardial effusion is present. The pericardial effusion is circumferential. Mitral Valve: The mitral valve is grossly normal. Mild to moderate mitral annular calcification. Additional Comments: There is pleural effusion in the left lateral region. Nona Dell MD Electronically signed by Nona Dell MD Signature Date/Time: 05/23/2019/2:09:38 PM  Final    Scheduled Meds: . apixaban  5 mg Oral BID  . calcium-vitamin D  1 tablet Oral Q breakfast  . Chlorhexidine Gluconate Cloth  6 each Topical Daily  . cholecalciferol  2,000 Units Oral Daily  . furosemide  40 mg Intravenous Daily  . ipratropium-albuterol  3 mL Nebulization Q6H  . iron polysaccharides  150 mg Oral Daily  . levothyroxine  137 mcg Oral Q0600  . multivitamin with minerals  1 tablet Oral Daily  . pantoprazole  40 mg Oral Daily  . potassium chloride  10 mEq Oral Daily  . sodium chloride flush  3 mL Intravenous Q12H   Continuous Infusions: . sodium chloride     Principal Problem:   Acute hypoxemic respiratory failure (HCC) Active Problems:   Hypothyroidism   GERD (gastroesophageal reflux disease)   Hyperlipidemia   AF (atrial fibrillation) (HCC)   Vitamin D deficiency   Metabolic syndrome   Venous stasis of both lower extremities   Acute congestive heart failure (HCC)   Volume overload   Dysphagia   Achalasia  Time spent:   Standley Dakins, MD [facility] 05/24/2019, 3:43 PM    LOS: 5 days  How to contact the [facility] Attending or Consulting provider 7A - 7P or covering provider during after hours 7P -7A, for this patient?  1. Check the care team in [facility] and look for a)  attending/consulting [facility] provider listed and b) the [facility] team listed 2. Log into [email or web address] and use [place]'s universal password to access. If you do not have the password, please contact the hospital operator. 3. Locate the [facility] provider you are looking for under [facility] and page to a number that you can be directly reached. 4. If you still have difficulty reaching the provider, please page the [facility] (Director on Call) for the Hospitalists listed on amion for assistance.

## 2019-05-25 ENCOUNTER — Inpatient Hospital Stay (HOSPITAL_COMMUNITY): Payer: Medicare Other

## 2019-05-25 DIAGNOSIS — Z515 Encounter for palliative care: Secondary | ICD-10-CM

## 2019-05-25 DIAGNOSIS — J90 Pleural effusion, not elsewhere classified: Secondary | ICD-10-CM

## 2019-05-25 DIAGNOSIS — Z7189 Other specified counseling: Secondary | ICD-10-CM

## 2019-05-25 DIAGNOSIS — I509 Heart failure, unspecified: Secondary | ICD-10-CM

## 2019-05-25 LAB — CBC
HCT: 39.7 % (ref 36.0–46.0)
Hemoglobin: 11.5 g/dL — ABNORMAL LOW (ref 12.0–15.0)
MCH: 31.3 pg (ref 26.0–34.0)
MCHC: 29 g/dL — ABNORMAL LOW (ref 30.0–36.0)
MCV: 107.9 fL — ABNORMAL HIGH (ref 80.0–100.0)
Platelets: 79 10*3/uL — ABNORMAL LOW (ref 150–400)
RBC: 3.68 MIL/uL — ABNORMAL LOW (ref 3.87–5.11)
RDW: 15.9 % — ABNORMAL HIGH (ref 11.5–15.5)
WBC: 3.6 10*3/uL — ABNORMAL LOW (ref 4.0–10.5)
nRBC: 0 % (ref 0.0–0.2)

## 2019-05-25 LAB — BASIC METABOLIC PANEL
Anion gap: 10 (ref 5–15)
BUN: 59 mg/dL — ABNORMAL HIGH (ref 8–23)
CO2: 38 mmol/L — ABNORMAL HIGH (ref 22–32)
Calcium: 9.4 mg/dL (ref 8.9–10.3)
Chloride: 99 mmol/L (ref 98–111)
Creatinine, Ser: 1.52 mg/dL — ABNORMAL HIGH (ref 0.44–1.00)
GFR calc Af Amer: 36 mL/min — ABNORMAL LOW (ref >60)
GFR calc non Af Amer: 31 mL/min — ABNORMAL LOW (ref >60)
Glucose, Bld: 103 mg/dL — ABNORMAL HIGH (ref 70–99)
Potassium: 4.9 mmol/L (ref 3.5–5.1)
Sodium: 147 mmol/L — ABNORMAL HIGH (ref 135–145)

## 2019-05-25 MED ORDER — FUROSEMIDE 10 MG/ML IJ SOLN
40.0000 mg | Freq: Every day | INTRAMUSCULAR | Status: DC
  Administered 2019-05-27: 40 mg via INTRAVENOUS
  Filled 2019-05-25: qty 4

## 2019-05-25 MED ORDER — APIXABAN 2.5 MG PO TABS
2.5000 mg | ORAL_TABLET | Freq: Two times a day (BID) | ORAL | Status: DC
  Filled 2019-05-25: qty 1

## 2019-05-25 MED ORDER — LORAZEPAM 2 MG/ML IJ SOLN
0.2500 mg | Freq: Once | INTRAMUSCULAR | Status: AC | PRN
  Administered 2019-05-25: 0.25 mg via INTRAVENOUS
  Filled 2019-05-25: qty 1

## 2019-05-25 MED ORDER — DEXTROSE 5 % IV BOLUS
250.0000 mL | Freq: Once | INTRAVENOUS | Status: AC
  Administered 2019-05-25: 08:00:00 250 mL via INTRAVENOUS

## 2019-05-25 MED ORDER — DEXTROSE 5 % IV BOLUS: 250.0000 mL | Freq: Once | INTRAVENOUS | Status: DC

## 2019-05-25 NOTE — Progress Notes (Signed)
SLP Cancellation Note  Patient Details Name: [name] MRN: [id number] DOB: [birth year]   Cancelled treatment:       Reason Eval/Treat Not Completed: Patient's level of consciousness; Pt has been placed on full liquids per GI consult last week. Pt has been lethargic most of the day today and has not been eating per RN. Pt with h/o achalasia and has f/u with [facility] post d/c. BSE not completed this date due to Pt with lethargy. Will check back as schedule permits.  Thank you,  [name], CCC-SLP [phone]    [name] 05/25/2019, 3:05 PM

## 2019-05-25 NOTE — Consult Note (Signed)
Consultation Note Date: 05/25/19  Patient Name: [name]  DOB: 1932/05/01  MRN: [id number]  Age / Sex: 86 y.o., female  PCP: Janora Norlander, DO Referring Physician: Murlean Iba, MD  Reason for Consultation: Establishing goals of care  HPI/Patient Profile: 86 y.o. female  with past medical history of atrial fibrillation on Eliquis, GERD, HTN, hypothyroidism, lymphedema, anemia, CKD stage IIIB admitted on 05/19/2019 with shortness of breath, BLE edema, weight gain, and hypoxia from PCP office. Hospital admission for acute respiratory failure secondary to heart failure, preserved EF receiving IV lasix. Bilateral pleural effusions with worsening chest xray on 2/21. Large pericardial effusion evaluated by cardiology and felt to be stable and possibly chronic. AMS with negative CT head. Worsening respiratory status on 2/21 requiring NRB mask. Palliative medicine consultation for goals of care.   Clinical Assessment and Goals of Care:  I have reviewed medical records, discussed with Dr. Wynetta Emery and RN, and met with patient and son Elberta Fortis) at bedside to discuss goals of care. Patient was lethargic this morning. This afternoon, she is still very drowsy but per son, he feels she is more responsive compared to yesterday. She will open eyes intermittently to voice and answer some simple questions.   I introduced Palliative Medicine as specialized medical care for people living with serious illness. It focuses on providing relief from the symptoms and stress of a serious illness. The goal is to improve quality of life for both the patient and the family.  We discussed a brief life review of the patient. Prior to admission, patient living home alone and independent of ADL's. Intermittently using cane for ambulation in the last few weeks. Drove herself to PCP the day of admission. Elberta Fortis reports she has  experienced increased dyspnea with exertion.   Discussed events leading up to admission and course of hospitalization including diagnoses, interventions, plan of care. Explained that Dr. Wynetta Emery has put thoracentesis on hold for today because his concern she would not tolerate the procedure. Explained challenges with giving increased lasix with worsening kidney functions.   I attempted to elicit values and goals of care important to the son. Again, he feels his mother is more responsive today compared to yesterday. He is hopeful she will continue to show improvement and also still interested in thoracentesis if Dr. Wynetta Emery will consider if mental status improves. Reassured Elberta Fortis that I would further discuss with Dr. Wynetta Emery.   Advanced directives, concepts specific to code status, artifical feeding and hydration were discussed. The patient does not have a documented living will. Elberta Fortis is only living child and default HCPOA. Explained his mother's wishes for DNR code status upon admission. This does not surprise Elberta Fortis and he states "she wouldn't want that." Son confirms DNR decision.   We discussed concern that with ongoing lethargy, she was unable to take medications and has not eaten in days.   Discussed continued medical management and watchful waiting. Reassured of ongoing support from PMT provider this admission. PMT contact information given.    SUMMARY  OF RECOMMENDATIONS    Patient does not have documented living will. Son Elberta Fortis) is default HCPOA.   Son confirms DNR code status.   Son feels she is slightly more responsive compared to yesterday. Son hopeful for improvement.   Continue current plan of care and medical management per attending.   Son agreeable with thoracentesis if necessary. Discussed with Dr. Wynetta Emery.   PMT provider will continue to follow inpatient.   Code Status/Advance Care Planning:  DNR  Symptom Management:   Per attending  Palliative  Prophylaxis:   Aspiration and Delirium Protocol   Psycho-social/Spiritual:   Desire for further Chaplaincy support:yes  Additional Recommendations: Caregiving  Support/Resources and Education on Hospice  Prognosis:   Unable to determine: guarded  Discharge Planning: To Be Determined      Primary Diagnoses: Present on Admission: . Acute hypoxemic respiratory failure ([place]) . Hypothyroidism . GERD (gastroesophageal reflux disease) . Hyperlipidemia . AF (atrial fibrillation) ([place]) . Vitamin D deficiency . Metabolic syndrome . Venous stasis of both lower extremities . Volume overload   I have reviewed the medical record, interviewed the patient and family, and examined the patient. The following aspects are pertinent.  Past Medical History:  Diagnosis Date  . AF (atrial fibrillation) ([place])   . Arthritis   . Diaphragmatic hernia without mention of obstruction or gangrene   . Esophagitis   . First degree atrioventricular block   . Gastric polyp   . GERD (gastroesophageal reflux disease)   . Heme positive stool   . Hyperlipidemia   . Hypertension   . Hypothyroid   . Prolapse of vaginal walls without mention of uterine prolapse   . Rectal polyp    Social History   Socioeconomic History  . Marital status: Divorced    Spouse name: Not on file  . Number of children: 2  . Years of education: 10th grade  . Highest education level: 10th grade  Occupational History  . Occupation: retired    Comment: [employer]  Tobacco Use  . Smoking status: Never Smoker  . Smokeless tobacco: Never Used  Substance and Sexual Activity  . Alcohol use: No  . Drug use: No  . Sexual activity: Not Currently  Other Topics Concern  . Not on file  Social History Narrative  . Not on file   Social Determinants of Health   Financial Resource Strain:   . Difficulty of Paying Living Expenses: Not on file  Food Insecurity:   . Worried About Charity fundraiser in the Last Year: Not on file    . Ran Out of Food in the Last Year: Not on file  Transportation Needs:   . Lack of Transportation (Medical): Not on file  . Lack of Transportation (Non-Medical): Not on file  Physical Activity:   . Days of Exercise per Week: Not on file  . Minutes of Exercise per Session: Not on file  Stress:   . Feeling of Stress : Not on file  Social Connections:   . Frequency of Communication with Friends and Family: Not on file  . Frequency of Social Gatherings with Friends and Family: Not on file  . Attends Religious Services: Not on file  . Active Member of Clubs or Organizations: Not on file  . Attends Archivist Meetings: Not on file  . Marital Status: Not on file   Family History  Problem Relation Age of Onset  . Hypertension Mother   . Aneurysm Mother  Brain  . Hypertension Father   . Kidney disease Father   . Aneurysm Father        heart  . Arthritis Daughter   . Cancer Daughter        breast  . Heart attack Daughter   . Hyperlipidemia Sister    Scheduled Meds: . apixaban  5 mg Oral BID  . calcium-vitamin D  1 tablet Oral Q breakfast  . Chlorhexidine Gluconate Cloth  6 each Topical Daily  . cholecalciferol  2,000 Units Oral Daily  . [START ON 05/27/2019] furosemide  40 mg Intravenous Daily  . ipratropium-albuterol  3 mL Nebulization Q6H  . iron polysaccharides  150 mg Oral Daily  . levothyroxine  137 mcg Oral Q0600  . multivitamin with minerals  1 tablet Oral Daily  . pantoprazole  40 mg Oral Daily  . potassium chloride  10 mEq Oral Daily  . sodium chloride flush  3 mL Intravenous Q12H   Continuous Infusions: . sodium chloride    . dextrose     PRN Meds:.sodium chloride, acetaminophen **OR** acetaminophen, albuterol, ondansetron **OR** ondansetron (ZOFRAN) IV, sodium chloride, sodium chloride flush Medications Prior to Admission:  Prior to Admission medications   Medication Sig Start Date End Date Taking? Authorizing Provider  calcium-vitamin D (OSCAL  WITH D) 500-200 MG-UNIT per tablet Take 1 tablet by mouth daily.   Yes [provider]  Cholecalciferol (VITAMIN D3) 2000 UNITS TABS Take 1 tablet by mouth daily.     Yes [provider]  ELIQUIS 5 MG TABS tablet TAKE  (1)  TABLET TWICE A DAY. Patient taking differently: Take 5 mg by mouth 2 (two) times daily.  05/14/19  Yes Minus Breeding, MD  furosemide (LASIX) 40 MG tablet TAKE 1 TABLET DAILY. Patient taking differently: Take 40 mg by mouth daily.  04/24/19  Yes Gottschalk, Leatrice Jewels M, DO  gabapentin (NEURONTIN) 100 MG capsule Needs to be seen for further refills. One by mouth twice daily. Patient taking differently: Take 100 mg by mouth daily.  05/14/19  Yes Ronnie Doss M, DO  iron polysaccharides (FERREX 150) 150 MG capsule Take 1 capsule (150 mg total) by mouth daily. 03/03/19  Yes Gottschalk, Ashly M, DO  KLOR-CON 10 10 MEQ tablet TAKE 1 TABLET DAILY Patient taking differently: Take 10 mEq by mouth at bedtime.  05/14/19  Yes Ronnie Doss M, DO  levothyroxine (SYNTHROID) 137 MCG tablet Take 1 tablet (137 mcg total) by mouth daily. as directed 11/10/18  Yes Gottschalk, Ashly M, DO  lisinopril (ZESTRIL) 40 MG tablet TAKE 1 TABLET DAILY Patient taking differently: Take 40 mg by mouth daily.  04/24/19  Yes Gottschalk, Ashly M, DO  LORazepam (ATIVAN) 0.5 MG tablet One half tablet to one daily if needed for anxiety Patient taking differently: Take 0.25-0.5 mg by mouth daily as needed for anxiety.  11/07/18  Yes Ronnie Doss M, DO  Multiple Vitamins-Minerals (CENTRUM SILVER) tablet Take 1 tablet by mouth daily.    Yes [provider]  omeprazole (PRILOSEC) 20 MG capsule TAKE 1 CAPSULE BY MOUTH TWICE DAILY BEFORE A MEAL (Needs to be seen before next refill) Patient taking differently: Take 20 mg by mouth 2 (two) times daily before a meal.  04/24/19  Yes Gottschalk, Ashly M, DO  polyethylene glycol powder (GLYCOLAX/MIRALAX) powder DISSOLVE 17 GRAMS (ONE CAPFUL) OF  POWDER IN WATER & DRINK ONCE DAILY AS NEEDED Patient taking differently: Take 17 g by mouth daily. Doesn't take med regularly. Takes as needed. Patients  states she needs it now. 01/08/17  Yes Chipper Herb, MD  albuterol (PROVENTIL HFA;VENTOLIN HFA) 108 (90 Base) MCG/ACT inhaler Inhale 2 puffs into the lungs every 6 (six) hours as needed for wheezing or shortness of breath. 04/11/18   Chipper Herb, MD  mupirocin ointment (BACTROBAN) 2 % Place 1 application into the nose 2 (two) times daily. Patient not taking: Reported on 05/20/2019 12/12/18   Dettinger, Fransisca Kaufmann, MD   Allergies  Allergen Reactions  . Aspirin Nausea Only  . Codeine Nausea Only  . Sulfa Antibiotics   . Vicodin [Hydrocodone-Acetaminophen] Nausea Only  . Warfarin Sodium Other (See Comments)    Tingling all over  . Cephalexin Rash    Red, rash covering lower limbs.  . Doxycycline Rash   Review of Systems  Unable to perform ROS: Acuity of condition   Physical Exam Vitals and nursing note reviewed.  Constitutional:      Appearance: She is ill-appearing.  HENT:     Head: Normocephalic and atraumatic.  Cardiovascular:     Rate and Rhythm: Rhythm irregularly irregular.  Pulmonary:     Breath sounds: Normal breath sounds.     Comments: Intermittent dyspnea at rest. On 3L [place]. Abdominal:     Tenderness: There is no abdominal tenderness.  Skin:    General: Skin is warm and dry.     Comments: Generalized edema  Neurological:     Mental Status: She is lethargic.     Comments: Intermittently will open eyes to voice and answer yes/no questions. Very drowsy and unable to participate in [place] discussion.   Psychiatric:        Attention and Perception: She is inattentive.        Cognition and Memory: Cognition is impaired.    Vital Signs: BP 122/75 (BP Location: Right Arm)   Pulse 80   Temp 98.5 F (36.9 C)   Resp (!) 24   Ht 5' 4" (1.626 m)   Wt 86.6 kg   SpO2 94%   BMI 32.77 kg/m  Pain Scale: PAINAD POSS *See  Group Information*: S-Acceptable,Sleep, easy to arouse Pain Score: 0-No pain   SpO2: SpO2: 94 % O2 Device:SpO2: 94 % O2 Flow Rate: .O2 Flow Rate (L/min): 3 L/min  IO: Intake/output summary:   Intake/Output Summary (Last 24 hours) at 05/26/2019 0856 Last data filed at 05/26/2019 0700 Gross per 24 hour  Intake 120 ml  Output 1000 ml  Net -880 ml    LBM: Last BM Date: 05/22/19 Baseline Weight: Weight: 95 kg Most recent weight: Weight: 86.6 kg     Palliative Assessment/Data: PPS 20%   Flowsheet Rows     Most Recent Value  Intake Tab  Referral Department  Hospitalist  Unit at Time of Referral  Med/Surg Unit  Palliative Care Primary Diagnosis  Cardiac  Palliative Care Type  New Palliative care  Reason for referral  Clarify Goals of Care  Date first seen by Palliative Care  05/25/19  Clinical Assessment  Palliative Performance Scale Score  20%  Psychosocial & Spiritual Assessment  Palliative Care Outcomes  Patient/Family meeting held?  Yes  Who was at the meeting?  son  Palliative Care Outcomes  Clarified goals of care, Provided end of life care assistance, Provided psychosocial or spiritual support, ACP counseling assistance      Time In/Out: 1000-1015, 1220-1320 Time Total: 75 Greater than 50%  of this time was spent counseling and coordinating care related to the above assessment and plan.  Signed by:  Ihor Dow, DNP, FNP-C Palliative Medicine Team  Phone: [phone] Fax: [phone]   Please contact Palliative Medicine Team phone at [phone] for questions and concerns.  For individual provider: See Shea Evans

## 2019-05-25 NOTE — Progress Notes (Addendum)
Turned and sacral dressing placed for protection.  Breathing labored and resp 24 with exp wheezing. o2 sat 94 on 3.5 liters .  Did open eyes and stated name when asked and said no when asked if needed anything.  Mouth swabbed.  Contacted Dr. Laural Benes with  resp rate or 24 which changed status of mews from 1 to 2.

## 2019-05-25 NOTE — Progress Notes (Signed)
Notified resp for prn neb.  O2 sat in 90's but restless and resp 28 with exp wheezing.

## 2019-05-25 NOTE — Progress Notes (Signed)
PMT consult received and chart reviewed. Discussed with Dr. Wynetta Emery. Met with son, [name] at bedside. Son confirms DNR code status but wishes to continue current plan of care and medical management. He feels his mother's cognitive status is slightly improved today, as she is more responsive to him at bedside. Son is hopeful Dr. Wynetta Emery will consider thoracentesis, with hopes it would improve respiratory status. Discussed ongoing medical management and watchful waiting. PMT provider will follow.   Full palliative note to follow.  NO CHARGE  Ihor Dow, [place], FNP-C Palliative Medicine Team  Phone: [phone] Fax: [phone]

## 2019-05-25 NOTE — Care Management Important Message (Signed)
Important Message  Patient Details  Name: [name] MRN: [id number] Date of Birth: 02/18/1933   Medicare Important Message Given:  Yes     Corey Harold 05/25/2019, 1:39 PM

## 2019-05-25 NOTE — Progress Notes (Signed)
PT Cancellation Note  Patient Details Name: [name] MRN: [id number] DOB: 23-May-1932   Cancelled Treatment:    Reason Eval/Treat Not Completed: Fatigue/lethargy limiting ability to participate.  Patient presents lethargic, not very responsive and unable to participate with therapy - RN aware.     2:55 PM, 05/25/19 Ocie Bob, MPT Physical Therapist with [facility] 336 [phone] office [phone] mobile phone

## 2019-05-25 NOTE — Progress Notes (Addendum)
This morning at around 0800 would barely open eyes and had very weak hand grip upon command after being pulled up in bed and having bright lights on.  Breathing still labored and on 3 liters, though lung sounds clear upper lobes. Unable to give po meds due to lethargy and Dr. Laural Benes aware.  Thoracentesis cancelled for today .  Son at bedside.

## 2019-05-25 NOTE — Progress Notes (Signed)
RT called to assess patient. She seems to be resting somewhat. SPO2 96% on 3lpm. BBS clear throughout. No need for neb at this time.

## 2019-05-25 NOTE — Progress Notes (Signed)
PROGRESS NOTE [facility]   [name]  TDV:[id number]  DOB: Feb 07, 1933  DOA: 05/19/2019 PCP: Raliegh Ip, DO  Brief Admission Hx: 86 year old female with A. fib on apixaban, GERD, hypertension, hypothyroidism, chronic lymphedema and chronic anemia, stage IIIb CKD presented with increasing lower extremity edema and shortness of breath with exertion progressive over the past 4 to 6 weeks.  Patient also recalled a 10 pound weight gain.  Patient was sent from PCP office with concerns for new findings of CHF.  MDM/Assessment & Plan:   1. Heart failure, preserved EF-patient initially responding well to IV diuresis with Lasix.  But now she is seems to be declining. 2. Acute hypoxic respiratory failure likely exacerbated by moderate bilateral pleural effusions and moderate to large pericardial effusion.  She is requiring less oxygen support today.  We did hold thoracentesis today but hopefully can do tomorrow. 3. Moderate bilateral pleural effusions - discussed with son, he is leaning towards thoracentesis to help with symptoms if possible.  Hopefully you can get the procedure done 05/26/2019.   4. Permanent atrial fibrillation-rate is well controlled.  Continue apixaban for full anticoagulation. 5. Thrombocytopenia-following closely as patient is fully anticoagulated with apixaban.  Has been stable.  6. Urinary retention - we attempted I/O catheterization for 24 hours and continues to have urinary retention, foley placed.   7. Large pericardial effusion - noted on echo, no tamponade physiology, spoke with cardiologist Dr. Purvis Sheffield, recommended repeating limited Echo on 05/23/19 to reassess and it appears to be stable and possibly chronic.   8. AMS - CT head without contrast with no acute findings. 9. Achalasia - noted on esophagram, GI saw her and she is on full liquid diet and will need close follow up with [facility] in 1-2 weeks post discharge.  10. Stage 3b CKD-mild bump in  creatinine likely from diuresis and poor oral intake, bolused IV fluids today, hold Lasix temporarily. 11. Essential hypertension-patient's blood pressures are stable and we continue to follow. 12. Hypothyroidism-on levothyroxine. 13. GERD-Protonix ordered for GI protection. 14. Chronic lymphedema-patient has had some improvement in edema with diuresis but unable to tolerate TED hoses for long periods of time.  15. Goals of care continue ongoing discussions with palliative medicine team as patient continues to decline and has poor prognosis low likelihood of meaningful recovery I have been discussing this with son over the weekend and updating him on this reality.  I would like to meet with him again tomorrow to discuss patient's progress and appreciate the assistance of the palliative medicine team. 16. DNR / DNI   DVT prophylaxis: Apixaban / TED Hose Code Status: DNR Family Communication: Spoke with son at bedside Disposition Plan: Patient continues to to experience progressive decline in now ongoing discussions with palliative medicine as patient does not seem to be having meaningful improvement with these aggressive treatments, spoke with son and he would like to continue current management at this time and reassess tomorrow, updated by palliative team.  Consultants:    Procedures:  2D Echocardiogram 05/20/19 IMPRESSIONS 1. Left ventricular ejection fraction, by estimation, is 60 to 65%. The left ventricle has normal function. The left ventricle has no regional  wall motion abnormalities. There is mild concentric left ventricular hypertrophy. Left ventricular diastolic parameters are indeterminate. Elevated left ventricular end-diastolic pressure.  2. Right ventricular systolic function is normal. The right ventricular size is mildly enlarged. There is severely elevated pulmonary artery systolic pressure.  3. Left atrial size was severely dilated.  4. Right atrial size was severely  dilated.  5. IVC collapses. No RA or RV collapse. No signs of tamponade. Large pericardial effusion. The pericardial effusion is circumferential.  6. The mitral valve is grossly normal. Mild mitral valve regurgitation.  7. Tricuspid valve regurgitation is moderate.  8. The aortic valve is tricuspid. Aortic valve regurgitation is mild. Mild aortic valve stenosis. Aortic valve area, by VTI measures 1.57 cm. Aortic valve mean gradient measures 17.0 mmHg. Aortic valve Vmax measures 2.79 m/s.  9. The inferior vena cava is dilated in size with >50% respiratory variability, suggesting right atrial pressure of 8 mmHg.   Repeat Limited Echo 05/23/19 IMPRESSIONS  1. Limited study.  2. Large pericardial effusion, circumferential but to greater degree posterior and lateral. Dimensions are similar to previous study on February 17. No obvious significant respiratory variation in mitral outflow to suggest tamponade physiology. There is  no compression of RA or RV either, but evidence of significant pulmonary hypertension (last study RVSP was approximately 75 mmHg) and this could be inhibiting compression of RV chamber. Organizing material adherent to pericardium suggests possible chronicity, particularly given effusion size.  3. Left ventricular ejection fraction, by estimation, is 60 to 65%. The left ventricle has normal function. The left ventricle has no regional wall motion abnormalities. There is the interventricular septum is flattened in systole and diastole, consistent with right ventricular pressure and volume overload.  4. The right ventricular size is moderately enlarged.  5. Left atrial size was severely dilated.  6. Right atrial size was severely dilated.  7. The mitral valve is grossly normal.   Antimicrobials:     Subjective: Pt somnolent but improved some from yesterday.  Still not eating and drinking well.  Breathing much better than yesterday.  On nasal cannula oxygen  now.  Objective: Vitals:   05/25/19 0830 05/25/19 1418 05/25/19 1439 05/25/19 1511  BP:   116/68   Pulse:   69   Resp:   18 (!) 24  Temp:      TempSrc:      SpO2: 96% 98% 95%   Weight:      Height:        Intake/Output Summary (Last 24 hours) at 05/25/2019 1645 Last data filed at 05/24/2019 2215 Gross per 24 hour  Intake 0 ml  Output 300 ml  Net -300 ml   Filed Weights   05/23/19 0534 05/24/19 0500 05/25/19 0500  Weight: 88.5 kg 87.3 kg 87.4 kg   REVIEW OF SYSTEMS  Unable to obtain due to somnolent condition  Exam:  General exam: Awake, somnolent, appears very frail and weak. Respiratory system: Mild tachypnea but good air movement no Rales heard.  Diminished breath sounds at both bases unchanged. Cardiovascular system: Normal S1 & S2 heard. No JVD, murmurs, gallops, clicks.  1+ pitting pedal edema. Gastrointestinal system: Abdomen is nondistended, soft and nontender. Normal bowel sounds heard. Central nervous system: Somnolent but arousable no focal neurological deficits. Extremities: 1+ pitting edema bilateral lower extremities. Chronic LE lymphedema.   Data Reviewed: Basic Metabolic Panel: Recent Labs  Lab 05/20/19 0433 05/21/19 0419 05/22/19 0729 05/23/19 0624 05/25/19 0500  NA 145 143 142 142 147*  K 4.4 4.2 4.4 4.7 4.9  CL 99 96* 95* 96* 99  CO2 35* 37* 37* 36* 38*  GLUCOSE 80 92 107* 90 103*  BUN 31* 31* 30* 38* 59*  CREATININE 1.38* 1.35* 1.29* 1.46* 1.52*  CALCIUM 9.0 9.0 9.1 9.2 9.4  MG 2.2 2.3 2.3 2.3  --  Liver Function Tests: Recent Labs  Lab 05/19/19 1200  AST 28  ALT 15  ALKPHOS 104  BILITOT 1.4*  PROT 7.1  ALBUMIN 3.8   No results for input(s): LIPASE, AMYLASE in the last 168 hours. No results for input(s): AMMONIA in the last 168 hours. CBC: Recent Labs  Lab 05/20/19 0433 05/21/19 0419 05/22/19 0729 05/23/19 0624 05/25/19 0500  WBC 3.6* 4.3 4.6 4.1 3.6*  HGB 11.5* 11.1* 11.5* 11.6* 11.5*  HCT 38.3 38.3 39.0 40.7 39.7   MCV 105.5* 107.0* 107.4* 108.2* 107.9*  PLT 87* 90* 88* 84* 79*   Cardiac Enzymes: No results for input(s): CKTOTAL, CKMB, CKMBINDEX, TROPONINI in the last 168 hours. CBG (last 3)  No results for input(s): GLUCAP in the last 72 hours. Recent Results (from the past 240 hour(s))  Respiratory Panel by RT PCR (Flu A&B, Covid) - Nasopharyngeal Swab     Status: None   Collection Time: 05/19/19 12:27 PM   Specimen: Nasopharyngeal Swab  Result Value Ref Range Status   SARS Coronavirus 2 by RT PCR NEGATIVE NEGATIVE Final    Comment: (NOTE) SARS-CoV-2 target nucleic acids are NOT DETECTED. The SARS-CoV-2 RNA is generally detectable in upper respiratoy specimens during the acute phase of infection. The lowest concentration of SARS-CoV-2 viral copies this assay can detect is 131 copies/mL. A negative result does not preclude SARS-Cov-2 infection and should not be used as the sole basis for treatment or other patient management decisions. A negative result may occur with  improper specimen collection/handling, submission of specimen other than nasopharyngeal swab, presence of viral mutation(s) within the areas targeted by this assay, and inadequate number of viral copies (<131 copies/mL). A negative result must be combined with clinical observations, patient history, and epidemiological information. The expected result is Negative. Fact Sheet for Patients:  https://www.moore.com/ Fact Sheet for Healthcare Providers:  https://www.young.biz/ This test is not yet ap proved or cleared by the [place] FDA and  has been authorized for detection and/or diagnosis of SARS-CoV-2 by FDA under an Emergency Use Authorization (EUA). This EUA will remain  in effect (meaning this test can be used) for the duration of the COVID-19 declaration under Section 564(b)(1) of the Act, 21 U.S.C. section 360bbb-3(b)(1), unless the authorization is terminated or revoked  sooner.    Influenza A by PCR NEGATIVE NEGATIVE Final   Influenza B by PCR NEGATIVE NEGATIVE Final    Comment: (NOTE) The Xpert Xpress SARS-CoV-2/FLU/RSV assay is intended as an aid in  the diagnosis of influenza from Nasopharyngeal swab specimens and  should not be used as a sole basis for treatment. Nasal washings and  aspirates are unacceptable for Xpert Xpress SARS-CoV-2/FLU/RSV  testing. Fact Sheet for Patients: https://www.moore.com/ Fact Sheet for Healthcare Providers: https://www.young.biz/ This test is not yet approved or cleared by the [place] FDA and  has been authorized for detection and/or diagnosis of SARS-CoV-2 by  FDA under an Emergency Use Authorization (EUA). This EUA will remain  in effect (meaning this test can be used) for the duration of the  Covid-19 declaration under Section 564(b)(1) of the Act, 21  U.S.C. section 360bbb-3(b)(1), unless the authorization is  terminated or revoked. Performed at [facility], [address], [place], [place] [address]      Studies: CT HEAD WO CONTRAST  Result Date: 05/24/2019 CLINICAL DATA:  86 year old female with altered mental status. Admitted with respiratory failure. EXAM: CT HEAD WITHOUT CONTRAST TECHNIQUE: Contiguous axial images were obtained from the base of the skull  through the vertex without intravenous contrast. COMPARISON:  Brain MRI 02/25/2018. FINDINGS: Brain: Cerebral volume is stable and within normal limits for age. There is a chronic perivascular space at the inferior left lentiform (normal variant). No midline shift, ventriculomegaly, mass effect, evidence of mass lesion, intracranial hemorrhage or evidence of cortically based acute infarction. Normal for age gray-white matter differentiation. Vascular: Calcified atherosclerosis at the skull base. Skull: No acute osseous abnormality identified. Sinuses/Orbits: Visualized paranasal sinuses and mastoids are stable and  well pneumatized. Other: No acute orbit or scalp soft tissue finding. Scalp vessels are prominent diffusely, although unchanged from the prior MRI. IMPRESSION: Normal for age non contrast CT appearance of the brain. Electronically Signed   By: Genevie Ann M.D.   On: 05/24/2019 18:19   DG CHEST PORT 1 VIEW  Result Date: 05/24/2019 CLINICAL DATA:  Acute on chronic respiratory failure. EXAM: PORTABLE CHEST 1 VIEW COMPARISON:  05/19/2019 FINDINGS: Patient is rotated to the right. There is mild motion artifact. Lungs are adequately inflated with interval worsening bibasilar opacification likely moderate bilateral effusions with associated bibasilar atelectasis. Infection in the mid to lower lungs is possible. Stable cardiomegaly with mild prominence of the perihilar markings suggesting mild vascular congestion. Remainder the exam is unchanged. IMPRESSION: Mild cardiomegaly with mild vascular congestion. Worsening bilateral moderate pleural effusions likely with associated bibasilar atelectasis. Infection in the lung bases is possible. Electronically Signed   By: Marin Olp M.D.   On: 05/24/2019 10:40   Scheduled Meds: . apixaban  2.5 mg Oral BID  . calcium-vitamin D  1 tablet Oral Q breakfast  . Chlorhexidine Gluconate Cloth  6 each Topical Daily  . cholecalciferol  2,000 Units Oral Daily  . [START ON 05/27/2019] furosemide  40 mg Intravenous Daily  . ipratropium-albuterol  3 mL Nebulization Q6H  . iron polysaccharides  150 mg Oral Daily  . levothyroxine  137 mcg Oral Q0600  . multivitamin with minerals  1 tablet Oral Daily  . pantoprazole  40 mg Oral Daily  . potassium chloride  10 mEq Oral Daily  . sodium chloride flush  3 mL Intravenous Q12H   Continuous Infusions: . sodium chloride     Principal Problem:   Acute hypoxemic respiratory failure (HCC) Active Problems:   Hypothyroidism   GERD (gastroesophageal reflux disease)   Hyperlipidemia   AF (atrial fibrillation) (HCC)   Vitamin D  deficiency   Metabolic syndrome   Venous stasis of both lower extremities   Acute congestive heart failure (HCC)   Volume overload   Dysphagia   Achalasia  Time spent:   Irwin Brakeman, MD [facility] 05/25/2019, 4:45 PM    LOS: 6 days  How to contact the [facility] Attending or Consulting provider [place] or covering provider during after hours [place], for this patient?  1. Check the care team in [facility] and look for a) attending/consulting TR[facility] provider listed and b) the [facility] team listed 2. Log into [email or web address] and use [place]'s universal password to access. If you do not have the password, please contact the hospital operator. 3. Locate the [facility] provider you are looking for under [facility] and page to a number that you can be directly reached. 4. If you still have difficulty reaching the provider, please page the [facility] (Director on Call) for the Hospitalists listed on amion for assistance.

## 2019-05-25 NOTE — Progress Notes (Signed)
Upon entering patients room patient appeared to have increased WOB and nasal cannula was noted to be under patients chin. RT checked patients sats and they read 81%. [place] placed back on patient by RT and neb treatment was given. Sats improved to 93%.

## 2019-05-26 ENCOUNTER — Inpatient Hospital Stay (HOSPITAL_COMMUNITY): Payer: Medicare Other

## 2019-05-26 DIAGNOSIS — J9620 Acute and chronic respiratory failure, unspecified whether with hypoxia or hypercapnia: Secondary | ICD-10-CM

## 2019-05-26 DIAGNOSIS — J90 Pleural effusion, not elsewhere classified: Secondary | ICD-10-CM

## 2019-05-26 DIAGNOSIS — Z515 Encounter for palliative care: Secondary | ICD-10-CM

## 2019-05-26 DIAGNOSIS — Z7189 Other specified counseling: Secondary | ICD-10-CM

## 2019-05-26 LAB — BASIC METABOLIC PANEL
Anion gap: 11 (ref 5–15)
BUN: 60 mg/dL — ABNORMAL HIGH (ref 8–23)
CO2: 38 mmol/L — ABNORMAL HIGH (ref 22–32)
Calcium: 9.6 mg/dL (ref 8.9–10.3)
Chloride: 101 mmol/L (ref 98–111)
Creatinine, Ser: 1.34 mg/dL — ABNORMAL HIGH (ref 0.44–1.00)
GFR calc Af Amer: 42 mL/min — ABNORMAL LOW (ref >60)
GFR calc non Af Amer: 36 mL/min — ABNORMAL LOW (ref >60)
Glucose, Bld: 105 mg/dL — ABNORMAL HIGH (ref 70–99)
Potassium: 4.8 mmol/L (ref 3.5–5.1)
Sodium: 150 mmol/L — ABNORMAL HIGH (ref 135–145)

## 2019-05-26 MED ORDER — DEXTROSE 5 % IV SOLN: INTRAVENOUS | Status: AC

## 2019-05-26 MED ORDER — APIXABAN 5 MG PO TABS
5.0000 mg | ORAL_TABLET | Freq: Two times a day (BID) | ORAL | Status: DC
  Administered 2019-05-26 – 2019-06-02 (×14): 5 mg via ORAL
  Filled 2019-05-26 (×14): qty 1

## 2019-05-26 NOTE — TOC Progression Note (Signed)
Transition of Care [facility]) - Progression Note    Patient Details  Name: INTISAR CLAUDIO MRN: [id number] Date of Birth: [birth year]  [facility] CM/SW Contact  Elliot Gault, LCSW Phone Number: 05/26/2019, 9:53 AM  Clinical Narrative:     TOC following. Pt status discussed with MD in Progression today. Palliative consulting now as pt has remained lethargic to the point of not eating or taking medicine. Pt's son felt pt was slightly improved yesterday and is hoping for further progress.  TOC will follow and assist with dc planning as appropriate.  Expected Discharge Plan: Skilled Nursing Facility Barriers to Discharge: Continued Medical Work up  Expected Discharge Plan and Services Expected Discharge Plan: Skilled Nursing Facility       Living arrangements for the past 2 months: Single Family Home                                       Social Determinants of Health (SDOH) Interventions    Readmission Risk Interventions No flowsheet data found.

## 2019-05-26 NOTE — Progress Notes (Signed)
Daily Progress Note   Patient Name: [name]       Date: 05/26/2019 DOB: 04-15-1932  Age: 86 y.o. MRN#: [id number] Attending Physician: Murlean Iba, MD Primary Care Physician: Janora Norlander, DO Admit Date: 05/19/2019  Reason for Consultation/Follow-up: Establishing goals of care  Subjective/GOC: Patient drowsy this afternoon. Will intermittently open eyes to voice and follow simple commands.   Spoke with RN who reports that patient was awake and alert this AM. She took meds with ice cream and assisted with turning in the bed to be placed on bed pan. Large BM. Patient is hard of hearing.   Son, Elberta Fortis at bedside. Elberta Fortis was hoping she would be more awake and alert compared to yesterday. He still agrees with thoracentesis if necessary per attending. Explained concerns with ongoing poor nutritional status and inability to work with physical therapy this morning. Reassured of ongoing support from PMT. Son has PMT contact information and visits daily during lunch.   Length of Stay: 7  Current Medications: Scheduled Meds:  . apixaban  5 mg Oral BID  . calcium-vitamin D  1 tablet Oral Q breakfast  . Chlorhexidine Gluconate Cloth  6 each Topical Daily  . cholecalciferol  2,000 Units Oral Daily  . [START ON 05/27/2019] furosemide  40 mg Intravenous Daily  . ipratropium-albuterol  3 mL Nebulization Q6H  . iron polysaccharides  150 mg Oral Daily  . levothyroxine  137 mcg Oral Q0600  . multivitamin with minerals  1 tablet Oral Daily  . pantoprazole  40 mg Oral Daily  . potassium chloride  10 mEq Oral Daily  . sodium chloride flush  3 mL Intravenous Q12H    Continuous Infusions: . sodium chloride    . dextrose 50 mL/hr at 05/26/19 1032    PRN Meds: sodium chloride,  acetaminophen **OR** acetaminophen, albuterol, ondansetron **OR** ondansetron (ZOFRAN) IV, sodium chloride, sodium chloride flush  Physical Exam Vitals and nursing note reviewed.  Constitutional:      Appearance: She is ill-appearing.  Cardiovascular:     Rate and Rhythm: Rhythm irregularly irregular.  Pulmonary:     Effort: No tachypnea, accessory muscle usage or respiratory distress.  Skin:    General: Skin is warm and dry.  Neurological:     Mental Status: She is easily aroused.  Comments: Drowsy, minimal interaction            Vital Signs: BP 122/75 (BP Location: Right Arm)   Pulse 80   Temp 98.5 F (36.9 C)   Resp (!) 24   Ht 5\' 4"  (1.626 m)   Wt 86.6 kg   SpO2 94%   BMI 32.77 kg/m  SpO2: SpO2: 94 % O2 Device: O2 Device: Nasal Cannula O2 Flow Rate: O2 Flow Rate (L/min): 3 L/min  Intake/output summary:   Intake/Output Summary (Last 24 hours) at 05/26/2019 1249 Last data filed at 05/26/2019 0700 Gross per 24 hour  Intake 120 ml  Output 1000 ml  Net -880 ml   LBM: Last BM Date: 05/22/19 Baseline Weight: Weight: 95 kg Most recent weight: Weight: 86.6 kg       Palliative Assessment/Data: PPS 20%    Flowsheet Rows     Most Recent Value  Intake Tab  Referral Department  Hospitalist  Unit at Time of Referral  Med/Surg Unit  Palliative Care Primary Diagnosis  Cardiac  Palliative Care Type  New Palliative care  Reason for referral  Clarify Goals of Care  Date first seen by Palliative Care  05/25/19  Clinical Assessment  Palliative Performance Scale Score  20%  Psychosocial & Spiritual Assessment  Palliative Care Outcomes  Patient/Family meeting held?  Yes  Who was at the meeting?  son  Palliative Care Outcomes  Clarified goals of care, Provided end of life care assistance, Provided psychosocial or spiritual support, ACP counseling assistance      Patient Active Problem List   Diagnosis Date Noted  . Palliative care by specialist   . Goals of care,  counseling/discussion   . Bilateral pleural effusion   . Dysphagia   . Achalasia   . Acute congestive heart failure (HCC) 05/20/2019  . Volume overload 05/20/2019  . Acute hypoxemic respiratory failure (HCC) 05/19/2019  . Venous stasis of both lower extremities 01/13/2019  . Generalized anxiety disorder 11/07/2018  . Non-rheumatic mitral regurgitation 03/20/2017  . Thrombocytopenia (HCC) 09/14/2016  . Hemoglobin decreased 09/14/2016  . Hearing loss 01/06/2015  . Major depressive disorder, recurrent episode, moderate (HCC) 06/07/2014  . Thoracic aorta atherosclerosis (HCC) 03/04/2014  . Degenerative arthritis of thoracic spine 03/04/2014  . Osteoporosis, post-menopausal 08/12/2013  . Metabolic syndrome 06/23/2013  . Anemia, iron deficiency 03/16/2013  . Vitamin D deficiency 03/16/2013  . HTN (hypertension) 03/16/2013  . Hypothyroidism   . Diaphragmatic hernia without mention of obstruction or gangrene   . GERD (gastroesophageal reflux disease)   . Hyperlipidemia   . Prolapse of vaginal walls without mention of uterine prolapse   . Arthritis   . First degree atrioventricular block   . Symptomatic menopausal or female climacteric states   . AF (atrial fibrillation) (HCC)   . Rectal polyp   . Gastric polyp     Palliative Care Assessment & Plan   Patient Profile: 86 y.o. female  with past medical history of atrial fibrillation on Eliquis, GERD, HTN, hypothyroidism, lymphedema, anemia, CKD stage IIIB admitted on 05/19/2019 with shortness of breath, BLE edema, weight gain, and hypoxia from PCP office. Hospital admission for acute respiratory failure secondary to heart failure, preserved EF receiving IV lasix. Bilateral pleural effusions with worsening chest xray on 2/21. Large pericardial effusion evaluated by cardiology and felt to be stable and possibly chronic. AMS with negative CT head. Worsening respiratory status on 2/21 requiring NRB mask. Palliative medicine consultation for  goals of care.   Assessment: Heart  failure preserved EF Acute hypoxic respiratory failure Bilateral pleural effusions Moderate to large pericardial effusion Atrial fibrillation Urinary retention Hypernatremia AMS Achalasia Chronic lymphedema  Recommendations/Plan:  Patient does not have documented living will. Son Ethelene Browns) is default HCPOA.  DNR code status  Continue current plan of care and medical management. Son hopeful for improvement. Watchful waiting.  Continue PT efforts.  Son agreeable with thoracentesis if necessary. Per attending.  PMT will follow. Ongoing GOC discussions pending clinical course.  Code Status: DNR   Code Status Orders  (From admission, onward)         Start     Ordered   05/19/19 1629  Do not attempt resuscitation (DNR)  Continuous    Question Answer Comment  In the event of cardiac or respiratory ARREST Do not call a "code blue"   In the event of cardiac or respiratory ARREST Do not perform Intubation, CPR, defibrillation or ACLS   In the event of cardiac or respiratory ARREST Use medication by any route, position, wound care, and other measures to relive pain and suffering. May use oxygen, suction and manual treatment of airway obstruction as needed for comfort.      05/19/19 1630        Code Status History    This patient has a current code status but no historical code status.   Advance Care Planning Activity       Prognosis:  Guarded  Discharge Planning:  To Be Determined  Care plan was discussed with son, RN  Thank you for allowing the Palliative Medicine Team to assist in the care of this patient.   Time In: 1150 Time Out: 1220 Total Time 30 Prolonged Time Billed no      Greater than 50%  of this time was spent counseling and coordinating care related to the above assessment and plan.  Vennie Homans, DNP, FNP-C Palliative Medicine Team  Phone: [phone] Fax: [phone]  Please contact Palliative  Medicine Team phone at [phone] for questions and concerns.

## 2019-05-26 NOTE — Progress Notes (Signed)
SLP Cancellation Note  Patient Details Name: [name] MRN: [id number] DOB: 12-08-1932   Cancelled treatment:       Reason Eval/Treat Not Completed: Other (comment)(Dr. Laural Benes requested to cancel SLP BSE order)   Thank you,  [name], CCC-SLP [phone]    [name] 05/26/2019, 2:20 PM

## 2019-05-26 NOTE — Progress Notes (Signed)
PT Cancellation Note  Patient Details Name: [name] MRN: [id number] DOB: 03-22-1933   Cancelled Treatment:    Reason Eval/Treat Not Completed: Fatigue/lethargy limiting ability to participate.  Patient unable to respond to stimuli or open eyes in late AM.  Will check back in PM if time permits.   12:21 PM, 05/26/19 Ocie Bob, MPT Physical Therapist with [facility] [phone] office [phone] mobile phone

## 2019-05-26 NOTE — Progress Notes (Addendum)
PROGRESS NOTE [facility]   [name]  IWO:[id number]  DOB: 1932-07-30  DOA: 05/19/2019 PCP: Raliegh Ip, DO  Brief Admission Hx: 86 year old female with A. fib on apixaban, GERD, hypertension, hypothyroidism, chronic lymphedema and chronic anemia, stage IIIb CKD presented with increasing lower extremity edema and shortness of breath with exertion progressive over the past 4 to 6 weeks.  Patient also recalled a 10 pound weight gain.  Patient was sent from PCP office with concerns for new findings of CHF.  MDM/Assessment & Plan:   1. Heart failure, preserved EF-patient initially responding well to IV diuresis with Lasix.  I put lasix temporarily on hold today due to patient not eating and drinking well in last 24 hours.   2. Hypernatremia - Pt has not been eating and drinking well in last 24 hours, will give gentle hydration IV for 8 hours today and encourage oral intake as she is less somnolent today. Recheck BMP in AM.  3. Acute hypoxic respiratory failure likely exacerbated by moderate bilateral pleural effusions and moderate to large pericardial effusion.  She is requiring less oxygen support today.  We did hold thoracentesis today but hopefully can do tomorrow. 4. Moderate bilateral pleural effusions - discussed with son, he is leaning towards thoracentesis to help with symptoms if possible.  Son was hopeful to get the procedure done 05/26/2019.  I spoke with radiologist Dr. Tyron Russell, he feels like there is not enough fluid there for a successful thoracentesis.  Will get an other CXR in AM and he says he is happy to do it if there is enough fluid there to do it safely.  Son is not happy about not having it done today but I explained to him that Do No Harm is my goal and that I am not convinced that she will get any meaningful relief from the procedure right now.   5. Permanent atrial fibrillation-rate is well controlled.  Continue apixaban for full  anticoagulation. 6. Thrombocytopenia-following closely as patient is fully anticoagulated with apixaban.  Has been stable around 80.   7. Urinary retention - we attempted I/O catheterization for 24 hours and patient continued to have urinary retention, foley placed.   8. Large pericardial effusion - noted on echo, no tamponade physiology, spoke with cardiologist Dr. Purvis Sheffield, recommended repeating limited Echo on 05/23/19 to reassess and it was repeated and appears to be stable and possibly chronic.   9. AMS - CT head without contrast with no acute findings. 10. Achalasia - noted on esophagram, GI saw her and she will need to be on full liquid diet indefinitely or at least until she has close follow up with [facility] in 1-2 weeks post discharge.   See GI consult notes.   11. Stage 3b CKD-mild bump in creatinine likely from diuresis and poor oral intake, bolused IV fluids today, hold Lasix temporarily. 12. Essential hypertension-patient's blood pressures are stable and we continue to follow. 13. Hypothyroidism-on levothyroxine. 14. GERD-Protonix ordered for GI protection. 15. Chronic lymphedema-patient has had some improvement in edema with diuresis but unable to tolerate TED hoses for long periods of time.  16. Goals of care continue ongoing discussions with palliative medicine team as patient continues to decline and has poor prognosis low likelihood of meaningful recovery I have been discussing this with son over the weekend and updating him on this reality.  I appreciate the assistance of the palliative medicine team. 17. DNR / DNI   DVT prophylaxis: Apixaban / TED  Hose Code Status: DNR Family Communication: Spoke with son at bedside Disposition Plan: Patient with progressive decline now ongoing discussions with palliative medicine as patient does not seem to be having meaningful improvement with these aggressive treatments, spoke with son and he would like to attempt thoracentesis to see if  breathing will improve and further decide next steps pending outcome.  Consultants:    Procedures:  2D Echocardiogram 05/20/19 IMPRESSIONS 1. Left ventricular ejection fraction, by estimation, is 60 to 65%. The left ventricle has normal function. The left ventricle has no regional  wall motion abnormalities. There is mild concentric left ventricular hypertrophy. Left ventricular diastolic parameters are indeterminate. Elevated left ventricular end-diastolic pressure.  2. Right ventricular systolic function is normal. The right ventricular size is mildly enlarged. There is severely elevated pulmonary artery systolic pressure.  3. Left atrial size was severely dilated.  4. Right atrial size was severely dilated.  5. IVC collapses. No RA or RV collapse. No signs of tamponade. Large pericardial effusion. The pericardial effusion is circumferential.  6. The mitral valve is grossly normal. Mild mitral valve regurgitation.  7. Tricuspid valve regurgitation is moderate.  8. The aortic valve is tricuspid. Aortic valve regurgitation is mild. Mild aortic valve stenosis. Aortic valve area, by VTI measures 1.57 cm. Aortic valve mean gradient measures 17.0 mmHg. Aortic valve Vmax measures 2.79 m/s.  9. The inferior vena cava is dilated in size with >50% respiratory variability, suggesting right atrial pressure of 8 mmHg.   Repeat Limited Echo 05/23/19 IMPRESSIONS  1. Limited study.  2. Large pericardial effusion, circumferential but to greater degree posterior and lateral. Dimensions are similar to previous study on February 17. No obvious significant respiratory variation in mitral outflow to suggest tamponade physiology. There is  no compression of RA or RV either, but evidence of significant pulmonary hypertension (last study RVSP was approximately 75 mmHg) and this could be inhibiting compression of RV chamber. Organizing material adherent to pericardium suggests possible chronicity,  particularly given effusion size.  3. Left ventricular ejection fraction, by estimation, is 60 to 65%. The left ventricle has normal function. The left ventricle has no regional wall motion abnormalities. There is the interventricular septum is flattened in systole and diastole, consistent with right ventricular pressure and volume overload.  4. The right ventricular size is moderately enlarged.  5. Left atrial size was severely dilated.  6. Right atrial size was severely dilated.  7. The mitral valve is grossly normal.   Antimicrobials:     Subjective: Pt less somnolent today, she was able to take sips of water and answer questions, she denies any complaints  Objective: Vitals:   05/26/19 0103 05/26/19 0500 05/26/19 0517 05/26/19 0758  BP:   122/75   Pulse:   80   Resp:   (!) 24   Temp:   98.5 F (36.9 C)   TempSrc:      SpO2: 92%  92% 94%  Weight:  86.6 kg    Height:        Intake/Output Summary (Last 24 hours) at 05/26/2019 1024 Last data filed at 05/26/2019 0700 Gross per 24 hour  Intake 120 ml  Output 1000 ml  Net -880 ml   Filed Weights   05/24/19 0500 05/25/19 0500 05/26/19 0500  Weight: 87.3 kg 87.4 kg 86.6 kg   REVIEW OF SYSTEMS  Unable to obtain due to somnolent condition  Exam:  General exam: Awake, somnolent, appears very frail and weak.  MOUTH VERY DRY TODAY.  Respiratory system: Mild tachypnea but good air movement no Rales heard.  Diminished breath sounds at both bases unchanged. Cardiovascular system: Normal S1 & S2 heard. No JVD, murmurs, gallops, clicks.  1+ pitting pedal edema. Gastrointestinal system: Abdomen is nondistended, soft and nontender. Normal bowel sounds heard. Central nervous system: Somnolent but arousable no focal neurological deficits. Extremities: 1+ pitting edema bilateral lower extremities. Chronic LE lymphedema.   Data Reviewed: Basic Metabolic Panel: Recent Labs  Lab 05/20/19 0433 05/20/19 0433 05/21/19 0419  05/22/19 0729 05/23/19 0624 05/25/19 0500 05/26/19 0433  NA 145   < > 143 142 142 147* 150*  K 4.4   < > 4.2 4.4 4.7 4.9 4.8  CL 99   < > 96* 95* 96* 99 101  CO2 35*   < > 37* 37* 36* 38* 38*  GLUCOSE 80   < > 92 107* 90 103* 105*  BUN 31*   < > 31* 30* 38* 59* 60*  CREATININE 1.38*   < > 1.35* 1.29* 1.46* 1.52* 1.34*  CALCIUM 9.0   < > 9.0 9.1 9.2 9.4 9.6  MG 2.2  --  2.3 2.3 2.3  --   --    < > = values in this interval not displayed.   Liver Function Tests: Recent Labs  Lab 05/19/19 1200  AST 28  ALT 15  ALKPHOS 104  BILITOT 1.4*  PROT 7.1  ALBUMIN 3.8   No results for input(s): LIPASE, AMYLASE in the last 168 hours. No results for input(s): AMMONIA in the last 168 hours. CBC: Recent Labs  Lab 05/20/19 0433 05/21/19 0419 05/22/19 0729 05/23/19 0624 05/25/19 0500  WBC 3.6* 4.3 4.6 4.1 3.6*  HGB 11.5* 11.1* 11.5* 11.6* 11.5*  HCT 38.3 38.3 39.0 40.7 39.7  MCV 105.5* 107.0* 107.4* 108.2* 107.9*  PLT 87* 90* 88* 84* 79*   Cardiac Enzymes: No results for input(s): CKTOTAL, CKMB, CKMBINDEX, TROPONINI in the last 168 hours. CBG (last 3)  No results for input(s): GLUCAP in the last 72 hours. Recent Results (from the past 240 hour(s))  Respiratory Panel by RT PCR (Flu A&B, Covid) - Nasopharyngeal Swab     Status: None   Collection Time: 05/19/19 12:27 PM   Specimen: Nasopharyngeal Swab  Result Value Ref Range Status   SARS Coronavirus 2 by RT PCR NEGATIVE NEGATIVE Final    Comment: (NOTE) SARS-CoV-2 target nucleic acids are NOT DETECTED. The SARS-CoV-2 RNA is generally detectable in upper respiratoy specimens during the acute phase of infection. The lowest concentration of SARS-CoV-2 viral copies this assay can detect is 131 copies/mL. A negative result does not preclude SARS-Cov-2 infection and should not be used as the sole basis for treatment or other patient management decisions. A negative result may occur with  improper specimen collection/handling,  submission of specimen other than nasopharyngeal swab, presence of viral mutation(s) within the areas targeted by this assay, and inadequate number of viral copies (<131 copies/mL). A negative result must be combined with clinical observations, patient history, and epidemiological information. The expected result is Negative. Fact Sheet for Patients:  PinkCheek.be Fact Sheet for Healthcare Providers:  GravelBags.it This test is not yet ap proved or cleared by the [place] FDA and  has been authorized for detection and/or diagnosis of SARS-CoV-2 by FDA under an Emergency Use Authorization (EUA). This EUA will remain  in effect (meaning this test can be used) for the duration of the COVID-19 declaration under Section 564(b)(1) of the Act, 21 U.S.C. section  360bbb-3(b)(1), unless the authorization is terminated or revoked sooner.    Influenza A by PCR NEGATIVE NEGATIVE Final   Influenza B by PCR NEGATIVE NEGATIVE Final    Comment: (NOTE) The Xpert Xpress SARS-CoV-2/FLU/RSV assay is intended as an aid in  the diagnosis of influenza from Nasopharyngeal swab specimens and  should not be used as a sole basis for treatment. Nasal washings and  aspirates are unacceptable for Xpert Xpress SARS-CoV-2/FLU/RSV  testing. Fact Sheet for Patients: https://www.moore.com/ Fact Sheet for Healthcare Providers: https://www.young.biz/ This test is not yet approved or cleared by the [place] FDA and  has been authorized for detection and/or diagnosis of SARS-CoV-2 by  FDA under an Emergency Use Authorization (EUA). This EUA will remain  in effect (meaning this test can be used) for the duration of the  Covid-19 declaration under Section 564(b)(1) of the Act, 21  U.S.C. section 360bbb-3(b)(1), unless the authorization is  terminated or revoked. Performed at [facility], [address],  [place], [place] [address]      Studies: CT HEAD WO CONTRAST  Result Date: 05/24/2019 CLINICAL DATA:  86 year old female with altered mental status. Admitted with respiratory failure. EXAM: CT HEAD WITHOUT CONTRAST TECHNIQUE: Contiguous axial images were obtained from the base of the skull through the vertex without intravenous contrast. COMPARISON:  Brain MRI 02/25/2018. FINDINGS: Brain: Cerebral volume is stable and within normal limits for age. There is a chronic perivascular space at the inferior left lentiform (normal variant). No midline shift, ventriculomegaly, mass effect, evidence of mass lesion, intracranial hemorrhage or evidence of cortically based acute infarction. Normal for age gray-white matter differentiation. Vascular: Calcified atherosclerosis at the skull base. Skull: No acute osseous abnormality identified. Sinuses/Orbits: Visualized paranasal sinuses and mastoids are stable and well pneumatized. Other: No acute orbit or scalp soft tissue finding. Scalp vessels are prominent diffusely, although unchanged from the prior MRI. IMPRESSION: Normal for age non contrast CT appearance of the brain. Electronically Signed   By: Odessa Fleming M.D.   On: 05/24/2019 18:19   DG CHEST PORT 1 VIEW  Result Date: 05/24/2019 CLINICAL DATA:  Acute on chronic respiratory failure. EXAM: PORTABLE CHEST 1 VIEW COMPARISON:  05/19/2019 FINDINGS: Patient is rotated to the right. There is mild motion artifact. Lungs are adequately inflated with interval worsening bibasilar opacification likely moderate bilateral effusions with associated bibasilar atelectasis. Infection in the mid to lower lungs is possible. Stable cardiomegaly with mild prominence of the perihilar markings suggesting mild vascular congestion. Remainder the exam is unchanged. IMPRESSION: Mild cardiomegaly with mild vascular congestion. Worsening bilateral moderate pleural effusions likely with associated bibasilar atelectasis. Infection in the lung bases is  possible. Electronically Signed   By: Elberta Fortis M.D.   On: 05/24/2019 10:40   Scheduled Meds: . apixaban  5 mg Oral BID  . calcium-vitamin D  1 tablet Oral Q breakfast  . Chlorhexidine Gluconate Cloth  6 each Topical Daily  . cholecalciferol  2,000 Units Oral Daily  . [START ON 05/27/2019] furosemide  40 mg Intravenous Daily  . ipratropium-albuterol  3 mL Nebulization Q6H  . iron polysaccharides  150 mg Oral Daily  . levothyroxine  137 mcg Oral Q0600  . multivitamin with minerals  1 tablet Oral Daily  . pantoprazole  40 mg Oral Daily  . potassium chloride  10 mEq Oral Daily  . sodium chloride flush  3 mL Intravenous Q12H   Continuous Infusions: . sodium chloride    . dextrose     Principal Problem:  Acute hypoxemic respiratory failure (HCC) Active Problems:   Hypothyroidism   GERD (gastroesophageal reflux disease)   Hyperlipidemia   AF (atrial fibrillation) (HCC)   Vitamin D deficiency   Metabolic syndrome   Venous stasis of both lower extremities   Acute congestive heart failure (HCC)   Volume overload   Dysphagia   Achalasia   Palliative care by specialist   Goals of care, counseling/discussion   Bilateral pleural effusion  Time spent:   Standley Dakins, MD [facility] 05/26/2019, 10:24 AM    LOS: 7 days  How to contact the [facility] Attending or Consulting provider 7A - 7P or covering provider during after hours 7P -7A, for this patient?  1. Check the care team in [facility] and look for a) attending/consulting TRH provider listed and b) the [facility] team listed 2. Log into [email or web address] and use [facility]'s universal password to access. If you do not have the password, please contact the hospital operator. 3. Locate the [facility] provider you are looking for under [facility] and page to a number that you can be directly reached. 4. If you still have difficulty reaching the provider, please page the [facility] (Director on Call) for the Hospitalists listed on amion for  assistance.

## 2019-05-27 ENCOUNTER — Inpatient Hospital Stay (HOSPITAL_COMMUNITY): Payer: Medicare Other

## 2019-05-27 DIAGNOSIS — I313 Pericardial effusion (noninflammatory): Secondary | ICD-10-CM

## 2019-05-27 DIAGNOSIS — I3139 Other pericardial effusion (noninflammatory): Secondary | ICD-10-CM

## 2019-05-27 LAB — BASIC METABOLIC PANEL
Anion gap: 8 (ref 5–15)
BUN: 57 mg/dL — ABNORMAL HIGH (ref 8–23)
CO2: 40 mmol/L — ABNORMAL HIGH (ref 22–32)
Calcium: 9.4 mg/dL (ref 8.9–10.3)
Chloride: 101 mmol/L (ref 98–111)
Creatinine, Ser: 1.27 mg/dL — ABNORMAL HIGH (ref 0.44–1.00)
GFR calc Af Amer: 45 mL/min — ABNORMAL LOW (ref >60)
GFR calc non Af Amer: 38 mL/min — ABNORMAL LOW (ref >60)
Glucose, Bld: 123 mg/dL — ABNORMAL HIGH (ref 70–99)
Potassium: 4.8 mmol/L (ref 3.5–5.1)
Sodium: 149 mmol/L — ABNORMAL HIGH (ref 135–145)

## 2019-05-27 MED ORDER — FUROSEMIDE 10 MG/ML IJ SOLN
8.0000 mg/h | INTRAVENOUS | Status: DC
  Administered 2019-05-27: 4 mg/h via INTRAVENOUS
  Administered 2019-05-30: 8 mg/h via INTRAVENOUS
  Filled 2019-05-27 (×6): qty 25

## 2019-05-27 NOTE — Progress Notes (Signed)
Daily Progress Note   Patient Name: [name]       Date: 05/27/2019 DOB: 05-25-1932  Age: 86 y.o. MRN#: [id number] Attending Physician: Erick Blinks, DO Primary Care Physician: Raliegh Ip, DO Admit Date: 05/19/2019  Reason for Consultation/Follow-up: Establishing goals of care  Subjective: Patient will open eyes to voice and answer intermittent questions with encouragement, but remains drowsy. Does not engage in conversation. She has essentially no oral intake and appears very weak. 4L [place].  GOC:  F/u with son, Ethelene Browns at bedside. Discussed course of hospitalization including diagnoses, interventions, plan of care. Frankly and compassionately explained worry with poor prognosis due to heart failure, poor response to aggressive medical management, and declining functional/cognitive/nutritional status. Explained that considering feeding tube in this situation would provide more discomfort with already significant fluid overload from heart failure. Discussed continued medical management versus comfort focused care plan, allowing nature to take course and focus on symptom management. Briefly discussed hospice philosophy and hospice facility. Ethelene Browns does ask questions about visitor policy, sharing that it would be important for his children and nephew to see her.   Dr. Sherryll Burger also followed up with patient/son at bedside. Thoracentesis will not be performed. Plan will be to try continuous lasix infusion and aggressive diuresis for 24 hours. Watchful waiting for outcomes.   Ethelene Browns has PMT contact information and reassured of ongoing support.    Length of Stay: 8  Current Medications: Scheduled Meds:  . apixaban  5 mg Oral BID  . calcium-vitamin D  1 tablet Oral Q breakfast  .  Chlorhexidine Gluconate Cloth  6 each Topical Daily  . cholecalciferol  2,000 Units Oral Daily  . ipratropium-albuterol  3 mL Nebulization Q6H  . iron polysaccharides  150 mg Oral Daily  . levothyroxine  137 mcg Oral Q0600  . multivitamin with minerals  1 tablet Oral Daily  . pantoprazole  40 mg Oral Daily  . sodium chloride flush  3 mL Intravenous Q12H    Continuous Infusions: . sodium chloride    . furosemide (LASIX) infusion      PRN Meds: sodium chloride, acetaminophen **OR** acetaminophen, albuterol, ondansetron **OR** ondansetron (ZOFRAN) IV, sodium chloride, sodium chloride flush  Physical Exam Vitals and nursing note reviewed.  Constitutional:      Appearance: She is ill-appearing.  Cardiovascular:  Rate and Rhythm: Rhythm irregularly irregular.  Pulmonary:     Effort: No tachypnea, accessory muscle usage or respiratory distress.  Skin:    General: Skin is warm and dry.  Neurological:     Mental Status: She is easily aroused.     Comments: Drowsy, minimal interaction            Vital Signs: BP 116/69 (BP Location: Right Arm)   Pulse 72   Temp 98.1 F (36.7 C) (Axillary)   Resp 18   Ht 5\' 4"  (1.626 m)   Wt 86.6 kg   SpO2 100%   BMI 32.77 kg/m  SpO2: SpO2: 100 % O2 Device: O2 Device: Nasal Cannula O2 Flow Rate: O2 Flow Rate (L/min): 3 L/min  Intake/output summary:   Intake/Output Summary (Last 24 hours) at 05/27/2019 1403 Last data filed at 05/27/2019 0900 Gross per 24 hour  Intake 580 ml  Output 500 ml  Net 80 ml   LBM: Last BM Date: 05/22/19 Baseline Weight: Weight: 95 kg Most recent weight: Weight: 86.6 kg       Palliative Assessment/Data: PPS 20%    Flowsheet Rows     Most Recent Value  Intake Tab  Referral Department  Hospitalist  Unit at Time of Referral  Med/Surg Unit  Palliative Care Primary Diagnosis  Cardiac  Palliative Care Type  New Palliative care  Reason for referral  Clarify Goals of Care  Date first seen by Palliative  Care  05/25/19  Clinical Assessment  Palliative Performance Scale Score  20%  Psychosocial & Spiritual Assessment  Palliative Care Outcomes  Patient/Family meeting held?  Yes  Who was at the meeting?  son  Palliative Care Outcomes  Clarified goals of care, Provided end of life care assistance, Provided psychosocial or spiritual support, ACP counseling assistance      Patient Active Problem List   Diagnosis Date Noted  . Palliative care by specialist   . Goals of care, counseling/discussion   . Bilateral pleural effusion   . Acute on chronic respiratory failure (HCC)   . Dysphagia   . Achalasia   . Acute congestive heart failure (HCC) 05/20/2019  . Volume overload 05/20/2019  . Acute hypoxemic respiratory failure (HCC) 05/19/2019  . Venous stasis of both lower extremities 01/13/2019  . Generalized anxiety disorder 11/07/2018  . Non-rheumatic mitral regurgitation 03/20/2017  . Thrombocytopenia (HCC) 09/14/2016  . Hemoglobin decreased 09/14/2016  . Hearing loss 01/06/2015  . Major depressive disorder, recurrent episode, moderate (HCC) 06/07/2014  . Thoracic aorta atherosclerosis (HCC) 03/04/2014  . Degenerative arthritis of thoracic spine 03/04/2014  . Osteoporosis, post-menopausal 08/12/2013  . Metabolic syndrome 06/23/2013  . Anemia, iron deficiency 03/16/2013  . Vitamin D deficiency 03/16/2013  . HTN (hypertension) 03/16/2013  . Hypothyroidism   . Diaphragmatic hernia without mention of obstruction or gangrene   . GERD (gastroesophageal reflux disease)   . Hyperlipidemia   . Prolapse of vaginal walls without mention of uterine prolapse   . Arthritis   . First degree atrioventricular block   . Symptomatic menopausal or female climacteric states   . AF (atrial fibrillation) (HCC)   . Rectal polyp   . Gastric polyp     Palliative Care Assessment & Plan   Patient Profile: 86 y.o. female  with past medical history of atrial fibrillation on Eliquis, GERD, HTN,  hypothyroidism, lymphedema, anemia, CKD stage IIIB admitted on 05/19/2019 with shortness of breath, BLE edema, weight gain, and hypoxia from PCP office. Hospital admission for acute respiratory  failure secondary to heart failure, preserved EF receiving IV lasix. Bilateral pleural effusions with worsening chest xray on 2/21. Large pericardial effusion evaluated by cardiology and felt to be stable and possibly chronic. AMS with negative CT head. Worsening respiratory status on 2/21 requiring NRB mask. Palliative medicine consultation for goals of care.   Assessment: Heart failure preserved EF Acute hypoxic respiratory failure Bilateral pleural effusions Moderate to large pericardial effusion Atrial fibrillation Urinary retention Hypernatremia AMS Achalasia Chronic lymphedema  Recommendations/Plan:  Patient does not have documented living will. Son Elberta Fortis) is default HCPOA.  DNR code status  Continue current plan of care and medical management. Discussed with Dr. Manuella Ghazi. Plan to initiate lasix infusion and monitor for clinical improvement. If lack of improvement, will need further discussions regarding comfort measures/hospice.  PMT will follow.  Code Status: DNR   Code Status Orders  (From admission, onward)         Start     Ordered   05/19/19 1629  Do not attempt resuscitation (DNR)  Continuous    Question Answer Comment  In the event of cardiac or respiratory ARREST Do not call a "code blue"   In the event of cardiac or respiratory ARREST Do not perform Intubation, CPR, defibrillation or ACLS   In the event of cardiac or respiratory ARREST Use medication by any route, position, wound care, and other measures to relive pain and suffering. May use oxygen, suction and manual treatment of airway obstruction as needed for comfort.      05/19/19 1630        Code Status History    This patient has a current code status but no historical code status.   Advance Care Planning  Activity       Prognosis:  Guarded  Discharge Planning:  To Be Determined  Care plan was discussed with son, Dr. Manuella Ghazi  Thank you for allowing the Palliative Medicine Team to assist in the care of this patient.   Time In: 1300 Time Out: 1335 Total Time 35 Prolonged Time Billed no      Greater than 50%  of this time was spent counseling and coordinating care related to the above assessment and plan.  Ihor Dow, DNP, FNP-C Palliative Medicine Team  Phone: [phone] Fax: [phone]  Please contact Palliative Medicine Team phone at [phone] for questions and concerns.

## 2019-05-27 NOTE — Progress Notes (Signed)
Blood clots in urine when foley catheter emptied. MD notified.

## 2019-05-27 NOTE — Progress Notes (Signed)
We have signed off, nothing further to offer at this time. Please call us if we can be of any further assistance.  Thank you for allowing [place] to participate in the care of [name] F Loma Messing, DNP, AGNP-C Adult & Gerontological Nurse Practitioner [facility]

## 2019-05-27 NOTE — Care Management Important Message (Signed)
Important Message  Patient Details  Name: [name] MRN: [id number] Date of Birth: September 20, 1932   Medicare Important Message Given:  Yes     Corey Harold 05/27/2019, 11:48 AM

## 2019-05-27 NOTE — Progress Notes (Signed)
PROGRESS NOTE    [name]  RKY:[id number] DOB: Mar 23, 1933 DOA: 05/19/2019 PCP: Raliegh Ip, DO   Brief Narrative:  86 year old female with A. fib on apixaban, GERD, hypertension, hypothyroidism, chronic lymphedema and chronic anemia, stage IIIb CKD presented with increasing lower extremity edema and shortness of breath with exertion progressive over the past 4 to 6 weeks.  Patient also recalled a 10 pound weight gain.  Patient was sent from PCP office with concerns for new findings of CHF.  2/24: Patient noted to have worsening pulmonary edema on chest x-ray discussed with radiology.  No significant pleural effusions were need for thoracentesis at this time noted.  She continues to be quite lethargic and has diminished appetite.  Appreciate palliative care involvement.  We will try Lasix infusion today and see if there is any significant improvement overnight with further diuresis, otherwise may need to consider comfort care.  Assessment & Plan:   Principal Problem:   Acute hypoxemic respiratory failure (HCC) Active Problems:   Hypothyroidism   GERD (gastroesophageal reflux disease)   Hyperlipidemia   AF (atrial fibrillation) (HCC)   Vitamin D deficiency   Metabolic syndrome   Venous stasis of both lower extremities   Acute congestive heart failure (HCC)   Volume overload   Dysphagia   Achalasia   Palliative care by specialist   Goals of care, counseling/discussion   Bilateral pleural effusion   Acute on chronic respiratory failure (HCC)   1. Heart failure, preserved EF-patient initially responding well to IV diuresis with Lasix, but not any longer.  We will try infusion of Lasix to see how well she does and monitor intake and output. 2. Hypernatremia -currently resolved.  Continue to monitor with repeat BMP in a.m. 3. Acute hypoxic respiratory failure likely exacerbated by significant pulmonary edema.  Continue oxygen support and try Lasix diuresis. 4. Small  bilateral pleural effusions.  Discussed with radiology that thoracentesis would not be of benefit.  Try aggressive diuresis to see if improvement can be obtained. 5. Permanent atrial fibrillation-rate is well controlled.  Continue apixaban for full anticoagulation. 6. Thrombocytopenia-following closely as patient is fully anticoagulated with apixaban.  Has been stable around 80.   7. Urinary retention - we attempted I/O catheterization for 24 hours and patient continued to have urinary retention, foley placed.   8. Large pericardial effusion - noted on echo, no tamponade physiology, spoke with cardiologist Dr. Purvis Sheffield, recommended repeating limited Echo on 05/23/19 to reassess and it was repeated and appears to be stable and possibly chronic.   9. AMS - CT head without contrast with no acute findings. 10. Achalasia - noted on esophagram, GI saw her and she will need to be on full liquid diet indefinitely or at least until she has close follow up with [facility] in 1-2 weeks post discharge.   See GI consult notes.   11. Stage 3b CKD-currently stable and improving.  Try IV Lasix infusion to see if further diuresis may help her renal function further. 12. Essential hypertension-patient's blood pressures are stable and we continue to follow. 13. Hypothyroidism-on levothyroxine. 14. GERD-Protonix ordered for GI protection. 15. Chronic lymphedema-patient has had some improvement in edema with diuresis but unable to tolerate TED hoses for long periods of time.  16. Goals of care continue ongoing discussions with palliative medicine team as patient continues to decline and has poor prognosis low likelihood of meaningful recovery I have been discussing this with son over the weekend and updating him on this  reality.  I appreciate the assistance of the palliative medicine team. 17. DNR / DNI    DVT prophylaxis: Apixaban Code Status: DNR Family Communication: Discussed with son Elberta Fortis at bedside Disposition  Plan: Try IV Lasix infusion and monitor diuresis aggressively.  If this does not appear to improve her symptomatically, will need to consider comfort care.   Consultants:   Palliative care  Procedures:   See below  Antimicrobials:  Anti-infectives (From admission, onward)   None       Subjective: Patient seen and evaluated today and appears to be quite lethargic and has difficulty responding.  She has very poor oral intake.  Objective: Vitals:   05/27/19 0124 05/27/19 0530 05/27/19 0807 05/27/19 1357  BP:  117/73  116/69  Pulse:  68  72  Resp:  20  18  Temp:  98.1 F (36.7 C)    TempSrc:  Axillary    SpO2: 97% 98% 97% 100%  Weight:      Height:        Intake/Output Summary (Last 24 hours) at 05/27/2019 1410 Last data filed at 05/27/2019 0900 Gross per 24 hour  Intake 580 ml  Output 500 ml  Net 80 ml   Filed Weights   05/24/19 0500 05/25/19 0500 05/26/19 0500  Weight: 87.3 kg 87.4 kg 86.6 kg    Examination:  General exam: Appears lethargic Respiratory system: Clear to auscultation. Respiratory effort normal. Cardiovascular system: S1 & S2 heard, RRR. No JVD, murmurs, rubs, gallops or clicks. No pedal edema. Gastrointestinal system: Abdomen is nondistended, soft and nontender. No organomegaly or masses felt. Normal bowel sounds heard. Central nervous system: Alert and awake. Extremities: Continues to have scant edema. Skin: No rashes, lesions or ulcers Psychiatry: Flat affect.    Data Reviewed: I have personally reviewed following labs and imaging studies  CBC: Recent Labs  Lab 05/21/19 0419 05/22/19 0729 05/23/19 0624 05/25/19 0500  WBC 4.3 4.6 4.1 3.6*  HGB 11.1* 11.5* 11.6* 11.5*  HCT 38.3 39.0 40.7 39.7  MCV 107.0* 107.4* 108.2* 107.9*  PLT 90* 88* 84* 79*   Basic Metabolic Panel: Recent Labs  Lab 05/21/19 0419 05/21/19 0419 05/22/19 0729 05/23/19 0624 05/25/19 0500 05/26/19 0433 05/27/19 0430  NA 143   < > 142 142 147* 150* 149*   K 4.2   < > 4.4 4.7 4.9 4.8 4.8  CL 96*   < > 95* 96* 99 101 101  CO2 37*   < > 37* 36* 38* 38* 40*  GLUCOSE 92   < > 107* 90 103* 105* 123*  BUN 31*   < > 30* 38* 59* 60* 57*  CREATININE 1.35*   < > 1.29* 1.46* 1.52* 1.34* 1.27*  CALCIUM 9.0   < > 9.1 9.2 9.4 9.6 9.4  MG 2.3  --  2.3 2.3  --   --   --    < > = values in this interval not displayed.   GFR: Estimated Creatinine Clearance: 34.5 mL/min (A) (by C-G formula based on SCr of 1.27 mg/dL (H)). Liver Function Tests: No results for input(s): AST, ALT, ALKPHOS, BILITOT, PROT, ALBUMIN in the last 168 hours. No results for input(s): LIPASE, AMYLASE in the last 168 hours. No results for input(s): AMMONIA in the last 168 hours. Coagulation Profile: No results for input(s): INR, PROTIME in the last 168 hours. Cardiac Enzymes: No results for input(s): CKTOTAL, CKMB, CKMBINDEX, TROPONINI in the last 168 hours. BNP (last 3 results) No results for input(s):  PROBNP in the last 8760 hours. HbA1C: No results for input(s): HGBA1C in the last 72 hours. CBG: No results for input(s): GLUCAP in the last 168 hours. Lipid Profile: No results for input(s): CHOL, HDL, LDLCALC, TRIG, CHOLHDL, LDLDIRECT in the last 72 hours. Thyroid Function Tests: No results for input(s): TSH, T4TOTAL, FREET4, T3FREE, THYROIDAB in the last 72 hours. Anemia Panel: No results for input(s): VITAMINB12, FOLATE, FERRITIN, TIBC, IRON, RETICCTPCT in the last 72 hours. Sepsis Labs: No results for input(s): PROCALCITON, LATICACIDVEN in the last 168 hours.  Recent Results (from the past 240 hour(s))  Respiratory Panel by RT PCR (Flu A&B, Covid) - Nasopharyngeal Swab     Status: None   Collection Time: 05/19/19 12:27 PM   Specimen: Nasopharyngeal Swab  Result Value Ref Range Status   SARS Coronavirus 2 by RT PCR NEGATIVE NEGATIVE Final    Comment: (NOTE) SARS-CoV-2 target nucleic acids are NOT DETECTED. The SARS-CoV-2 RNA is generally detectable in upper respiratoy  specimens during the acute phase of infection. The lowest concentration of SARS-CoV-2 viral copies this assay can detect is 131 copies/mL. A negative result does not preclude SARS-Cov-2 infection and should not be used as the sole basis for treatment or other patient management decisions. A negative result may occur with  improper specimen collection/handling, submission of specimen other than nasopharyngeal swab, presence of viral mutation(s) within the areas targeted by this assay, and inadequate number of viral copies (<131 copies/mL). A negative result must be combined with clinical observations, patient history, and epidemiological information. The expected result is Negative. Fact Sheet for Patients:  https://www.moore.com/ Fact Sheet for Healthcare Providers:  https://www.young.biz/ This test is not yet ap proved or cleared by the [place] FDA and  has been authorized for detection and/or diagnosis of SARS-CoV-2 by FDA under an Emergency Use Authorization (EUA). This EUA will remain  in effect (meaning this test can be used) for the duration of the COVID-19 declaration under Section 564(b)(1) of the Act, 21 U.S.C. section 360bbb-3(b)(1), unless the authorization is terminated or revoked sooner.    Influenza A by PCR NEGATIVE NEGATIVE Final   Influenza B by PCR NEGATIVE NEGATIVE Final    Comment: (NOTE) The Xpert Xpress SARS-CoV-2/FLU/RSV assay is intended as an aid in  the diagnosis of influenza from Nasopharyngeal swab specimens and  should not be used as a sole basis for treatment. Nasal washings and  aspirates are unacceptable for Xpert Xpress SARS-CoV-2/FLU/RSV  testing. Fact Sheet for Patients: https://www.moore.com/ Fact Sheet for Healthcare Providers: https://www.young.biz/ This test is not yet approved or cleared by the [place] FDA and  has been authorized for detection and/or  diagnosis of SARS-CoV-2 by  FDA under an Emergency Use Authorization (EUA). This EUA will remain  in effect (meaning this test can be used) for the duration of the  Covid-19 declaration under Section 564(b)(1) of the Act, 21  U.S.C. section 360bbb-3(b)(1), unless the authorization is  terminated or revoked. Performed at [facility], [address] [place], [place] [address]          Radiology Studies: DG CHEST PORT 1 VIEW  Result Date: 05/27/2019 CLINICAL DATA:  Bilateral pleural effusion and pericardial effusion, negative COVID-19 testing 8 days prior EXAM: PORTABLE CHEST 1 VIEW COMPARISON:  Radiograph 05/24/2019 FINDINGS: Diminishing lung volumes with persistent bilateral interstitial and airspace opacities, slightly increasing in the right upper and lower lung though this may be attributable to the lower volumes. Stable cardiomegaly. Bilateral effusions. No pneumothorax. No acute osseous  or soft tissue abnormality. Telemetry leads overlie the chest. IMPRESSION: Diminishing lung volumes with persistent bilateral interstitial and airspace opacities, slightly increasing in the right upper and lower lung though this may be attributable to lower volumes. Bilateral effusions. Electronically Signed   By: Kreg Shropshire M.D.   On: 05/27/2019 05:58        Scheduled Meds: . apixaban  5 mg Oral BID  . calcium-vitamin D  1 tablet Oral Q breakfast  . Chlorhexidine Gluconate Cloth  6 each Topical Daily  . cholecalciferol  2,000 Units Oral Daily  . ipratropium-albuterol  3 mL Nebulization Q6H  . iron polysaccharides  150 mg Oral Daily  . levothyroxine  137 mcg Oral Q0600  . multivitamin with minerals  1 tablet Oral Daily  . pantoprazole  40 mg Oral Daily  . sodium chloride flush  3 mL Intravenous Q12H   Continuous Infusions: . sodium chloride    . furosemide (LASIX) infusion       LOS: 8 days    Time spent: 30 minutes    Falon Huesca Hoover Brunette, DO [facility] Pager [phone]   If 7PM-7AM, please contact night-coverage [email or web address] Password [email or web address] 05/27/2019, 2:10 PM

## 2019-05-28 LAB — BASIC METABOLIC PANEL
Anion gap: 9 (ref 5–15)
BUN: 67 mg/dL — ABNORMAL HIGH (ref 8–23)
CO2: 40 mmol/L — ABNORMAL HIGH (ref 22–32)
Calcium: 9.4 mg/dL (ref 8.9–10.3)
Chloride: 101 mmol/L (ref 98–111)
Creatinine, Ser: 1.34 mg/dL — ABNORMAL HIGH (ref 0.44–1.00)
GFR calc Af Amer: 42 mL/min — ABNORMAL LOW (ref >60)
GFR calc non Af Amer: 36 mL/min — ABNORMAL LOW (ref >60)
Glucose, Bld: 82 mg/dL (ref 70–99)
Potassium: 4.9 mmol/L (ref 3.5–5.1)
Sodium: 150 mmol/L — ABNORMAL HIGH (ref 135–145)

## 2019-05-28 LAB — MAGNESIUM: Magnesium: 2.6 mg/dL — ABNORMAL HIGH (ref 1.7–2.4)

## 2019-05-28 NOTE — Progress Notes (Signed)
PROGRESS NOTE    [name]  BDZ:[id number] DOB: May 15, 1932 DOA: 05/19/2019 PCP: Raliegh Ip, DO   Brief Narrative:  86 year old female with A. fib on apixaban, GERD, hypertension, hypothyroidism, chronic lymphedema and chronic anemia, stage IIIb CKD presented with increasing lower extremity edema and shortness of breath with exertion progressive over the past 4 to 6 weeks. Patient also recalled a 10 pound weight gain. Patient was sent from PCP office with concerns for new findings of CHF.  2/24: Patient noted to have worsening pulmonary edema on chest x-ray discussed with radiology.  No significant pleural effusions were need for thoracentesis at this time noted.  She continues to be quite lethargic and has diminished appetite.  Appreciate palliative care involvement.  We will try Lasix infusion today and see if there is any significant improvement overnight with further diuresis, otherwise may need to consider comfort care.  2/25: Patient was started on Lasix infusion yesterday with some minimal diuresis of slightly less than 1 L, but she seems more awake and interactive today.  Will increase Lasix infusion rate and see how much she begins to diurese by tomorrow.  Continue to follow labs.  She will likely need close monitoring through the weekend to see whether or not she improves from the current treatment or if she will need hospice.  Assessment & Plan:   Principal Problem:   Acute hypoxemic respiratory failure (HCC) Active Problems:   Hypothyroidism   GERD (gastroesophageal reflux disease)   Hyperlipidemia   AF (atrial fibrillation) (HCC)   Vitamin D deficiency   Metabolic syndrome   Venous stasis of both lower extremities   Acute congestive heart failure (HCC)   Volume overload   Dysphagia   Achalasia   Palliative care by specialist   Goals of care, counseling/discussion   Bilateral pleural effusion   Acute on chronic respiratory failure (HCC)   Pericardial  effusion   1. Heart failure, preserved EF-continue IV infusion of Lasix now at 8 mg/h and monitor I's and O's.  She appears to be more alert and awake today. 2. Hypernatremia -currently resolved.  Continue to monitor with repeat BMP in a.m. 3. Acute hypoxic respiratory failure likely exacerbated by significant pulmonary edema.  Continue oxygen support and try Lasix diuresis. 4. Small bilateral pleural effusions.  Discussed with radiology that thoracentesis would not be of benefit.  Try aggressive diuresis to see if improvement can be obtained. 5. Permanent atrial fibrillation-rate is well controlled. Continue apixaban for full anticoagulation. 6. Thrombocytopenia-following closely as patient is fully anticoagulated with apixaban. Has been stable around 80.Repeat CBC in a.m. 7. Urinary retention - we attempted I/O catheterization for 24 hours andpatientcontinuedto have urinary retention, foley placed. Continue Foley catheter due to aggressive diuresis. 8. Large pericardial effusion - noted on echo, no tamponade physiology, spoke with cardiologist Dr. Purvis Sheffield, recommended repeating limited Echo on 05/23/19 to reassess and itwas repeated andappears to be stable and possibly chronic.  9. AMS - CT head without contrast with no acute findings.  This appears to be improving with diuresis.  We will continue to follow. 10. Achalasia - noted on esophagram, GI saw her and shewill need to be onfull liquid diet indefinitely or at least until [facility] weeks post discharge. See GI consult notes. 11. Stage 3b CKD-currently stable and improving.  Try IV Lasix infusion to see if further diuresis may help her renal function further. 12. Essential hypertension-patient's blood pressures are stable and we  continue to follow. 13. Hypothyroidism-on levothyroxine. 14. GERD-Protonix ordered for GI protection. 15. Chronic lymphedema-patient has had some improvement in  edema with diuresis but unable to tolerate TED hoses for long periods of time.  16. Goals of care continue ongoing discussions with palliative medicine team as patient continues to decline and has poor prognosis low likelihood of meaningful recovery I have been discussing this with son over the weekend and updating him on this reality. I appreciate the assistance of the palliative medicine team. 17. DNR / DNI    DVT prophylaxis: Apixaban Code Status: DNR Family Communication: Discussed with son Elberta Fortis at bedside Disposition Plan:  Continue ongoing IV Lasix infusion and monitor diuresis.  She appears to still show some signs of improvement.  Appreciate palliative evaluation.   Consultants:   Palliative care  Procedures:   See below  Antimicrobials:   None   Subjective: Patient seen and evaluated today with no new acute complaints or concerns. No acute concerns or events noted overnight.  She appears to be more awake and alert and somewhat interactive.  Having sips of fluid.  Objective: Vitals:   05/28/19 0105 05/28/19 0452 05/28/19 0500 05/28/19 0815  BP:  (!) 104/59    Pulse:  68    Resp:  18    Temp:  97.8 F (36.6 C)    TempSrc:  Oral    SpO2: 97% 97%  97%  Weight:   85 kg   Height:        Intake/Output Summary (Last 24 hours) at 05/28/2019 1044 Last data filed at 05/28/2019 0850 Gross per 24 hour  Intake 340.07 ml  Output 1225 ml  Net -884.93 ml   Filed Weights   05/25/19 0500 05/26/19 0500 05/28/19 0500  Weight: 87.4 kg 86.6 kg 85 kg    Examination:  General exam: Appears calm and comfortable  Respiratory system: Clear to auscultation. Respiratory effort normal.  Currently on 3 L nasal cannula oxygen. Cardiovascular system: S1 & S2 heard, RRR. No JVD, murmurs, rubs, gallops or clicks.  Ongoing bilateral pedal edema noted. Gastrointestinal system: Abdomen is nondistended, soft and nontender. No organomegaly or masses felt. Normal bowel sounds  heard. Central nervous system: Alert and awake Extremities: Continues to remain edematous. Skin: No rashes, lesions or ulcers Psychiatry: Flat affect    Data Reviewed: I have personally reviewed following labs and imaging studies  CBC: Recent Labs  Lab 05/22/19 0729 05/23/19 0624 05/25/19 0500  WBC 4.6 4.1 3.6*  HGB 11.5* 11.6* 11.5*  HCT 39.0 40.7 39.7  MCV 107.4* 108.2* 107.9*  PLT 88* 84* 79*   Basic Metabolic Panel: Recent Labs  Lab 05/22/19 0729 05/22/19 0729 05/23/19 0624 05/25/19 0500 05/26/19 0433 05/27/19 0430 05/28/19 0427  NA 142   < > 142 147* 150* 149* 150*  K 4.4   < > 4.7 4.9 4.8 4.8 4.9  CL 95*   < > 96* 99 101 101 101  CO2 37*   < > 36* 38* 38* 40* 40*  GLUCOSE 107*   < > 90 103* 105* 123* 82  BUN 30*   < > 38* 59* 60* 57* 67*  CREATININE 1.29*   < > 1.46* 1.52* 1.34* 1.27* 1.34*  CALCIUM 9.1   < > 9.2 9.4 9.6 9.4 9.4  MG 2.3  --  2.3  --   --   --  2.6*   < > = values in this interval not displayed.   GFR: Estimated Creatinine Clearance: 32.4 mL/min (  A) (by C-G formula based on SCr of 1.34 mg/dL (H)). Liver Function Tests: No results for input(s): AST, ALT, ALKPHOS, BILITOT, PROT, ALBUMIN in the last 168 hours. No results for input(s): LIPASE, AMYLASE in the last 168 hours. No results for input(s): AMMONIA in the last 168 hours. Coagulation Profile: No results for input(s): INR, PROTIME in the last 168 hours. Cardiac Enzymes: No results for input(s): CKTOTAL, CKMB, CKMBINDEX, TROPONINI in the last 168 hours. BNP (last 3 results) No results for input(s): PROBNP in the last 8760 hours. HbA1C: No results for input(s): HGBA1C in the last 72 hours. CBG: No results for input(s): GLUCAP in the last 168 hours. Lipid Profile: No results for input(s): CHOL, HDL, LDLCALC, TRIG, CHOLHDL, LDLDIRECT in the last 72 hours. Thyroid Function Tests: No results for input(s): TSH, T4TOTAL, FREET4, T3FREE, THYROIDAB in the last 72 hours. Anemia Panel: No  results for input(s): VITAMINB12, FOLATE, FERRITIN, TIBC, IRON, RETICCTPCT in the last 72 hours. Sepsis Labs: No results for input(s): PROCALCITON, LATICACIDVEN in the last 168 hours.  Recent Results (from the past 240 hour(s))  Respiratory Panel by RT PCR (Flu A&B, Covid) - Nasopharyngeal Swab     Status: None   Collection Time: 05/19/19 12:27 PM   Specimen: Nasopharyngeal Swab  Result Value Ref Range Status   SARS Coronavirus 2 by RT PCR NEGATIVE NEGATIVE Final    Comment: (NOTE) SARS-CoV-2 target nucleic acids are NOT DETECTED. The SARS-CoV-2 RNA is generally detectable in upper respiratoy specimens during the acute phase of infection. The lowest concentration of SARS-CoV-2 viral copies this assay can detect is 131 copies/mL. A negative result does not preclude SARS-Cov-2 infection and should not be used as the sole basis for treatment or other patient management decisions. A negative result may occur with  improper specimen collection/handling, submission of specimen other than nasopharyngeal swab, presence of viral mutation(s) within the areas targeted by this assay, and inadequate number of viral copies (<131 copies/mL). A negative result must be combined with clinical observations, patient history, and epidemiological information. The expected result is Negative. Fact Sheet for Patients:  https://www.moore.com/ Fact Sheet for Healthcare Providers:  https://www.young.biz/ This test is not yet ap proved or cleared by the [place] FDA and  has been authorized for detection and/or diagnosis of SARS-CoV-2 by FDA under an Emergency Use Authorization (EUA). This EUA will remain  in effect (meaning this test can be used) for the duration of the COVID-19 declaration under Section 564(b)(1) of the Act, 21 U.S.C. section 360bbb-3(b)(1), unless the authorization is terminated or revoked sooner.    Influenza A by PCR NEGATIVE NEGATIVE Final    Influenza B by PCR NEGATIVE NEGATIVE Final    Comment: (NOTE) The Xpert Xpress SARS-CoV-2/FLU/RSV assay is intended as an aid in  the diagnosis of influenza from Nasopharyngeal swab specimens and  should not be used as a sole basis for treatment. Nasal washings and  aspirates are unacceptable for Xpert Xpress SARS-CoV-2/FLU/RSV  testing. Fact Sheet for Patients: https://www.moore.com/ Fact Sheet for Healthcare Providers: https://www.young.biz/ This test is not yet approved or cleared by the [place] FDA and  has been authorized for detection and/or diagnosis of SARS-CoV-2 by  FDA under an Emergency Use Authorization (EUA). This EUA will remain  in effect (meaning this test can be used) for the duration of the  Covid-19 declaration under Section 564(b)(1) of the Act, 21  U.S.C. section 360bbb-3(b)(1), unless the authorization is  terminated or revoked. Performed at [facility], [address], [place], [place] [address]          Radiology Studies: DG CHEST PORT 1 VIEW  Result Date: 05/27/2019 CLINICAL DATA:  Bilateral pleural effusion and pericardial effusion, negative COVID-19 testing 8 days prior EXAM: PORTABLE CHEST 1 VIEW COMPARISON:  Radiograph 05/24/2019 FINDINGS: Diminishing lung volumes with persistent bilateral interstitial and airspace opacities, slightly increasing in the right upper and lower lung though this may be attributable to the lower volumes. Stable cardiomegaly. Bilateral effusions. No pneumothorax. No acute osseous or soft tissue abnormality. Telemetry leads overlie the chest. IMPRESSION: Diminishing lung volumes with persistent bilateral interstitial and airspace opacities, slightly increasing in the right upper and lower lung though this may be attributable to lower volumes. Bilateral effusions. Electronically Signed   By: Kreg Shropshire M.D.   On: 05/27/2019 05:58        Scheduled Meds: . apixaban  5 mg Oral BID  .  calcium-vitamin D  1 tablet Oral Q breakfast  . Chlorhexidine Gluconate Cloth  6 each Topical Daily  . cholecalciferol  2,000 Units Oral Daily  . ipratropium-albuterol  3 mL Nebulization Q6H  . iron polysaccharides  150 mg Oral Daily  . levothyroxine  137 mcg Oral Q0600  . multivitamin with minerals  1 tablet Oral Daily  . pantoprazole  40 mg Oral Daily  . sodium chloride flush  3 mL Intravenous Q12H   Continuous Infusions: . sodium chloride    . furosemide (LASIX) infusion 4 mg/hr (05/28/19 0333)     LOS: 9 days    Time spent: 30 minutes    Payton Moder Hoover Brunette, DO [facility] Pager [phone]  If 7PM-7AM, please contact night-coverage [email or web address] Password [facility] 05/28/2019, 10:44 AM

## 2019-05-28 NOTE — Progress Notes (Signed)
Daily Progress Note   Patient Name: [name]       Date: 05/28/2019 DOB: July 08, 1932  Age: 86 y.o. MRN#: [id number] Attending Physician: Rodena Goldmann, DO Primary Care Physician: Janora Norlander, DO Admit Date: 05/19/2019  Reason for Consultation/Follow-up: Establishing goals of care  Subjective: Patient more awake and alert today. Following commands and taking bites/sips.  Son not at bedside during visit. Attempted to call son. No answer and vm box full.   Discussed with Dr. Manuella Ghazi. Continue lasix infusion and aggressive diuresis over the weekend. Further GOC/hospice discussions pending clinical improvement.    Length of Stay: 9  Current Medications: Scheduled Meds:  . apixaban  5 mg Oral BID  . calcium-vitamin D  1 tablet Oral Q breakfast  . Chlorhexidine Gluconate Cloth  6 each Topical Daily  . cholecalciferol  2,000 Units Oral Daily  . ipratropium-albuterol  3 mL Nebulization Q6H  . iron polysaccharides  150 mg Oral Daily  . levothyroxine  137 mcg Oral Q0600  . multivitamin with minerals  1 tablet Oral Daily  . pantoprazole  40 mg Oral Daily  . sodium chloride flush  3 mL Intravenous Q12H    Continuous Infusions: . sodium chloride    . furosemide (LASIX) infusion 8 mg/hr (05/28/19 1148)    PRN Meds: sodium chloride, acetaminophen **OR** acetaminophen, albuterol, ondansetron **OR** ondansetron (ZOFRAN) IV, sodium chloride, sodium chloride flush  Physical Exam Vitals and nursing note reviewed.  Constitutional:      Appearance: She is ill-appearing.  HENT:     Head: Normocephalic and atraumatic.  Cardiovascular:     Rate and Rhythm: Rhythm irregularly irregular.  Pulmonary:     Effort: No tachypnea, accessory muscle usage or respiratory distress.     Breath  sounds: Normal breath sounds.  Abdominal:     Tenderness: There is no abdominal tenderness.  Skin:    General: Skin is warm and dry.  Neurological:     Mental Status: She is easily aroused.     Comments: More awake and interactive today. Hard of hearing             Vital Signs: BP (!) 104/59 (BP Location: Right Arm)   Pulse 68   Temp 97.8 F (36.6 C) (Oral)   Resp 18   Ht 5\' 4"  (1.626  m)   Wt 85 kg   SpO2 97%   BMI 32.17 kg/m  SpO2: SpO2: 97 % O2 Device: O2 Device: Nasal Cannula O2 Flow Rate: O2 Flow Rate (L/min): 4 L/min  Intake/output summary:   Intake/Output Summary (Last 24 hours) at 05/28/2019 1231 Last data filed at 05/28/2019 0850 Gross per 24 hour  Intake 340.07 ml  Output 1225 ml  Net -884.93 ml   LBM: Last BM Date: 05/22/19 Baseline Weight: Weight: 95 kg Most recent weight: Weight: 85 kg       Palliative Assessment/Data: PPS 20%    Flowsheet Rows     Most Recent Value  Intake Tab  Referral Department  Hospitalist  Unit at Time of Referral  Med/Surg Unit  Palliative Care Primary Diagnosis  Cardiac  Date Notified  05/24/19  Palliative Care Type  New Palliative care  Reason for referral  Clarify Goals of Care  Date of Admission  05/19/19  Date first seen by Palliative Care  05/25/19  # of days Palliative referral response time  1 Day(s)  # of days IP prior to Palliative referral  5  Clinical Assessment  Palliative Performance Scale Score  20%  Psychosocial & Spiritual Assessment  Palliative Care Outcomes  Patient/Family meeting held?  Yes  Who was at the meeting?  son  Palliative Care Outcomes  Clarified goals of care, Provided end of life care assistance, Provided psychosocial or spiritual support, ACP counseling assistance      Patient Active Problem List   Diagnosis Date Noted  . Pericardial effusion   . Palliative care by specialist   . Goals of care, counseling/discussion   . Bilateral pleural effusion   . Acute on chronic respiratory  failure (HCC)   . Dysphagia   . Achalasia   . Acute congestive heart failure (HCC) 05/20/2019  . Volume overload 05/20/2019  . Acute hypoxemic respiratory failure (HCC) 05/19/2019  . Venous stasis of both lower extremities 01/13/2019  . Generalized anxiety disorder 11/07/2018  . Non-rheumatic mitral regurgitation 03/20/2017  . Thrombocytopenia (HCC) 09/14/2016  . Hemoglobin decreased 09/14/2016  . Hearing loss 01/06/2015  . Major depressive disorder, recurrent episode, moderate (HCC) 06/07/2014  . Thoracic aorta atherosclerosis (HCC) 03/04/2014  . Degenerative arthritis of thoracic spine 03/04/2014  . Osteoporosis, post-menopausal 08/12/2013  . Metabolic syndrome 06/23/2013  . Anemia, iron deficiency 03/16/2013  . Vitamin D deficiency 03/16/2013  . HTN (hypertension) 03/16/2013  . Hypothyroidism   . Diaphragmatic hernia without mention of obstruction or gangrene   . GERD (gastroesophageal reflux disease)   . Hyperlipidemia   . Prolapse of vaginal walls without mention of uterine prolapse   . Arthritis   . First degree atrioventricular block   . Symptomatic menopausal or female climacteric states   . AF (atrial fibrillation) (HCC)   . Rectal polyp   . Gastric polyp     Palliative Care Assessment & Plan   Patient Profile: 86 y.o. female  with past medical history of atrial fibrillation on Eliquis, GERD, HTN, hypothyroidism, lymphedema, anemia, CKD stage IIIB admitted on 05/19/2019 with shortness of breath, BLE edema, weight gain, and hypoxia from PCP office. Hospital admission for acute respiratory failure secondary to heart failure, preserved EF receiving IV lasix. Bilateral pleural effusions with worsening chest xray on 2/21. Large pericardial effusion evaluated by cardiology and felt to be stable and possibly chronic. AMS with negative CT head. Worsening respiratory status on 2/21 requiring NRB mask. Palliative medicine consultation for goals of care.   Assessment:  Heart  failure preserved EF Acute hypoxic respiratory failure Bilateral pleural effusions Moderate to large pericardial effusion Atrial fibrillation Urinary retention Hypernatremia AMS Achalasia Chronic lymphedema  Recommendations/Plan:  Patient does not have documented living will. Son Ethelene Browns) is default HCPOA.  DNR code status  Continue current plan of care and medical management. Discussed with Dr. Sherryll Burger. Patient more awake and interactive today. Continue lasix infusion and aggressive diuresis through the weekend. Further GOC/hospice discussions pending clinical course.  PMT provider [facility] Monday, March 1st and plans to f/u next week with patient/family.   Code Status: DNR   Code Status Orders  (From admission, onward)         Start     Ordered   05/19/19 1629  Do not attempt resuscitation (DNR)  Continuous    Question Answer Comment  In the event of cardiac or respiratory ARREST Do not call a "code blue"   In the event of cardiac or respiratory ARREST Do not perform Intubation, CPR, defibrillation or ACLS   In the event of cardiac or respiratory ARREST Use medication by any route, position, wound care, and other measures to relive pain and suffering. May use oxygen, suction and manual treatment of airway obstruction as needed for comfort.      05/19/19 1630        Code Status History    This patient has a current code status but no historical code status.   Advance Care Planning Activity       Prognosis:  Guarded  Discharge Planning:  To Be Determined  Care plan was discussed with RN , Dr. Sherryll Burger  Thank you for allowing the Palliative Medicine Team to assist in the care of this patient.   Time In: 1230 Time Out: 1245 Total Time 15 Prolonged Time Billed no      Greater than 50%  of this time was spent counseling and coordinating care related to the above assessment and plan.  Vennie Homans, DNP, FNP-C Palliative Medicine Team  Phone:  [phone] Fax: [phone]  Please contact Palliative Medicine Team phone at [phone] for questions and concerns.

## 2019-05-29 LAB — CBC
HCT: 40.6 % (ref 36.0–46.0)
Hemoglobin: 11.8 g/dL — ABNORMAL LOW (ref 12.0–15.0)
MCH: 31 pg (ref 26.0–34.0)
MCHC: 29.1 g/dL — ABNORMAL LOW (ref 30.0–36.0)
MCV: 106.6 fL — ABNORMAL HIGH (ref 80.0–100.0)
Platelets: 87 10*3/uL — ABNORMAL LOW (ref 150–400)
RBC: 3.81 MIL/uL — ABNORMAL LOW (ref 3.87–5.11)
RDW: 15.7 % — ABNORMAL HIGH (ref 11.5–15.5)
WBC: 4.3 10*3/uL (ref 4.0–10.5)
nRBC: 0 % (ref 0.0–0.2)

## 2019-05-29 LAB — BASIC METABOLIC PANEL
Anion gap: 11 (ref 5–15)
BUN: 64 mg/dL — ABNORMAL HIGH (ref 8–23)
CO2: 43 mmol/L — ABNORMAL HIGH (ref 22–32)
Calcium: 9.6 mg/dL (ref 8.9–10.3)
Chloride: 98 mmol/L (ref 98–111)
Creatinine, Ser: 1.41 mg/dL — ABNORMAL HIGH (ref 0.44–1.00)
GFR calc Af Amer: 39 mL/min — ABNORMAL LOW (ref >60)
GFR calc non Af Amer: 34 mL/min — ABNORMAL LOW (ref >60)
Glucose, Bld: 86 mg/dL (ref 70–99)
Potassium: 4.2 mmol/L (ref 3.5–5.1)
Sodium: 152 mmol/L — ABNORMAL HIGH (ref 135–145)

## 2019-05-29 LAB — MAGNESIUM: Magnesium: 2.3 mg/dL (ref 1.7–2.4)

## 2019-05-29 NOTE — Progress Notes (Signed)
Physical Therapy Treatment Patient Details Name: [name] MRN: [id number] DOB: 09-05-1932 Today's Date: 05/29/2019    History of Present Illness [name] is a 86 y.o. female with medical history significant for atrial fibrillation on Eliquis, GERD, hypertension, hypothyroidism, lymphedema, chronic anemia, and CKD stage IIIb who presented to her PCP office with worsening bilateral lower extremity swelling and some shortness of breath with exertion that has been persisting over the last 1 month.  She also states that she has gained at least 10 pounds over the last few months and recalls her last weight being approximately 200 pounds.  She was able to walk into the office with her O2 saturations between 60th and 80th percentile on room air.  She was noted to be cyanotic with her lips and fingers.  She was placed on 2 L nasal cannula oxygen with increase in oxygen to 94%.  She was sent to the ED for further evaluation.  She denies any orthopnea or paroxysmal nocturnal dyspnea.  No chest pain, fevers, or chills noted.  She has had a mild nonproductive cough for the past few days.    PT Comments    Patient able to transition to seated EOB with min assist but transition is very slow and labored. Upon seated, she is able to complete exercises EOB with demonstration prior due to [facility] and difficulty with verbal cueing. She is able to transfer to standing from elevated bed with use of RW but states she feels too dizzy to ambulate even after several minutes of standing. Her O2 sat stays between 92-98% on 4L. She is able to return to supine with min assist. Patient limited by fatigue and impaired activity tolerance. Patient will benefit from continued physical therapy in hospital and recommended venue below to increase strength, balance, endurance for safe ADLs and gait.   Follow Up Recommendations  SNF     Equipment Recommendations  None recommended by PT    Recommendations for Other Services        Precautions / Restrictions Precautions Precautions: Fall Restrictions Weight Bearing Restrictions: No    Mobility  Bed Mobility Overal bed mobility: Needs Assistance Bed Mobility: Supine to Sit;Sit to Supine     Supine to sit: Min assist;HOB elevated Sit to supine: Min assist   General bed mobility comments: slow, labored, min verbal cueing; O2 sat stays between 92-98% on 4L O2  Transfers Overall transfer level: Needs assistance Equipment used: Rolling walker (2 wheeled) Transfers: Sit to/from Stand Sit to Stand: Mod assist Stand pivot transfers: From elevated surface       General transfer comment: Patient able to transition to standing using RW but is unsteady upon standing, requires assist for balance and safety due to weakness  Ambulation/Gait                 Stairs             Wheelchair Mobility    Modified Rankin (Stroke Patients Only)       Balance Overall balance assessment: Needs assistance Sitting-balance support: Feet supported;No upper extremity supported Sitting balance-Leahy Scale: Fair Sitting balance - Comments: seated EOB                                    Cognition Arousal/Alertness: Awake/alert;Lethargic Behavior During Therapy: WFL for tasks assessed/performed;Flat affect Overall Cognitive Status: No family/caregiver present to determine baseline cognitive functioning  General Comments: Patient reports grogginess throughout session, appears lethargic at times during session, she has occasional difficulty finding words      Exercises      General Comments        Pertinent Vitals/Pain Pain Assessment: No/denies pain    Home Living Family/patient expects to be discharged to:: Private residence Living Arrangements: Alone Available Help at Discharge: (states family does not come around much anymore) Type of Home: House Home Access: Ramped entrance    Home Layout: One level Home Equipment: [place] - single point      Prior Function Level of Independence: Needs assistance  Gait / Transfers Assistance Needed: patient states community ambulator with [facility], drives ADL's / Homemaking Assistance Needed: patient states she needs assistance but does not have anyone who can help her     PT Goals (current goals can now be found in the care plan section) Acute Rehab PT Goals Patient Stated Goal: Return home PT Goal Formulation: With patient Time For Goal Achievement: 06/04/19 Potential to Achieve Goals: Fair Progress towards PT goals: Progressing toward goals    Frequency    Min 3X/week      PT Plan Current plan remains appropriate    Co-evaluation              AM-PAC PT "6 Clicks" Mobility   Outcome Measure  Help needed turning from your back to your side while in a flat bed without using bedrails?: A Little Help needed moving from lying on your back to sitting on the side of a flat bed without using bedrails?: A Little Help needed moving to and from a bed to a chair (including a wheelchair)?: A Lot Help needed standing up from a chair using your arms (e.g., wheelchair or bedside chair)?: A Lot Help needed to walk in hospital room?: A Lot Help needed climbing 3-5 steps with a railing? : A Lot 6 Click Score: 14    End of Session Equipment Utilized During Treatment: Gait belt;Oxygen Activity Tolerance: Patient limited by fatigue Patient left: in bed;with bed alarm set;with call bell/phone within reach Nurse Communication: Mobility status PT Visit Diagnosis: Unsteadiness on feet (R26.81);Other abnormalities of gait and mobility (R26.89);Muscle weakness (generalized) (M62.81)     Time: 6967-8938 PT Time Calculation (min) (ACUTE ONLY): 24 min  Charges:  $Therapeutic Exercise: 8-22 mins $Therapeutic Activity: 8-22 mins                     3:45 PM, 05/29/19 Mearl Latin PT, DPT Physical Therapist at [facility]

## 2019-05-29 NOTE — Care Management Important Message (Signed)
Important Message  Patient Details  Name: [name] MRN: [id number] Date of Birth: [birth year]   Medicare Important Message Given:  Yes     Corey Harold 05/29/2019, 2:29 PM

## 2019-05-29 NOTE — Progress Notes (Signed)
PROGRESS NOTE    [name]  ZOX:[id number] DOB: [birth year] DOA: 05/19/2019 PCP: Raliegh Ip, DO   Brief Narrative:  86 year old female with A. fib on apixaban, GERD, hypertension, hypothyroidism, chronic lymphedema and chronic anemia, stage IIIb CKD presented with increasing lower extremity edema and shortness of breath with exertion progressive over the past 4 to 6 weeks. Patient also recalled a 10 pound weight gain. Patient was sent from PCP office with concerns for new findings of CHF.  2/24:Patient noted to have worsening pulmonary edema on chest x-ray discussed with radiology. No significant pleural effusions were need for thoracentesis at this time noted. She continues to be quite lethargic and has diminished appetite. Appreciate palliative care involvement. We will try Lasix infusion today and see if there is any significant improvement overnight with further diuresis, otherwise may need to consider comfort care.  2/25: Patient was started on Lasix infusion yesterday with some minimal diuresis of slightly less than 1 L, but she seems more awake and interactive today.  Will increase Lasix infusion rate and see how much she begins to diurese by tomorrow.  Continue to follow labs.  She will likely need close monitoring through the weekend to see whether or not she improves from the current treatment or if she will need hospice.  2/26: Patient appears to diurese well with urine output noted while on increased Lasix infusion.  She has had quite a bit of p.o. intake and this will be controlled and her new heart healthy diet.  Plan to continue close monitoring of volume status.  Assessment & Plan:   Principal Problem:   Acute hypoxemic respiratory failure (HCC) Active Problems:   Hypothyroidism   GERD (gastroesophageal reflux disease)   Hyperlipidemia   AF (atrial fibrillation) (HCC)   Vitamin D deficiency   Metabolic syndrome   Venous stasis of both lower  extremities   Acute congestive heart failure (HCC)   Volume overload   Dysphagia   Achalasia   Palliative care by specialist   Goals of care, counseling/discussion   Bilateral pleural effusion   Acute on chronic respiratory failure (HCC)   Pericardial effusion   1. Heart failure, preserved EF-continue IV infusion of Lasix at 8 mg/h and monitor I's and O's.  She appears to be more alert and awake today.  Fluid restriction of 1500 mL daily with heart healthy diet. 2. Hypernatremia -this is ongoing.  Continue diuresis and monitor. 3. Acute hypoxic respiratory failure likely exacerbated bysignificant pulmonary edema. Continue oxygen support and try Lasix diuresis. 4. Small bilateral pleural effusions. Discussed with radiology that thoracentesis would not be of benefit. Try aggressive diuresis to see if improvement can be obtained. 5. Permanent atrial fibrillation-rate is well controlled. Continue apixaban for full anticoagulation. 6. Thrombocytopenia-following closely as patient is fully anticoagulated with apixaban. Has been stable around 80.Repeat CBC in a.m. 7. Urinary retention - we attempted I/O catheterization for 24 hours andpatientcontinuedto have urinary retention, foley placed. Continue Foley catheter due to aggressive diuresis. 8. Large pericardial effusion - noted on echo, no tamponade physiology, spoke with cardiologist Dr. Purvis Sheffield, recommended repeating limited Echo on 05/23/19 to reassess and itwas repeated andappears to be stable and possibly chronic.  9. AMS - CT head without contrast with no acute findings.  This appears to be improving with diuresis.  We will continue to follow. 10. Achalasia - noted on esophagram, GI saw her and shewill need to be onfull liquid diet indefinitely or at least until she has closefollow  [facility] 1-2 weeks post discharge. See GI consult notes. 11. Stage 3b CKD-currently stable and improving. Try IV Lasix infusion  to see if further diuresis may help her renal function further. 12. Essential hypertension-patient's blood pressures are stable and we continue to follow. 13. Hypothyroidism-on levothyroxine. 14. GERD-Protonix ordered for GI protection. 15. Chronic lymphedema-patient has had some improvement in edema with diuresis but unable to tolerate TED hoses for long periods of time.  16. Goals of care continue ongoing discussions with palliative medicine team as patient continues to decline and has poor prognosis low likelihood of meaningful recovery I have been discussing this with son over the weekend and updating him on this reality. I appreciate the assistance of the palliative medicine team. 17. DNR / DNI   DVT prophylaxis:Apixaban Code Status:DNR Family Communication:Discussed with son Ethelene Browns at bedside on 2/24 Disposition Plan: Continue ongoing IV Lasix infusion and monitor diuresis.  She appears to still show some signs of improvement.  Appreciate palliative evaluation.  Plan to wean oxygen to room air once able.   Consultants:  Palliative care  Procedures:  See below  Antimicrobials:   None  Subjective: Patient seen and evaluated today with no new acute complaints or concerns.  She has had increased oral intake over the last 24 hours.  And appears to be diuresing well.  We will also need some fluid restriction to assist further with diuresis and continue monitoring.  Objective: Vitals:   05/28/19 2053 05/29/19 0500 05/29/19 0520 05/29/19 0801  BP: 133/72  130/60   Pulse: 71  78   Resp: 20  20   Temp: 98.2 F (36.8 C)  98 F (36.7 C)   TempSrc: Oral     SpO2: 97%  90% 90%  Weight:  81.3 kg    Height:        Intake/Output Summary (Last 24 hours) at 05/29/2019 1219 Last data filed at 05/29/2019 0800 Gross per 24 hour  Intake 2550.25 ml  Output 3275 ml  Net -724.75 ml   Filed Weights   05/26/19 0500 05/28/19 0500 05/29/19 0500  Weight: 86.6 kg 85 kg 81.3  kg    Examination:  General exam: Appears calm and comfortable  Respiratory system: Clear to auscultation. Respiratory effort normal.  Continues on 4 L nasal cannula. Cardiovascular system: S1 & S2 heard, RRR. No JVD, murmurs, rubs, gallops or clicks. No pedal edema. Gastrointestinal system: Abdomen is nondistended, soft and nontender. No organomegaly or masses felt. Normal bowel sounds heard. Central nervous system: Alert and awake Extremities: Symmetric 5 x 5 power. Skin: No rashes, lesions or ulcers Psychiatry: Flat affect    Data Reviewed: I have personally reviewed following labs and imaging studies  CBC: Recent Labs  Lab 05/23/19 0624 05/25/19 0500 05/29/19 0415  WBC 4.1 3.6* 4.3  HGB 11.6* 11.5* 11.8*  HCT 40.7 39.7 40.6  MCV 108.2* 107.9* 106.6*  PLT 84* 79* 87*   Basic Metabolic Panel: Recent Labs  Lab 05/23/19 0624 05/23/19 0624 05/25/19 0500 05/26/19 0433 05/27/19 0430 05/28/19 0427 05/29/19 0415  NA 142   < > 147* 150* 149* 150* 152*  K 4.7   < > 4.9 4.8 4.8 4.9 4.2  CL 96*   < > 99 101 101 101 98  CO2 36*   < > 38* 38* 40* 40* 43*  GLUCOSE 90   < > 103* 105* 123* 82 86  BUN 38*   < > 59* 60* 57* 67* 64*  CREATININE  1.46*   < > 1.52* 1.34* 1.27* 1.34* 1.41*  CALCIUM 9.2   < > 9.4 9.6 9.4 9.4 9.6  MG 2.3  --   --   --   --  2.6* 2.3   < > = values in this interval not displayed.   GFR: Estimated Creatinine Clearance: 30.1 mL/min (A) (by C-G formula based on SCr of 1.41 mg/dL (H)). Liver Function Tests: No results for input(s): AST, ALT, ALKPHOS, BILITOT, PROT, ALBUMIN in the last 168 hours. No results for input(s): LIPASE, AMYLASE in the last 168 hours. No results for input(s): AMMONIA in the last 168 hours. Coagulation Profile: No results for input(s): INR, PROTIME in the last 168 hours. Cardiac Enzymes: No results for input(s): CKTOTAL, CKMB, CKMBINDEX, TROPONINI in the last 168 hours. BNP (last 3 results) No results for input(s): PROBNP in  the last 8760 hours. HbA1C: No results for input(s): HGBA1C in the last 72 hours. CBG: No results for input(s): GLUCAP in the last 168 hours. Lipid Profile: No results for input(s): CHOL, HDL, LDLCALC, TRIG, CHOLHDL, LDLDIRECT in the last 72 hours. Thyroid Function Tests: No results for input(s): TSH, T4TOTAL, FREET4, T3FREE, THYROIDAB in the last 72 hours. Anemia Panel: No results for input(s): VITAMINB12, FOLATE, FERRITIN, TIBC, IRON, RETICCTPCT in the last 72 hours. Sepsis Labs: No results for input(s): PROCALCITON, LATICACIDVEN in the last 168 hours.  Recent Results (from the past 240 hour(s))  Respiratory Panel by RT PCR (Flu A&B, Covid) - Nasopharyngeal Swab     Status: None   Collection Time: 05/19/19 12:27 PM   Specimen: Nasopharyngeal Swab  Result Value Ref Range Status   SARS Coronavirus 2 by RT PCR NEGATIVE NEGATIVE Final    Comment: (NOTE) SARS-CoV-2 target nucleic acids are NOT DETECTED. The SARS-CoV-2 RNA is generally detectable in upper respiratoy specimens during the acute phase of infection. The lowest concentration of SARS-CoV-2 viral copies this assay can detect is 131 copies/mL. A negative result does not preclude SARS-Cov-2 infection and should not be used as the sole basis for treatment or other patient management decisions. A negative result may occur with  improper specimen collection/handling, submission of specimen other than nasopharyngeal swab, presence of viral mutation(s) within the areas targeted by this assay, and inadequate number of viral copies (<131 copies/mL). A negative result must be combined with clinical observations, patient history, and epidemiological information. The expected result is Negative. Fact Sheet for Patients:  https://www.moore.com/ Fact Sheet for Healthcare Providers:  https://www.young.biz/ This test is not yet ap proved or cleared by the [place] FDA and  has been authorized  for detection and/or diagnosis of SARS-CoV-2 by FDA under an Emergency Use Authorization (EUA). This EUA will remain  in effect (meaning this test can be used) for the duration of the COVID-19 declaration under Section 564(b)(1) of the Act, 21 U.S.C. section 360bbb-3(b)(1), unless the authorization is terminated or revoked sooner.    Influenza A by PCR NEGATIVE NEGATIVE Final   Influenza B by PCR NEGATIVE NEGATIVE Final    Comment: (NOTE) The Xpert Xpress SARS-CoV-2/FLU/RSV assay is intended as an aid in  the diagnosis of influenza from Nasopharyngeal swab specimens and  should not be used as a sole basis for treatment. Nasal washings and  aspirates are unacceptable for Xpert Xpress SARS-CoV-2/FLU/RSV  testing. Fact Sheet for Patients: https://www.moore.com/ Fact Sheet for Healthcare Providers: https://www.young.biz/ This test is not yet approved or cleared by the [place] FDA and  has been authorized for detection and/or diagnosis  of SARS-CoV-2 by  FDA under an Emergency Use Authorization (EUA). This EUA will remain  in effect (meaning this test can be used) for the duration of the  Covid-19 declaration under Section 564(b)(1) of the Act, 21  U.S.C. section 360bbb-3(b)(1), unless the authorization is  terminated or revoked. Performed at [facility], [address] [place], [place] [address]          Radiology Studies: No results found.      Scheduled Meds: . apixaban  5 mg Oral BID  . calcium-vitamin D  1 tablet Oral Q breakfast  . Chlorhexidine Gluconate Cloth  6 each Topical Daily  . cholecalciferol  2,000 Units Oral Daily  . ipratropium-albuterol  3 mL Nebulization Q6H  . iron polysaccharides  150 mg Oral Daily  . levothyroxine  137 mcg Oral Q0600  . multivitamin with minerals  1 tablet Oral Daily  . pantoprazole  40 mg Oral Daily  . sodium chloride flush  3 mL Intravenous Q12H   Continuous Infusions: . sodium  chloride    . furosemide (LASIX) infusion 8 mg/hr (05/29/19 0536)     LOS: 10 days    Time spent: 30 minutes    Yalena Colon Darleen Crocker, DO [facility] Pager [phone]  If 7PM-7AM, please contact night-coverage [email or web address] Password [facility] 05/29/2019, 12:19 PM

## 2019-05-30 LAB — BASIC METABOLIC PANEL
Anion gap: 11 (ref 5–15)
BUN: 55 mg/dL — ABNORMAL HIGH (ref 8–23)
CO2: 48 mmol/L — ABNORMAL HIGH (ref 22–32)
Calcium: 9.4 mg/dL (ref 8.9–10.3)
Chloride: 93 mmol/L — ABNORMAL LOW (ref 98–111)
Creatinine, Ser: 1.23 mg/dL — ABNORMAL HIGH (ref 0.44–1.00)
GFR calc Af Amer: 46 mL/min — ABNORMAL LOW (ref >60)
GFR calc non Af Amer: 40 mL/min — ABNORMAL LOW (ref >60)
Glucose, Bld: 97 mg/dL (ref 70–99)
Potassium: 3.5 mmol/L (ref 3.5–5.1)
Sodium: 152 mmol/L — ABNORMAL HIGH (ref 135–145)

## 2019-05-30 LAB — MAGNESIUM: Magnesium: 2.1 mg/dL (ref 1.7–2.4)

## 2019-05-30 MED ORDER — PRO-STAT SUGAR FREE PO LIQD
30.0000 mL | Freq: Two times a day (BID) | ORAL | Status: DC
  Administered 2019-05-30 – 2019-06-02 (×7): 30 mL via ORAL
  Filled 2019-05-30 (×6): qty 30

## 2019-05-30 MED ORDER — POTASSIUM CHLORIDE CRYS ER 20 MEQ PO TBCR
40.0000 meq | EXTENDED_RELEASE_TABLET | Freq: Every day | ORAL | Status: DC
  Administered 2019-05-30 – 2019-06-02 (×4): 40 meq via ORAL
  Filled 2019-05-30 (×5): qty 2

## 2019-05-30 MED ORDER — IPRATROPIUM-ALBUTEROL 0.5-2.5 (3) MG/3ML IN SOLN
3.0000 mL | Freq: Three times a day (TID) | RESPIRATORY_TRACT | Status: DC
  Administered 2019-05-30 – 2019-06-02 (×11): 3 mL via RESPIRATORY_TRACT
  Filled 2019-05-30 (×10): qty 3

## 2019-05-30 MED ORDER — ENSURE ENLIVE PO LIQD
237.0000 mL | Freq: Two times a day (BID) | ORAL | Status: DC
  Administered 2019-05-30 – 2019-05-31 (×2): 237 mL via ORAL

## 2019-05-30 NOTE — Progress Notes (Signed)
PROGRESS NOTE    [name]  TZG:[id number] DOB: [birth year] DOA: 05/19/2019 PCP: Raliegh Ip, DO   Brief Narrative:  86 year old female with A. fib on apixaban, GERD, hypertension, hypothyroidism, chronic lymphedema and chronic anemia, stage IIIb CKD presented with increasing lower extremity edema and shortness of breath with exertion progressive over the past 4 to 6 weeks. Patient also recalled a 10 pound weight gain. Patient was sent from PCP office with concerns for new findings of CHF.  2/24:Patient noted to have worsening pulmonary edema on chest x-ray discussed with radiology. No significant pleural effusions were need for thoracentesis at this time noted. She continues to be quite lethargic and has diminished appetite. Appreciate palliative care involvement. We will try Lasix infusion today and see if there is any significant improvement overnight with further diuresis, otherwise may need to consider comfort care.  2/25:Patient was started on Lasix infusion yesterday with some minimal diuresis of slightly less than 1 L, but she seems more awake and interactive today. Will increase Lasix infusion rate and see how much she begins to diurese by tomorrow. Continue to follow labs. She will likely need close monitoring through the weekend to see whether or not she improves from the current treatment or if she will need hospice.  2/26: Patient appears to diurese well with urine output noted while on increased Lasix infusion.  She has had quite a bit of p.o. intake and this will be controlled and her new heart healthy diet.  Plan to continue close monitoring of volume status.  2/27: Patient continues to diurese quite well with over 3 L of net diuresis in the last 24 hours documented.  Her weight appears to have come down to 179 pounds versus 209 pounds on admission.  Renal function is stable.  Continue to supplement potassium with ongoing diuresis and monitor a.m.  labs.  Assessment & Plan:   Principal Problem:   Acute hypoxemic respiratory failure (HCC) Active Problems:   Hypothyroidism   GERD (gastroesophageal reflux disease)   Hyperlipidemia   AF (atrial fibrillation) (HCC)   Vitamin D deficiency   Metabolic syndrome   Venous stasis of both lower extremities   Acute congestive heart failure (HCC)   Volume overload   Dysphagia   Achalasia   Palliative care by specialist   Goals of care, counseling/discussion   Bilateral pleural effusion   Acute on chronic respiratory failure (HCC)   Pericardial effusion   1. Heart failure, preserved EF-continue IV infusion of Lasix at 8 mg/h and monitor I's and O's. She appears to be more alert and awake today.  Fluid restriction of 1500 mL daily with heart healthy diet. 2. Hypernatremia -this is ongoing.  Continue diuresis and monitor. 3. Acute hypoxic respiratory failure likely exacerbated bysignificant pulmonary edema. Continue oxygen support and try Lasix diuresis. 4. Small bilateral pleural effusions. Discussed with radiology that thoracentesis would not be of benefit. Try aggressive diuresis to see if improvement can be obtained. 5. Permanent atrial fibrillation-rate is well controlled. Continue apixaban for full anticoagulation. 6. Thrombocytopenia-following closely as patient is fully anticoagulated with apixaban. Has been stable around 80.Repeat CBC in a.m. 7. Urinary retention - we attempted I/O catheterization for 24 hours andpatientcontinuedto have urinary retention, foley placed. Continue Foley catheter due to aggressive diuresis. 8. Large pericardial effusion - noted on echo, no tamponade physiology, spoke with cardiologist Dr. Purvis Sheffield, recommended repeating limited Echo on 05/23/19 to reassess and itwas repeated andappears to be stable and possibly chronic.  9.  AMS - CT head without contrast with no acute findings.This appears to be improving with diuresis. We will continue  to follow. 10. Achalasia - noted on esophagram, GI saw her and shewill need to be onfull liquid diet indefinitely or at least until she has closefollow up with [facility] in 1-2 weeks post discharge. See GI consult notes. 11. Stage 3b CKD-currently stable and improving. Try IV Lasix infusion to see if further diuresis may help her renal function further. 12. Essential hypertension-patient's blood pressures are stable and we continue to follow. 13. Hypothyroidism-on levothyroxine. 14. GERD-Protonix ordered for GI protection. 15. Chronic lymphedema-patient has had some improvement in edema with diuresis but unable to tolerate TED hoses for long periods of time.  16. Goals of care continue ongoing discussions with palliative medicine team as patient continues to decline and has poor prognosis low likelihood of meaningful recovery I have been discussing this with son over the weekend and updating him on this reality. I appreciate the assistance of the palliative medicine team. 17. DNR / DNI   DVT prophylaxis:Apixaban Code Status:DNR Family Communication:Discussed with son Ethelene Browns at bedside on 2/24 Disposition Plan:Continue ongoing IV Lasix infusion and monitor ongoing diuresis. She appears to still show signs of ongoing improvement. Appreciate palliative evaluation.  Plan to wean oxygen to room air once able.   Consultants:  Palliative care  Procedures:  See below  Antimicrobials:   None  Subjective: Patient seen and evaluated today with no new acute complaints or concerns. No acute concerns or events noted overnight.  She appears to be diuresing well on the Lasix infusion.  Objective: Vitals:   05/29/19 2022 05/29/19 2138 05/30/19 0500 05/30/19 0530  BP:  124/71  124/74  Pulse: 63 71  70  Resp: 16 18  18   Temp:  (!) 97.5 F (36.4 C)  97.6 F (36.4 C)  TempSrc:  Oral  Oral  SpO2: 93% 99%  98%  Weight:   81.2 kg   Height:        Intake/Output Summary  (Last 24 hours) at 05/30/2019 0943 Last data filed at 05/30/2019 0940 Gross per 24 hour  Intake 3 ml  Output 4675 ml  Net -4672 ml   Filed Weights   05/28/19 0500 05/29/19 0500 05/30/19 0500  Weight: 85 kg 81.3 kg 81.2 kg    Examination:  General exam: Appears calm and comfortable  Respiratory system: Clear to auscultation. Respiratory effort normal.  Currently on 3 L nasal cannula oxygen. Cardiovascular system: S1 & S2 heard, RRR. No JVD, murmurs, rubs, gallops or clicks.  Gastrointestinal system: Abdomen is nondistended, soft and nontender. No organomegaly or masses felt. Normal bowel sounds heard. Central nervous system: Alert and awake Extremities: Ongoing pitting edema noted bilaterally. Skin: No rashes, lesions or ulcers Psychiatry: Flat affect    Data Reviewed: I have personally reviewed following labs and imaging studies  CBC: Recent Labs  Lab 05/25/19 0500 05/29/19 0415  WBC 3.6* 4.3  HGB 11.5* 11.8*  HCT 39.7 40.6  MCV 107.9* 106.6*  PLT 79* 87*   Basic Metabolic Panel: Recent Labs  Lab 05/26/19 0433 05/27/19 0430 05/28/19 0427 05/29/19 0415 05/30/19 0638  NA 150* 149* 150* 152* 152*  K 4.8 4.8 4.9 4.2 3.5  CL 101 101 101 98 93*  CO2 38* 40* 40* 43* 48*  GLUCOSE 105* 123* 82 86 97  BUN 60* 57* 67* 64* 55*  CREATININE 1.34* 1.27* 1.34* 1.41* 1.23*  CALCIUM 9.6 9.4 9.4 9.6 9.4  MG  --   --  2.6* 2.3 2.1   GFR: Estimated Creatinine Clearance: 34.5 mL/min (A) (by C-G formula based on SCr of 1.23 mg/dL (H)). Liver Function Tests: No results for input(s): AST, ALT, ALKPHOS, BILITOT, PROT, ALBUMIN in the last 168 hours. No results for input(s): LIPASE, AMYLASE in the last 168 hours. No results for input(s): AMMONIA in the last 168 hours. Coagulation Profile: No results for input(s): INR, PROTIME in the last 168 hours. Cardiac Enzymes: No results for input(s): CKTOTAL, CKMB, CKMBINDEX, TROPONINI in the last 168 hours. BNP (last 3 results) No results  for input(s): PROBNP in the last 8760 hours. HbA1C: No results for input(s): HGBA1C in the last 72 hours. CBG: No results for input(s): GLUCAP in the last 168 hours. Lipid Profile: No results for input(s): CHOL, HDL, LDLCALC, TRIG, CHOLHDL, LDLDIRECT in the last 72 hours. Thyroid Function Tests: No results for input(s): TSH, T4TOTAL, FREET4, T3FREE, THYROIDAB in the last 72 hours. Anemia Panel: No results for input(s): VITAMINB12, FOLATE, FERRITIN, TIBC, IRON, RETICCTPCT in the last 72 hours. Sepsis Labs: No results for input(s): PROCALCITON, LATICACIDVEN in the last 168 hours.  No results found for this or any previous visit (from the past 240 hour(s)).       Radiology Studies: No results found.      Scheduled Meds: . apixaban  5 mg Oral BID  . calcium-vitamin D  1 tablet Oral Q breakfast  . Chlorhexidine Gluconate Cloth  6 each Topical Daily  . cholecalciferol  2,000 Units Oral Daily  . ipratropium-albuterol  3 mL Nebulization TID  . iron polysaccharides  150 mg Oral Daily  . levothyroxine  137 mcg Oral Q0600  . multivitamin with minerals  1 tablet Oral Daily  . pantoprazole  40 mg Oral Daily  . potassium chloride  40 mEq Oral Daily  . sodium chloride flush  3 mL Intravenous Q12H   Continuous Infusions: . sodium chloride    . furosemide (LASIX) infusion 8 mg/hr (05/29/19 0536)     LOS: 11 days    Time spent: 30 minutes    Muneer Leider Darleen Crocker, DO [facility] Pager [phone]  If 7PM-7AM, please contact night-coverage [email or web address] Password [facility] 05/30/2019, 9:43 AM

## 2019-05-30 NOTE — Progress Notes (Signed)
Initial Nutrition Assessment  DOCUMENTATION CODES:   Obesity unspecified  INTERVENTION:  Ensure Enlive po BID, each supplement provides 350 kcal and 20 grams of protein  Pro-stat 30 ml BID, each supplement provides 100 kcal and 15 grams of protein  Increased refeeding risk, recommend continuing to monitor potassium and magnesium as well as phosphorous   NUTRITION DIAGNOSIS:   Inadequate oral intake related to chronic illness(CHF) as evidenced by meal completion < 25%.    GOAL:   Patient will meet greater than or equal to 90% of their needs    MONITOR:   Labs, I & O's, Skin, Supplement acceptance, Weight trends, PO intake  REASON FOR ASSESSMENT:   Consult Assessment of nutrition requirement/status  ASSESSMENT:  RD working remotely.  86 year old female with past medical history of esophagitis, HLD, arthritis, atrial fibrillation, HTN, GERD, hypothyroidism, chronic lymphedema, CKD stage IIIb, chronic anemia presented from PCP office with concerns of new findings of CHF. Patient reports 10 lb weight gain with increasing lower extremity edema and SOB with exertion over the past 4 to 6 weeks.  Patient admitted on 2/16 for acute hypoxemic respiratory failure.   Per notes: - continue K supplement with ongoing diuresis - 3 L net diuresis x 24 hrs - signs of continued improvement  Patient with significantly poor po intake during admission, eating 0-10% x 7 documented meals from 2/19 - 2/26. Per chart, patient noted with improved po intake after breakfast yesterday. Patient is at increased risk refeeding, recommend monitoring Mg and P.   Given history of present illness and poor po during admission pt at risk for acute malnutrition, will provide nutrition supplements to aid with estimated needs.   Mild pitting BUE; Moderate pitting BLE edema noted.  Current wt 178.64 lbs Weights down 32 lbs since admission, secondary to volume status, ongoing Lasix infusion d/t worsening  pulmonary edema. Per history, stable 202 - 205 lbs in the previous 6 months.  I/Os: -7522 ml since admit   -3552 ml x 24 hrs UOP: 3675 ml x 24 hrs Medications reviewed and include: Oscal with D, D3, Niferex, MVI, Protonix, Klor-con  Labs: BG 97,86,82 x 24 hrs, Na 152 (H), BUN 55 (H), Cr 1.23 (H) K/Mg - WNL  NUTRITION - FOCUSED PHYSICAL EXAM: Unable to complete at this time, RD working remotely.   Diet Order:   Diet Order            Diet Heart Room service appropriate? Yes; Fluid consistency: Thin; Fluid restriction: 1500 mL Fluid  Diet effective now              EDUCATION NEEDS:   Not appropriate for education at this time  Skin:  Skin Assessment: Skin Integrity Issues: Skin Integrity Issues:: Other (Comment) Other: cellulitis; BLE, MASD; breast;abdomen  Last BM:  2/26  Height:   Ht Readings from Last 1 Encounters:  05/19/19 5\' 4"  (1.626 m)    Weight:   Wt Readings from Last 1 Encounters:  05/30/19 81.2 kg    Ideal Body Weight:  54.5 kg  BMI:  Body mass index is 30.73 kg/m.  Estimated Nutritional Needs:   Kcal:  1600-1800  Protein:  80-90  Fluid:  1.5 L/day per MD   06/01/19, RD, LDN Clinical Nutrition After Hours/Weekend Pager # in Amion

## 2019-05-31 LAB — BASIC METABOLIC PANEL
BUN: 47 mg/dL — ABNORMAL HIGH (ref 8–23)
CO2: >50 mmol/L — ABNORMAL HIGH (ref 22–32)
Calcium: 9.3 mg/dL (ref 8.9–10.3)
Chloride: 88 mmol/L — ABNORMAL LOW (ref 98–111)
Creatinine, Ser: 1.18 mg/dL — ABNORMAL HIGH (ref 0.44–1.00)
GFR calc Af Amer: 49 mL/min — ABNORMAL LOW (ref >60)
GFR calc non Af Amer: 42 mL/min — ABNORMAL LOW (ref >60)
Glucose, Bld: 103 mg/dL — ABNORMAL HIGH (ref 70–99)
Potassium: 3.5 mmol/L (ref 3.5–5.1)
Sodium: 150 mmol/L — ABNORMAL HIGH (ref 135–145)

## 2019-05-31 LAB — MAGNESIUM: Magnesium: 2 mg/dL (ref 1.7–2.4)

## 2019-05-31 LAB — BLOOD GAS, VENOUS
Bicarbonate: 52.6 mmol/L — ABNORMAL HIGH (ref 20.0–28.0)
FIO2: 32
O2 Saturation: 73.5 %
Patient temperature: 37
pCO2, Ven: 85.9 mmHg (ref 44.0–60.0)
pH, Ven: 7.442 — ABNORMAL HIGH (ref 7.250–7.430)
pO2, Ven: 40.3 mmHg (ref 32.0–45.0)

## 2019-05-31 MED ORDER — ALUM & MAG HYDROXIDE-SIMETH 200-200-20 MG/5ML PO SUSP
30.0000 mL | ORAL | Status: DC | PRN
  Administered 2019-05-31: 30 mL via ORAL
  Filled 2019-05-31: qty 30

## 2019-05-31 NOTE — Progress Notes (Signed)
Critical lab- PCO2 -85.9  Dr. Clelia Croft notified Maurilio Lovely RN

## 2019-05-31 NOTE — Progress Notes (Signed)
PROGRESS NOTE    [name]  WNU:[id number] DOB: 12-10-1932 DOA: 05/19/2019 PCP: Raliegh Ip, DO   Brief Narrative:  86 year old female with A. fib on apixaban, GERD, hypertension, hypothyroidism, chronic lymphedema and chronic anemia, stage IIIb CKD presented with increasing lower extremity edema and shortness of breath with exertion progressive over the past 4 to 6 weeks. Patient also recalled a 10 pound weight gain. Patient was sent from PCP office with concerns for new findings of CHF.  2/24:Patient noted to have worsening pulmonary edema on chest x-ray discussed with radiology. No significant pleural effusions were need for thoracentesis at this time noted. She continues to be quite lethargic and has diminished appetite. Appreciate palliative care involvement. We will try Lasix infusion today and see if there is any significant improvement overnight with further diuresis, otherwise may need to consider comfort care.  2/25:Patient was started on Lasix infusion yesterday with some minimal diuresis of slightly less than 1 L, but she seems more awake and interactive today. Will increase Lasix infusion rate and see how much she begins to diurese by tomorrow. Continue to follow labs. She will likely need close monitoring through the weekend to see whether or not she improves from the current treatment or if she will need hospice.  2/26:Patient appears to diurese well with output noted while on increased Lasix infusion. She has had quite a bit of p.o. intake and this will be controlled and her new heart healthy diet. Plan to continue close monitoring of volume status.  2/27: Patient continues to diurese quite well with over 3 L of net diuresis in the last 24 hours documented.  Her weight appears to have come down to 179 pounds versus 209 pounds on admission.  Renal function is stable.  Continue to supplement potassium with ongoing diuresis and monitor a.m.  labs.  2/28: Patient appears to be developing some contraction alkalosis and therefore, I will discontinue further Lasix drip.  She appears to have diuresed another 4 L and has stable and even improving renal function noted.  I would like for her oral intake to improve a little bit further at this point.  Continue to wean oxygen as tolerated.  Assessment & Plan:   Principal Problem:   Acute hypoxemic respiratory failure (HCC) Active Problems:   Hypothyroidism   GERD (gastroesophageal reflux disease)   Hyperlipidemia   AF (atrial fibrillation) (HCC)   Vitamin D deficiency   Metabolic syndrome   Venous stasis of both lower extremities   Acute congestive heart failure (HCC)   Volume overload   Dysphagia   Achalasia   Palliative care by specialist   Goals of care, counseling/discussion   Bilateral pleural effusion   Acute on chronic respiratory failure (HCC)   Pericardial effusion   1. Heart failure, preserved EF-I believe she may be near her dry weight at 164 pounds today.  She continues to diurese fairly aggressively with 4 L overnight.  Renal function remains stable, but unfortunately she appears to be developing a contraction alkalosis for which I will discontinue drip for now.  Continue fluid restriction.  Wean oxygen as tolerated. 2. Hypernatremia -this is ongoing. Continue diuresis and monitor. 3. Acute hypoxic respiratory failure likely exacerbated bysignificant pulmonary edema.  Wean oxygen as tolerated. 4. Small bilateral pleural effusions. Discussed with radiology that thoracentesis would not be of benefit. Try aggressive diuresis to see if improvement can be obtained. 5. Permanent atrial fibrillation-rate is well controlled. Continue apixaban for full anticoagulation. 6. Thrombocytopenia-following  closely as patient is fully anticoagulated with apixaban. Has been stable around 80.Repeat CBC in a.m. 7. Urinary retention - we attempted I/O catheterization for 24 hours  andpatientcontinuedto have urinary retention, foley placed. Continue Foley catheter due to aggressive diuresis. 8. Large pericardial effusion - noted on echo, no tamponade physiology, spoke with cardiologist Dr. Bronson Ing, recommended repeating limited Echo on 05/23/19 to reassess and itwas repeated andappears to be stable and possibly chronic.  9. AMS - CT head without contrast with no acute findings.This appears to be improving with diuresis. We will continue to follow. 10. Achalasia - noted on esophagram, GI saw her and shewill need to be onfull liquid diet indefinitely or at least until she has closefollow up with [facility] in 1-2 weeks post discharge. See GI consult notes. 11. Stage 3b CKD-currently stable and improving. Try IV Lasix infusion to see if further diuresis may help her renal function further. 12. Essential hypertension-patient's blood pressures are stable and we continue to follow. 13. Hypothyroidism-on levothyroxine. 14. GERD-Protonix ordered for GI protection. 15. Chronic lymphedema-patient has had some improvement in edema with diuresis but unable to tolerate TED hoses for long periods of time.  16. Goals of care continue ongoing discussions with palliative medicine team as patient continues to decline and has poor prognosis low likelihood of meaningful recovery I have been discussing this with son over the weekend and updating him on this reality. I appreciate the assistance of the palliative medicine team. 17. DNR / DNI   DVT prophylaxis:Apixaban Code Status:DNR Family Communication:Discussed with son Elberta Fortis at bedsideon 2/24 Disposition Plan:Anticipate discharge to SNF in next 1-2 days once weaned off oxygen.  Will need palliative reevaluation in rehab setting.   Consultants:  Palliative care  Procedures:  See below  Antimicrobials:   None  Subjective: Patient seen and evaluated today with no new acute complaints or concerns. No  acute concerns or events noted overnight.  She appears to continue diuresing well and is more awake and alert.  Objective: Vitals:   05/30/19 2035 05/30/19 2156 05/31/19 0513 05/31/19 0742  BP:  (!) 116/59 135/67   Pulse: 65 69 60   Resp: 16 16 16    Temp:  97.7 F (36.5 C) 98.7 F (37.1 C)   TempSrc:      SpO2:  98% 99%   Weight:   74.4 kg 78.7 kg  Height:        Intake/Output Summary (Last 24 hours) at 05/31/2019 0918 Last data filed at 05/31/2019 0741 Gross per 24 hour  Intake 120 ml  Output 4850 ml  Net -4730 ml   Filed Weights   05/30/19 0500 05/31/19 0513 05/31/19 0742  Weight: 81.2 kg 74.4 kg 78.7 kg    Examination:  General exam: Appears calm and comfortable, hard of hearing Respiratory system: Clear to auscultation. Respiratory effort normal.  Currently on 2-3 L nasal cannula oxygen. Cardiovascular system: S1 & S2 heard, RRR. No JVD, murmurs, rubs, gallops or clicks. No pedal edema. Gastrointestinal system: Abdomen is nondistended, soft and nontender. No organomegaly or masses felt. Normal bowel sounds heard. Central nervous system: Alert and oriented. No focal neurological deficits. Extremities: Symmetric 5 x 5 power. Skin: No rashes, lesions or ulcers Psychiatry: Flat affect    Data Reviewed: I have personally reviewed following labs and imaging studies  CBC: Recent Labs  Lab 05/25/19 0500 05/29/19 0415  WBC 3.6* 4.3  HGB 11.5* 11.8*  HCT 39.7 40.6  MCV 107.9* 106.6*  PLT 79* 87*  Basic Metabolic Panel: Recent Labs  Lab 05/27/19 0430 05/28/19 0427 05/29/19 0415 05/30/19 0638 05/31/19 0616  NA 149* 150* 152* 152* 150*  K 4.8 4.9 4.2 3.5 3.5  CL 101 101 98 93* 88*  CO2 40* 40* 43* 48* >50*  GLUCOSE 123* 82 86 97 103*  BUN 57* 67* 64* 55* 47*  CREATININE 1.27* 1.34* 1.41* 1.23* 1.18*  CALCIUM 9.4 9.4 9.6 9.4 9.3  MG  --  2.6* 2.3 2.1 2.0   GFR: Estimated Creatinine Clearance: 35.4 mL/min (A) (by C-G formula based on SCr of 1.18 mg/dL  (H)). Liver Function Tests: No results for input(s): AST, ALT, ALKPHOS, BILITOT, PROT, ALBUMIN in the last 168 hours. No results for input(s): LIPASE, AMYLASE in the last 168 hours. No results for input(s): AMMONIA in the last 168 hours. Coagulation Profile: No results for input(s): INR, PROTIME in the last 168 hours. Cardiac Enzymes: No results for input(s): CKTOTAL, CKMB, CKMBINDEX, TROPONINI in the last 168 hours. BNP (last 3 results) No results for input(s): PROBNP in the last 8760 hours. HbA1C: No results for input(s): HGBA1C in the last 72 hours. CBG: No results for input(s): GLUCAP in the last 168 hours. Lipid Profile: No results for input(s): CHOL, HDL, LDLCALC, TRIG, CHOLHDL, LDLDIRECT in the last 72 hours. Thyroid Function Tests: No results for input(s): TSH, T4TOTAL, FREET4, T3FREE, THYROIDAB in the last 72 hours. Anemia Panel: No results for input(s): VITAMINB12, FOLATE, FERRITIN, TIBC, IRON, RETICCTPCT in the last 72 hours. Sepsis Labs: No results for input(s): PROCALCITON, LATICACIDVEN in the last 168 hours.  No results found for this or any previous visit (from the past 240 hour(s)).       Radiology Studies: No results found.      Scheduled Meds: . apixaban  5 mg Oral BID  . calcium-vitamin D  1 tablet Oral Q breakfast  . Chlorhexidine Gluconate Cloth  6 each Topical Daily  . cholecalciferol  2,000 Units Oral Daily  . feeding supplement (ENSURE ENLIVE)  237 mL Oral BID BM  . feeding supplement (PRO-STAT SUGAR FREE 64)  30 mL Oral BID  . ipratropium-albuterol  3 mL Nebulization TID  . iron polysaccharides  150 mg Oral Daily  . levothyroxine  137 mcg Oral Q0600  . multivitamin with minerals  1 tablet Oral Daily  . pantoprazole  40 mg Oral Daily  . potassium chloride  40 mEq Oral Daily  . sodium chloride flush  3 mL Intravenous Q12H   Continuous Infusions: . sodium chloride       LOS: 12 days    Time spent: 30 minutes    Rosaura Bolon Hoover Brunette,  DO [facility] Pager [phone]  If 7PM-7AM, please contact night-coverage [email or web address] Password [facility] 05/31/2019, 9:18 AM

## 2019-05-31 NOTE — Progress Notes (Signed)
Critical Lab- CO2 > 50. Dr. Clelia Croft notified. Maurilio Lovely RN

## 2019-06-01 LAB — BASIC METABOLIC PANEL
Anion gap: 11 (ref 5–15)
BUN: 49 mg/dL — ABNORMAL HIGH (ref 8–23)
CO2: 48 mmol/L — ABNORMAL HIGH (ref 22–32)
Calcium: 9.4 mg/dL (ref 8.9–10.3)
Chloride: 88 mmol/L — ABNORMAL LOW (ref 98–111)
Creatinine, Ser: 1.2 mg/dL — ABNORMAL HIGH (ref 0.44–1.00)
GFR calc Af Amer: 48 mL/min — ABNORMAL LOW (ref >60)
GFR calc non Af Amer: 41 mL/min — ABNORMAL LOW (ref >60)
Glucose, Bld: 102 mg/dL — ABNORMAL HIGH (ref 70–99)
Potassium: 3.8 mmol/L (ref 3.5–5.1)
Sodium: 147 mmol/L — ABNORMAL HIGH (ref 135–145)

## 2019-06-01 LAB — MAGNESIUM: Magnesium: 2.1 mg/dL (ref 1.7–2.4)

## 2019-06-01 MED ORDER — PRO-STAT SUGAR FREE PO LIQD: 30.0000 mL | Freq: Two times a day (BID) | ORAL | 0 refills | Status: AC

## 2019-06-01 MED ORDER — ENSURE ENLIVE PO LIQD: 237.0000 mL | Freq: Two times a day (BID) | ORAL | 12 refills | Status: AC

## 2019-06-01 NOTE — Care Management Important Message (Signed)
Important Message  Patient Details  Name: [name] MRN: [id number] Date of Birth: February 20, 1933   Medicare Important Message Given:  Yes     Corey Harold 06/01/2019, 11:02 AM

## 2019-06-01 NOTE — Discharge Summary (Signed)
Physician Discharge Summary  [name] TMB:[id number] DOB: [birth year] DOA: 05/19/2019  PCP: Raliegh Ip, DO  Admit date: 05/19/2019  Discharge date: 06/01/2019  Admitted From:Home  Disposition:  SNF  Recommendations for Outpatient Follow-up:  1. Follow up with PCP in 1-2 weeks 2. Continue on home Lasix 40 mg daily as prior with some potassium supplementation and monitor weights daily.  Current weight on day of discharge is 164 pounds.  Total diuresis of over 14 L of fluid noted throughout this admission. 3. Please consider palliative follow-up in the rehab setting if patient fails to improve with rehabilitation.  Home Health: None  Equipment/Devices: None  Discharge Condition: Stable  CODE STATUS: DNR  Diet recommendation: Heart Healthy with 2 L fluid restriction  Brief/Interim Summary: 86 year old female with A. fib on apixaban, GERD, hypertension, hypothyroidism, chronic lymphedema and chronic anemia, stage IIIb CKD presented with increasing lower extremity edema and shortness of breath with exertion progressive over the past 4 to 6 weeks. Patient also recalled a 10 pound weight gain. Patient was sent from PCP office with concerns for new findings of CHF.  2/24:Patient noted to have worsening pulmonary edema on chest x-ray discussed with radiology. No significant pleural effusions were need for thoracentesis at this time noted. She continues to be quite lethargic and has diminished appetite. Appreciate palliative care involvement. We will try Lasix infusion today and see if there is any significant improvement overnight with further diuresis, otherwise may need to consider comfort care.  2/25:Patient was started on Lasix infusion yesterday with some minimal diuresis of slightly less than 1 L, but she seems more awake and interactive today. Will increase Lasix infusion rate and see how much she begins to diurese by tomorrow. Continue to follow labs. She will  likely need close monitoring through the weekend to see whether or not she improves from the current treatment or if she will need hospice.  2/26:Patient appears to diurese well with output noted while on increased Lasix infusion. She has had quite a bit of p.o. intake and this will be controlled and her new heart healthy diet. Plan to continue close monitoring of volume status.  2/27: Patient continues to diurese quite well with over 3 L of net diuresis in the last 24 hours documented. Her weight appears to have come down to 179 pounds versus 209 pounds on admission. Renal function is stable. Continue to supplement potassium with ongoing diuresis and monitor a.m. labs.  2/28: Patient appears to be developing some contraction alkalosis and therefore, I will discontinue further Lasix drip.  She appears to have diuresed another 4 L and has stable and even improving renal function noted.  I would like for her oral intake to improve a little bit further at this point.  Continue to wean oxygen as tolerated.  3/1: Patient appears to have diuresed to maximal tolerance and has been optimized during this inpatient stay.  She should continue her home Lasix 40 mg daily regimen with some potassium supplementation and monitor her daily weights and rehab.  Consider palliative care reevaluation at that time and possible hospice if oral intake is poor as well as progression of strength and other PT related parameters.  She appears stable for discharge today with no other acute events noted throughout the course of this admission.  Discharge Diagnoses:  Principal Problem:   Acute hypoxemic respiratory failure (HCC) Active Problems:   Hypothyroidism   GERD (gastroesophageal reflux disease)   Hyperlipidemia   AF (atrial fibrillation) (  HCC)   Vitamin D deficiency   Metabolic syndrome   Venous stasis of both lower extremities   Acute congestive heart failure (HCC)   Volume overload    Dysphagia   Achalasia   Palliative care by specialist   Goals of care, counseling/discussion   Bilateral pleural effusion   Acute on chronic respiratory failure (HCC)   Pericardial effusion  Principal discharge diagnosis: Acute hypoxemic respiratory failure secondary to heart failure in the setting of preserved ejection fraction with associated small bilateral pleural effusions.  Discharge Instructions  Discharge Instructions    Diet - low sodium heart healthy   Complete by: As directed    Increase activity slowly   Complete by: As directed      Allergies as of 06/01/2019      Reactions   Aspirin Nausea Only   Codeine Nausea Only   Sulfa Antibiotics    Vicodin [hydrocodone-acetaminophen] Nausea Only   Warfarin Sodium Other (See Comments)   Tingling all over   Cephalexin Rash   Red, rash covering lower limbs.   Doxycycline Rash      Medication List    STOP taking these medications   LORazepam 0.5 MG tablet Commonly known as: ATIVAN     TAKE these medications   albuterol 108 (90 Base) MCG/ACT inhaler Commonly known as: VENTOLIN HFA Inhale 2 puffs into the lungs every 6 (six) hours as needed for wheezing or shortness of breath.   calcium-vitamin D 500-200 MG-UNIT tablet Commonly known as: OSCAL WITH D Take 1 tablet by mouth daily.   Centrum Silver tablet Take 1 tablet by mouth daily.   Eliquis 5 MG Tabs tablet Generic drug: apixaban TAKE  (1)  TABLET TWICE A DAY. What changed: See the new instructions.   feeding supplement (ENSURE ENLIVE) Liqd Take 237 mLs by mouth 2 (two) times daily between meals.   feeding supplement (PRO-STAT SUGAR FREE 64) Liqd Take 30 mLs by mouth 2 (two) times daily.   furosemide 40 MG tablet Commonly known as: LASIX TAKE 1 TABLET DAILY.   gabapentin 100 MG capsule Commonly known as: NEURONTIN Needs to be seen for further refills. One by mouth twice daily. What changed:   how much to take  how to take this  when to take  this  additional instructions   iron polysaccharides 150 MG capsule Commonly known as: Ferrex 150 Take 1 capsule (150 mg total) by mouth daily.   Klor-Con 10 10 MEQ tablet Generic drug: potassium chloride TAKE 1 TABLET DAILY What changed:   how much to take  when to take this   levothyroxine 137 MCG tablet Commonly known as: SYNTHROID Take 1 tablet (137 mcg total) by mouth daily. as directed   lisinopril 40 MG tablet Commonly known as: ZESTRIL TAKE 1 TABLET DAILY   mupirocin ointment 2 % Commonly known as: BACTROBAN Place 1 application into the nose 2 (two) times daily.   omeprazole 20 MG capsule Commonly known as: PRILOSEC TAKE 1 CAPSULE BY MOUTH TWICE DAILY BEFORE A MEAL (Needs to be seen before next refill) What changed: See the new instructions.   polyethylene glycol powder 17 GM/SCOOP powder Commonly known as: GLYCOLAX/MIRALAX DISSOLVE 17 GRAMS (ONE CAPFUL) OF POWDER IN WATER & DRINK ONCE DAILY AS NEEDED What changed: See the new instructions.   Vitamin D3 50 MCG (2000 UT) Tabs Take 1 tablet by mouth daily.       Contact information for follow-up providers    Raliegh Ip, DO Follow  up in 1 week(s).   Specialty: Family Medicine Contact information: [address] [place] [address] [phone]            Contact information for after-discharge care    Destination    [facility] .   Service: Skilled Nursing Contact information: [address] [place] [address] [phone]                 Allergies  Allergen Reactions  . Aspirin Nausea Only  . Codeine Nausea Only  . Sulfa Antibiotics   . Vicodin [Hydrocodone-Acetaminophen] Nausea Only  . Warfarin Sodium Other (See Comments)    Tingling all over  . Cephalexin Rash    Red, rash covering lower limbs.  . Doxycycline Rash    Consultations:  Palliative care   Procedures/Studies: CT HEAD WO CONTRAST  Result Date: 05/24/2019 CLINICAL DATA:   86 year old female with altered mental status. Admitted with respiratory failure. EXAM: CT HEAD WITHOUT CONTRAST TECHNIQUE: Contiguous axial images were obtained from the base of the skull through the vertex without intravenous contrast. COMPARISON:  Brain MRI 02/25/2018. FINDINGS: Brain: Cerebral volume is stable and within normal limits for age. There is a chronic perivascular space at the inferior left lentiform (normal variant). No midline shift, ventriculomegaly, mass effect, evidence of mass lesion, intracranial hemorrhage or evidence of cortically based acute infarction. Normal for age gray-white matter differentiation. Vascular: Calcified atherosclerosis at the skull base. Skull: No acute osseous abnormality identified. Sinuses/Orbits: Visualized paranasal sinuses and mastoids are stable and well pneumatized. Other: No acute orbit or scalp soft tissue finding. Scalp vessels are prominent diffusely, although unchanged from the prior MRI. IMPRESSION: Normal for age non contrast CT appearance of the brain. Electronically Signed   By: Odessa Fleming M.D.   On: 05/24/2019 18:19   DG CHEST PORT 1 VIEW  Result Date: 05/27/2019 CLINICAL DATA:  Bilateral pleural effusion and pericardial effusion, negative COVID-19 testing 8 days prior EXAM: PORTABLE CHEST 1 VIEW COMPARISON:  Radiograph 05/24/2019 FINDINGS: Diminishing lung volumes with persistent bilateral interstitial and airspace opacities, slightly increasing in the right upper and lower lung though this may be attributable to the lower volumes. Stable cardiomegaly. Bilateral effusions. No pneumothorax. No acute osseous or soft tissue abnormality. Telemetry leads overlie the chest. IMPRESSION: Diminishing lung volumes with persistent bilateral interstitial and airspace opacities, slightly increasing in the right upper and lower lung though this may be attributable to lower volumes. Bilateral effusions. Electronically Signed   By: Kreg Shropshire M.D.   On: 05/27/2019  05:58   DG CHEST PORT 1 VIEW  Result Date: 05/24/2019 CLINICAL DATA:  Acute on chronic respiratory failure. EXAM: PORTABLE CHEST 1 VIEW COMPARISON:  05/19/2019 FINDINGS: Patient is rotated to the right. There is mild motion artifact. Lungs are adequately inflated with interval worsening bibasilar opacification likely moderate bilateral effusions with associated bibasilar atelectasis. Infection in the mid to lower lungs is possible. Stable cardiomegaly with mild prominence of the perihilar markings suggesting mild vascular congestion. Remainder the exam is unchanged. IMPRESSION: Mild cardiomegaly with mild vascular congestion. Worsening bilateral moderate pleural effusions likely with associated bibasilar atelectasis. Infection in the lung bases is possible. Electronically Signed   By: Elberta Fortis M.D.   On: 05/24/2019 10:40   DG Chest Port 1 View  Result Date: 05/19/2019 CLINICAL DATA:  Decreased oxygen saturation today. Lower extremity swelling. EXAM: PORTABLE CHEST 1 VIEW COMPARISON:  PA and lateral chest 05/28/2017. FINDINGS: There is massive cardiomegaly. Small to moderate  bilateral pleural effusions are seen, larger on the right. Pulmonary edema and basilar atelectasis are seen. No pneumothorax. No acute bony abnormality. Avascular necrosis of the humeral heads is noted. IMPRESSION: Cardiomegaly with interstitial pulmonary edema and small to moderate pleural effusions, larger on the right. Electronically Signed   By: Drusilla Kannerhomas  Dalessio M.D.   On: 05/19/2019 12:25   ECHOCARDIOGRAM COMPLETE  Result Date: 05/20/2019    ECHOCARDIOGRAM REPORT   Patient Name:   Verna CzechBESSIE F Knudtson Date of Exam: 05/20/2019 Medical Rec #:  [id number]      Height:       64.0 in Accession #:    [id number]     Weight:       208.8 lb Date of Birth:  07-22-1932      BSA:          1.99 m Patient Age:    86 years       BP:           100/52 mmHg Patient Gender: F              HR:           73 bpm. [facility] Procedure: 2D  Echo Indications:    CHF-Acute Diastolic 428.31 / I50.31  History:        Patient has prior history of Echocardiogram examinations, most                 recent 03/16/2015. Arrythmias:Atrial Fibrillation; Risk                 Factors:Dyslipidemia, Hypertension and Non-Smoker. Acute                 hypoxemic respiratory failure , First degree atrioventricular                 block, GERD, Non-rheumatic mitral regurgitation.  Sonographer:    Jeryl ColumbiaJohanna Elliott RDCS (AE) Referring Phys: [phone] Lamont DowdyRATIK D [facility] IMPRESSIONS  1. Left ventricular ejection fraction, by estimation, is 60 to 65%. The left ventricle has normal function. The left ventricle has no regional wall motion abnormalities. There is mild concentric left ventricular hypertrophy. Left ventricular diastolic parameters are indeterminate. Elevated left ventricular end-diastolic pressure.  2. Right ventricular systolic function is normal. The right ventricular size is mildly enlarged. There is severely elevated pulmonary artery systolic pressure.  3. Left atrial size was severely dilated.  4. Right atrial size was severely dilated.  5. IVC collapses. No RA or RV collapse. No signs of tamponade. Large pericardial effusion. The pericardial effusion is circumferential.  6. The mitral valve is grossly normal. Mild mitral valve regurgitation.  7. Tricuspid valve regurgitation is moderate.  8. The aortic valve is tricuspid. Aortic valve regurgitation is mild. Mild aortic valve stenosis. Aortic valve area, by VTI measures 1.57 cm. Aortic valve mean gradient measures 17.0 mmHg. Aortic valve Vmax measures 2.79 m/s.  9. The inferior vena cava is dilated in size with >50% respiratory variability, suggesting right atrial pressure of 8 mmHg. FINDINGS  Left Ventricle: Left ventricular ejection fraction, by estimation, is 60 to 65%. The left ventricle has normal function. The left ventricle has no regional wall motion abnormalities. The left ventricular internal cavity size was  normal in size. There is  mild concentric left ventricular hypertrophy. Left ventricular diastolic parameters are indeterminate. Elevated left ventricular end-diastolic pressure. Right Ventricle: The right ventricular size is mildly enlarged. No increase in right ventricular wall thickness. Right ventricular systolic function is  normal. There is severely elevated pulmonary artery systolic pressure. The tricuspid regurgitant velocity is 4.02 m/s, and with an assumed right atrial pressure of 10 mmHg, the estimated right ventricular systolic pressure is 74.6 mmHg. Left Atrium: Left atrial size was severely dilated. Right Atrium: Right atrial size was severely dilated. Pericardium: IVC collapses. No RA or RV collapse. No signs of tamponade. A large pericardial effusion is present. The pericardial effusion is circumferential. Mitral Valve: The mitral valve is grossly normal. Mild mitral valve regurgitation. Tricuspid Valve: The tricuspid valve is grossly normal. Tricuspid valve regurgitation is moderate. Aortic Valve: The aortic valve is tricuspid. Aortic valve regurgitation is mild. Aortic regurgitation PHT measures 540 msec. Mild aortic stenosis is present. Moderate aortic valve annular calcification. There is mild calcification of the aortic valve. Aortic valve mean gradient measures 17.0 mmHg. Aortic valve peak gradient measures 31.1 mmHg. Aortic valve area, by VTI measures 1.57 cm. Pulmonic Valve: The pulmonic valve was grossly normal. Pulmonic valve regurgitation is not visualized. Aorta: The aortic root is normal in size and structure. Venous: The inferior vena cava is dilated in size with greater than 50% respiratory variability, suggesting right atrial pressure of 8 mmHg. IAS/Shunts: No atrial level shunt detected by color flow Doppler.  LEFT VENTRICLE PLAX 2D LVIDd:         5.12 cm  Diastology LVIDs:         3.07 cm  LV e' lateral:   8.81 cm/s LV PW:         1.34 cm  LV E/e' lateral: 15.3 LV IVS:        1.15  cm  LV e' medial:    6.53 cm/s LVOT diam:     1.80 cm  LV E/e' medial:  20.7 LV SV:         91.86 ml LV SV Index:   41.66 LVOT Area:     2.54 cm  LEFT ATRIUM              Index       RIGHT ATRIUM           Index LA diam:        4.90 cm  2.46 cm/m  RA Area:     38.50 cm LA Vol (A2C):   186.0 ml 93.35 ml/m RA Volume:   166.00 ml 83.32 ml/m LA Vol (A4C):   160.0 ml 80.30 ml/m LA Biplane Vol: 173.0 ml 86.83 ml/m  AORTIC VALVE AV Area (Vmax):    1.62 cm AV Area (Vmean):   1.49 cm AV Area (VTI):     1.57 cm AV Vmax:           278.67 cm/s AV Vmean:          192.333 cm/s AV VTI:            0.584 m AV Peak Grad:      31.1 mmHg AV Mean Grad:      17.0 mmHg LVOT Vmax:         177.33 cm/s LVOT Vmean:        112.667 cm/s LVOT VTI:          0.361 m LVOT/AV VTI ratio: 0.62 AI PHT:            540 msec  AORTA Ao Root diam: 2.80 cm MITRAL VALVE                TRICUSPID VALVE MV Area (PHT): 2.42 cm     TR  Peak grad:   64.6 mmHg MV Decel Time: 313 msec     TR Vmax:        402.00 cm/s MR Peak grad: 63.0 mmHg MR Mean grad: 45.0 mmHg     SHUNTS MR Vmax:      397.00 cm/s   Systemic VTI:  0.36 m MR Vmean:     320.0 cm/s    Systemic Diam: 1.80 cm MV E velocity: 135.00 cm/s MV A velocity: 39.40 cm/s MV E/A ratio:  3.43 Kate Sable MD Electronically signed by Kate Sable MD Signature Date/Time: 05/20/2019/2:55:41 PM    Final    ECHOCARDIOGRAM LIMITED  Result Date: 05/23/2019    ECHOCARDIOGRAM LIMITED REPORT   Patient Name:   KYLINA VULTAGGIO Date of Exam: 05/23/2019 Medical Rec #:  [id number]      Height:       64.0 in Accession #:    [id number]     Weight:       195.1 lb Date of Birth:  1932/09/05      BSA:          1.936 m Patient Age:    40 years       BP:           129/70 mmHg Patient Gender: F              HR:           78 bpm. [facility] Procedure: Limited Echo and Cardiac Doppler Indications:    Pericardial Effusion 423.9 / l31.3  History:        Patient has prior history of Echocardiogram  examinations, most                 recent 05/20/2019. CHF, Arrythmias:Atrial Fibrillation; Risk                 Factors:Hypertension and Dyslipidemia. Non-Smoker. Acute                 hypoxemic respiratory failure , First degree atrioventricular                 block, GERD, Non-rheumatic mitral regurgitation.  Sonographer:    Alvino Chapel RCS Referring Phys: [place]  1. Limited study.  2. Large pericardial effusion, circumferential but to greater degree posterior and lateral. Dimensions are similar to previous study on February 17. No obvious significant respiratory variation in mitral outflow to suggest tamponade physiology. There is  no compression of RA or RV either, but evidence of significant pulmonary hypertension (last study RVSP was approximately 75 mmHg) and this could be inhibiting compression of RV chamber. Organizing material adherent to pericardium suggests possible chronicity, particularly given effusion size.  3. Left ventricular ejection fraction, by estimation, is 60 to 65%. The left ventricle has normal function. The left ventricle has no regional wall motion abnormalities. There is the interventricular septum is flattened in systole and diastole, consistent with right ventricular pressure and volume overload.  4. The right ventricular size is moderately enlarged.  5. Left atrial size was severely dilated.  6. Right atrial size was severely dilated.  7. The mitral valve is grossly normal. FINDINGS  Left Ventricle: Left ventricular ejection fraction, by estimation, is 60 to 65%. The left ventricle has normal function. The left ventricle has no regional wall motion abnormalities. The interventricular septum is flattened in systole and diastole, consistent with right ventricular pressure and volume overload. Right Ventricle: The right ventricular size  is moderately enlarged. Left Atrium: Left atrial size was severely dilated. Right Atrium: Right atrial size was severely  dilated. Pericardium: Dimensions are similar to previous study on February 17. No obvious significant respiratory variation in mitral outflow to suggest tamponade physiology. There is no compression of RA or RV either, but evidence of significant pulmonary hypertension (last study RVSP was approximately 75 mmHg) and this could be inhibiting compression of RV chamber. Organizing material adherent to pericardium suggests possible chronicity, particularly given effusion size. A large pericardial effusion is present. The pericardial effusion is circumferential. Mitral Valve: The mitral valve is grossly normal. Mild to moderate mitral annular calcification. Additional Comments: There is pleural effusion in the left lateral region. Nona DellSamuel Mcdowell MD Electronically signed by Nona DellSamuel Mcdowell MD Signature Date/Time: 05/23/2019/2:09:38 PM    Final    DG ESOPHAGUS W SINGLE CM (SOL OR THIN BA)  Result Date: 05/22/2019 CLINICAL DATA:  DYSPHAGIA. EXAM: ESOPHOGRAM/BARIUM SWALLOW TECHNIQUE: Single contrast examination was performed using thin barium or water soluble. FLUOROSCOPY TIME:  Fluoroscopy Time: 2 minutes 18 seconds Radiation Exposure Index (if provided by the fluoroscopic device): 38.6 mGy COMPARISON:  None. FINDINGS: The patient ingested thin barium in a semi upright position. The patient has poor esophageal peristalsis with persistent spasm in the distal esophagus consistent with achalasia. I placed the patient in an almost upright position and only a small amount of barium trickled into the nondistended stomach. IMPRESSION: Achalasia. Minimal emptying of the dilated esophagus. Minimal peristalsis in the esophagus. The patient tolerated the procedure well. No immediate complications. Electronically Signed   By: Francene BoyersJames  Maxwell M.D.   On: 05/22/2019 11:18     Discharge Exam: Vitals:   06/01/19 0518 06/01/19 0810  BP: 130/62   Pulse: 61   Resp: 18   Temp: 98.5 F (36.9 C)   SpO2: 97% 96%   Vitals:    05/31/19 1942 05/31/19 2117 06/01/19 0518 06/01/19 0810  BP:  109/66 130/62   Pulse: 65 66 61   Resp: 18 20 18    Temp:  98.1 F (36.7 C) 98.5 F (36.9 C)   TempSrc:  Oral Oral   SpO2: 93% 100% 97% 96%  Weight:   74.7 kg   Height:        General: Pt is alert, awake, not in acute distress Cardiovascular: RRR, S1/S2 +, no rubs, no gallops Respiratory: CTA bilaterally, no wheezing, no rhonchi, currently on 2 L nasal cannula oxygen which is being weaned Abdominal: Soft, NT, ND, bowel sounds + Extremities: no edema, no cyanosis    The results of significant diagnostics from this hospitalization (including imaging, microbiology, ancillary and laboratory) are listed below for reference.     Microbiology: No results found for this or any previous visit (from the past 240 hour(s)).   Labs: BNP (last 3 results) Recent Labs    05/19/19 1200  BNP 359.0*   Basic Metabolic Panel: Recent Labs  Lab 05/28/19 0427 05/29/19 0415 05/30/19 0638 05/31/19 0616 06/01/19 0439  NA 150* 152* 152* 150* 147*  K 4.9 4.2 3.5 3.5 3.8  CL 101 98 93* 88* 88*  CO2 40* 43* 48* >50* 48*  GLUCOSE 82 86 97 103* 102*  BUN 67* 64* 55* 47* 49*  CREATININE 1.34* 1.41* 1.23* 1.18* 1.20*  CALCIUM 9.4 9.6 9.4 9.3 9.4  MG 2.6* 2.3 2.1 2.0 2.1   Liver Function Tests: No results for input(s): AST, ALT, ALKPHOS, BILITOT, PROT, ALBUMIN in the last 168 hours. No results for input(s): LIPASE,  AMYLASE in the last 168 hours. No results for input(s): AMMONIA in the last 168 hours. CBC: Recent Labs  Lab 05/29/19 0415  WBC 4.3  HGB 11.8*  HCT 40.6  MCV 106.6*  PLT 87*   Cardiac Enzymes: No results for input(s): CKTOTAL, CKMB, CKMBINDEX, TROPONINI in the last 168 hours. BNP: Invalid input(s): POCBNP CBG: No results for input(s): GLUCAP in the last 168 hours. D-Dimer No results for input(s): DDIMER in the last 72 hours. Hgb A1c No results for input(s): HGBA1C in the last 72 hours. Lipid Profile No  results for input(s): CHOL, HDL, LDLCALC, TRIG, CHOLHDL, LDLDIRECT in the last 72 hours. Thyroid function studies No results for input(s): TSH, T4TOTAL, T3FREE, THYROIDAB in the last 72 hours.  Invalid input(s): FREET3 Anemia work up No results for input(s): VITAMINB12, FOLATE, FERRITIN, TIBC, IRON, RETICCTPCT in the last 72 hours. Urinalysis    Component Value Date/Time   APPEARANCEUR Clear 06/14/2015 0842   GLUCOSEU Negative 06/14/2015 0842   BILIRUBINUR Negative 06/14/2015 0842   PROTEINUR Negative 06/14/2015 0842   UROBILINOGEN negative 04/16/2015 0844   NITRITE Negative 06/14/2015 0842   LEUKOCYTESUR Negative 06/14/2015 0842   Sepsis Labs Invalid input(s): PROCALCITONIN,  WBC,  LACTICIDVEN Microbiology No results found for this or any previous visit (from the past 240 hour(s)).   Time coordinating discharge: 35 minutes  SIGNED:   Erick Blinks, DO [facility] 06/01/2019, 9:13 AM  If 7PM-7AM, please contact night-coverage [email or web address]

## 2019-06-01 NOTE — TOC Progression Note (Signed)
Transition of Care [facility]) - Progression Note    Patient Details  Name: [name] MRN: [id number] Date of Birth: [birth year]  Transition of Care [facility]) CM/SW Contact  Sheryl Towell, Chrystine Oiler, RN Phone Number: 06/01/2019, 11:01 AM  Clinical Narrative:   Patient medically ready for discharge. Discussed bed offers with patient, she defers to son. Call to son, he elects [facility]. [facility] can accept patient tomorrow and will start insurance authorization today.     Expected Discharge Plan: Skilled Nursing Facility Barriers to Discharge: Continued Medical Work up  Expected Discharge Plan and Services Expected Discharge Plan: Skilled Nursing Facility       Living arrangements for the past 2 months: Single Family Home Expected Discharge Date: 06/01/19                                     Social Determinants of Health (SDOH) Interventions    Readmission Risk Interventions No flowsheet data found.

## 2019-06-01 NOTE — Plan of Care (Signed)

## 2019-06-02 DIAGNOSIS — E875 Hyperkalemia: Secondary | ICD-10-CM | POA: Diagnosis not present

## 2019-06-02 DIAGNOSIS — K219 Gastro-esophageal reflux disease without esophagitis: Secondary | ICD-10-CM | POA: Diagnosis not present

## 2019-06-02 DIAGNOSIS — K220 Achalasia of cardia: Secondary | ICD-10-CM | POA: Diagnosis not present

## 2019-06-02 DIAGNOSIS — R2689 Other abnormalities of gait and mobility: Secondary | ICD-10-CM | POA: Diagnosis not present

## 2019-06-02 DIAGNOSIS — R1310 Dysphagia, unspecified: Secondary | ICD-10-CM | POA: Diagnosis not present

## 2019-06-02 DIAGNOSIS — N1832 Chronic kidney disease, stage 3b: Secondary | ICD-10-CM | POA: Diagnosis not present

## 2019-06-02 DIAGNOSIS — I5033 Acute on chronic diastolic (congestive) heart failure: Secondary | ICD-10-CM | POA: Diagnosis not present

## 2019-06-02 DIAGNOSIS — J9601 Acute respiratory failure with hypoxia: Secondary | ICD-10-CM | POA: Diagnosis not present

## 2019-06-02 DIAGNOSIS — B9620 Unspecified Escherichia coli [E. coli] as the cause of diseases classified elsewhere: Secondary | ICD-10-CM | POA: Diagnosis not present

## 2019-06-02 DIAGNOSIS — Z7401 Bed confinement status: Secondary | ICD-10-CM | POA: Diagnosis not present

## 2019-06-02 DIAGNOSIS — I87303 Chronic venous hypertension (idiopathic) without complications of bilateral lower extremity: Secondary | ICD-10-CM | POA: Diagnosis not present

## 2019-06-02 DIAGNOSIS — I4821 Permanent atrial fibrillation: Secondary | ICD-10-CM | POA: Diagnosis not present

## 2019-06-02 DIAGNOSIS — R319 Hematuria, unspecified: Secondary | ICD-10-CM | POA: Diagnosis not present

## 2019-06-02 DIAGNOSIS — E039 Hypothyroidism, unspecified: Secondary | ICD-10-CM | POA: Diagnosis not present

## 2019-06-02 DIAGNOSIS — H9190 Unspecified hearing loss, unspecified ear: Secondary | ICD-10-CM | POA: Diagnosis not present

## 2019-06-02 DIAGNOSIS — R69 Illness, unspecified: Secondary | ICD-10-CM | POA: Diagnosis not present

## 2019-06-02 DIAGNOSIS — R001 Bradycardia, unspecified: Secondary | ICD-10-CM | POA: Diagnosis not present

## 2019-06-02 DIAGNOSIS — E860 Dehydration: Secondary | ICD-10-CM | POA: Diagnosis not present

## 2019-06-02 DIAGNOSIS — I959 Hypotension, unspecified: Secondary | ICD-10-CM | POA: Diagnosis not present

## 2019-06-02 DIAGNOSIS — I4891 Unspecified atrial fibrillation: Secondary | ICD-10-CM | POA: Diagnosis not present

## 2019-06-02 DIAGNOSIS — I5032 Chronic diastolic (congestive) heart failure: Secondary | ICD-10-CM | POA: Diagnosis not present

## 2019-06-02 DIAGNOSIS — Z7189 Other specified counseling: Secondary | ICD-10-CM | POA: Diagnosis not present

## 2019-06-02 DIAGNOSIS — N189 Chronic kidney disease, unspecified: Secondary | ICD-10-CM | POA: Diagnosis not present

## 2019-06-02 DIAGNOSIS — E559 Vitamin D deficiency, unspecified: Secondary | ICD-10-CM | POA: Diagnosis not present

## 2019-06-02 DIAGNOSIS — I509 Heart failure, unspecified: Secondary | ICD-10-CM | POA: Diagnosis not present

## 2019-06-02 DIAGNOSIS — Z20822 Contact with and (suspected) exposure to covid-19: Secondary | ICD-10-CM | POA: Diagnosis not present

## 2019-06-02 DIAGNOSIS — Z66 Do not resuscitate: Secondary | ICD-10-CM | POA: Diagnosis not present

## 2019-06-02 DIAGNOSIS — Z515 Encounter for palliative care: Secondary | ICD-10-CM | POA: Diagnosis not present

## 2019-06-02 DIAGNOSIS — M6281 Muscle weakness (generalized): Secondary | ICD-10-CM | POA: Diagnosis not present

## 2019-06-02 DIAGNOSIS — B961 Klebsiella pneumoniae [K. pneumoniae] as the cause of diseases classified elsewhere: Secondary | ICD-10-CM | POA: Diagnosis not present

## 2019-06-02 DIAGNOSIS — D649 Anemia, unspecified: Secondary | ICD-10-CM | POA: Diagnosis not present

## 2019-06-02 DIAGNOSIS — Z79899 Other long term (current) drug therapy: Secondary | ICD-10-CM | POA: Diagnosis not present

## 2019-06-02 DIAGNOSIS — J449 Chronic obstructive pulmonary disease, unspecified: Secondary | ICD-10-CM | POA: Diagnosis not present

## 2019-06-02 DIAGNOSIS — N179 Acute kidney failure, unspecified: Secondary | ICD-10-CM | POA: Diagnosis not present

## 2019-06-02 DIAGNOSIS — I313 Pericardial effusion (noninflammatory): Secondary | ICD-10-CM | POA: Diagnosis not present

## 2019-06-02 DIAGNOSIS — Z743 Need for continuous supervision: Secondary | ICD-10-CM | POA: Diagnosis not present

## 2019-06-02 DIAGNOSIS — N390 Urinary tract infection, site not specified: Secondary | ICD-10-CM | POA: Diagnosis not present

## 2019-06-02 DIAGNOSIS — Z03818 Encounter for observation for suspected exposure to other biological agents ruled out: Secondary | ICD-10-CM | POA: Diagnosis not present

## 2019-06-02 DIAGNOSIS — E871 Hypo-osmolality and hyponatremia: Secondary | ICD-10-CM | POA: Diagnosis not present

## 2019-06-02 DIAGNOSIS — E785 Hyperlipidemia, unspecified: Secondary | ICD-10-CM | POA: Diagnosis not present

## 2019-06-02 DIAGNOSIS — R6889 Other general symptoms and signs: Secondary | ICD-10-CM | POA: Diagnosis not present

## 2019-06-02 DIAGNOSIS — G9341 Metabolic encephalopathy: Secondary | ICD-10-CM | POA: Diagnosis not present

## 2019-06-02 DIAGNOSIS — I130 Hypertensive heart and chronic kidney disease with heart failure and stage 1 through stage 4 chronic kidney disease, or unspecified chronic kidney disease: Secondary | ICD-10-CM | POA: Diagnosis not present

## 2019-06-02 DIAGNOSIS — Z7901 Long term (current) use of anticoagulants: Secondary | ICD-10-CM | POA: Diagnosis not present

## 2019-06-02 LAB — RESPIRATORY PANEL BY RT PCR (FLU A&B, COVID)
Influenza A by PCR: NEGATIVE
Influenza B by PCR: NEGATIVE
SARS Coronavirus 2 by RT PCR: NEGATIVE

## 2019-06-02 NOTE — TOC Transition Note (Signed)
Transition of Care [facility]) - CM/SW Discharge Note   Patient Details  Name: GHISLAINE HARCUM MRN: [id number] Date of Birth: 10/29/1932  Transition of Care [facility]) CM/SW Contact:  Elliot Gault, LCSW Phone Number: 06/02/2019, 3:07 PM   Clinical Narrative:     Pt stable for dc and is able to transfer to [facility] today per Eunice Blase at [facility]. DC clinical sent electronically. Updated pt's son who remains in agreement.   EMS form printed to the unit. Contacted EMS to come get pt.   Updated charge RN with phone number for report.  There are no other TOC needs for dc.  Final next level of care: Skilled Nursing Facility Barriers to Discharge: Barriers Resolved   Patient Goals and CMS Choice Patient states their goals for this hospitalization and ongoing recovery are:: Return home after SNF CMS Medicare.gov Compare Post Acute Care list provided to:: Patient Represenative (must comment)(Patient's son, Lynessa Almanzar, as pt. is hard of hearing) Choice offered to / list presented to : Adult Children(Spoke with son, Teneshia Hedeen. Requested [facility].)  Discharge Placement              Patient chooses bed at: Other - please specify in the comment section below:([facility]) Patient to be transferred to facility by: EMS Name of family member notified: Mishaal Lansdale  Son Patient and family notified of of transfer: 06/02/19  Discharge Plan and Services                                     Social Determinants of Health (SDOH) Interventions     Readmission Risk Interventions Readmission Risk Prevention Plan 06/02/2019  Transportation Screening Complete  Medication Review (RN CM) Complete  Some recent data might be hidden

## 2019-06-02 NOTE — Progress Notes (Signed)
Report called to [facility]. Spoke with [place], RN

## 2019-06-02 NOTE — Progress Notes (Signed)
Pt voided around 0830 this morning.

## 2019-06-02 NOTE — Progress Notes (Signed)
Patient for probable discharge today.  Removed foley at 0330   Patient has not voided yet.  Will inform day shift. Patient otherwise has been stable through night.

## 2019-06-02 NOTE — Progress Notes (Signed)
Patient seen and evaluated this morning.  She appears to be in good spirits and denies any concerns.  No acute overnight events noted.  Foley catheter was removed early this morning and she appears to have voided with no further concerns noted.  Awaiting SNF placement.  Please refer to discharge summary dictated 06/01/2019.  Total care time: 20 minutes.

## 2019-06-04 ENCOUNTER — Telehealth: Payer: Self-pay | Admitting: *Deleted

## 2019-06-04 DIAGNOSIS — I4891 Unspecified atrial fibrillation: Secondary | ICD-10-CM | POA: Diagnosis not present

## 2019-06-04 DIAGNOSIS — I130 Hypertensive heart and chronic kidney disease with heart failure and stage 1 through stage 4 chronic kidney disease, or unspecified chronic kidney disease: Secondary | ICD-10-CM | POA: Diagnosis not present

## 2019-06-04 DIAGNOSIS — N1832 Chronic kidney disease, stage 3b: Secondary | ICD-10-CM | POA: Diagnosis not present

## 2019-06-04 DIAGNOSIS — I5033 Acute on chronic diastolic (congestive) heart failure: Secondary | ICD-10-CM | POA: Diagnosis not present

## 2019-06-04 NOTE — Telephone Encounter (Signed)
   Transitional Care Note Hospital Discharge to Skilled Nursing Facility 06/04/2019 Reviewed By: Almira Coaster, LPN  [name] was admitted to [facility] on 05/19/19 with Acute hypoxemic respiratory failure (HCC) .  she was transferred from [facility] to [facility] in [place] on 06/02/19.  she will be followed by the facility's staff physicians while there and will need to follow-up with Raliegh Ip, DO (PCP) within two weeks of discharge from SNF for Transitional Care Management.   Plan . Clinical staff will monitor discharge reports and contact patient to initiate TCM within 2 business days of SNF discharge.  . After discharge CCM team will collaborate with PCP regarding potential CCM eligibility and the benefits CCM services provide the patient. The primary goal would be to increase patient self-management of chronic medical conditions resulting in improved patient health outcomes, decreased exacerbations of chronic medical conditions, and decreased ED visits/hospital admissions.

## 2019-06-09 ENCOUNTER — Other Ambulatory Visit: Payer: Self-pay

## 2019-06-09 ENCOUNTER — Inpatient Hospital Stay (HOSPITAL_COMMUNITY)
Admission: EM | Admit: 2019-06-09 | Discharge: 2019-06-16 | DRG: 682 | Disposition: A | Discharge status: Skilled Nursing Facility | Payer: Medicare Other | Source: Skilled Nursing Facility | Attending: Family Medicine | Admitting: Family Medicine

## 2019-06-09 ENCOUNTER — Encounter (HOSPITAL_COMMUNITY): Payer: Self-pay

## 2019-06-09 ENCOUNTER — Inpatient Hospital Stay (HOSPITAL_COMMUNITY): Payer: Medicare Other

## 2019-06-09 DIAGNOSIS — N1832 Chronic kidney disease, stage 3b: Secondary | ICD-10-CM | POA: Diagnosis present

## 2019-06-09 DIAGNOSIS — B961 Klebsiella pneumoniae [K. pneumoniae] as the cause of diseases classified elsewhere: Secondary | ICD-10-CM | POA: Diagnosis present

## 2019-06-09 DIAGNOSIS — Z7901 Long term (current) use of anticoagulants: Secondary | ICD-10-CM

## 2019-06-09 DIAGNOSIS — Z66 Do not resuscitate: Secondary | ICD-10-CM | POA: Diagnosis not present

## 2019-06-09 DIAGNOSIS — D649 Anemia, unspecified: Secondary | ICD-10-CM | POA: Diagnosis present

## 2019-06-09 DIAGNOSIS — Z515 Encounter for palliative care: Secondary | ICD-10-CM | POA: Diagnosis present

## 2019-06-09 DIAGNOSIS — I4821 Permanent atrial fibrillation: Secondary | ICD-10-CM | POA: Diagnosis not present

## 2019-06-09 DIAGNOSIS — Z87442 Personal history of urinary calculi: Secondary | ICD-10-CM

## 2019-06-09 DIAGNOSIS — Z7189 Other specified counseling: Secondary | ICD-10-CM

## 2019-06-09 DIAGNOSIS — H9190 Unspecified hearing loss, unspecified ear: Secondary | ICD-10-CM | POA: Diagnosis not present

## 2019-06-09 DIAGNOSIS — R404 Transient alteration of awareness: Secondary | ICD-10-CM | POA: Diagnosis not present

## 2019-06-09 DIAGNOSIS — E875 Hyperkalemia: Secondary | ICD-10-CM | POA: Diagnosis present

## 2019-06-09 DIAGNOSIS — Z20822 Contact with and (suspected) exposure to covid-19: Secondary | ICD-10-CM | POA: Diagnosis not present

## 2019-06-09 DIAGNOSIS — N390 Urinary tract infection, site not specified: Secondary | ICD-10-CM | POA: Diagnosis not present

## 2019-06-09 DIAGNOSIS — R001 Bradycardia, unspecified: Secondary | ICD-10-CM | POA: Diagnosis present

## 2019-06-09 DIAGNOSIS — E871 Hypo-osmolality and hyponatremia: Secondary | ICD-10-CM | POA: Diagnosis present

## 2019-06-09 DIAGNOSIS — E039 Hypothyroidism, unspecified: Secondary | ICD-10-CM | POA: Diagnosis not present

## 2019-06-09 DIAGNOSIS — N189 Chronic kidney disease, unspecified: Secondary | ICD-10-CM | POA: Diagnosis present

## 2019-06-09 DIAGNOSIS — E860 Dehydration: Secondary | ICD-10-CM | POA: Diagnosis not present

## 2019-06-09 DIAGNOSIS — B9620 Unspecified Escherichia coli [E. coli] as the cause of diseases classified elsewhere: Secondary | ICD-10-CM | POA: Diagnosis not present

## 2019-06-09 DIAGNOSIS — E785 Hyperlipidemia, unspecified: Secondary | ICD-10-CM | POA: Diagnosis not present

## 2019-06-09 DIAGNOSIS — I959 Hypotension, unspecified: Secondary | ICD-10-CM | POA: Diagnosis not present

## 2019-06-09 DIAGNOSIS — I5032 Chronic diastolic (congestive) heart failure: Secondary | ICD-10-CM | POA: Diagnosis present

## 2019-06-09 DIAGNOSIS — Z79899 Other long term (current) drug therapy: Secondary | ICD-10-CM

## 2019-06-09 DIAGNOSIS — I5033 Acute on chronic diastolic (congestive) heart failure: Secondary | ICD-10-CM | POA: Diagnosis not present

## 2019-06-09 DIAGNOSIS — I4891 Unspecified atrial fibrillation: Secondary | ICD-10-CM | POA: Diagnosis present

## 2019-06-09 DIAGNOSIS — N179 Acute kidney failure, unspecified: Secondary | ICD-10-CM | POA: Diagnosis not present

## 2019-06-09 DIAGNOSIS — Z7401 Bed confinement status: Secondary | ICD-10-CM | POA: Diagnosis not present

## 2019-06-09 DIAGNOSIS — K219 Gastro-esophageal reflux disease without esophagitis: Secondary | ICD-10-CM | POA: Diagnosis present

## 2019-06-09 DIAGNOSIS — G9341 Metabolic encephalopathy: Secondary | ICD-10-CM | POA: Diagnosis present

## 2019-06-09 DIAGNOSIS — R6889 Other general symptoms and signs: Secondary | ICD-10-CM | POA: Diagnosis not present

## 2019-06-09 DIAGNOSIS — I313 Pericardial effusion (noninflammatory): Secondary | ICD-10-CM | POA: Diagnosis not present

## 2019-06-09 DIAGNOSIS — Z8249 Family history of ischemic heart disease and other diseases of the circulatory system: Secondary | ICD-10-CM

## 2019-06-09 DIAGNOSIS — R319 Hematuria, unspecified: Secondary | ICD-10-CM | POA: Diagnosis not present

## 2019-06-09 DIAGNOSIS — I130 Hypertensive heart and chronic kidney disease with heart failure and stage 1 through stage 4 chronic kidney disease, or unspecified chronic kidney disease: Secondary | ICD-10-CM | POA: Diagnosis present

## 2019-06-09 DIAGNOSIS — Z03818 Encounter for observation for suspected exposure to other biological agents ruled out: Secondary | ICD-10-CM | POA: Diagnosis not present

## 2019-06-09 DIAGNOSIS — Z743 Need for continuous supervision: Secondary | ICD-10-CM | POA: Diagnosis not present

## 2019-06-09 DIAGNOSIS — Z841 Family history of disorders of kidney and ureter: Secondary | ICD-10-CM

## 2019-06-09 DIAGNOSIS — J9601 Acute respiratory failure with hypoxia: Secondary | ICD-10-CM | POA: Diagnosis not present

## 2019-06-09 DIAGNOSIS — Z79890 Hormone replacement therapy (postmenopausal): Secondary | ICD-10-CM

## 2019-06-09 DIAGNOSIS — N183 Chronic kidney disease, stage 3 unspecified: Secondary | ICD-10-CM | POA: Diagnosis not present

## 2019-06-09 HISTORY — DX: Chronic venous hypertension (idiopathic) without complications of unspecified lower extremity: I87.309

## 2019-06-09 HISTORY — DX: Vitamin D deficiency, unspecified: E55.9

## 2019-06-09 HISTORY — DX: Achalasia of cardia: K22.0

## 2019-06-09 HISTORY — DX: Other pericardial effusion (noninflammatory): I31.39

## 2019-06-09 HISTORY — DX: Pericardial effusion (noninflammatory): I31.3

## 2019-06-09 HISTORY — DX: Heart failure, unspecified: I50.9

## 2019-06-09 HISTORY — DX: Acute respiratory failure, unspecified whether with hypoxia or hypercapnia: J96.00

## 2019-06-09 LAB — CBC WITH DIFFERENTIAL/PLATELET
Abs Immature Granulocytes: 0.01 10*3/uL (ref 0.00–0.07)
Basophils Absolute: 0 10*3/uL (ref 0.0–0.1)
Basophils Relative: 0 %
Eosinophils Absolute: 0.1 10*3/uL (ref 0.0–0.5)
Eosinophils Relative: 1 %
HCT: 36.1 % (ref 36.0–46.0)
Hemoglobin: 11.5 g/dL — ABNORMAL LOW (ref 12.0–15.0)
Immature Granulocytes: 0 %
Lymphocytes Relative: 12 %
Lymphs Abs: 0.7 10*3/uL (ref 0.7–4.0)
MCH: 31.1 pg (ref 26.0–34.0)
MCHC: 31.9 g/dL (ref 30.0–36.0)
MCV: 97.6 fL (ref 80.0–100.0)
Monocytes Absolute: 0.7 10*3/uL (ref 0.1–1.0)
Monocytes Relative: 13 %
Neutro Abs: 3.9 10*3/uL (ref 1.7–7.7)
Neutrophils Relative %: 74 %
Platelets: 169 10*3/uL (ref 150–400)
RBC: 3.7 MIL/uL — ABNORMAL LOW (ref 3.87–5.11)
RDW: 15.6 % — ABNORMAL HIGH (ref 11.5–15.5)
WBC: 5.3 10*3/uL (ref 4.0–10.5)
nRBC: 0 % (ref 0.0–0.2)

## 2019-06-09 LAB — MAGNESIUM: Magnesium: 2.9 mg/dL — ABNORMAL HIGH (ref 1.7–2.4)

## 2019-06-09 LAB — COMPREHENSIVE METABOLIC PANEL
ALT: 21 U/L (ref 0–44)
AST: 32 U/L (ref 15–41)
Albumin: 3.3 g/dL — ABNORMAL LOW (ref 3.5–5.0)
Alkaline Phosphatase: 135 U/L — ABNORMAL HIGH (ref 38–126)
Anion gap: 10 (ref 5–15)
BUN: 112 mg/dL — ABNORMAL HIGH (ref 8–23)
CO2: 34 mmol/L — ABNORMAL HIGH (ref 22–32)
Calcium: 8.8 mg/dL — ABNORMAL LOW (ref 8.9–10.3)
Chloride: 84 mmol/L — ABNORMAL LOW (ref 98–111)
Creatinine, Ser: 6.12 mg/dL — ABNORMAL HIGH (ref 0.44–1.00)
GFR calc Af Amer: 7 mL/min — ABNORMAL LOW (ref >60)
GFR calc non Af Amer: 6 mL/min — ABNORMAL LOW (ref >60)
Glucose, Bld: 101 mg/dL — ABNORMAL HIGH (ref 70–99)
Potassium: 7.2 mmol/L (ref 3.5–5.1)
Sodium: 128 mmol/L — ABNORMAL LOW (ref 135–145)
Total Bilirubin: 1.2 mg/dL (ref 0.3–1.2)
Total Protein: 6.7 g/dL (ref 6.5–8.1)

## 2019-06-09 MED ORDER — SODIUM CHLORIDE 0.9 % IV SOLN: Freq: Once | INTRAVENOUS | Status: DC

## 2019-06-09 MED ORDER — CALCIUM GLUCONATE-NACL 1-0.675 GM/50ML-% IV SOLN
1.0000 g | Freq: Once | INTRAVENOUS | Status: AC
  Administered 2019-06-09: 1000 mg via INTRAVENOUS
  Filled 2019-06-09: qty 50

## 2019-06-09 MED ORDER — SODIUM CHLORIDE 0.9 % IV BOLUS
1000.0000 mL | Freq: Once | INTRAVENOUS | Status: AC
  Administered 2019-06-09: 1000 mL via INTRAVENOUS

## 2019-06-09 MED ORDER — INSULIN ASPART 100 UNIT/ML IV SOLN
5.0000 [IU] | Freq: Once | INTRAVENOUS | Status: AC
  Administered 2019-06-09: 5 [IU] via INTRAVENOUS

## 2019-06-09 MED ORDER — SODIUM ZIRCONIUM CYCLOSILICATE 5 G PO PACK
10.0000 g | PACK | Freq: Every day | ORAL | Status: DC
  Administered 2019-06-09: 10 g via ORAL
  Filled 2019-06-09 (×2): qty 2

## 2019-06-09 MED ORDER — SODIUM CHLORIDE 0.9 % IV BOLUS
500.0000 mL | Freq: Once | INTRAVENOUS | Status: AC
  Administered 2019-06-09: 500 mL via INTRAVENOUS

## 2019-06-09 MED ORDER — DEXTROSE 50 % IV SOLN
1.0000 | Freq: Once | INTRAVENOUS | Status: AC
  Administered 2019-06-09: 50 mL via INTRAVENOUS
  Filled 2019-06-09: qty 50

## 2019-06-09 MED ORDER — SODIUM ZIRCONIUM CYCLOSILICATE 10 G PO PACK
10.0000 g | PACK | ORAL | Status: AC
  Administered 2019-06-09 – 2019-06-10 (×2): 10 g via ORAL
  Filled 2019-06-09 (×2): qty 2

## 2019-06-09 NOTE — ED Notes (Signed)
Date and time results received: 06/09/19 1849   Test: potassium Critical Value: 7.2  Name of Provider Notified: Dr Jacqulyn Bath

## 2019-06-09 NOTE — Progress Notes (Deleted)
{Choose 1 Note Type (Video or Telephone):[phone]}   The patient was identified using 2 identifiers.  Date:  06/09/2019   ID:  [name], DOB [birth year], MRN [id number]  {Patient [phone]::"Home"} {Provider Location:[phone]::"Home"}  PCP:  Raliegh Ip, DO  Cardiologist:  No primary care provider on file. *** Electrophysiologist:  None   Evaluation Performed:  {Choose Visit [phone]::"Follow-Up Visit"}  Chief Complaint:  ***  History of Present Illness:    [name] is a 86 y.o. female with ***  The patient {does/does not:200015} have symptoms concerning for COVID-19 infection (fever, chills, cough, or new shortness of breath).    Past Medical History:  Diagnosis Date  . Achalasia of cardia   . Acute respiratory failure (HCC)   . AF (atrial fibrillation) (HCC)   . Arthritis   . CHF (congestive heart failure) (HCC)   . Chronic venous hypertension   . Diaphragmatic hernia without mention of obstruction or gangrene   . Esophagitis   . First degree atrioventricular block   . Gastric polyp   . GERD (gastroesophageal reflux disease)   . Heme positive stool   . Hyperlipidemia   . Hyperlipidemia   . Hypertension   . Hypothyroid   . Pericardial effusion   . Prolapse of vaginal walls without mention of uterine prolapse   . Rectal polyp   . Vitamin D deficiency    Past Surgical History:  Procedure Laterality Date  . DILATION AND CURETTAGE OF UTERUS    . KIDNEY STONE SURGERY       Current Facility-Administered Medications for the 06/10/19 encounter (Appointment) with Rollene Rotunda, MD  Medication  . cyanocobalamin ((VITAMIN B-12)) injection 1,000 mcg   No outpatient medications have been marked as taking for the 06/10/19 encounter (Appointment) with Rollene Rotunda, MD.     Allergies:   Aspirin, Codeine, Sulfa antibiotics, Vicodin [hydrocodone-acetaminophen], Warfarin sodium, Cephalexin, and Doxycycline   Social History    Tobacco Use  . Smoking status: Never Smoker  . Smokeless tobacco: Never Used  Substance Use Topics  . Alcohol use: No  . Drug use: No     Family Hx: The patient's family history includes Aneurysm in her father and mother; Arthritis in her daughter; Cancer in her daughter; Heart attack in her daughter; Hyperlipidemia in her sister; Hypertension in her father and mother; Kidney disease in her father.  ROS:   Please see the history of present illness.    *** All other systems reviewed and are negative.   Prior CV studies:   The following studies were reviewed today:  ***  Labs/Other Tests and Data Reviewed:    EKG:  {EKG/Telemetry Strips [phone]}  Recent Labs: 05/19/2019: B Natriuretic Peptide 359.0; TSH 9.656 06/09/2019: ALT 21; BUN 112; Creatinine, Ser 6.12; Hemoglobin 11.5; Magnesium 2.9; Platelets 169; Potassium 7.2; Sodium 128   Recent Lipid Panel Lab Results  Component Value Date/Time   CHOL 150 09/19/2018 09:18 AM   CHOL 176 10/01/2012 12:07 PM   TRIG 79 09/19/2018 09:18 AM   TRIG 149 04/21/2015 10:17 AM   TRIG 104 10/01/2012 12:07 PM   HDL 73 09/19/2018 09:18 AM   HDL 54 04/21/2015 10:17 AM   HDL 47 10/01/2012 12:07 PM   CHOLHDL 2.1 09/19/2018 09:18 AM   LDLCALC 61 09/19/2018 09:18 AM   LDLCALC 123 (H) 10/26/2013 10:59 AM   LDLCALC 108 (H) 10/01/2012 12:07 PM    Wt Readings from Last 3 Encounters:  06/09/19 163 lb 2.3 oz (74 kg)  06/01/19 164 lb 10.9 oz (74.7 kg)  05/19/19 210 lb 12.8 oz (95.6 kg)     Objective:    Vital Signs:  There were no vitals taken for this visit.   {[facility] Virtual Exam (Optional):[id number]::"VITAL SIGNS:  reviewed"}  ASSESSMENT & PLAN:    1. ***  COVID-19 Education: The signs and symptoms of COVID-19 were discussed with the patient and how to seek care for testing (follow up with PCP or arrange E-visit).  ***The importance of social distancing was discussed today.  Time:   Today, I have spent *** minutes  with the patient with telehealth technology discussing the above problems.     Medication Adjustments/Labs and Tests Ordered: Current medicines are reviewed at length with the patient today.  Concerns regarding medicines are outlined above.   Tests Ordered: No orders of the defined types were placed in this encounter.   Medication Changes: No orders of the defined types were placed in this encounter.   Follow Up:  {F/U [phone]} {follow up:15908}  Signed, Minus Breeding, MD  06/09/2019 8:57 PM    [facility]

## 2019-06-09 NOTE — ED Notes (Signed)
14FR 5cc foley cath inserted without issues.

## 2019-06-09 NOTE — ED Triage Notes (Signed)
PT resident of [facility].  Staff reports pt's K 7.3, BUN 98.0, creat 5.78.  EMS says pt usually alert and oriented but decreased loc today.  LKW unknown per ems.

## 2019-06-09 NOTE — H&P (Addendum)
History and Physical    [name]:[id number] DOB: 1932/09/22 DOA: 06/09/2019  PCP: Raliegh Ip, DO   Patient coming from: [facility].  I have personally briefly reviewed patient's old medical records in [facility]  Chief Complaint: Altered mental status.  HPI: [name] is a 86 y.o. female with medical history significant for atrial fibrillation, hypertension, congestive heart failure. Patient was brought to the ED today from nursing home reports of altered mental status, staff at nursing reported that patient had decreased level of consciousness today, usually alert and oriented.  Blood work done today at this nursing home showed potassium of 7.3, and creatinine of 5.78.  Recent hospitalization 2/16-06/02/19-for acute hypoxic respiratory failure secondary to congestive heart failure with preserved ejection fraction, improved with IV diuresis. Patient was discharged home on her previous dose of Lasix 40 mg daily, with some potassium supplementation.  ED Course: Heart rate 66, temperature 97.9.  O2 sats 95% on nasal cannula 2 L.  Blood pressure 115/74, my evaluation systolic was in the 80s.  Creatinine markedly elevated at 6.12.  Potassium 7.2.  Sodium 128.  EKG showing atrial fibrillation, QRS 160.  QTc 498. D50 and 5 units of NovoLog given. EDP talked to nephrologist on-call Dr. Allena Katz, recommended giving [facility] every 4 hourly x3 doses and recheck potassium at midnight.  Patient was seen in the morning.  Hospitalist to admit for further evaluation and management.  Review of Systems: Unable to ascertain due to altered mental status.  Past Medical History:  Diagnosis Date  . Achalasia of cardia   . Acute respiratory failure (HCC)   . AF (atrial fibrillation) (HCC)   . Arthritis   . CHF (congestive heart failure) (HCC)   . Chronic venous hypertension   . Diaphragmatic hernia without mention of obstruction or gangrene   . Esophagitis   . First degree  atrioventricular block   . Gastric polyp   . GERD (gastroesophageal reflux disease)   . Heme positive stool   . Hyperlipidemia   . Hyperlipidemia   . Hypertension   . Hypothyroid   . Pericardial effusion   . Prolapse of vaginal walls without mention of uterine prolapse   . Rectal polyp   . Vitamin D deficiency     Past Surgical History:  Procedure Laterality Date  . DILATION AND CURETTAGE OF UTERUS    . KIDNEY STONE SURGERY       reports that she has never smoked. She has never used smokeless tobacco. She reports that she does not drink alcohol or use drugs.  Allergies  Allergen Reactions  . Aspirin Nausea Only  . Codeine Nausea Only  . Sulfa Antibiotics   . Vicodin [Hydrocodone-Acetaminophen] Nausea Only  . Warfarin Sodium Other (See Comments)    Tingling all over  . Cephalexin Rash    Red, rash covering lower limbs.  . Doxycycline Rash    Family History  Problem Relation Age of Onset  . Hypertension Mother   . Aneurysm Mother        Brain  . Hypertension Father   . Kidney disease Father   . Aneurysm Father        heart  . Arthritis Daughter   . Cancer Daughter        breast  . Heart attack Daughter   . Hyperlipidemia Sister     Prior to Admission medications   Medication Sig Start Date End Date Taking? Authorizing Provider  Amino Acids-Protein Hydrolys (FEEDING SUPPLEMENT,  PRO-STAT SUGAR FREE 64,) LIQD Take 30 mLs by mouth 2 (two) times daily. 06/01/19  Yes Shah, Pratik D, DO  calcium-vitamin D (OSCAL WITH D) 500-200 MG-UNIT per tablet Take 1 tablet by mouth daily.   Yes [provider]  Cholecalciferol (VITAMIN D3) 2000 UNITS TABS Take 1 tablet by mouth daily.     Yes [provider]  ELIQUIS 5 MG TABS tablet TAKE  (1)  TABLET TWICE A DAY. Patient taking differently: Take 5 mg by mouth 2 (two) times daily.  05/14/19  Yes Minus Breeding, MD  furosemide (LASIX) 40 MG tablet TAKE 1 TABLET DAILY. Patient taking differently: Take 40 mg by mouth  daily.  04/24/19  Yes Gottschalk, Leatrice Jewels M, DO  gabapentin (NEURONTIN) 100 MG capsule Needs to be seen for further refills. One by mouth twice daily. Patient taking differently: Take 100 mg by mouth daily.  05/14/19  Yes Ronnie Doss M, DO  iron polysaccharides (FERREX 150) 150 MG capsule Take 1 capsule (150 mg total) by mouth daily. 03/03/19  Yes Gottschalk, Ashly M, DO  KLOR-CON 10 10 MEQ tablet TAKE 1 TABLET DAILY Patient taking differently: Take 10 mEq by mouth at bedtime.  05/14/19  Yes Ronnie Doss M, DO  levothyroxine (SYNTHROID) 137 MCG tablet Take 1 tablet (137 mcg total) by mouth daily. as directed 11/10/18  Yes Gottschalk, Ashly M, DO  lisinopril (ZESTRIL) 40 MG tablet TAKE 1 TABLET DAILY Patient taking differently: Take 40 mg by mouth daily.  04/24/19  Yes Ronnie Doss M, DO  Multiple Vitamins-Minerals (CENTRUM SILVER) tablet Take 1 tablet by mouth daily.    Yes [provider]  omeprazole (PRILOSEC) 20 MG capsule TAKE 1 CAPSULE BY MOUTH TWICE DAILY BEFORE A MEAL (Needs to be seen before next refill) Patient taking differently: Take 20 mg by mouth daily.  04/24/19  Yes Gottschalk, Ashly M, DO  polyethylene glycol powder (GLYCOLAX/MIRALAX) powder DISSOLVE 17 GRAMS (ONE CAPFUL) OF POWDER IN WATER & DRINK ONCE DAILY AS NEEDED Patient taking differently: Take 17 g by mouth daily as needed for mild constipation or moderate constipation.  01/08/17  Yes Chipper Herb, MD  albuterol (PROVENTIL HFA;VENTOLIN HFA) 108 (90 Base) MCG/ACT inhaler Inhale 2 puffs into the lungs every 6 (six) hours as needed for wheezing or shortness of breath. 04/11/18   Chipper Herb, MD  feeding supplement, ENSURE ENLIVE, (ENSURE ENLIVE) LIQD Take 237 mLs by mouth 2 (two) times daily between meals. 06/01/19   Heath Lark D, DO    Physical Exam: Exam limited by patient's mental status. Vitals:   06/09/19 1718 06/09/19 1722  BP:  115/74  Pulse:  66  Resp:  18  Temp:  97.9 F (36.6 C)  TempSrc:   Oral  SpO2:  95%  Weight: 74 kg   Height: 5\' 4"  (1.626 m)     Constitutional: Somnolent, moans, moves extremities to pain, maintaining airway Vitals:   06/09/19 1718 06/09/19 1722  BP:  115/74  Pulse:  66  Resp:  18  Temp:  97.9 F (36.6 C)  TempSrc:  Oral  SpO2:  95%  Weight: 74 kg   Height: 5\' 4"  (1.626 m)    Eyes: Unable to evaluate, patient consciously squeezing her eyes tightly closed. ENMT: Mucous membranes are dry Neck: normal, supple, no masses, no thyromegaly Respiratory: clear to auscultation bilaterally, no wheezing, no crackles.  Cardiovascular: Regular rate and rhythm, No extremity edema. 2+ pedal pulses.  Abdomen: no tenderness, no masses palpated. No hepatosplenomegaly.  Bowel sounds positive.  Musculoskeletal: no clubbing / cyanosis. No joint deformity upper and lower extremities. Good ROM, no contractures. Normal muscle tone.  Skin: no rashes, lesions, ulcers. No induration Neurologic: Somnolent, moves extremities and moans  to pain. Psychiatric: Somnolent.  Unable to assess.  Labs on Admission: I have personally reviewed following labs and imaging studies  CBC: Recent Labs  Lab 06/09/19 1740  WBC 5.3  NEUTROABS 3.9  HGB 11.5*  HCT 36.1  MCV 97.6  PLT 169   Basic Metabolic Panel: Recent Labs  Lab 06/09/19 1740  NA 128*  K 7.2*  CL 84*  CO2 34*  GLUCOSE 101*  BUN 112*  CREATININE 6.12*  CALCIUM 8.8*  MG 2.9*   Liver Function Tests: Recent Labs  Lab 06/09/19 1740  AST 32  ALT 21  ALKPHOS 135*  BILITOT 1.2  PROT 6.7  ALBUMIN 3.3*    EKG: Independently reviewed.  Atrial fibrillation, QRS 160.  QTc 498.  PVC present.  Assessment/Plan Active Problems:   AF (atrial fibrillation) (HCC)   Hyperkalemia   Chronic diastolic CHF (congestive heart failure) (HCC)   Acute metabolic encephalopathy   AKI (acute kidney injury) (HCC)    Acute metabolic encephalopathy-somnolent at this time, protecting her airway.  Likely secondary to uremia  from AKI on CKD, and dehydration, also currently hypotensive.  She is on anticoagulation. -Obtain Stat head CT-unremarkable. -Aspiration precautions -N.p.o. for now, till improvement in mental status -Hold home psychoactive agents gabapentin.  Acute kidney injury on CKD 3b -creatinine 6.12, baseline 1.1 to 1.2 just 8 days ago.  Likely from diuretics, lisinopril 40 mg daily.  Currently hypotensive. -500 mill bolus given, add 1 L -BMP a.m. - Cont N/s 75cc/hr  - Hold Lasix, lisinopril.  Electrolyte abnormalities- marked hyperkalemia at 7.2- likely from worsening renal insufficiency in the setting of use of lisinopril and low-dose potassium supplements 10 mg daily, also-  hyponatremia 128-  Likely from diuretics. -D50 and 5 units of NovoLog given in ED. - Hold home Lasix - EDP talked to nephrologist Dr. Allena Katz, will see in a.m., Lokelma every 4 hourly x3 doses - Recheck potassium at midnight -Hold Lasix, lisinopril, potassium supplements - Calcium gluconate 1g x 1  Chronic diastolic CHF- appears stable, but currently hypotensive with acute kidney injury. -Hold Lasix.  Permanent atrial fibrillation-rate controlled but not on rate limiting agents.  Hemoglobin stable. -Hold home Eliquis for now with altered mental status, last Eliquis dose was today.  Recent Goals of care discussions and DNR status.    DVT prophylaxis: Heparin s/c Code Status: DNR DNI Family Communication: None at bedside.  Called patient's son- Liala Codispoti, listed on demographics, no response. Disposition Plan: > 2 days Consults called: Nephrology Admission status: Inpatient, stepdown I certify that at the point of admission it is my clinical judgment that the patient will require inpatient hospital care spanning beyond 2 midnights from the point of admission due to high intensity of service, high risk for further deterioration and high frequency of surveillance required.   Onnie Boer MD [facility]  06/09/2019, 10:49 PM

## 2019-06-09 NOTE — ED Provider Notes (Signed)
Emergency Department Provider Note   I have reviewed the triage vital signs and the nursing notes.   HISTORY  Chief Complaint Altered Mental Status and hyperkalemia   HPI [name] is a 86 y.o. female with CKD and other PMH reviewed below presents to the emergency department with altered mental status from nursing home with lab work showing worsening renal failure and hyperkalemia.  Patient was admitted late February/early March when she presented with volume overload and respiratory failure.  She diuresed significantly over that time and was ultimately discharged on Lasix 40 mg daily and potassium supplementation.  The nursing facility found her to be more sleepy than usual and sent lab work showing worsening renal compromise and hyperkalemia.  She was sent to the ED for further evaluation.  Level 5 caveat: Encephalopathy.  Code Status: DNR   Past Medical History:  Diagnosis Date  . Achalasia of cardia   . Acute respiratory failure (HCC)   . AF (atrial fibrillation) (HCC)   . Arthritis   . CHF (congestive heart failure) (HCC)   . Chronic venous hypertension   . Diaphragmatic hernia without mention of obstruction or gangrene   . Esophagitis   . First degree atrioventricular block   . Gastric polyp   . GERD (gastroesophageal reflux disease)   . Heme positive stool   . Hyperlipidemia   . Hyperlipidemia   . Hypertension   . Hypothyroid   . Pericardial effusion   . Prolapse of vaginal walls without mention of uterine prolapse   . Rectal polyp   . Vitamin D deficiency     Patient Active Problem List   Diagnosis Date Noted  . Acute renal failure (HCC)   . Encounter for hospice care discussion   . Palliative care encounter 06/10/2019  . Hyperkalemia 06/09/2019  . Chronic diastolic CHF (congestive heart failure) (HCC) 06/09/2019  . Acute metabolic encephalopathy 06/09/2019  . Acute kidney injury superimposed on CKD (HCC) 06/09/2019  . Pericardial effusion   .  Palliative care by specialist   . Goals of care, counseling/discussion   . Bilateral pleural effusion   . Acute on chronic respiratory failure (HCC)   . Dysphagia   . Achalasia   . Acute congestive heart failure (HCC) 05/20/2019  . Volume overload 05/20/2019  . Acute hypoxemic respiratory failure (HCC) 05/19/2019  . Venous stasis of both lower extremities 01/13/2019  . Generalized anxiety disorder 11/07/2018  . Non-rheumatic mitral regurgitation 03/20/2017  . Thrombocytopenia (HCC) 09/14/2016  . Hemoglobin decreased 09/14/2016  . Hearing loss 01/06/2015  . Major depressive disorder, recurrent episode, moderate (HCC) 06/07/2014  . Thoracic aorta atherosclerosis (HCC) 03/04/2014  . Degenerative arthritis of thoracic spine 03/04/2014  . Osteoporosis, post-menopausal 08/12/2013  . Metabolic syndrome 06/23/2013  . Anemia, iron deficiency 03/16/2013  . Vitamin D deficiency 03/16/2013  . HTN (hypertension) 03/16/2013  . Hypothyroidism   . Diaphragmatic hernia without mention of obstruction or gangrene   . GERD (gastroesophageal reflux disease)   . Hyperlipidemia   . Prolapse of vaginal walls without mention of uterine prolapse   . Arthritis   . First degree atrioventricular block   . Symptomatic menopausal or female climacteric states   . AF (atrial fibrillation) (HCC)   . Rectal polyp   . Gastric polyp     Past Surgical History:  Procedure Laterality Date  . DILATION AND CURETTAGE OF UTERUS    . KIDNEY STONE SURGERY      Allergies Aspirin, Codeine, Sulfa antibiotics, Vicodin [hydrocodone-acetaminophen],  Warfarin sodium, Cephalexin, and Doxycycline  Family History  Problem Relation Age of Onset  . Hypertension Mother   . Aneurysm Mother        Brain  . Hypertension Father   . Kidney disease Father   . Aneurysm Father        heart  . Arthritis Daughter   . Cancer Daughter        breast  . Heart attack Daughter   . Hyperlipidemia Sister     Social History Social  History   Tobacco Use  . Smoking status: Never Smoker  . Smokeless tobacco: Never Used  Substance Use Topics  . Alcohol use: No  . Drug use: No    Review of Systems  Level 5 caveat: Encephalopathy   ____________________________________________   PHYSICAL EXAM:  VITAL SIGNS: ED Triage Vitals  Enc Vitals Group     BP 06/09/19 1722 115/74     Pulse Rate 06/09/19 1722 66     Resp 06/09/19 1722 18     Temp 06/09/19 1722 97.9 F (36.6 C)     Temp Source 06/09/19 1722 Oral     SpO2 06/09/19 1722 95 %     Weight 06/09/19 1718 163 lb 2.3 oz (74 kg)     Height 06/09/19 1718 5\' 4"  (1.626 m)   Constitutional: Opens eyes to voice and following basic commands.  Eyes: Conjunctivae are normal. PERRL.  Head: Atraumatic. Nose: No congestion/rhinnorhea. Mouth/Throat: Mucous membranes are dry. Neck: No stridor.  Cardiovascular: Normal rate, regular rhythm. Good peripheral circulation. Grossly normal heart sounds.   Respiratory: Normal respiratory effort.  No retractions. Lungs CTAB. Gastrointestinal: Soft and nontender. No distention.  Musculoskeletal: No lower extremity tenderness nor edema. No gross deformities of extremities. Neurologic: No gross focal neurologic deficits are appreciated.  Skin:  Skin is warm, dry and intact. No rash noted.  ____________________________________________   LABS (all labs ordered are listed, but only abnormal results are displayed)  Labs Reviewed  URINE CULTURE - Abnormal; Notable for the following components:      Result Value   Culture   (*)    Value: >=100,000 COLONIES/mL ESCHERICHIA COLI >=100,000 COLONIES/mL KLEBSIELLA PNEUMONIAE SUSCEPTIBILITIES TO FOLLOW Performed at [facility], [address] [place], [place]    All other components within normal limits  COMPREHENSIVE METABOLIC PANEL - Abnormal; Notable for the following components:   Sodium 128 (*)    Potassium 7.2 (*)    Chloride 84 (*)    CO2 34 (*)     Glucose, Bld 101 (*)    BUN 112 (*)    Creatinine, Ser 6.12 (*)    Calcium 8.8 (*)    Albumin 3.3 (*)    Alkaline Phosphatase 135 (*)    GFR calc non Af Amer 6 (*)    GFR calc Af Amer 7 (*)    All other components within normal limits  CBC WITH DIFFERENTIAL/PLATELET - Abnormal; Notable for the following components:   RBC 3.70 (*)    Hemoglobin 11.5 (*)    RDW 15.6 (*)    All other components within normal limits  MAGNESIUM - Abnormal; Notable for the following components:   Magnesium 2.9 (*)    All other components within normal limits  BASIC METABOLIC PANEL - Abnormal; Notable for the following components:   Sodium 132 (*)    Potassium 6.5 (*)    Chloride 88 (*)    CO2 35 (*)    Glucose, Bld  55 (*)    BUN 111 (*)    Creatinine, Ser 6.19 (*)    Calcium 8.6 (*)    GFR calc non Af Amer 6 (*)    GFR calc Af Amer 7 (*)    All other components within normal limits  BASIC METABOLIC PANEL - Abnormal; Notable for the following components:   Sodium 132 (*)    Potassium 6.7 (*)    Chloride 89 (*)    CO2 35 (*)    BUN 105 (*)    Creatinine, Ser 5.96 (*)    Calcium 8.5 (*)    GFR calc non Af Amer 6 (*)    GFR calc Af Amer 7 (*)    All other components within normal limits  CBC - Abnormal; Notable for the following components:   RBC 3.34 (*)    Hemoglobin 10.4 (*)    HCT 33.3 (*)    All other components within normal limits  URINALYSIS, ROUTINE W REFLEX MICROSCOPIC - Abnormal; Notable for the following components:   APPearance TURBID (*)    Hgb urine dipstick MODERATE (*)    Ketones, ur 5 (*)    Protein, ur 100 (*)    Leukocytes,Ua MODERATE (*)    WBC, UA >50 (*)    Non Squamous Epithelial 6-10 (*)    All other components within normal limits  RENAL FUNCTION PANEL - Abnormal; Notable for the following components:   Sodium 132 (*)    Potassium 6.1 (*)    Chloride 89 (*)    CO2 33 (*)    BUN 112 (*)    Creatinine, Ser 5.89 (*)    Calcium 8.8 (*)    Phosphorus 6.0 (*)      Albumin 3.0 (*)    GFR calc non Af Amer 6 (*)    GFR calc Af Amer 7 (*)    All other components within normal limits  RENAL FUNCTION PANEL - Abnormal; Notable for the following components:   Sodium 133 (*)    Potassium 6.1 (*)    Chloride 91 (*)    CO2 33 (*)    BUN 113 (*)    Creatinine, Ser 5.86 (*)    Phosphorus 5.4 (*)    Albumin 2.9 (*)    GFR calc non Af Amer 6 (*)    GFR calc Af Amer 7 (*)    All other components within normal limits  BLOOD GAS, ARTERIAL - Abnormal; Notable for the following components:   pH, Arterial 7.314 (*)    pCO2 arterial 70.1 (*)    pO2, Arterial 79.9 (*)    Bicarbonate 30.8 (*)    Acid-Base Excess 8.4 (*)    All other components within normal limits  COMPREHENSIVE METABOLIC PANEL - Abnormal; Notable for the following components:   Potassium 5.5 (*)    Chloride 90 (*)    CO2 34 (*)    BUN 116 (*)    Creatinine, Ser 5.75 (*)    Calcium 8.8 (*)    Total Protein 5.7 (*)    Albumin 2.7 (*)    Total Bilirubin 1.3 (*)    GFR calc non Af Amer 6 (*)    GFR calc Af Amer 7 (*)    All other components within normal limits  CBC - Abnormal; Notable for the following components:   WBC 3.5 (*)    RBC 3.30 (*)    Hemoglobin 10.2 (*)    HCT 33.1 (*)  MCV 100.3 (*)    Platelets 130 (*)    All other components within normal limits  GLUCOSE, CAPILLARY - Abnormal; Notable for the following components:   Glucose-Capillary 105 (*)    All other components within normal limits  MRSA PCR SCREENING  GLUCOSE, CAPILLARY  GLUCOSE, CAPILLARY  GLUCOSE, CAPILLARY  GLUCOSE, CAPILLARY   ____________________________________________  EKG   EKG Interpretation  Date/Time:  Tuesday June 09 2019 17:25:18 EST Ventricular Rate:  101 PR Interval:    QRS Duration: 160 QT Interval:  455 QTC Calculation: 498 R Axis:   -56 Text Interpretation: Atrial fibrillation Ventricular premature complex Nonspecific IVCD with LAD Probable left ventricular hypertrophy No  STEMI Confirmed by Nanda Quinton [phone]) on 06/09/2019 5:36:47 PM       ____________________________________________  RADIOLOGY  CT head reviewed.  ____________________________________________   PROCEDURES  Procedure(s) performed:   Procedures  CRITICAL CARE Performed by: Margette Fast Total critical care time: 40 minutes Critical care time was exclusive of separately billable procedures and treating other patients. Critical care was necessary to treat or prevent imminent or life-threatening deterioration. Critical care was time spent personally by me on the following activities: development of treatment plan with patient and/or surrogate as well as nursing, discussions with consultants, evaluation of patient's response to treatment, examination of patient, obtaining history from patient or surrogate, ordering and performing treatments and interventions, ordering and review of laboratory studies, ordering and review of radiographic studies, pulse oximetry and re-evaluation of patient's condition.  Nanda Quinton, MD Emergency Medicine  ____________________________________________   INITIAL IMPRESSION / ASSESSMENT AND PLAN / ED COURSE  Pertinent labs & imaging results that were available during my care of the patient were reviewed by me and considered in my medical decision making (see chart for details).   Patient presents to the emergency department for evaluation of acute renal failure with hyperkalemia.  Lab work here confirms this diagnosis.  No significant EKG changes as it appears similar to her February tracing.  Patient does appear volume down clinically.  Will give IV fluid bolus along with insulin/D50, and Lokelma.  Will discuss with nephrology given the significantly elevated potassium and place Foley catheter.   Spoke with Dr. Posey Pronto with [facility]. Plan for repeat chemistry at midnight, IVF,and foley placement which were done. Patient unable to provide urine  sample.  Discussed patient's case with [facility] to request admission. Patient and family (if present) updated with plan. Care transferred to [facility] service.  I reviewed all nursing notes, vitals, pertinent old records, EKGs, labs, imaging (as available).  ____________________________________________  FINAL CLINICAL IMPRESSION(S) / ED DIAGNOSES  Final diagnoses:  Acute renal failure, unspecified acute renal failure type ([place])  Hyperkalemia     MEDICATIONS GIVEN DURING THIS VISIT:  Medications  sodium zirconium cyclosilicate (LOKELMA) packet 10 g (10 g Oral Not Given 06/10/19 0943)  heparin injection 5,000 Units (5,000 Units Subcutaneous Given 06/11/19 1319)  Chlorhexidine Gluconate Cloth 2 % PADS 6 each (6 each Topical Given 06/11/19 0915)  aztreonam (AZACTAM) 0.5 g in dextrose 5 % 50 mL IVPB ( Intravenous Stopped 06/11/19 1352)  MEDLINE mouth rinse (has no administration in time range)  dextrose 50 % solution 50 mL (50 mLs Intravenous Given 06/09/19 1916)  insulin aspart (novoLOG) injection 5 Units (5 Units Intravenous Given 06/09/19 1916)  sodium chloride 0.9 % bolus 500 mL (0 mLs Intravenous Stopped 06/09/19 1945)  sodium chloride 0.9 % bolus 1,000 mL (0 mLs Intravenous Stopped 06/09/19 2351)  calcium gluconate 1 g/  50 mL sodium chloride IVPB (0 g Intravenous Stopped 06/09/19 2348)  calcium gluconate 1 g/ 50 mL sodium chloride IVPB (0 mg Intravenous Stopped 06/10/19 1100)  albuterol (PROVENTIL) (2.5 MG/3ML) 0.083% nebulizer solution 5 mg (5 mg Nebulization Given 06/10/19 0949)  sodium zirconium cyclosilicate (LOKELMA) packet 10 g (10 g Oral Given 06/10/19 1749)  sodium zirconium cyclosilicate (LOKELMA) packet 10 g (10 g Oral Given 06/11/19 1630)  calcium gluconate 1 g/ 50 mL sodium chloride IVPB ( Intravenous Stopped 06/11/19 1020)     Note:  This document was prepared using Dragon voice recognition software and may include unintentional dictation errors.  Alona Bene, MD, [facility] Emergency Medicine     Grant Swager, Arlyss Repress, MD 06/11/19 1911

## 2019-06-10 ENCOUNTER — Telehealth: Payer: Medicare Other | Admitting: Cardiology

## 2019-06-10 ENCOUNTER — Inpatient Hospital Stay (HOSPITAL_COMMUNITY): Payer: Medicare Other

## 2019-06-10 DIAGNOSIS — Z515 Encounter for palliative care: Secondary | ICD-10-CM

## 2019-06-10 DIAGNOSIS — G9341 Metabolic encephalopathy: Secondary | ICD-10-CM

## 2019-06-10 DIAGNOSIS — N189 Chronic kidney disease, unspecified: Secondary | ICD-10-CM

## 2019-06-10 DIAGNOSIS — Z7189 Other specified counseling: Secondary | ICD-10-CM

## 2019-06-10 DIAGNOSIS — N179 Acute kidney failure, unspecified: Principal | ICD-10-CM

## 2019-06-10 LAB — RENAL FUNCTION PANEL
Albumin: 2.9 g/dL — ABNORMAL LOW (ref 3.5–5.0)
Albumin: 3 g/dL — ABNORMAL LOW (ref 3.5–5.0)
Anion gap: 9 (ref 5–15)
Anion gap: 10 (ref 5–15)
BUN: 113 mg/dL — ABNORMAL HIGH (ref 8–23)
BUN: 112 mg/dL — ABNORMAL HIGH (ref 8–23)
CO2: 33 mmol/L — ABNORMAL HIGH (ref 22–32)
CO2: 33 mmol/L — ABNORMAL HIGH (ref 22–32)
Calcium: 8.9 mg/dL (ref 8.9–10.3)
Calcium: 8.8 mg/dL — ABNORMAL LOW (ref 8.9–10.3)
Chloride: 91 mmol/L — ABNORMAL LOW (ref 98–111)
Chloride: 89 mmol/L — ABNORMAL LOW (ref 98–111)
Creatinine, Ser: 5.86 mg/dL — ABNORMAL HIGH (ref 0.44–1.00)
Creatinine, Ser: 5.89 mg/dL — ABNORMAL HIGH (ref 0.44–1.00)
GFR calc Af Amer: 7 mL/min — ABNORMAL LOW (ref >60)
GFR calc Af Amer: 7 mL/min — ABNORMAL LOW (ref >60)
GFR calc non Af Amer: 6 mL/min — ABNORMAL LOW (ref >60)
GFR calc non Af Amer: 6 mL/min — ABNORMAL LOW (ref >60)
Glucose, Bld: 85 mg/dL (ref 70–99)
Glucose, Bld: 90 mg/dL (ref 70–99)
Phosphorus: 5.4 mg/dL — ABNORMAL HIGH (ref 2.5–4.6)
Phosphorus: 6 mg/dL — ABNORMAL HIGH (ref 2.5–4.6)
Potassium: 6.1 mmol/L — ABNORMAL HIGH (ref 3.5–5.1)
Potassium: 6.1 mmol/L — ABNORMAL HIGH (ref 3.5–5.1)
Sodium: 133 mmol/L — ABNORMAL LOW (ref 135–145)
Sodium: 132 mmol/L — ABNORMAL LOW (ref 135–145)

## 2019-06-10 LAB — BLOOD GAS, ARTERIAL
Acid-Base Excess: 8.4 mmol/L — ABNORMAL HIGH (ref 0.0–2.0)
Bicarbonate: 30.8 mmol/L — ABNORMAL HIGH (ref 20.0–28.0)
FIO2: 28
O2 Saturation: 95.7 %
Patient temperature: 36.3
pCO2 arterial: 70.1 mmHg (ref 32.0–48.0)
pH, Arterial: 7.314 — ABNORMAL LOW (ref 7.350–7.450)
pO2, Arterial: 79.9 mmHg — ABNORMAL LOW (ref 83.0–108.0)

## 2019-06-10 LAB — BASIC METABOLIC PANEL
Anion gap: 9 (ref 5–15)
Anion gap: 8 (ref 5–15)
BUN: 111 mg/dL — ABNORMAL HIGH (ref 8–23)
BUN: 105 mg/dL — ABNORMAL HIGH (ref 8–23)
CO2: 35 mmol/L — ABNORMAL HIGH (ref 22–32)
CO2: 35 mmol/L — ABNORMAL HIGH (ref 22–32)
Calcium: 8.6 mg/dL — ABNORMAL LOW (ref 8.9–10.3)
Calcium: 8.5 mg/dL — ABNORMAL LOW (ref 8.9–10.3)
Chloride: 88 mmol/L — ABNORMAL LOW (ref 98–111)
Chloride: 89 mmol/L — ABNORMAL LOW (ref 98–111)
Creatinine, Ser: 6.19 mg/dL — ABNORMAL HIGH (ref 0.44–1.00)
Creatinine, Ser: 5.96 mg/dL — ABNORMAL HIGH (ref 0.44–1.00)
GFR calc Af Amer: 7 mL/min — ABNORMAL LOW (ref >60)
GFR calc Af Amer: 7 mL/min — ABNORMAL LOW (ref >60)
GFR calc non Af Amer: 6 mL/min — ABNORMAL LOW (ref >60)
GFR calc non Af Amer: 6 mL/min — ABNORMAL LOW (ref >60)
Glucose, Bld: 55 mg/dL — ABNORMAL LOW (ref 70–99)
Glucose, Bld: 86 mg/dL (ref 70–99)
Potassium: 6.5 mmol/L (ref 3.5–5.1)
Potassium: 6.7 mmol/L (ref 3.5–5.1)
Sodium: 132 mmol/L — ABNORMAL LOW (ref 135–145)
Sodium: 132 mmol/L — ABNORMAL LOW (ref 135–145)

## 2019-06-10 LAB — CBC
HCT: 33.3 % — ABNORMAL LOW (ref 36.0–46.0)
Hemoglobin: 10.4 g/dL — ABNORMAL LOW (ref 12.0–15.0)
MCH: 31.1 pg (ref 26.0–34.0)
MCHC: 31.2 g/dL (ref 30.0–36.0)
MCV: 99.7 fL (ref 80.0–100.0)
Platelets: 156 10*3/uL (ref 150–400)
RBC: 3.34 MIL/uL — ABNORMAL LOW (ref 3.87–5.11)
RDW: 15.3 % (ref 11.5–15.5)
WBC: 4.8 10*3/uL (ref 4.0–10.5)
nRBC: 0 % (ref 0.0–0.2)

## 2019-06-10 LAB — URINALYSIS, ROUTINE W REFLEX MICROSCOPIC
Bacteria, UA: NONE SEEN
Bilirubin Urine: NEGATIVE
Glucose, UA: NEGATIVE mg/dL
Ketones, ur: 5 mg/dL — AB
Nitrite: NEGATIVE
Protein, ur: 100 mg/dL — AB
Specific Gravity, Urine: 1.012 (ref 1.005–1.030)
WBC, UA: >50 WBC/hpf — ABNORMAL HIGH (ref 0–5)
pH: 5 (ref 5.0–8.0)

## 2019-06-10 LAB — MRSA PCR SCREENING: MRSA by PCR: NEGATIVE

## 2019-06-10 LAB — GLUCOSE, CAPILLARY
Glucose-Capillary: 79 mg/dL (ref 70–99)
Glucose-Capillary: 83 mg/dL (ref 70–99)

## 2019-06-10 MED ORDER — ALBUTEROL SULFATE (2.5 MG/3ML) 0.083% IN NEBU
5.0000 mg | INHALATION_SOLUTION | Freq: Once | RESPIRATORY_TRACT | Status: AC
  Administered 2019-06-10: 5 mg via RESPIRATORY_TRACT
  Filled 2019-06-10: qty 6

## 2019-06-10 MED ORDER — CHLORHEXIDINE GLUCONATE CLOTH 2 % EX PADS
6.0000 | MEDICATED_PAD | Freq: Every day | CUTANEOUS | Status: DC
  Administered 2019-06-10 – 2019-06-16 (×7): 6 via TOPICAL

## 2019-06-10 MED ORDER — CALCIUM GLUCONATE-NACL 1-0.675 GM/50ML-% IV SOLN
1.0000 g | Freq: Once | INTRAVENOUS | Status: AC
  Administered 2019-06-10: 1000 mg via INTRAVENOUS
  Filled 2019-06-10: qty 50

## 2019-06-10 MED ORDER — DEXTROSE 5 % IV SOLN
0.5000 g | Freq: Three times a day (TID) | INTRAVENOUS | Status: DC
  Administered 2019-06-10 – 2019-06-12 (×6): 0.5 g via INTRAVENOUS
  Filled 2019-06-10 (×20): qty 0.5

## 2019-06-10 MED ORDER — SODIUM CHLORIDE 0.9 % IV SOLN
1.0000 g | Freq: Two times a day (BID) | INTRAVENOUS | Status: DC
  Filled 2019-06-10 (×3): qty 1

## 2019-06-10 MED ORDER — SODIUM ZIRCONIUM CYCLOSILICATE 10 G PO PACK
10.0000 g | PACK | ORAL | Status: AC
  Administered 2019-06-10 (×3): 10 g via ORAL
  Filled 2019-06-10 (×3): qty 1

## 2019-06-10 MED ORDER — SODIUM CHLORIDE 0.9 % IV SOLN: INTRAVENOUS | Status: DC

## 2019-06-10 MED ORDER — HEPARIN SODIUM (PORCINE) 5000 UNIT/ML IJ SOLN
5000.0000 [IU] | Freq: Three times a day (TID) | INTRAMUSCULAR | Status: DC
  Administered 2019-06-10 – 2019-06-16 (×21): 5000 [IU] via SUBCUTANEOUS
  Filled 2019-06-10 (×20): qty 1

## 2019-06-10 NOTE — ED Notes (Addendum)
Date and time results received: 06/10/19 0008  (use smartphrase ".now" to insert current time)  Test: Potassium Critical Value: 6.5  Name of Provider Notified: [place]  Orders Received? Or Actions Taken?:

## 2019-06-10 NOTE — Progress Notes (Signed)
Palliative: [name] is lying quietly in bed.  She appears acutely/chronically ill and frail.  She is lethargic, but wakes to tell me her name.  She is unable to tell me where we are, but I reassure her that she is safe.  I am not sure that she can make her basic needs known.  There is no family at bedside at this time.  Call to healthcare power of attorney, son, [name] at [phone], unable to leave voicemail message.  Call to home #[phone].  [name] tells me that he is on the other line with the physician at this point.  [name] asks about visitation policy, information provided.  [name] shares that he will stop by the hospital later.  Plan to have a palliative follow-up tomorrow.  Conference with attending, bedside nursing staff, transition of care team related to patient condition, needs, goals of care, disposition.  Plan:   At this point continue to treat the treatable but no CPR, no intubation.   Continue goals of care discussion  25 minutes Lillia Carmel, NP Palliative Medicine Team Team Phone # [phone] Greater than 50% of this time was spent counseling and coordinating care related to the above assessment and plan.

## 2019-06-10 NOTE — Progress Notes (Signed)
Notified MD about SBP in 80s and MAPs mid to low 50s. Orders obtained to do nothing aggressive at this time.

## 2019-06-10 NOTE — ED Notes (Signed)
Date and time results received: 06/10/19 0411 (use smartphrase ".now" to insert current time)  Test: Potassium Critical Value: 6.7  Name of Provider Notified: [place]  Orders Received? Or Actions Taken?:

## 2019-06-10 NOTE — Progress Notes (Signed)
  1)Social/Ethics-extensive conversation with the patient's son about goals of care, patient remained hemodynamically unstable with low BP, patient remains encephalopathic -Patient son states that patient would not want aggressive interventions -Specifically no hemodialysis, no pressors, -Patient son requested that we do not escalate care at this time, patient son requests status quo for now.  He wants to see how his mom does overnight prior to making further decisions --ABG with mostly "compensated" Hypercarpnia  -Patient remains a DNR/DNI

## 2019-06-10 NOTE — Consult Note (Addendum)
[facility]  HISTORY AND PHYSICAL  [name] is an 86 y.o. female.    Chief Complaint: AMS  HPI: Pt is an 13F with a PMH sig for HTN, HLD, chronic diastolic CHF, pericardial effusion who is now seen in consultation at the request of Dr. Mariea Clonts for evaluation and recommendations surrounding AKI and hyperkalemia.  Pt was recently admitted to [facility] for acute on chronic CHF exac and AMS>  Was diuresed aggressively (14L).  Noted to have a large pericardial effusion on TTE 05/23/19.  Was discharged on lisinopril 40 mg, Lasix, and K supps on 3/1.  Palliative care had followed during that admission and recommended OP followup.    She returned yesterday evening from SNF for AMS.  Noted decreased level of consciousness that day.  Was found to have significant AKI with Cr 1.2 (3/1)--> 6/12 with BUN 112.  K was 7.2.  Was given Lokelma, Calcium, insulin/ dextrose, and IVFs.  Reportedly HCPOA didn't want dialysis.  This AM Cr is 5.96 with BUN 105 and K down to 6.7.  She did have about 400 mL urinary retention in ED so Foley was placed.  Has had about 300 mL so far out.    Pt will wake up a little bit and mumble "good morning" but otherwise can't get a history.  She doesn't appear to be in any distress.  Discussed with nursing staff--> efforts at medical management of hyperkalemia ongoing.    PMH: Past Medical History:  Diagnosis Date  . Achalasia of cardia   . Acute respiratory failure (HCC)   . AF (atrial fibrillation) (HCC)   . Arthritis   . CHF (congestive heart failure) (HCC)   . Chronic venous hypertension   . Diaphragmatic hernia without mention of obstruction or gangrene   . Esophagitis   . First degree atrioventricular block   . Gastric polyp   . GERD (gastroesophageal reflux disease)   . Heme positive stool   . Hyperlipidemia   . Hyperlipidemia   . Hypertension   . Hypothyroid   . Pericardial effusion   . Prolapse of vaginal walls without mention of uterine prolapse   .  Rectal polyp   . Vitamin D deficiency    PSH: Past Surgical History:  Procedure Laterality Date  . DILATION AND CURETTAGE OF UTERUS    . KIDNEY STONE SURGERY     Past Medical History:  Diagnosis Date  . Achalasia of cardia   . Acute respiratory failure (HCC)   . AF (atrial fibrillation) (HCC)   . Arthritis   . CHF (congestive heart failure) (HCC)   . Chronic venous hypertension   . Diaphragmatic hernia without mention of obstruction or gangrene   . Esophagitis   . First degree atrioventricular block   . Gastric polyp   . GERD (gastroesophageal reflux disease)   . Heme positive stool   . Hyperlipidemia   . Hyperlipidemia   . Hypertension   . Hypothyroid   . Pericardial effusion   . Prolapse of vaginal walls without mention of uterine prolapse   . Rectal polyp   . Vitamin D deficiency     Medications:   Prior to Admission:  Facility-Administered Medications Prior to Admission  Medication Dose Route Frequency Provider Last Rate Last Admin  . cyanocobalamin ((VITAMIN B-12)) injection 1,000 mcg  1,000 mcg Intramuscular Q30 days Ernestina Penna, MD   1,000 mcg at 04/20/19 1036   Medications Prior to Admission  Medication Sig Dispense Refill Last Dose  .  Amino Acids-Protein Hydrolys (FEEDING SUPPLEMENT, PRO-STAT SUGAR FREE 64,) LIQD Take 30 mLs by mouth 2 (two) times daily. 887 mL 0 06/09/2019 at Unknown time  . calcium-vitamin D (OSCAL WITH D) 500-200 MG-UNIT per tablet Take 1 tablet by mouth daily.   06/09/2019 at Unknown time  . Cholecalciferol (VITAMIN D3) 2000 UNITS TABS Take 1 tablet by mouth daily.     06/09/2019 at Unknown time  . ELIQUIS 5 MG TABS tablet TAKE  (1)  TABLET TWICE A DAY. (Patient taking differently: Take 5 mg by mouth 2 (two) times daily. ) 180 tablet 0 06/09/2019 at 900  . furosemide (LASIX) 40 MG tablet TAKE 1 TABLET DAILY. (Patient taking differently: Take 40 mg by mouth daily. ) 90 tablet 0 06/09/2019 at Unknown time  . gabapentin (NEURONTIN) 100 MG capsule  Needs to be seen for further refills. One by mouth twice daily. (Patient taking differently: Take 100 mg by mouth daily. ) 60 capsule 0 06/09/2019 at Unknown time  . iron polysaccharides (FERREX 150) 150 MG capsule Take 1 capsule (150 mg total) by mouth daily. 30 capsule 5 06/09/2019 at Unknown time  . KLOR-CON 10 10 MEQ tablet TAKE 1 TABLET DAILY (Patient taking differently: Take 10 mEq by mouth at bedtime. ) 90 tablet 0 06/09/2019 at Unknown time  . levothyroxine (SYNTHROID) 137 MCG tablet Take 1 tablet (137 mcg total) by mouth daily. as directed 90 tablet 1 06/09/2019 at Unknown time  . lisinopril (ZESTRIL) 40 MG tablet TAKE 1 TABLET DAILY (Patient taking differently: Take 40 mg by mouth daily. ) 90 tablet 0 06/09/2019 at Unknown time  . Multiple Vitamins-Minerals (CENTRUM SILVER) tablet Take 1 tablet by mouth daily.    06/09/2019 at Unknown time  . omeprazole (PRILOSEC) 20 MG capsule TAKE 1 CAPSULE BY MOUTH TWICE DAILY BEFORE A MEAL (Needs to be seen before next refill) (Patient taking differently: Take 20 mg by mouth daily. ) 60 capsule 0 06/09/2019 at Unknown time  . polyethylene glycol powder (GLYCOLAX/MIRALAX) powder DISSOLVE 17 GRAMS (ONE CAPFUL) OF POWDER IN WATER & DRINK ONCE DAILY AS NEEDED (Patient taking differently: Take 17 g by mouth daily as needed for mild constipation or moderate constipation. ) 850 g 1 06/07/2019 at Unknown time  . albuterol (PROVENTIL HFA;VENTOLIN HFA) 108 (90 Base) MCG/ACT inhaler Inhale 2 puffs into the lungs every 6 (six) hours as needed for wheezing or shortness of breath. 8 g 1 unknown  . feeding supplement, ENSURE ENLIVE, (ENSURE ENLIVE) LIQD Take 237 mLs by mouth 2 (two) times daily between meals. 237 mL 12 unknown    Facility-Administered Medications Prior to Admission  Medication Dose Route Frequency Provider Last Rate Last Admin  . cyanocobalamin ((VITAMIN B-12)) injection 1,000 mcg  1,000 mcg Intramuscular Q30 days Ernestina Penna, MD   1,000 mcg at 04/20/19 1036    Medications Prior to Admission  Medication Sig Dispense Refill  . Amino Acids-Protein Hydrolys (FEEDING SUPPLEMENT, PRO-STAT SUGAR FREE 64,) LIQD Take 30 mLs by mouth 2 (two) times daily. 887 mL 0  . calcium-vitamin D (OSCAL WITH D) 500-200 MG-UNIT per tablet Take 1 tablet by mouth daily.    . Cholecalciferol (VITAMIN D3) 2000 UNITS TABS Take 1 tablet by mouth daily.      Marland Kitchen ELIQUIS 5 MG TABS tablet TAKE  (1)  TABLET TWICE A DAY. (Patient taking differently: Take 5 mg by mouth 2 (two) times daily. ) 180 tablet 0  . furosemide (LASIX) 40 MG tablet TAKE 1 TABLET DAILY. (  Patient taking differently: Take 40 mg by mouth daily. ) 90 tablet 0  . gabapentin (NEURONTIN) 100 MG capsule Needs to be seen for further refills. One by mouth twice daily. (Patient taking differently: Take 100 mg by mouth daily. ) 60 capsule 0  . iron polysaccharides (FERREX 150) 150 MG capsule Take 1 capsule (150 mg total) by mouth daily. 30 capsule 5  . KLOR-CON 10 10 MEQ tablet TAKE 1 TABLET DAILY (Patient taking differently: Take 10 mEq by mouth at bedtime. ) 90 tablet 0  . levothyroxine (SYNTHROID) 137 MCG tablet Take 1 tablet (137 mcg total) by mouth daily. as directed 90 tablet 1  . lisinopril (ZESTRIL) 40 MG tablet TAKE 1 TABLET DAILY (Patient taking differently: Take 40 mg by mouth daily. ) 90 tablet 0  . Multiple Vitamins-Minerals (CENTRUM SILVER) tablet Take 1 tablet by mouth daily.     Marland Kitchen omeprazole (PRILOSEC) 20 MG capsule TAKE 1 CAPSULE BY MOUTH TWICE DAILY BEFORE A MEAL (Needs to be seen before next refill) (Patient taking differently: Take 20 mg by mouth daily. ) 60 capsule 0  . polyethylene glycol powder (GLYCOLAX/MIRALAX) powder DISSOLVE 17 GRAMS (ONE CAPFUL) OF POWDER IN WATER & DRINK ONCE DAILY AS NEEDED (Patient taking differently: Take 17 g by mouth daily as needed for mild constipation or moderate constipation. ) 850 g 1  . albuterol (PROVENTIL HFA;VENTOLIN HFA) 108 (90 Base) MCG/ACT inhaler Inhale 2 puffs into  the lungs every 6 (six) hours as needed for wheezing or shortness of breath. 8 g 1  . feeding supplement, ENSURE ENLIVE, (ENSURE ENLIVE) LIQD Take 237 mLs by mouth 2 (two) times daily between meals. 237 mL 12    ALLERGIES:   Allergies  Allergen Reactions  . Aspirin Nausea Only  . Codeine Nausea Only  . Sulfa Antibiotics   . Vicodin [Hydrocodone-Acetaminophen] Nausea Only  . Warfarin Sodium Other (See Comments)    Tingling all over  . Cephalexin Rash    Red, rash covering lower limbs.  . Doxycycline Rash    FAM HX: Family History  Problem Relation Age of Onset  . Hypertension Mother   . Aneurysm Mother        Brain  . Hypertension Father   . Kidney disease Father   . Aneurysm Father        heart  . Arthritis Daughter   . Cancer Daughter        breast  . Heart attack Daughter   . Hyperlipidemia Sister     Social History:   reports that she has never smoked. She has never used smokeless tobacco. She reports that she does not drink alcohol or use drugs.  ROS: ROS: unobtainable d/t AMS  Blood pressure (!) 102/53, pulse 64, temperature (!) 97.5 F (36.4 C), temperature source Oral, resp. rate 15, height 5\' 2"  (1.575 m), weight 81.1 kg, SpO2 97 %. PHYSICAL EXAM: Physical Exam GEN ill-appearing, lying in bed HEENT eyes closed NECK JVD up to angle of mandible on both sides PULM clear anteriorly CV RRR dim heart sounds ABD soft, + suprapubic tenderness EXT trace LE edema, warm NEURO confused, responsive, protecting airway SKIN warm and dry   Results for orders placed or performed during the hospital encounter of 06/09/19 (from the past 48 hour(s))  Comprehensive metabolic panel     Status: Abnormal   Collection Time: 06/09/19  5:40 PM  Result Value Ref Range   Sodium 128 (L) 135 - 145 mmol/L   Potassium 7.2 (HH)  3.5 - 5.1 mmol/L    Comment: CRITICAL RESULT CALLED TO, READ BACK BY AND VERIFIED WITH: CAGLE,J ON 06/09/19 AT 1850 BY LOY,C    Chloride 84 (L) 98 - 111  mmol/L   CO2 34 (H) 22 - 32 mmol/L   Glucose, Bld 101 (H) 70 - 99 mg/dL    Comment: Glucose reference range applies only to samples taken after fasting for at least 8 hours.   BUN 112 (H) 8 - 23 mg/dL    Comment: RESULTS CONFIRMED BY MANUAL DILUTION   Creatinine, Ser 6.12 (H) 0.44 - 1.00 mg/dL   Calcium 8.8 (L) 8.9 - 10.3 mg/dL   Total Protein 6.7 6.5 - 8.1 g/dL   Albumin 3.3 (L) 3.5 - 5.0 g/dL   AST 32 15 - 41 U/L   ALT 21 0 - 44 U/L   Alkaline Phosphatase 135 (H) 38 - 126 U/L   Total Bilirubin 1.2 0.3 - 1.2 mg/dL   GFR calc non Af Amer 6 (L) >60 mL/min   GFR calc Af Amer 7 (L) >60 mL/min   Anion gap 10 5 - 15    Comment: Performed at [facility], [address] [place], [place] [address]  CBC with Differential     Status: Abnormal   Collection Time: 06/09/19  5:40 PM  Result Value Ref Range   WBC 5.3 4.0 - 10.5 K/uL   RBC 3.70 (L) 3.87 - 5.11 MIL/uL   Hemoglobin 11.5 (L) 12.0 - 15.0 g/dL   HCT 36.1 36.0 - 46.0 %   MCV 97.6 80.0 - 100.0 fL   MCH 31.1 26.0 - 34.0 pg   MCHC 31.9 30.0 - 36.0 g/dL   RDW 15.6 (H) 11.5 - 15.5 %   Platelets 169 150 - 400 K/uL   nRBC 0.0 0.0 - 0.2 %   Neutrophils Relative % 74 %   Neutro Abs 3.9 1.7 - 7.7 K/uL   Lymphocytes Relative 12 %   Lymphs Abs 0.7 0.7 - 4.0 K/uL   Monocytes Relative 13 %   Monocytes Absolute 0.7 0.1 - 1.0 K/uL   Eosinophils Relative 1 %   Eosinophils Absolute 0.1 0.0 - 0.5 K/uL   Basophils Relative 0 %   Basophils Absolute 0.0 0.0 - 0.1 K/uL   Immature Granulocytes 0 %   Abs Immature Granulocytes 0.01 0.00 - 0.07 K/uL    Comment: Performed at [facility], [address] [place], [place] [address]  Magnesium     Status: Abnormal   Collection Time: 06/09/19  5:40 PM  Result Value Ref Range   Magnesium 2.9 (H) 1.7 - 2.4 mg/dL    Comment: Performed at [facility], [address], [place], [place] [address]  Basic metabolic panel     Status: Abnormal   Collection Time: 06/09/19 11:18 PM  Result Value Ref Range    Sodium 132 (L) 135 - 145 mmol/L   Potassium 6.5 (HH) 3.5 - 5.1 mmol/L    Comment: CRITICAL RESULT CALLED TO, READ BACK BY AND VERIFIED WITH: T EASTER,RN@0008  06/10/19 MKELLY    Chloride 88 (L) 98 - 111 mmol/L   CO2 35 (H) 22 - 32 mmol/L   Glucose, Bld 55 (L) 70 - 99 mg/dL    Comment: Glucose reference range applies only to samples taken after fasting for at least 8 hours.   BUN 111 (H) 8 - 23 mg/dL    Comment: RESULTS CONFIRMED BY MANUAL DILUTION   Creatinine, Ser 6.19 (H) 0.44 - 1.00  mg/dL   Calcium 8.6 (L) 8.9 - 10.3 mg/dL   GFR calc non Af Amer 6 (L) >60 mL/min   GFR calc Af Amer 7 (L) >60 mL/min   Anion gap 9 5 - 15    Comment: Performed at [facility], [address], [place], [place] [address]  Basic metabolic panel     Status: Abnormal   Collection Time: 06/10/19  3:34 AM  Result Value Ref Range   Sodium 132 (L) 135 - 145 mmol/L   Potassium 6.7 (HH) 3.5 - 5.1 mmol/L    Comment: CRITICAL RESULT CALLED TO, READ BACK BY AND VERIFIED WITH: T EASTER,RN @0411  06/10/19 MKELLY    Chloride 89 (L) 98 - 111 mmol/L   CO2 35 (H) 22 - 32 mmol/L   Glucose, Bld 86 70 - 99 mg/dL    Comment: Glucose reference range applies only to samples taken after fasting for at least 8 hours.   BUN 105 (H) 8 - 23 mg/dL   Creatinine, Ser 08/10/19 (H) 0.44 - 1.00 mg/dL   Calcium 8.5 (L) 8.9 - 10.3 mg/dL   GFR calc non Af Amer 6 (L) >60 mL/min   GFR calc Af Amer 7 (L) >60 mL/min   Anion gap 8 5 - 15    Comment: Performed at [facility], [address], [place], [place]  CBC     Status: Abnormal   Collection Time: 06/10/19  3:34 AM  Result Value Ref Range   WBC 4.8 4.0 - 10.5 K/uL   RBC 3.34 (L) 3.87 - 5.11 MIL/uL   Hemoglobin 10.4 (L) 12.0 - 15.0 g/dL   HCT 08/10/19 (L) 27.2 - 53.6 %   MCV 99.7 80.0 - 100.0 fL   MCH 31.1 26.0 - 34.0 pg   MCHC 31.2 30.0 - 36.0 g/dL   RDW 64.4 03.4 - 74.2 %   Platelets 156 150 - 400 K/uL   nRBC 0.0 0.0 - 0.2 %    Comment: Performed at [facility], [address] [place], [place]  MRSA PCR Screening     Status: None   Collection Time: 06/10/19  5:03 AM   Specimen: Nasal Mucosa; Nasopharyngeal  Result Value Ref Range   MRSA by PCR NEGATIVE NEGATIVE    Comment:        The GeneXpert MRSA Assay (FDA approved for NASAL specimens only), is one component of a comprehensive MRSA colonization surveillance program. It is not intended to diagnose MRSA infection nor to guide or monitor treatment for MRSA infections. Performed at [facility], [address] [place], [place]   Urinalysis, Routine w reflex microscopic     Status: Abnormal   Collection Time: 06/10/19  5:17 AM  Result Value Ref Range   Color, Urine YELLOW YELLOW   APPearance TURBID (A) CLEAR   Specific Gravity, Urine 1.012 1.005 - 1.030   pH 5.0 5.0 - 8.0   Glucose, UA NEGATIVE NEGATIVE mg/dL   Hgb urine dipstick MODERATE (A) NEGATIVE   Bilirubin Urine NEGATIVE NEGATIVE   Ketones, ur 5 (A) NEGATIVE mg/dL   Protein, ur 08/10/19 (A) NEGATIVE mg/dL   Nitrite NEGATIVE NEGATIVE   Leukocytes,Ua MODERATE (A) NEGATIVE   RBC / HPF 21-50 0 - 5 RBC/hpf   WBC, UA >50 (H) 0 - 5 WBC/hpf   Bacteria, UA NONE SEEN NONE SEEN   Squamous Epithelial / LPF 6-10 0 - 5   WBC Clumps PRESENT    Hyaline Casts, UA PRESENT  Uric Acid Crys, UA PRESENT    Non Squamous Epithelial 6-10 (A) NONE SEEN    Comment: Performed at [facility], [address] [place], [place] [id number]    CT HEAD WO CONTRAST  Result Date: 06/09/2019 CLINICAL DATA:  86 year old female with altered mental status common cephalopathy. EXAM: CT HEAD WITHOUT CONTRAST TECHNIQUE: Contiguous axial images were obtained from the base of the skull through the vertex without intravenous contrast. COMPARISON:  Brain MRI 02/25/2018. Head CT 05/24/2019. FINDINGS: Brain: Perivascular space at the inferior left basal ganglia again noted (normal variant). Cerebral volume is within normal limits for age. No midline shift,  ventriculomegaly, mass effect, evidence of mass lesion, intracranial hemorrhage or evidence of cortically based acute infarction. Gray-white matter differentiation appears stable and within normal limits for age. Vascular: Calcified atherosclerosis at the skull base. No suspicious intracranial vascular hyperdensity. Skull: No acute osseous abnormality identified. Sinuses/Orbits: Visualized paranasal sinuses and mastoids are stable and well pneumatized. Other: No acute orbit or scalp soft tissue finding. IMPRESSION: Non contrast CT appearance of the brain remains stable since last month and within normal limits for age. Electronically Signed   By: Odessa FlemingH  Hall M.D.   On: 06/09/2019 22:41    Assessment/Plan  1.  AKI on CKD 3: sig AKI from 1.2 (3/1) to 6.12 in a matter of days.  Some hypotension in ED and in ICU.  Looks like hemodynamic issues primarily but will also check US given history of retention in ED.  Strict I and O. Would leave Foley in for now.  I'm going to call son to clarify GOC--> she looks like a poor candidate for HD.  I've also re-consulted palliative.  Serial RFP checks.  Addend: discussed with son, Kela Millinnthony Tamer.  Unsure about dialysis- no decisions were made during our conversation today.  2.  Hyperkalemia: up to 7.2--> needs aggressive management, have reordered Lokelma, Ca gluc, and albuterol.  No bicarb as serum chemistries with metabolic alkalosis (? Compensatory from hypercarbia, see ABG from previous hospitalization)  3.  Acute metabolic encephalopathy: CT head negative, appears to be multifactorial with UTI, gabapentin, azotemia/ uremia.  Consider repeating ABG  4.  UTI: urine looks dirty, multiple allergies, would avoid quinolones with hyperK and AMS, would suggest fosfomycin or aztreonam (? Overkill) while we await culture  5.  Hypertension: hold lisinopril--> she is hypotensive here--> sepsis + pericardial effusion?    6.  Large pericardial effusion: would recommend repeat TTE  +/- cardiology c/s to assess for tamponade--> she has very sig JVD and hypotension, her weights aren't that different from discharge weight (3 kg) so I am assuming JVD would be out of proportion to her discharge exam  7.  Dispo:  ICU  Bufford ButtnerUPTON, [name] 06/10/2019, 9:17 AM

## 2019-06-10 NOTE — ED Notes (Signed)
This RN informed hospitalist Sharl Ma) of blood pressure trending downwards. Currently 83/48. Was advised to change IV fluid rate from 75/hr to 125/hr. Will continue to monitor.

## 2019-06-10 NOTE — Progress Notes (Signed)
CRITICAL VALUE ALERT  Critical Value:  PCO2 70.1  Date & Time Notied:  06/10/19 @1257   Provider Notified: Dr.  Orders Received/Actions taken: no new orders at this time

## 2019-06-10 NOTE — Progress Notes (Signed)
Patient Demographics:    [name], is a 86 y.o. female, DOB - 08-03-1932, IOE:[id number]  Admit date - 06/09/2019   Admitting Physician Ejiroghene Arlyce Dice, MD  Outpatient Primary MD for the patient is Janora Norlander, DO  LOS - 1   Chief Complaint  Patient presents with  . Altered Mental Status  . hyperkalemia        Subjective:    [name] today has no fevers, no emesis,  No chest pain, resting comfortably -Son visited =-Becoming less lethargic as the day went along  Assessment  & Plan :    Principal Problem:   Acute metabolic encephalopathy Active Problems:   Goals of care, counseling/discussion   Palliative care encounter   AF (atrial fibrillation) (HCC)   Hyperkalemia   Chronic diastolic CHF (congestive heart failure) (HCC)   Acute kidney injury superimposed on CKD [facility])  Brief Summary:- 86 y.o. female with medical history significant for atrial fibrillation, hypertension, congestive heart failure.  Admitted on 06/09/2019 with acute metabolic encephalopathy in the setting of renal failure with presumed uremia/azotemia   A/p 1)Social/Ethics-extensive conversation with the patient's son about goals of care, patient remained hemodynamically unstable with low BP, patient remains encephalopathic -Patient son states that patient would not want aggressive interventions -Specifically no hemodialysis, no pressors, -Patient son requested that we do not escalate care at this time, patient son requests status quo for now.  He wants to see how his mom does overnight prior to making further decisions -Patient remains a DNR/DNI --ABG with mostly "compensated" Hypercarpnia  2)Aki on CKD IIIb with Persistent Hyperkalemia--- potassium down to 6.1 from a peak of 7.2 after hyperkalemia cocktail and Lokelma x3 doses -Baseline creatinine 1.2 just over a week PTA -Discussed with nephrology  service patient appears somewhat volume overloaded, advised no further IV fluids -Renal ultrasound without obstructive uropathy -Keep Foley in for accurate ins and O -Patient son declines HD  3) acute metabolic encephalopathy--- suspect uremia/azotemia related, gram-negative rod UTI is probably contributory as well ABG with mostly compensated hypercapnia, CT head without acute findings  4)GNR UTI--- continue aztreonam pending culture results, patient with allergies to sulfa, cephalosporins and doxycycline  HTN--- c/n Hold Lisinopril due to hypotension and hyperkalemia  4)Large Pericardial Effusion--this is probably contributing to patient's hypotension  5)Permanent Atrial Fibrillation--- Eliquis on hold, patient is actually bradycardic without any AV nodal blocking agents  6)HFpEF/chronic diastolic dysfunction CHF----patient is on hold due to low blood pressure and kidney concerns.  -Clinically somewhat volume overloaded but BP too low to allow for diuresis   Disposition/Need for in-Hospital Stay- patient unable to be discharged at this time due to --- worsening renal function, persistent hyperkalemia, persistent hypotension-requiring interventions especially for hyperkalemia -Not medically ready for discharge, remain in ICU for now  Code Status : Full code  Family Communication:  Discussed with son  Consults  : Cardiology and nephrology  DVT Prophylaxis  :    - Heparin - SCDs   Lab Results  Component Value Date   PLT 156 06/10/2019    Inpatient Medications  Scheduled Meds: . Chlorhexidine Gluconate Cloth  6 each Topical Daily  . heparin  5,000 Units Subcutaneous Q8H   Continuous Infusions: . aztreonam Stopped (06/10/19 1500)  PRN Meds:.    Anti-infectives (From admission, onward)   Start     Dose/Rate Route Frequency Ordered Stop   06/10/19 1400  aztreonam (AZACTAM) 1 g in sodium chloride 0.9 % 100 mL IVPB  Status:  Discontinued     1 g 200 mL/hr over 30 Minutes  Intravenous Every 12 hours 06/10/19 1317 06/10/19 1319   06/10/19 1400  aztreonam (AZACTAM) 0.5 g in dextrose 5 % 50 mL IVPB     0.5 g 100 mL/hr over 30 Minutes Intravenous Every 8 hours 06/10/19 1320          Objective:   Vitals:   06/10/19 1500 06/10/19 1600 06/10/19 1700 06/10/19 1800  BP: (!) 82/41 (!) 80/47 (!) 87/52 (!) 92/46  Pulse: (!) 58 60 (!) 56 (!) 59  Resp: 15 15 14 16   Temp:   (!) 97.5 F (36.4 C)   TempSrc:   Oral   SpO2: 96% 97% 95% 93%  Weight:      Height:        Wt Readings from Last 3 Encounters:  06/10/19 81.1 kg  06/01/19 74.7 kg  05/19/19 95.6 kg     Intake/Output Summary (Last 24 hours) at 06/10/2019 1931 Last data filed at 06/10/2019 1600 Gross per 24 hour  Intake 1123.33 ml  Output 825 ml  Net 298.33 ml     Physical Exam  Gen:-Lethargic HEENT:- [place].AT, No sclera icterus Neck-Supple Neck, significant JVD elevation noted.  Lungs-diminished in bases without wheezing  CV- S1, S2 normal, irregular Abd-  +ve B.Sounds, Abd Soft, No tenderness,    Extremity/Skin:-  Trace edema, pedal pulses present , significant lower extremity varicosities Psych-lethargic, somewhat disoriented Neuro-generalized weakness, no new focal deficits,    Data Review:   Micro Results Recent Results (from the past 240 hour(s))  Respiratory Panel by RT PCR (Flu A&B, Covid) - Nasopharyngeal Swab     Status: None   Collection Time: 06/02/19  2:11 PM   Specimen: Nasopharyngeal Swab  Result Value Ref Range Status   SARS Coronavirus 2 by RT PCR NEGATIVE NEGATIVE Final    Comment: (NOTE) SARS-CoV-2 target nucleic acids are NOT DETECTED. The SARS-CoV-2 RNA is generally detectable in upper respiratoy specimens during the acute phase of infection. The lowest concentration of SARS-CoV-2 viral copies this assay can detect is 131 copies/mL. A negative result does not preclude SARS-Cov-2 infection and should not be used as the sole basis for treatment or other patient  management decisions. A negative result may occur with  improper specimen collection/handling, submission of specimen other than nasopharyngeal swab, presence of viral mutation(s) within the areas targeted by this assay, and inadequate number of viral copies (<131 copies/mL). A negative result must be combined with clinical observations, patient history, and epidemiological information. The expected result is Negative. Fact Sheet for Patients:  https://www.moore.com/https://www.fda.gov/media/142436/download Fact Sheet for Healthcare Providers:  https://www.young.biz/https://www.fda.gov/media/142435/download This test is not yet ap proved or cleared by the [place] FDA and  has been authorized for detection and/or diagnosis of SARS-CoV-2 by FDA under an Emergency Use Authorization (EUA). This EUA will remain  in effect (meaning this test can be used) for the duration of the COVID-19 declaration under Section 564(b)(1) of the Act, 21 U.S.C. section 360bbb-3(b)(1), unless the authorization is terminated or revoked sooner.    Influenza A by PCR NEGATIVE NEGATIVE Final   Influenza B by PCR NEGATIVE NEGATIVE Final    Comment: (NOTE) The Xpert Xpress SARS-CoV-2/FLU/RSV assay is intended as an aid in  the diagnosis of influenza from Nasopharyngeal swab specimens and  should not be used as a sole basis for treatment. Nasal washings and  aspirates are unacceptable for Xpert Xpress SARS-CoV-2/FLU/RSV  testing. Fact Sheet for Patients: https://www.moore.com/ Fact Sheet for Healthcare Providers: https://www.young.biz/ This test is not yet approved or cleared by the [place] FDA and  has been authorized for detection and/or diagnosis of SARS-CoV-2 by  FDA under an Emergency Use Authorization (EUA). This EUA will remain  in effect (meaning this test can be used) for the duration of the  Covid-19 declaration under Section 564(b)(1) of the Act, 21  U.S.C. section 360bbb-3(b)(1), unless the  authorization is  terminated or revoked. Performed at [facility], [address] [place], [place] [address]   MRSA PCR Screening     Status: None   Collection Time: 06/10/19  5:03 AM   Specimen: Nasal Mucosa; Nasopharyngeal  Result Value Ref Range Status   MRSA by PCR NEGATIVE NEGATIVE Final    Comment:        The GeneXpert MRSA Assay (FDA approved for NASAL specimens only), is one component of a comprehensive MRSA colonization surveillance program. It is not intended to diagnose MRSA infection nor to guide or monitor treatment for MRSA infections. Performed at [facility], [address] [place], [place] [address]     Radiology Reports CT HEAD WO CONTRAST  Result Date: 06/09/2019 CLINICAL DATA:  86 year old female with altered mental status common cephalopathy. EXAM: CT HEAD WITHOUT CONTRAST TECHNIQUE: Contiguous axial images were obtained from the base of the skull through the vertex without intravenous contrast. COMPARISON:  Brain MRI 02/25/2018. Head CT 05/24/2019. FINDINGS: Brain: Perivascular space at the inferior left basal ganglia again noted (normal variant). Cerebral volume is within normal limits for age. No midline shift, ventriculomegaly, mass effect, evidence of mass lesion, intracranial hemorrhage or evidence of cortically based acute infarction. Gray-white matter differentiation appears stable and within normal limits for age. Vascular: Calcified atherosclerosis at the skull base. No suspicious intracranial vascular hyperdensity. Skull: No acute osseous abnormality identified. Sinuses/Orbits: Visualized paranasal sinuses and mastoids are stable and well pneumatized. Other: No acute orbit or scalp soft tissue finding. IMPRESSION: Non contrast CT appearance of the brain remains stable since last month and within normal limits for age. Electronically Signed   By: Odessa Fleming M.D.   On: 06/09/2019 22:41   CT HEAD WO CONTRAST  Result Date: 05/24/2019 CLINICAL DATA:  85 year old  female with altered mental status. Admitted with respiratory failure. EXAM: CT HEAD WITHOUT CONTRAST TECHNIQUE: Contiguous axial images were obtained from the base of the skull through the vertex without intravenous contrast. COMPARISON:  Brain MRI 02/25/2018. FINDINGS: Brain: Cerebral volume is stable and within normal limits for age. There is a chronic perivascular space at the inferior left lentiform (normal variant). No midline shift, ventriculomegaly, mass effect, evidence of mass lesion, intracranial hemorrhage or evidence of cortically based acute infarction. Normal for age gray-white matter differentiation. Vascular: Calcified atherosclerosis at the skull base. Skull: No acute osseous abnormality identified. Sinuses/Orbits: Visualized paranasal sinuses and mastoids are stable and well pneumatized. Other: No acute orbit or scalp soft tissue finding. Scalp vessels are prominent diffusely, although unchanged from the prior MRI. IMPRESSION: Normal for age non contrast CT appearance of the brain. Electronically Signed   By: Odessa Fleming M.D.   On: 05/24/2019 18:19   US RENAL  Result Date: 06/10/2019 CLINICAL DATA:  Acute renal injury EXAM: RENAL / URINARY TRACT ULTRASOUND COMPLETE COMPARISON:  04/10/2017 FINDINGS: Right Kidney: Renal measurements: 12.5 x 4.4 x 5.0 cm. = volume: 145 mL. No mass lesion or hydronephrosis is noted. Mild increased echogenicity is seen. Left Kidney: Renal measurements: 12.2 x 5.7 x 6.0 cm = volume: 217 mL. Mild increased echogenicity is noted. Scattered cysts are noted throughout the left kidney. The largest of these measures 2.3 cm and is simple in nature. The cysts are all clustered in the midportion of the kidney. No obstructive changes are seen. Bladder: Decompressed Other: None. IMPRESSION: Increased echogenicity consistent with medical renal disease. Left renal cysts. Electronically Signed   By: Alcide Clever M.D.   On: 06/10/2019 15:20   DG CHEST PORT 1 VIEW  Result Date:  05/27/2019 CLINICAL DATA:  Bilateral pleural effusion and pericardial effusion, negative COVID-19 testing 8 days prior EXAM: PORTABLE CHEST 1 VIEW COMPARISON:  Radiograph 05/24/2019 FINDINGS: Diminishing lung volumes with persistent bilateral interstitial and airspace opacities, slightly increasing in the right upper and lower lung though this may be attributable to the lower volumes. Stable cardiomegaly. Bilateral effusions. No pneumothorax. No acute osseous or soft tissue abnormality. Telemetry leads overlie the chest. IMPRESSION: Diminishing lung volumes with persistent bilateral interstitial and airspace opacities, slightly increasing in the right upper and lower lung though this may be attributable to lower volumes. Bilateral effusions. Electronically Signed   By: Kreg Shropshire M.D.   On: 05/27/2019 05:58   DG CHEST PORT 1 VIEW  Result Date: 05/24/2019 CLINICAL DATA:  Acute on chronic respiratory failure. EXAM: PORTABLE CHEST 1 VIEW COMPARISON:  05/19/2019 FINDINGS: Patient is rotated to the right. There is mild motion artifact. Lungs are adequately inflated with interval worsening bibasilar opacification likely moderate bilateral effusions with associated bibasilar atelectasis. Infection in the mid to lower lungs is possible. Stable cardiomegaly with mild prominence of the perihilar markings suggesting mild vascular congestion. Remainder the exam is unchanged. IMPRESSION: Mild cardiomegaly with mild vascular congestion. Worsening bilateral moderate pleural effusions likely with associated bibasilar atelectasis. Infection in the lung bases is possible. Electronically Signed   By: Elberta Fortis M.D.   On: 05/24/2019 10:40   DG Chest Port 1 View  Result Date: 05/19/2019 CLINICAL DATA:  Decreased oxygen saturation today. Lower extremity swelling. EXAM: PORTABLE CHEST 1 VIEW COMPARISON:  PA and lateral chest 05/28/2017. FINDINGS: There is massive cardiomegaly. Small to moderate bilateral pleural effusions  are seen, larger on the right. Pulmonary edema and basilar atelectasis are seen. No pneumothorax. No acute bony abnormality. Avascular necrosis of the humeral heads is noted. IMPRESSION: Cardiomegaly with interstitial pulmonary edema and small to moderate pleural effusions, larger on the right. Electronically Signed   By: Drusilla Kanner M.D.   On: 05/19/2019 12:25   ECHOCARDIOGRAM COMPLETE  Result Date: 05/20/2019    ECHOCARDIOGRAM REPORT   Patient Name:   ELWYN LOWDEN Date of Exam: 05/20/2019 Medical Rec #:  [id number]      Height:       64.0 in Accession #:    [id number]     Weight:       208.8 lb Date of Birth:  [birth year]      BSA:          1.99 m Patient Age:    86 years       BP:           100/52 mmHg Patient Gender: F              HR:  73 bpm. [facility] Procedure: 2D Echo Indications:    CHF-Acute Diastolic 428.31 / I50.31  History:        Patient has prior history of Echocardiogram examinations, most                 recent 03/16/2015. Arrythmias:Atrial Fibrillation; Risk                 Factors:Dyslipidemia, Hypertension and Non-Smoker. Acute                 hypoxemic respiratory failure , First degree atrioventricular                 block, GERD, Non-rheumatic mitral regurgitation.  Sonographer:    Jeryl Columbia RDCS (AE) Referring Phys: [phone] Lamont Dowdy [facility] IMPRESSIONS  1. Left ventricular ejection fraction, by estimation, is 60 to 65%. The left ventricle has normal function. The left ventricle has no regional wall motion abnormalities. There is mild concentric left ventricular hypertrophy. Left ventricular diastolic parameters are indeterminate. Elevated left ventricular end-diastolic pressure.  2. Right ventricular systolic function is normal. The right ventricular size is mildly enlarged. There is severely elevated pulmonary artery systolic pressure.  3. Left atrial size was severely dilated.  4. Right atrial size was severely dilated.  5. IVC collapses. No RA or RV  collapse. No signs of tamponade. Large pericardial effusion. The pericardial effusion is circumferential.  6. The mitral valve is grossly normal. Mild mitral valve regurgitation.  7. Tricuspid valve regurgitation is moderate.  8. The aortic valve is tricuspid. Aortic valve regurgitation is mild. Mild aortic valve stenosis. Aortic valve area, by VTI measures 1.57 cm. Aortic valve mean gradient measures 17.0 mmHg. Aortic valve Vmax measures 2.79 m/s.  9. The inferior vena cava is dilated in size with >50% respiratory variability, suggesting right atrial pressure of 8 mmHg. FINDINGS  Left Ventricle: Left ventricular ejection fraction, by estimation, is 60 to 65%. The left ventricle has normal function. The left ventricle has no regional wall motion abnormalities. The left ventricular internal cavity size was normal in size. There is  mild concentric left ventricular hypertrophy. Left ventricular diastolic parameters are indeterminate. Elevated left ventricular end-diastolic pressure. Right Ventricle: The right ventricular size is mildly enlarged. No increase in right ventricular wall thickness. Right ventricular systolic function is normal. There is severely elevated pulmonary artery systolic pressure. The tricuspid regurgitant velocity is 4.02 m/s, and with an assumed right atrial pressure of 10 mmHg, the estimated right ventricular systolic pressure is 74.6 mmHg. Left Atrium: Left atrial size was severely dilated. Right Atrium: Right atrial size was severely dilated. Pericardium: IVC collapses. No RA or RV collapse. No signs of tamponade. A large pericardial effusion is present. The pericardial effusion is circumferential. Mitral Valve: The mitral valve is grossly normal. Mild mitral valve regurgitation. Tricuspid Valve: The tricuspid valve is grossly normal. Tricuspid valve regurgitation is moderate. Aortic Valve: The aortic valve is tricuspid. Aortic valve regurgitation is mild. Aortic regurgitation PHT measures  540 msec. Mild aortic stenosis is present. Moderate aortic valve annular calcification. There is mild calcification of the aortic valve. Aortic valve mean gradient measures 17.0 mmHg. Aortic valve peak gradient measures 31.1 mmHg. Aortic valve area, by VTI measures 1.57 cm. Pulmonic Valve: The pulmonic valve was grossly normal. Pulmonic valve regurgitation is not visualized. Aorta: The aortic root is normal in size and structure. Venous: The inferior vena cava is dilated in size with greater than 50% respiratory variability, suggesting right atrial  pressure of 8 mmHg. IAS/Shunts: No atrial level shunt detected by color flow Doppler.  LEFT VENTRICLE PLAX 2D LVIDd:         5.12 cm  Diastology LVIDs:         3.07 cm  LV e' lateral:   8.81 cm/s LV PW:         1.34 cm  LV E/e' lateral: 15.3 LV IVS:        1.15 cm  LV e' medial:    6.53 cm/s LVOT diam:     1.80 cm  LV E/e' medial:  20.7 LV SV:         91.86 ml LV SV Index:   41.66 LVOT Area:     2.54 cm  LEFT ATRIUM              Index       RIGHT ATRIUM           Index LA diam:        4.90 cm  2.46 cm/m  RA Area:     38.50 cm LA Vol (A2C):   186.0 ml 93.35 ml/m RA Volume:   166.00 ml 83.32 ml/m LA Vol (A4C):   160.0 ml 80.30 ml/m LA Biplane Vol: 173.0 ml 86.83 ml/m  AORTIC VALVE AV Area (Vmax):    1.62 cm AV Area (Vmean):   1.49 cm AV Area (VTI):     1.57 cm AV Vmax:           278.67 cm/s AV Vmean:          192.333 cm/s AV VTI:            0.584 m AV Peak Grad:      31.1 mmHg AV Mean Grad:      17.0 mmHg LVOT Vmax:         177.33 cm/s LVOT Vmean:        112.667 cm/s LVOT VTI:          0.361 m LVOT/AV VTI ratio: 0.62 AI PHT:            540 msec  AORTA Ao Root diam: 2.80 cm MITRAL VALVE                TRICUSPID VALVE MV Area (PHT): 2.42 cm     TR Peak grad:   64.6 mmHg MV Decel Time: 313 msec     TR Vmax:        402.00 cm/s MR Peak grad: 63.0 mmHg MR Mean grad: 45.0 mmHg     SHUNTS MR Vmax:      397.00 cm/s   Systemic VTI:  0.36 m MR Vmean:     320.0 cm/s     Systemic Diam: 1.80 cm MV E velocity: 135.00 cm/s MV A velocity: 39.40 cm/s MV E/A ratio:  3.43 Prentice Docker MD Electronically signed by Prentice Docker MD Signature Date/Time: 05/20/2019/2:55:41 PM    Final    ECHOCARDIOGRAM LIMITED  Result Date: 05/23/2019    ECHOCARDIOGRAM LIMITED REPORT   Patient Name:   JODY AGUINAGA Date of Exam: 05/23/2019 Medical Rec #:  [id number]      Height:       64.0 in Accession #:    [id number]     Weight:       195.1 lb Date of Birth:  [birth year]      BSA:          1.936 m Patient Age:    68 years  BP:           129/70 mmHg Patient Gender: F              HR:           78 bpm. [facility] Procedure: Limited Echo and Cardiac Doppler Indications:    Pericardial Effusion 423.9 / l31.3  History:        Patient has prior history of Echocardiogram examinations, most                 recent 05/20/2019. CHF, Arrythmias:Atrial Fibrillation; Risk                 Factors:Hypertension and Dyslipidemia. Non-Smoker. Acute                 hypoxemic respiratory failure , First degree atrioventricular                 block, GERD, Non-rheumatic mitral regurgitation.  Sonographer:    Celesta Gentile RCS Referring Phys: [phone] [facility] IMPRESSIONS  1. Limited study.  2. Large pericardial effusion, circumferential but to greater degree posterior and lateral. Dimensions are similar to previous study on February 17. No obvious significant respiratory variation in mitral outflow to suggest tamponade physiology. There is  no compression of RA or RV either, but evidence of significant pulmonary hypertension (last study RVSP was approximately 75 mmHg) and this could be inhibiting compression of RV chamber. Organizing material adherent to pericardium suggests possible chronicity, particularly given effusion size.  3. Left ventricular ejection fraction, by estimation, is 60 to 65%. The left ventricle has normal function. The left ventricle has no regional wall motion  abnormalities. There is the interventricular septum is flattened in systole and diastole, consistent with right ventricular pressure and volume overload.  4. The right ventricular size is moderately enlarged.  5. Left atrial size was severely dilated.  6. Right atrial size was severely dilated.  7. The mitral valve is grossly normal. FINDINGS  Left Ventricle: Left ventricular ejection fraction, by estimation, is 60 to 65%. The left ventricle has normal function. The left ventricle has no regional wall motion abnormalities. The interventricular septum is flattened in systole and diastole, consistent with right ventricular pressure and volume overload. Right Ventricle: The right ventricular size is moderately enlarged. Left Atrium: Left atrial size was severely dilated. Right Atrium: Right atrial size was severely dilated. Pericardium: Dimensions are similar to previous study on February 17. No obvious significant respiratory variation in mitral outflow to suggest tamponade physiology. There is no compression of RA or RV either, but evidence of significant pulmonary hypertension (last study RVSP was approximately 75 mmHg) and this could be inhibiting compression of RV chamber. Organizing material adherent to pericardium suggests possible chronicity, particularly given effusion size. A large pericardial effusion is present. The pericardial effusion is circumferential. Mitral Valve: The mitral valve is grossly normal. Mild to moderate mitral annular calcification. Additional Comments: There is pleural effusion in the left lateral region. Nona Dell MD Electronically signed by Nona Dell MD Signature Date/Time: 05/23/2019/2:09:38 PM    Final    DG ESOPHAGUS W SINGLE CM (SOL OR THIN BA)  Result Date: 05/22/2019 CLINICAL DATA:  DYSPHAGIA. EXAM: ESOPHOGRAM/BARIUM SWALLOW TECHNIQUE: Single contrast examination was performed using thin barium or water soluble. FLUOROSCOPY TIME:  Fluoroscopy Time: 2 minutes 18  seconds Radiation Exposure Index (if provided by the fluoroscopic device): 38.6 mGy COMPARISON:  None. FINDINGS: The patient ingested thin barium in a semi  upright position. The patient has poor esophageal peristalsis with persistent spasm in the distal esophagus consistent with achalasia. I placed the patient in an almost upright position and only a small amount of barium trickled into the nondistended stomach. IMPRESSION: Achalasia. Minimal emptying of the dilated esophagus. Minimal peristalsis in the esophagus. The patient tolerated the procedure well. No immediate complications. Electronically Signed   By: Francene Boyers M.D.   On: 05/22/2019 11:18     CBC Recent Labs  Lab 06/09/19 1740 06/10/19 0334  WBC 5.3 4.8  HGB 11.5* 10.4*  HCT 36.1 33.3*  PLT 169 156  MCV 97.6 99.7  MCH 31.1 31.1  MCHC 31.9 31.2  RDW 15.6* 15.3  LYMPHSABS 0.7  --   MONOABS 0.7  --   EOSABS 0.1  --   BASOSABS 0.0  --     Chemistries  Recent Labs  Lab 06/09/19 1740 06/09/19 2318 06/10/19 0334 06/10/19 1310 06/10/19 1831  NA 128* 132* 132* 133* 132*  K 7.2* 6.5* 6.7* 6.1* 6.1*  CL 84* 88* 89* 91* 89*  CO2 34* 35* 35* 33* 33*  GLUCOSE 101* 55* 86 85 90  BUN 112* 111* 105* 113* PENDING  CREATININE 6.12* 6.19* 5.96* 5.86* 5.89*  CALCIUM 8.8* 8.6* 8.5* 8.9 8.8*  MG 2.9*  --   --   --   --   AST 32  --   --   --   --   ALT 21  --   --   --   --   ALKPHOS 135*  --   --   --   --   BILITOT 1.2  --   --   --   --    ------------------------------------------------------------------------------------------------------------------ No results for input(s): CHOL, HDL, LDLCALC, TRIG, CHOLHDL, LDLDIRECT in the last 72 hours.  Lab Results  Component Value Date   HGBA1C 6.2 06/16/2016   ------------------------------------------------------------------------------------------------------------------ No results for input(s): TSH, T4TOTAL, T3FREE, THYROIDAB in the last 72 hours.  Invalid input(s):  FREET3 ------------------------------------------------------------------------------------------------------------------ No results for input(s): VITAMINB12, FOLATE, FERRITIN, TIBC, IRON, RETICCTPCT in the last 72 hours.  Coagulation profile No results for input(s): INR, PROTIME in the last 168 hours.  No results for input(s): DDIMER in the last 72 hours.  Cardiac Enzymes No results for input(s): CKMB, TROPONINI, MYOGLOBIN in the last 168 hours.  Invalid input(s): CK ------------------------------------------------------------------------------------------------------------------    Component Value Date/Time   BNP 359.0 (H) 05/19/2019 1200     Shon Hale M.D on 06/10/2019 at 7:31 PM  Go to [email or web address] - for contact info  [facility] - Office  [phone]

## 2019-06-10 NOTE — Progress Notes (Addendum)
Pharmacy Antibiotic Note  [name] is a 86 y.o. female admitted on 06/09/2019 with UTI.  Pharmacy has been consulted for aztreonam dosing for this patient with acute-on -chronic renal dysfunction--felt to be a poor candidate for hemodialysis. She has multiple antibiotic allergies and reports rash with cephalexin.  Plan: Start aztreonam 500mg  IV q8h  Pharmacy will continue to monitor renal function, cultures and patient progress.   Height: 5\' 2"  (157.5 cm) Weight: 178 lb 12.7 oz (81.1 kg) IBW/kg (Calculated) : 50.1  Temp (24hrs), Avg:97.6 F (36.4 C), Min:97.4 F (36.3 C), Max:97.9 F (36.6 C)  Recent Labs  Lab 06/09/19 1740 06/09/19 2318 06/10/19 0334  WBC 5.3  --  4.8  CREATININE 6.12* 6.19* 5.96*    Estimated Creatinine Clearance: 6.8 mL/min (A) (by C-G formula based on SCr of 5.96 mg/dL (H)).    Allergies  Allergen Reactions  . Aspirin Nausea Only  . Codeine Nausea Only  . Sulfa Antibiotics   . Vicodin [Hydrocodone-Acetaminophen] Nausea Only  . Warfarin Sodium Other (See Comments)    Tingling all over  . Cephalexin Rash    Red, rash covering lower limbs.  . Doxycycline Rash    Antimicrobials this admission: aztreonam 3/10>>     Dose adjustments this admission: aztreonam  Microbiology results: 3/10 UCx:   3/2 Resp PCR: SARS CoV-2 negative; Flu A/B negative 3/10 MRSA PCR:   Thank you for allowing pharmacy to be a part of this patient's care.  08/10/19 06/10/2019 1:17 PM

## 2019-06-11 DIAGNOSIS — Z7189 Other specified counseling: Secondary | ICD-10-CM

## 2019-06-11 DIAGNOSIS — N179 Acute kidney failure, unspecified: Secondary | ICD-10-CM | POA: Diagnosis present

## 2019-06-11 LAB — COMPREHENSIVE METABOLIC PANEL
ALT: 17 U/L (ref 0–44)
AST: 22 U/L (ref 15–41)
Albumin: 2.7 g/dL — ABNORMAL LOW (ref 3.5–5.0)
Alkaline Phosphatase: 111 U/L (ref 38–126)
Anion gap: 11 (ref 5–15)
BUN: 116 mg/dL — ABNORMAL HIGH (ref 8–23)
CO2: 34 mmol/L — ABNORMAL HIGH (ref 22–32)
Calcium: 8.8 mg/dL — ABNORMAL LOW (ref 8.9–10.3)
Chloride: 90 mmol/L — ABNORMAL LOW (ref 98–111)
Creatinine, Ser: 5.75 mg/dL — ABNORMAL HIGH (ref 0.44–1.00)
GFR calc Af Amer: 7 mL/min — ABNORMAL LOW (ref >60)
GFR calc non Af Amer: 6 mL/min — ABNORMAL LOW (ref >60)
Glucose, Bld: 93 mg/dL (ref 70–99)
Potassium: 5.5 mmol/L — ABNORMAL HIGH (ref 3.5–5.1)
Sodium: 135 mmol/L (ref 135–145)
Total Bilirubin: 1.3 mg/dL — ABNORMAL HIGH (ref 0.3–1.2)
Total Protein: 5.7 g/dL — ABNORMAL LOW (ref 6.5–8.1)

## 2019-06-11 LAB — CBC
HCT: 33.1 % — ABNORMAL LOW (ref 36.0–46.0)
Hemoglobin: 10.2 g/dL — ABNORMAL LOW (ref 12.0–15.0)
MCH: 30.9 pg (ref 26.0–34.0)
MCHC: 30.8 g/dL (ref 30.0–36.0)
MCV: 100.3 fL — ABNORMAL HIGH (ref 80.0–100.0)
Platelets: 130 10*3/uL — ABNORMAL LOW (ref 150–400)
RBC: 3.3 MIL/uL — ABNORMAL LOW (ref 3.87–5.11)
RDW: 15.2 % (ref 11.5–15.5)
WBC: 3.5 10*3/uL — ABNORMAL LOW (ref 4.0–10.5)
nRBC: 0 % (ref 0.0–0.2)

## 2019-06-11 LAB — GLUCOSE, CAPILLARY
Glucose-Capillary: 76 mg/dL (ref 70–99)
Glucose-Capillary: 87 mg/dL (ref 70–99)
Glucose-Capillary: 105 mg/dL — ABNORMAL HIGH (ref 70–99)

## 2019-06-11 MED ORDER — CALCIUM GLUCONATE-NACL 1-0.675 GM/50ML-% IV SOLN
1.0000 g | Freq: Once | INTRAVENOUS | Status: AC
  Administered 2019-06-11: 1000 mg via INTRAVENOUS
  Filled 2019-06-11: qty 50

## 2019-06-11 MED ORDER — SODIUM ZIRCONIUM CYCLOSILICATE 10 G PO PACK
10.0000 g | PACK | ORAL | Status: AC
  Administered 2019-06-11 (×3): 10 g via ORAL
  Filled 2019-06-11 (×3): qty 1

## 2019-06-11 MED ORDER — ORAL CARE MOUTH RINSE
15.0000 mL | Freq: Two times a day (BID) | OROMUCOSAL | Status: DC
  Administered 2019-06-11 – 2019-06-16 (×10): 15 mL via OROMUCOSAL

## 2019-06-11 NOTE — Progress Notes (Signed)
Palliative: Mrs. Marcon is lying quietly in bed.  She appears acutely/chronically ill and frail.  She will briefly open one eye when I call her name.  She is in no acute distress, and I am not sure if she is able to make her basic needs known.  There is no family at bedside at this time.  Mrs. Sarchet is able to take a sip of apple juice without overt signs and symptoms of aspiration.  I ask if she enjoys her apple juice and she mumbles her name.  I asked her again about the apple juice and again she mumbles her name.  Call to son, Karoline Fleer.  We talk about Mrs. Lozon's acute health concerns.  I share that her kidney function is not improved, we talk about her pericardial effusion causing low heart rate and low blood pressure.   Ethelene Browns states that when he visited with his mother last night she was "alert", asking questions (although he endorses it was not a normal conversation).  He is tells me that he "felt things were different from what information I had gotten".  He does tell me that bedside nursing staff told him that Mrs. Alvi did not interact with her in meaningful way through the day.  I share with Ethelene Browns that most of the time patients interact/eat better for family.  We expect this.  We reviewed selected labs and the treatment plan.  We talked about some what if's and maybe's.  I ask if Mrs. Espino would be okay with living in a long-term care center, sharing that she is getting weaker every day.  Ethelene Browns shares that she would not want to live in a nursing home.  We talked about meaningful recovery, what this would look like.  I share that at this point Mrs. Petrak is not awake enough to eat and drink enough to keep her going.  Ethelene Browns states that she would not want "extraordinary measures" such as PEG tube.  We talked about residential hospice if there is no meaningful recovery.  We talked about comfort and dignity, offering food and liquids, but letting nature take its course.  I share that  the medical team understands that Mrs. Brenes was very independent and active prior to this acute illness, endorsing that this makes decision making complicated.  Conference with attending, bedside nursing staff, transition of care team related to patient condition, needs, goals of care.  PMT to follow-up.  Plan: At this point continue to treat the treatable but no CPR or intubation, no escalation of care.  No HD, no vasopressors at this time.    40 minutes Lillia Carmel, NP Palliative Medicine Team Team Phone # [phone] Greater than 50% of this time was spent counseling and coordinating care related to the above assessment and plan.

## 2019-06-11 NOTE — Progress Notes (Addendum)
Patient continued to have soft blood pressures today (See flow sheet) & heart rate noted to drop in the 40's at times but mainly stayed in the 50's. Dr Marisa Severin and Mickie Bail NP aware. Patient tolerated clear liquid diet well with staff assist with feeding.  @1750 -Patient son is at bedside with patient, emotional support provided. Son aware that patient's blood pressures have been low and heart rate is bradycardic and in the 40 & 50's. Patient is alert with no complaints of pain, shortness of breath, chest pain, dizziness, nausea or vomiting.

## 2019-06-11 NOTE — Progress Notes (Signed)
[facility] Progress Note    Assessment/ Plan:   1.  AKI on CKD 3: sig AKI from 1.2 (3/1) to 6.12 in a matter of days.  Some hypotension in ED and in ICU.  Looks like hemodynamic issues primarily but will also check [place] given history of retention in ED--> no hydro 3/10.  Strict I and O. Would leave Foley in for now. No HD per son in [place] discussion.  Supportive care- would not give more fluids for now.  Greatly appreciate palliative care.    2.  Hyperkalemia: up to 7.2--> 5.5 s/p aggressive management  3.  Acute metabolic encephalopathy: CT head negative, appears to be multifactorial with UTI, gabapentin, azotemia/ uremia.  Consider repeating ABG--> looks like compensated hypercarbia  4.  UTI: urine looks dirty, multiple allergies, would avoid quinolones with hyperK and AMS, on aztreonam for now, urine culture pending  5.  Hypertension: hold lisinopril--> she is hypotensive here--> sepsis + pericardial effusion?  No pressors per son  6.  Large pericardial effusion: consider repeat TTE +/- cardiology c/s to assess for tamponade--> she has very sig JVD and hypotension--> if no aggressive measures being pursued don't think that would change our management much  7.  Dispo:  ICU  Subjective:    Remains encephalopathic.  Bps still low.  Having some UOP.  Primary MD and palliative having talks with son--> no pressors or hemodialysis desired.  K is down to 5.5.  Cr still up at 5.83 with BUN in the 110s   Objective:   BP (!) 80/43   Pulse (!) 57   Temp (!) 97.4 F (36.3 C) (Oral)   Resp 13   Ht 5\' 2"  (1.575 m)   Wt 81.2 kg   SpO2 92%   BMI 32.74 kg/m   Intake/Output Summary (Last 24 hours) at 06/11/2019 1041 Last data filed at 06/11/2019 4098 Gross per 24 hour  Intake 154.03 ml  Output 475 ml  Net -320.97 ml   Weight change: 7.2 kg  Physical Exam: GEN ill-appearing, lying in bed HEENT eyes closed NECK JVD up to angle of mandible on both sides PULM clear  anteriorly CV RRR dim heart sounds ABD soft, + suprapubic tenderness EXT trace LE edema, warm NEURO encephalopathic, doesn't wake up for me this AM SKIN warm and dry  Imaging: CT HEAD WO CONTRAST  Result Date: 06/09/2019 CLINICAL DATA:  86 year old female with altered mental status common cephalopathy. EXAM: CT HEAD WITHOUT CONTRAST TECHNIQUE: Contiguous axial images were obtained from the base of the skull through the vertex without intravenous contrast. COMPARISON:  Brain MRI 02/25/2018. Head CT 05/24/2019. FINDINGS: Brain: Perivascular space at the inferior left basal ganglia again noted (normal variant). Cerebral volume is within normal limits for age. No midline shift, ventriculomegaly, mass effect, evidence of mass lesion, intracranial hemorrhage or evidence of cortically based acute infarction. Gray-white matter differentiation appears stable and within normal limits for age. Vascular: Calcified atherosclerosis at the skull base. No suspicious intracranial vascular hyperdensity. Skull: No acute osseous abnormality identified. Sinuses/Orbits: Visualized paranasal sinuses and mastoids are stable and well pneumatized. Other: No acute orbit or scalp soft tissue finding. IMPRESSION: Non contrast CT appearance of the brain remains stable since last month and within normal limits for age. Electronically Signed   By: Genevie Ann M.D.   On: 06/09/2019 22:41   US RENAL  Result Date: 06/10/2019 CLINICAL DATA:  Acute renal injury EXAM: RENAL / URINARY TRACT ULTRASOUND COMPLETE COMPARISON:  04/10/2017 FINDINGS: Right  Kidney: Renal measurements: 12.5 x 4.4 x 5.0 cm. = volume: 145 mL. No mass lesion or hydronephrosis is noted. Mild increased echogenicity is seen. Left Kidney: Renal measurements: 12.2 x 5.7 x 6.0 cm = volume: 217 mL. Mild increased echogenicity is noted. Scattered cysts are noted throughout the left kidney. The largest of these measures 2.3 cm and is simple in nature. The cysts are all clustered in  the midportion of the kidney. No obstructive changes are seen. Bladder: Decompressed Other: None. IMPRESSION: Increased echogenicity consistent with medical renal disease. Left renal cysts. Electronically Signed   By: Alcide Clever M.D.   On: 06/10/2019 15:20    Labs: BMET Recent Labs  Lab 06/09/19 1740 06/09/19 2318 06/10/19 0334 06/10/19 1310 06/10/19 1831 06/11/19 0923  NA 128* 132* 132* 133* 132* 135  K 7.2* 6.5* 6.7* 6.1* 6.1* 5.5*  CL 84* 88* 89* 91* 89* 90*  CO2 34* 35* 35* 33* 33* 34*  GLUCOSE 101* 55* 86 85 90 93  BUN 112* 111* 105* 113* 112* 116*  CREATININE 6.12* 6.19* 5.96* 5.86* 5.89* 5.75*  CALCIUM 8.8* 8.6* 8.5* 8.9 8.8* 8.8*  PHOS  --   --   --  5.4* 6.0*  --    CBC Recent Labs  Lab 06/09/19 1740 06/10/19 0334 06/11/19 0923  WBC 5.3 4.8 3.5*  NEUTROABS 3.9  --   --   HGB 11.5* 10.4* 10.2*  HCT 36.1 33.3* 33.1*  MCV 97.6 99.7 100.3*  PLT 169 156 130*    Medications:    . Chlorhexidine Gluconate Cloth  6 each Topical Daily  . heparin  5,000 Units Subcutaneous Q8H  . sodium zirconium cyclosilicate  10 g Oral Q4H      Bufford Buttner, MD 06/11/2019, 10:41 AM

## 2019-06-11 NOTE — Progress Notes (Signed)
Patient Demographics:    [name], is a 86 y.o. female, DOB - 1932/11/19, ZOX:[id number]  Admit date - 06/09/2019   Admitting Physician Ejiroghene Wendall Stade, MD  Outpatient Primary MD for the patient is Raliegh Ip, DO  LOS - 2   Chief Complaint  Patient presents with  . Altered Mental Status  . hyperkalemia        Subjective:    [name] today has no fevers, no emesis,  No chest pain, resting comfortably  -More awake, tolerated clear liquid diet okay, son visiting this evening  Assessment  & Plan :    Principal Problem:   Acute metabolic encephalopathy Active Problems:   Goals of care, counseling/discussion   Palliative care encounter   AF (atrial fibrillation) (HCC)   Hyperkalemia   Chronic diastolic CHF (congestive heart failure) (HCC)   Acute kidney injury superimposed on CKD (HCC)   Acute renal failure (HCC)   Encounter for hospice care discussion  Brief Summary:- 86 y.o. female with medical history significant for atrial fibrillation, hypertension, congestive heart failure.  Admitted on 06/09/2019 with acute metabolic encephalopathy in the setting of renal failure with presumed uremia/azotemia    A/p 1)Social/Ethics-extensive conversation with the patient's son about goals of care, patient remained hemodynamically unstable with low BP, patient remains encephalopathic -Patient son states that patient would not want aggressive interventions -Specifically no hemodialysis, no pressors, -Patient son requested that we do not escalate care at this time, patient son requests status quo for now.  He wants to see how his mom does over the next day or 2 prior to making further decisions -Patient remains a DNR/DNI --ABG with mostly "compensated" Hypercarpnia  2)Aki on CKD IIIb with Persistent Hyperkalemia--- potassium down to 5.5 from a peak of 7.2 after hyperkalemia cocktail and  Lokelma x3 doses -Baseline creatinine 1.2 just over a week PTA -Discussed with nephrology service patient appears somewhat volume overloaded, advised no further IV fluids -Renal ultrasound without obstructive uropathy -Keep Foley in for accurate ins and O -Patient son declines HD  3) acute metabolic encephalopathy--- waking up more, able to answer simple questions, suspect uremia/azotemia related, gram-negative rod UTI is probably contributory as well ABG with mostly compensated hypercapnia, CT head without acute findings  4) E. coli and Klebsiella UTI--- continue aztreonam pending culture results, patient with allergies to sulfa, cephalosporins and doxycycline  HTN--- c/n Hold Lisinopril due to hypotension and hyperkalemia  4)Large Pericardial Effusion--this is probably contributing to patient's hypotension  5)Permanent Atrial Fibrillation--- Eliquis on hold, patient is actually bradycardic without any AV nodal blocking agents  6)HFpEF/chronic diastolic dysfunction CHF----patient is on hold due to low blood pressure and kidney concerns.  -Clinically somewhat volume overloaded but BP too low to allow for diuresis   Disposition/Need for in-Hospital Stay- patient unable to be discharged at this time due to --- worsening renal function, persistent hyperkalemia, persistent hypotension-requiring interventions especially for hyperkalemia and IV antibiotics for E. coli and Klebsiella UTI -Not medically ready for discharge, remain in ICU for now  Code Status : Full code  Family Communication:  Discussed with son  Consults  : Cardiology and nephrology  DVT Prophylaxis  :    - Heparin - SCDs   Lab Results  Component  Value Date   PLT 130 (L) 06/11/2019    Inpatient Medications  Scheduled Meds: . Chlorhexidine Gluconate Cloth  6 each Topical Daily  . heparin  5,000 Units Subcutaneous Q8H  . mouth rinse  15 mL Mouth Rinse BID   Continuous Infusions: . aztreonam Stopped (06/11/19 1352)    PRN Meds:.    Anti-infectives (From admission, onward)   Start     Dose/Rate Route Frequency Ordered Stop   06/10/19 1400  aztreonam (AZACTAM) 1 g in sodium chloride 0.9 % 100 mL IVPB  Status:  Discontinued     1 g 200 mL/hr over 30 Minutes Intravenous Every 12 hours 06/10/19 1317 06/10/19 1319   06/10/19 1400  aztreonam (AZACTAM) 0.5 g in dextrose 5 % 50 mL IVPB     0.5 g 100 mL/hr over 30 Minutes Intravenous Every 8 hours 06/10/19 1320          Objective:   Vitals:   06/11/19 1630 06/11/19 1700 06/11/19 1730 06/11/19 1800  BP: (!) 82/49 (!) 97/50 (!) 105/51 (!) 86/46  Pulse: (!) 58 66 (!) 55 (!) 52  Resp: 15 16 16 17   Temp:      TempSrc:      SpO2: 98% 96% 95% 96%  Weight:      Height:        Wt Readings from Last 3 Encounters:  06/11/19 81.2 kg  06/01/19 74.7 kg  05/19/19 95.6 kg     Intake/Output Summary (Last 24 hours) at 06/11/2019 1832 Last data filed at 06/11/2019 1624 Gross per 24 hour  Intake 200 ml  Output 725 ml  Net -525 ml   Physical Exam  Gen:-waking up more, able to answer simple questions HEENT:- [name].AT, No sclera icterus Neck-Supple Neck, significant JVD elevation noted.  Lungs-diminished in bases without wheezing  CV- S1, S2 normal, irregular Abd-  +ve B.Sounds, Abd Soft, No tenderness,    Extremity/Skin:-  Trace edema, pedal pulses present , significant lower extremity varicosities Psych-lethargic, somewhat disoriented Neuro-generalized weakness, no new focal deficits,    Data Review:   Micro Results Recent Results (from the past 240 hour(s))  Respiratory Panel by RT PCR (Flu A&B, Covid) - Nasopharyngeal Swab     Status: None   Collection Time: 06/02/19  2:11 PM   Specimen: Nasopharyngeal Swab  Result Value Ref Range Status   SARS Coronavirus 2 by RT PCR NEGATIVE NEGATIVE Final    Comment: (NOTE) SARS-CoV-2 target nucleic acids are NOT DETECTED. The SARS-CoV-2 RNA is generally detectable in upper respiratoy specimens during the  acute phase of infection. The lowest concentration of SARS-CoV-2 viral copies this assay can detect is 131 copies/mL. A negative result does not preclude SARS-Cov-2 infection and should not be used as the sole basis for treatment or other patient management decisions. A negative result may occur with  improper specimen collection/handling, submission of specimen other than nasopharyngeal swab, presence of viral mutation(s) within the areas targeted by this assay, and inadequate number of viral copies (<131 copies/mL). A negative result must be combined with clinical observations, patient history, and epidemiological information. The expected result is Negative. Fact Sheet for Patients:  08/02/19 Fact Sheet for Healthcare Providers:  https://www.moore.com/ This test is not yet ap proved or cleared by the https://www.young.biz/ FDA and  has been authorized for detection and/or diagnosis of SARS-CoV-2 by FDA under an Emergency Use Authorization (EUA). This EUA will remain  in effect (meaning this test can be used) for the duration of the  COVID-19 declaration under Section 564(b)(1) of the Act, 21 U.S.C. section 360bbb-3(b)(1), unless the authorization is terminated or revoked sooner.    Influenza A by PCR NEGATIVE NEGATIVE Final   Influenza B by PCR NEGATIVE NEGATIVE Final    Comment: (NOTE) The Xpert Xpress SARS-CoV-2/FLU/RSV assay is intended as an aid in  the diagnosis of influenza from Nasopharyngeal swab specimens and  should not be used as a sole basis for treatment. Nasal washings and  aspirates are unacceptable for Xpert Xpress SARS-CoV-2/FLU/RSV  testing. Fact Sheet for Patients: https://www.moore.com/ Fact Sheet for Healthcare Providers: https://www.young.biz/ This test is not yet approved or cleared by the [place] FDA and  has been authorized for detection and/or diagnosis of SARS-CoV-2  by  FDA under an Emergency Use Authorization (EUA). This EUA will remain  in effect (meaning this test can be used) for the duration of the  Covid-19 declaration under Section 564(b)(1) of the Act, 21  U.S.C. section 360bbb-3(b)(1), unless the authorization is  terminated or revoked. Performed at [facility], [address] [place], [place] [address]   MRSA PCR Screening     Status: None   Collection Time: 06/10/19  5:03 AM   Specimen: Nasal Mucosa; Nasopharyngeal  Result Value Ref Range Status   MRSA by PCR NEGATIVE NEGATIVE Final    Comment:        The GeneXpert MRSA Assay (FDA approved for NASAL specimens only), is one component of a comprehensive MRSA colonization surveillance program. It is not intended to diagnose MRSA infection nor to guide or monitor treatment for MRSA infections. Performed at [facility], [address], [place], [place] [address]   Culture, Urine     Status: Abnormal (Preliminary result)   Collection Time: 06/10/19  5:18 AM   Specimen: Urine, Clean Catch  Result Value Ref Range Status   Specimen Description   Final    URINE, CLEAN CATCH Performed at [facility], [address] [place], [place] [address]    Special Requests   Final    NONE Performed at [facility], [address], [place], [place] [address]    Culture (A)  Final    >=100,000 COLONIES/mL ESCHERICHIA COLI >=100,000 COLONIES/mL KLEBSIELLA PNEUMONIAE SUSCEPTIBILITIES TO FOLLOW Performed at [facility], [address], [place], [place] [address]    Report Status PENDING  Incomplete    Radiology Reports CT HEAD WO CONTRAST  Result Date: 06/09/2019 CLINICAL DATA:  86 year old female with altered mental status common cephalopathy. EXAM: CT HEAD WITHOUT CONTRAST TECHNIQUE: Contiguous axial images were obtained from the base of the skull through the vertex without intravenous contrast. COMPARISON:  Brain MRI 02/25/2018. Head CT 05/24/2019. FINDINGS: Brain: Perivascular space at  the inferior left basal ganglia again noted (normal variant). Cerebral volume is within normal limits for age. No midline shift, ventriculomegaly, mass effect, evidence of mass lesion, intracranial hemorrhage or evidence of cortically based acute infarction. Gray-white matter differentiation appears stable and within normal limits for age. Vascular: Calcified atherosclerosis at the skull base. No suspicious intracranial vascular hyperdensity. Skull: No acute osseous abnormality identified. Sinuses/Orbits: Visualized paranasal sinuses and mastoids are stable and well pneumatized. Other: No acute orbit or scalp soft tissue finding. IMPRESSION: Non contrast CT appearance of the brain remains stable since last month and within normal limits for age. Electronically Signed   By: Odessa Fleming M.D.   On: 06/09/2019 22:41   CT HEAD WO CONTRAST  Result Date: 05/24/2019 CLINICAL DATA:  86 year old female with altered  mental status. Admitted with respiratory failure. EXAM: CT HEAD WITHOUT CONTRAST TECHNIQUE: Contiguous axial images were obtained from the base of the skull through the vertex without intravenous contrast. COMPARISON:  Brain MRI 02/25/2018. FINDINGS: Brain: Cerebral volume is stable and within normal limits for age. There is a chronic perivascular space at the inferior left lentiform (normal variant). No midline shift, ventriculomegaly, mass effect, evidence of mass lesion, intracranial hemorrhage or evidence of cortically based acute infarction. Normal for age gray-white matter differentiation. Vascular: Calcified atherosclerosis at the skull base. Skull: No acute osseous abnormality identified. Sinuses/Orbits: Visualized paranasal sinuses and mastoids are stable and well pneumatized. Other: No acute orbit or scalp soft tissue finding. Scalp vessels are prominent diffusely, although unchanged from the prior MRI. IMPRESSION: Normal for age non contrast CT appearance of the brain. Electronically Signed   By: Odessa FlemingH  Hall  M.D.   On: 05/24/2019 18:19   US RENAL  Result Date: 06/10/2019 CLINICAL DATA:  Acute renal injury EXAM: RENAL / URINARY TRACT ULTRASOUND COMPLETE COMPARISON:  04/10/2017 FINDINGS: Right Kidney: Renal measurements: 12.5 x 4.4 x 5.0 cm. = volume: 145 mL. No mass lesion or hydronephrosis is noted. Mild increased echogenicity is seen. Left Kidney: Renal measurements: 12.2 x 5.7 x 6.0 cm = volume: 217 mL. Mild increased echogenicity is noted. Scattered cysts are noted throughout the left kidney. The largest of these measures 2.3 cm and is simple in nature. The cysts are all clustered in the midportion of the kidney. No obstructive changes are seen. Bladder: Decompressed Other: None. IMPRESSION: Increased echogenicity consistent with medical renal disease. Left renal cysts. Electronically Signed   By: Alcide CleverMark  Lukens M.D.   On: 06/10/2019 15:20   DG CHEST PORT 1 VIEW  Result Date: 05/27/2019 CLINICAL DATA:  Bilateral pleural effusion and pericardial effusion, negative COVID-19 testing 8 days prior EXAM: PORTABLE CHEST 1 VIEW COMPARISON:  Radiograph 05/24/2019 FINDINGS: Diminishing lung volumes with persistent bilateral interstitial and airspace opacities, slightly increasing in the right upper and lower lung though this may be attributable to the lower volumes. Stable cardiomegaly. Bilateral effusions. No pneumothorax. No acute osseous or soft tissue abnormality. Telemetry leads overlie the chest. IMPRESSION: Diminishing lung volumes with persistent bilateral interstitial and airspace opacities, slightly increasing in the right upper and lower lung though this may be attributable to lower volumes. Bilateral effusions. Electronically Signed   By: Kreg ShropshirePrice  DeHay M.D.   On: 05/27/2019 05:58   DG CHEST PORT 1 VIEW  Result Date: 05/24/2019 CLINICAL DATA:  Acute on chronic respiratory failure. EXAM: PORTABLE CHEST 1 VIEW COMPARISON:  05/19/2019 FINDINGS: Patient is rotated to the right. There is mild motion artifact.  Lungs are adequately inflated with interval worsening bibasilar opacification likely moderate bilateral effusions with associated bibasilar atelectasis. Infection in the mid to lower lungs is possible. Stable cardiomegaly with mild prominence of the perihilar markings suggesting mild vascular congestion. Remainder the exam is unchanged. IMPRESSION: Mild cardiomegaly with mild vascular congestion. Worsening bilateral moderate pleural effusions likely with associated bibasilar atelectasis. Infection in the lung bases is possible. Electronically Signed   By: Elberta Fortisaniel  Boyle M.D.   On: 05/24/2019 10:40   DG Chest Port 1 View  Result Date: 05/19/2019 CLINICAL DATA:  Decreased oxygen saturation today. Lower extremity swelling. EXAM: PORTABLE CHEST 1 VIEW COMPARISON:  PA and lateral chest 05/28/2017. FINDINGS: There is massive cardiomegaly. Small to moderate bilateral pleural effusions are seen, larger on the right. Pulmonary edema and basilar atelectasis are seen. No pneumothorax. No acute bony abnormality.  Avascular necrosis of the humeral heads is noted. IMPRESSION: Cardiomegaly with interstitial pulmonary edema and small to moderate pleural effusions, larger on the right. Electronically Signed   By: Drusilla Kanner M.D.   On: 05/19/2019 12:25   ECHOCARDIOGRAM COMPLETE  Result Date: 05/20/2019    ECHOCARDIOGRAM REPORT   Patient Name:   [name] Date of Exam: 05/20/2019 Medical Rec #:  [id number]      Height:       64.0 in Accession #:    [id number]     Weight:       208.8 lb Date of Birth:  1932/11/01      BSA:          1.99 m Patient Age:    86 years       BP:           100/52 mmHg Patient Gender: F              HR:           73 bpm. [facility] Procedure: 2D Echo Indications:    CHF-Acute Diastolic 428.31 / I50.31  History:        Patient has prior history of Echocardiogram examinations, most                 recent 03/16/2015. Arrythmias:Atrial Fibrillation; Risk                  Factors:Dyslipidemia, Hypertension and Non-Smoker. Acute                 hypoxemic respiratory failure , First degree atrioventricular                 block, GERD, Non-rheumatic mitral regurgitation.  Sonographer:    [name] RDCS (AE) Referring Phys: [phone] [facility] IMPRESSIONS  1. Left ventricular ejection fraction, by estimation, is 60 to 65%. The left ventricle has normal function. The left ventricle has no regional wall motion abnormalities. There is mild concentric left ventricular hypertrophy. Left ventricular diastolic parameters are indeterminate. Elevated left ventricular end-diastolic pressure.  2. Right ventricular systolic function is normal. The right ventricular size is mildly enlarged. There is severely elevated pulmonary artery systolic pressure.  3. Left atrial size was severely dilated.  4. Right atrial size was severely dilated.  5. IVC collapses. No RA or RV collapse. No signs of tamponade. Large pericardial effusion. The pericardial effusion is circumferential.  6. The mitral valve is grossly normal. Mild mitral valve regurgitation.  7. Tricuspid valve regurgitation is moderate.  8. The aortic valve is tricuspid. Aortic valve regurgitation is mild. Mild aortic valve stenosis. Aortic valve area, by VTI measures 1.57 cm. Aortic valve mean gradient measures 17.0 mmHg. Aortic valve Vmax measures 2.79 m/s.  9. The inferior vena cava is dilated in size with >50% respiratory variability, suggesting right atrial pressure of 8 mmHg. FINDINGS  Left Ventricle: Left ventricular ejection fraction, by estimation, is 60 to 65%. The left ventricle has normal function. The left ventricle has no regional wall motion abnormalities. The left ventricular internal cavity size was normal in size. There is  mild concentric left ventricular hypertrophy. Left ventricular diastolic parameters are indeterminate. Elevated left ventricular end-diastolic pressure. Right Ventricle: The right ventricular size is  mildly enlarged. No increase in right ventricular wall thickness. Right ventricular systolic function is normal. There is severely elevated pulmonary artery systolic pressure. The tricuspid regurgitant velocity is 4.02 m/s, and with an assumed right atrial  pressure of 10 mmHg, the estimated right ventricular systolic pressure is 67.8 mmHg. Left Atrium: Left atrial size was severely dilated. Right Atrium: Right atrial size was severely dilated. Pericardium: IVC collapses. No RA or RV collapse. No signs of tamponade. A large pericardial effusion is present. The pericardial effusion is circumferential. Mitral Valve: The mitral valve is grossly normal. Mild mitral valve regurgitation. Tricuspid Valve: The tricuspid valve is grossly normal. Tricuspid valve regurgitation is moderate. Aortic Valve: The aortic valve is tricuspid. Aortic valve regurgitation is mild. Aortic regurgitation PHT measures 540 msec. Mild aortic stenosis is present. Moderate aortic valve annular calcification. There is mild calcification of the aortic valve. Aortic valve mean gradient measures 17.0 mmHg. Aortic valve peak gradient measures 31.1 mmHg. Aortic valve area, by VTI measures 1.57 cm. Pulmonic Valve: The pulmonic valve was grossly normal. Pulmonic valve regurgitation is not visualized. Aorta: The aortic root is normal in size and structure. Venous: The inferior vena cava is dilated in size with greater than 50% respiratory variability, suggesting right atrial pressure of 8 mmHg. IAS/Shunts: No atrial level shunt detected by color flow Doppler.  LEFT VENTRICLE PLAX 2D LVIDd:         5.12 cm  Diastology LVIDs:         3.07 cm  LV e' lateral:   8.81 cm/s LV PW:         1.34 cm  LV E/e' lateral: 15.3 LV IVS:        1.15 cm  LV e' medial:    6.53 cm/s LVOT diam:     1.80 cm  LV E/e' medial:  20.7 LV SV:         91.86 ml LV SV Index:   41.66 LVOT Area:     2.54 cm  LEFT ATRIUM              Index       RIGHT ATRIUM           Index LA diam:         4.90 cm  2.46 cm/m  RA Area:     38.50 cm LA Vol (A2C):   186.0 ml 93.35 ml/m RA Volume:   166.00 ml 83.32 ml/m LA Vol (A4C):   160.0 ml 80.30 ml/m LA Biplane Vol: 173.0 ml 86.83 ml/m  AORTIC VALVE AV Area (Vmax):    1.62 cm AV Area (Vmean):   1.49 cm AV Area (VTI):     1.57 cm AV Vmax:           278.67 cm/s AV Vmean:          192.333 cm/s AV VTI:            0.584 m AV Peak Grad:      31.1 mmHg AV Mean Grad:      17.0 mmHg LVOT Vmax:         177.33 cm/s LVOT Vmean:        112.667 cm/s LVOT VTI:          0.361 m LVOT/AV VTI ratio: 0.62 AI PHT:            540 msec  AORTA Ao Root diam: 2.80 cm MITRAL VALVE                TRICUSPID VALVE MV Area (PHT): 2.42 cm     TR Peak grad:   64.6 mmHg MV Decel Time: 313 msec     TR Vmax:  402.00 cm/s MR Peak grad: 63.0 mmHg MR Mean grad: 45.0 mmHg     SHUNTS MR Vmax:      397.00 cm/s   Systemic VTI:  0.36 m MR Vmean:     320.0 cm/s    Systemic Diam: 1.80 cm MV E velocity: 135.00 cm/s MV A velocity: 39.40 cm/s MV E/A ratio:  3.43 Prentice Docker MD Electronically signed by Prentice Docker MD Signature Date/Time: 05/20/2019/2:55:41 PM    Final    ECHOCARDIOGRAM LIMITED  Result Date: 05/23/2019    ECHOCARDIOGRAM LIMITED REPORT   Patient Name:   [name] Date of Exam: 05/23/2019 Medical Rec #:  [id number]      Height:       64.0 in Accession #:    [id number]     Weight:       195.1 lb Date of Birth:  Jul 15, 1932      BSA:          1.936 m Patient Age:    86 years       BP:           129/70 mmHg Patient Gender: F              HR:           78 bpm. [facility] Procedure: Limited Echo and Cardiac Doppler Indications:    Pericardial Effusion 423.9 / l31.3  History:        Patient has prior history of Echocardiogram examinations, most                 recent 05/20/2019. CHF, Arrythmias:Atrial Fibrillation; Risk                 Factors:Hypertension and Dyslipidemia. Non-Smoker. Acute                 hypoxemic respiratory failure , First degree  atrioventricular                 block, GERD, Non-rheumatic mitral regurgitation.  Sonographer:    Celesta Gentile RCS Referring Phys: [phone] [facility] IMPRESSIONS  1. Limited study.  2. Large pericardial effusion, circumferential but to greater degree posterior and lateral. Dimensions are similar to previous study on February 17. No obvious significant respiratory variation in mitral outflow to suggest tamponade physiology. There is  no compression of RA or RV either, but evidence of significant pulmonary hypertension (last study RVSP was approximately 75 mmHg) and this could be inhibiting compression of RV chamber. Organizing material adherent to pericardium suggests possible chronicity, particularly given effusion size.  3. Left ventricular ejection fraction, by estimation, is 60 to 65%. The left ventricle has normal function. The left ventricle has no regional wall motion abnormalities. There is the interventricular septum is flattened in systole and diastole, consistent with right ventricular pressure and volume overload.  4. The right ventricular size is moderately enlarged.  5. Left atrial size was severely dilated.  6. Right atrial size was severely dilated.  7. The mitral valve is grossly normal. FINDINGS  Left Ventricle: Left ventricular ejection fraction, by estimation, is 60 to 65%. The left ventricle has normal function. The left ventricle has no regional wall motion abnormalities. The interventricular septum is flattened in systole and diastole, consistent with right ventricular pressure and volume overload. Right Ventricle: The right ventricular size is moderately enlarged. Left Atrium: Left atrial size was severely dilated. Right Atrium: Right atrial size was severely dilated. Pericardium: Dimensions are similar to  previous study on February 17. No obvious significant respiratory variation in mitral outflow to suggest tamponade physiology. There is no compression of RA or RV either, but evidence  of significant pulmonary hypertension (last study RVSP was approximately 75 mmHg) and this could be inhibiting compression of RV chamber. Organizing material adherent to pericardium suggests possible chronicity, particularly given effusion size. A large pericardial effusion is present. The pericardial effusion is circumferential. Mitral Valve: The mitral valve is grossly normal. Mild to moderate mitral annular calcification. Additional Comments: There is pleural effusion in the left lateral region. Nona Dell MD Electronically signed by Nona Dell MD Signature Date/Time: 05/23/2019/2:09:38 PM    Final    DG ESOPHAGUS W SINGLE CM (SOL OR THIN BA)  Result Date: 05/22/2019 CLINICAL DATA:  DYSPHAGIA. EXAM: ESOPHOGRAM/BARIUM SWALLOW TECHNIQUE: Single contrast examination was performed using thin barium or water soluble. FLUOROSCOPY TIME:  Fluoroscopy Time: 2 minutes 18 seconds Radiation Exposure Index (if provided by the fluoroscopic device): 38.6 mGy COMPARISON:  None. FINDINGS: The patient ingested thin barium in a semi upright position. The patient has poor esophageal peristalsis with persistent spasm in the distal esophagus consistent with achalasia. I placed the patient in an almost upright position and only a small amount of barium trickled into the nondistended stomach. IMPRESSION: Achalasia. Minimal emptying of the dilated esophagus. Minimal peristalsis in the esophagus. The patient tolerated the procedure well. No immediate complications. Electronically Signed   By: Francene Boyers M.D.   On: 05/22/2019 11:18    CBC Recent Labs  Lab 06/09/19 1740 06/10/19 0334 06/11/19 0923  WBC 5.3 4.8 3.5*  HGB 11.5* 10.4* 10.2*  HCT 36.1 33.3* 33.1*  PLT 169 156 130*  MCV 97.6 99.7 100.3*  MCH 31.1 31.1 30.9  MCHC 31.9 31.2 30.8  RDW 15.6* 15.3 15.2  LYMPHSABS 0.7  --   --   MONOABS 0.7  --   --   EOSABS 0.1  --   --   BASOSABS 0.0  --   --     Chemistries  Recent Labs  Lab 06/09/19 1740  06/09/19 1740 06/09/19 2318 06/10/19 0334 06/10/19 1310 06/10/19 1831 06/11/19 0923  NA 128*   < > 132* 132* 133* 132* 135  K 7.2*   < > 6.5* 6.7* 6.1* 6.1* 5.5*  CL 84*   < > 88* 89* 91* 89* 90*  CO2 34*   < > 35* 35* 33* 33* 34*  GLUCOSE 101*   < > 55* 86 85 90 93  BUN 112*   < > 111* 105* 113* 112* 116*  CREATININE 6.12*   < > 6.19* 5.96* 5.86* 5.89* 5.75*  CALCIUM 8.8*   < > 8.6* 8.5* 8.9 8.8* 8.8*  MG 2.9*  --   --   --   --   --   --   AST 32  --   --   --   --   --  22  ALT 21  --   --   --   --   --  17  ALKPHOS 135*  --   --   --   --   --  111  BILITOT 1.2  --   --   --   --   --  1.3*   < > = values in this interval not displayed.   ------------------------------------------------------------------------------------------------------------------ No results for input(s): CHOL, HDL, LDLCALC, TRIG, CHOLHDL, LDLDIRECT in the last 72 hours.  Lab Results  Component Value Date  HGBA1C 6.2 06/16/2016   ------------------------------------------------------------------------------------------------------------------ No results for input(s): TSH, T4TOTAL, T3FREE, THYROIDAB in the last 72 hours.  Invalid input(s): FREET3 ------------------------------------------------------------------------------------------------------------------ No results for input(s): VITAMINB12, FOLATE, FERRITIN, TIBC, IRON, RETICCTPCT in the last 72 hours.  Coagulation profile No results for input(s): INR, PROTIME in the last 168 hours.  No results for input(s): DDIMER in the last 72 hours.  Cardiac Enzymes No results for input(s): CKMB, TROPONINI, MYOGLOBIN in the last 168 hours.  Invalid input(s): CK ------------------------------------------------------------------------------------------------------------------    Component Value Date/Time   BNP 359.0 (H) 05/19/2019 1200    Shon Hale M.D on 06/11/2019 at 6:32 PM  Go to [email or web address] - for contact info  [facility] -  Office  [phone]

## 2019-06-12 LAB — RENAL FUNCTION PANEL
Albumin: 2.8 g/dL — ABNORMAL LOW (ref 3.5–5.0)
Anion gap: 13 (ref 5–15)
BUN: 118 mg/dL — ABNORMAL HIGH (ref 8–23)
CO2: 34 mmol/L — ABNORMAL HIGH (ref 22–32)
Calcium: 8.8 mg/dL — ABNORMAL LOW (ref 8.9–10.3)
Chloride: 89 mmol/L — ABNORMAL LOW (ref 98–111)
Creatinine, Ser: 5.62 mg/dL — ABNORMAL HIGH (ref 0.44–1.00)
GFR calc Af Amer: 7 mL/min — ABNORMAL LOW (ref >60)
GFR calc non Af Amer: 6 mL/min — ABNORMAL LOW (ref >60)
Glucose, Bld: 94 mg/dL (ref 70–99)
Phosphorus: 6.3 mg/dL — ABNORMAL HIGH (ref 2.5–4.6)
Potassium: 4.7 mmol/L (ref 3.5–5.1)
Sodium: 136 mmol/L (ref 135–145)

## 2019-06-12 LAB — URINE CULTURE: Culture: >=100000 — AB

## 2019-06-12 LAB — GLUCOSE, CAPILLARY
Glucose-Capillary: 109 mg/dL — ABNORMAL HIGH (ref 70–99)
Glucose-Capillary: 112 mg/dL — ABNORMAL HIGH (ref 70–99)
Glucose-Capillary: 112 mg/dL — ABNORMAL HIGH (ref 70–99)
Glucose-Capillary: 99 mg/dL (ref 70–99)

## 2019-06-12 MED ORDER — SODIUM CHLORIDE 0.9 % IV SOLN
3.0000 g | INTRAVENOUS | Status: DC
  Administered 2019-06-13 – 2019-06-16 (×4): 3 g via INTRAVENOUS
  Filled 2019-06-12: qty 8
  Filled 2019-06-12 (×4): qty 3

## 2019-06-12 MED ORDER — SODIUM CHLORIDE 0.9 % IV SOLN
3.0000 g | Freq: Once | INTRAVENOUS | Status: AC
  Administered 2019-06-12: 3 g via INTRAVENOUS
  Filled 2019-06-12: qty 3

## 2019-06-12 NOTE — Progress Notes (Signed)
Pharmacy Antibiotic Note  [name] is a 86 y.o. female admitted on 06/09/2019 with UTI.  Pharmacy was consulted for aztreonam dosing, but patient has tolerated Augmentin in the past, so will start Unasyn.  Urine culture grew E. Col and K. Pneumoniae sensitive to Unasyn.   Plan: Start Unasyn 3g IV q24h Pharmacy will continue to monitor renal function, cultures and patient progress.   Height: 5\' 2"  (157.5 cm) Weight: 179 lb 0.2 oz (81.2 kg) IBW/kg (Calculated) : 50.1  Temp (24hrs), Avg:97.8 F (36.6 C), Min:97.5 F (36.4 C), Max:98 F (36.7 C)  Recent Labs  Lab 06/09/19 1740 06/09/19 1740 06/09/19 2318 06/10/19 0334 06/10/19 1310 06/10/19 1831 06/11/19 0923  WBC 5.3  --   --  4.8  --   --  3.5*  CREATININE 6.12*   < > 6.19* 5.96* 5.86* 5.89* 5.75*   < > = values in this interval not displayed.    Estimated Creatinine Clearance: 7.1 mL/min (A) (by C-G formula based on SCr of 5.75 mg/dL (H)).    Allergies  Allergen Reactions  . Aspirin Nausea Only  . Codeine Nausea Only  . Sulfa Antibiotics   . Vicodin [Hydrocodone-Acetaminophen] Nausea Only  . Warfarin Sodium Other (See Comments)    Tingling all over  . Cephalexin Rash    Red, rash covering lower limbs.  . Doxycycline Rash    Antimicrobials this admission: aztreonam 3/10>>3/12 Unasyn 3/11>>     Dose adjustments this admission: Aztreonam, Unasyn  Microbiology results: 3/10 UCx:  E. coli, K. pneumoniae-->pan sensitive 3/2 Resp PCR: SARS CoV-2 negative; Flu A/B negative 3/10 MRSA PCR: negative  Thank you for allowing pharmacy to be a part of this patient's care.  5/10 06/12/2019 9:00 AM

## 2019-06-12 NOTE — Progress Notes (Signed)
Patient Demographics:    [name], is a 86 y.o. female, DOB - 07/30/1932, ZOX:[id number]  Admit date - 06/09/2019   Admitting Physician Ejiroghene Wendall Stade, MD  Outpatient Primary MD for the patient is Raliegh Ip, DO  LOS - 3   Chief Complaint  Patient presents with  . Altered Mental Status  . hyperkalemia        Subjective:    [name] today has no fevers, no emesis,  No chest pain, resting comfortably  -More awake, able to talk, tolerated full liquid diet okay, son visiting this evening -Some urine output noted  Assessment  & Plan :    Principal Problem:   Acute metabolic encephalopathy Active Problems:   Goals of care, counseling/discussion   Palliative care encounter   AF (atrial fibrillation) (HCC)   Hyperkalemia   Chronic diastolic CHF (congestive heart failure) (HCC)   Acute kidney injury superimposed on CKD (HCC)   Acute renal failure (HCC)   Encounter for hospice care discussion  Brief Summary:- 86 y.o. female with medical history significant for atrial fibrillation, hypertension, congestive heart failure.  Admitted on 06/09/2019 with acute metabolic encephalopathy in the setting of renal failure with presumed uremia/azotemia and UTI    A/p 1)Social/Ethics-extensive conversation with the patient's son about goals of care, patient remaines hemodynamically unstable with low BP, patient remains encephalopathic -Patient son states that patient would not want aggressive interventions -Specifically no hemodialysis, no pressors, -Patient son requested that we do not escalate care at this time, patient son requests status quo for now.  He wants to see how his mom does over the next day or 2 prior to making further decisions -Patient remains a DNR/DNI --ABG with mostly "compensated" Hypercarpnia  2)Aki on CKD IIIb with Persistent Hyperkalemia--- potassium down to 4.7  from a peak of 7.2 after hyperkalemia cocktail and Lokelma x3 doses -Baseline creatinine 1.2 just over a week PTA -Creatinine now above 5 -Discussed with nephrology service patient appears somewhat volume overloaded, advised no further IV fluids -Renal ultrasound without obstructive uropathy -Keep Foley in for accurate ins and O -Patient son declines HD -Lisinopril discontinued  3) acute metabolic encephalopathy--- waking up more, able to answer simple questions, suspect uremia/azotemia related, gram-negative rod UTI is probably contributory as well ABG with mostly compensated hypercapnia, CT head without acute findings  4) E. coli and Klebsiella UTI--- okay to start Unasyn pending sensitivity report, stop  aztreonam pending culture results, patient with allergies to sulfa, cephalosporins and doxycycline  HTN--- c/n Hold Lisinopril due to hypotension and hyperkalemia  4)Large Pericardial Effusion--this is probably contributing to patient's hypotension  5)Permanent Atrial Fibrillation--- Eliquis on hold, patient is actually bradycardic without any AV nodal blocking agents  6)HFpEF/chronic diastolic dysfunction CHF----Lasix on hold due to low blood pressure and kidney concerns.  -Clinically somewhat volume overloaded but BP too low to allow for diuresis   Disposition/Need for in-Hospital Stay- patient unable to be discharged at this time due to --- worsening renal function, persistent hyperkalemia, persistent hypotension-requiring  interventions especially for hyperkalemia and IV antibiotics for E. coli and Klebsiella UTI -Not medically ready for discharge, remain in ICU for now  Code Status : Full code  Family Communication:  Discussed with son  Consults  : Cardiology and nephrology  DVT Prophylaxis  :    - Heparin - SCDs   Lab Results  Component Value Date   PLT 130 (L) 06/11/2019    Inpatient Medications  Scheduled Meds: . Chlorhexidine Gluconate Cloth  6 each Topical Daily    . heparin  5,000 Units Subcutaneous Q8H  . mouth rinse  15 mL Mouth Rinse BID   Continuous Infusions: . [START ON 06/13/2019] ampicillin-sulbactam (UNASYN) IV     PRN Meds:.    Anti-infectives (From admission, onward)   Start     Dose/Rate Route Frequency Ordered Stop   06/13/19 0900  Ampicillin-Sulbactam (UNASYN) 3 g in sodium chloride 0.9 % 100 mL IVPB     3 g 200 mL/hr over 30 Minutes Intravenous Every 24 hours 06/12/19 0900     06/12/19 0900  Ampicillin-Sulbactam (UNASYN) 3 g in sodium chloride 0.9 % 100 mL IVPB     3 g 200 mL/hr over 30 Minutes Intravenous  Once 06/12/19 0803 06/12/19 1039   06/10/19 1400  aztreonam (AZACTAM) 1 g in sodium chloride 0.9 % 100 mL IVPB  Status:  Discontinued     1 g 200 mL/hr over 30 Minutes Intravenous Every 12 hours 06/10/19 1317 06/10/19 1319   06/10/19 1400  aztreonam (AZACTAM) 0.5 g in dextrose 5 % 50 mL IVPB  Status:  Discontinued     0.5 g 100 mL/hr over 30 Minutes Intravenous Every 8 hours 06/10/19 1320 06/12/19 0803        Objective:   Vitals:   06/12/19 1400 06/12/19 1500 06/12/19 1551 06/12/19 1600  BP: (!) 72/38 (!) 72/49    Pulse: (!) 51 (!) 55 60   Resp: Temp:    (!) 97.4 F (36.3 C)  TempSrc:    Oral  SpO2: 94% 94% 92%   Weight:      Height:        Wt Readings from Last 3 Encounters:  06/11/19 81.2 kg  06/01/19 74.7 kg  05/19/19 95.6 kg     Intake/Output Summary (Last 24 hours) at 06/12/2019 1904 Last data filed at 06/12/2019 0919 Gross per 24 hour  Intake 100 ml  Output 175 ml  Net -75 ml   Physical Exam  Gen:-Awake, falls sleep easily but able to wake up and answer questions HEENT:- [place].AT, No sclera icterus Neck-Supple Neck, significant JVD elevation noted.  Lungs-diminished in bases without wheezing  CV- S1, S2 normal, irregular Abd-  +ve B.Sounds, Abd Soft, No tenderness,    Extremity/Skin:-  Trace edema, pedal pulses present , significant lower extremity varicosities Psych-less  lethargic, somewhat disoriented Neuro-generalized weakness, no new focal deficits,  GU-Foley in situ   Data Review:   Micro Results Recent Results (from the past 240 hour(s))  MRSA PCR Screening     Status: None   Collection Time: 06/10/19  5:03 AM   Specimen: Nasal Mucosa; Nasopharyngeal  Result Value Ref Range Status   MRSA by PCR NEGATIVE NEGATIVE Final    Comment:        The GeneXpert MRSA Assay (FDA approved for NASAL specimens only), is one component of a comprehensive MRSA colonization surveillance program. It is not intended to diagnose MRSA infection nor to guide or monitor treatment  for MRSA infections. Performed at [facility], [address], [place], [place] [address]   Culture, Urine     Status: Abnormal   Collection Time: 06/10/19  5:18 AM   Specimen: Urine, Clean Catch  Result Value Ref Range Status   Specimen Description URINE, CLEAN CATCH  Final   Special Requests   Final    NONE Performed at [facility], [address] [place], [place] [address]    Culture (A)  Final    >=100,000 COLONIES/mL ESCHERICHIA COLI >=100,000 COLONIES/mL KLEBSIELLA PNEUMONIAE    Report Status 06/12/2019 FINAL  Final   Organism ID, Bacteria ESCHERICHIA COLI (A)  Final   Organism ID, Bacteria KLEBSIELLA PNEUMONIAE (A)  Final      Susceptibility   Escherichia coli - MIC*    AMPICILLIN 4 SENSITIVE Sensitive     CEFAZOLIN <=4 SENSITIVE Sensitive     CEFTRIAXONE <=0.25 SENSITIVE Sensitive     CIPROFLOXACIN <=0.25 SENSITIVE Sensitive     GENTAMICIN <=1 SENSITIVE Sensitive     IMIPENEM <=0.25 SENSITIVE Sensitive     NITROFURANTOIN <=16 SENSITIVE Sensitive     TRIMETH/SULFA <=20 SENSITIVE Sensitive     AMPICILLIN/SULBACTAM <=2 SENSITIVE Sensitive     PIP/TAZO <=4 SENSITIVE Sensitive     * >=100,000 COLONIES/mL ESCHERICHIA COLI   Klebsiella pneumoniae - MIC*    AMPICILLIN >=32 RESISTANT Resistant     CEFAZOLIN <=4 SENSITIVE Sensitive     CEFTRIAXONE <=0.25 SENSITIVE  Sensitive     CIPROFLOXACIN <=0.25 SENSITIVE Sensitive     GENTAMICIN <=1 SENSITIVE Sensitive     IMIPENEM <=0.25 SENSITIVE Sensitive     NITROFURANTOIN 32 SENSITIVE Sensitive     TRIMETH/SULFA <=20 SENSITIVE Sensitive     AMPICILLIN/SULBACTAM 8 SENSITIVE Sensitive     PIP/TAZO <=4 SENSITIVE Sensitive     * >=100,000 COLONIES/mL KLEBSIELLA PNEUMONIAE    Radiology Reports CT HEAD WO CONTRAST  Result Date: 06/09/2019 CLINICAL DATA:  86 year old female with altered mental status common cephalopathy. EXAM: CT HEAD WITHOUT CONTRAST TECHNIQUE: Contiguous axial images were obtained from the base of the skull through the vertex without intravenous contrast. COMPARISON:  Brain MRI 02/25/2018. Head CT 05/24/2019. FINDINGS: Brain: Perivascular space at the inferior left basal ganglia again noted (normal variant). Cerebral volume is within normal limits for age. No midline shift, ventriculomegaly, mass effect, evidence of mass lesion, intracranial hemorrhage or evidence of cortically based acute infarction. Gray-white matter differentiation appears stable and within normal limits for age. Vascular: Calcified atherosclerosis at the skull base. No suspicious intracranial vascular hyperdensity. Skull: No acute osseous abnormality identified. Sinuses/Orbits: Visualized paranasal sinuses and mastoids are stable and well pneumatized. Other: No acute orbit or scalp soft tissue finding. IMPRESSION: Non contrast CT appearance of the brain remains stable since last month and within normal limits for age. Electronically Signed   By: Odessa Fleming M.D.   On: 06/09/2019 22:41   CT HEAD WO CONTRAST  Result Date: 05/24/2019 CLINICAL DATA:  86 year old female with altered mental status. Admitted with respiratory failure. EXAM: CT HEAD WITHOUT CONTRAST TECHNIQUE: Contiguous axial images were obtained from the base of the skull through the vertex without intravenous contrast. COMPARISON:  Brain MRI 02/25/2018. FINDINGS: Brain:  Cerebral volume is stable and within normal limits for age. There is a chronic perivascular space at the inferior left lentiform (normal variant). No midline shift, ventriculomegaly, mass effect, evidence of mass lesion, intracranial hemorrhage or evidence of cortically based acute infarction. Normal for age gray-white matter differentiation. Vascular: Calcified atherosclerosis  at the skull base. Skull: No acute osseous abnormality identified. Sinuses/Orbits: Visualized paranasal sinuses and mastoids are stable and well pneumatized. Other: No acute orbit or scalp soft tissue finding. Scalp vessels are prominent diffusely, although unchanged from the prior MRI. IMPRESSION: Normal for age non contrast CT appearance of the brain. Electronically Signed   By: Genevie Ann M.D.   On: 05/24/2019 18:19   US RENAL  Result Date: 06/10/2019 CLINICAL DATA:  Acute renal injury EXAM: RENAL / URINARY TRACT ULTRASOUND COMPLETE COMPARISON:  04/10/2017 FINDINGS: Right Kidney: Renal measurements: 12.5 x 4.4 x 5.0 cm. = volume: 145 mL. No mass lesion or hydronephrosis is noted. Mild increased echogenicity is seen. Left Kidney: Renal measurements: 12.2 x 5.7 x 6.0 cm = volume: 217 mL. Mild increased echogenicity is noted. Scattered cysts are noted throughout the left kidney. The largest of these measures 2.3 cm and is simple in nature. The cysts are all clustered in the midportion of the kidney. No obstructive changes are seen. Bladder: Decompressed Other: None. IMPRESSION: Increased echogenicity consistent with medical renal disease. Left renal cysts. Electronically Signed   By: Inez Catalina M.D.   On: 06/10/2019 15:20   DG CHEST PORT 1 VIEW  Result Date: 05/27/2019 CLINICAL DATA:  Bilateral pleural effusion and pericardial effusion, negative COVID-19 testing 8 days prior EXAM: PORTABLE CHEST 1 VIEW COMPARISON:  Radiograph 05/24/2019 FINDINGS: Diminishing lung volumes with persistent bilateral interstitial and airspace opacities,  slightly increasing in the right upper and lower lung though this may be attributable to the lower volumes. Stable cardiomegaly. Bilateral effusions. No pneumothorax. No acute osseous or soft tissue abnormality. Telemetry leads overlie the chest. IMPRESSION: Diminishing lung volumes with persistent bilateral interstitial and airspace opacities, slightly increasing in the right upper and lower lung though this may be attributable to lower volumes. Bilateral effusions. Electronically Signed   By: Lovena Le M.D.   On: 05/27/2019 05:58   DG CHEST PORT 1 VIEW  Result Date: 05/24/2019 CLINICAL DATA:  Acute on chronic respiratory failure. EXAM: PORTABLE CHEST 1 VIEW COMPARISON:  05/19/2019 FINDINGS: Patient is rotated to the right. There is mild motion artifact. Lungs are adequately inflated with interval worsening bibasilar opacification likely moderate bilateral effusions with associated bibasilar atelectasis. Infection in the mid to lower lungs is possible. Stable cardiomegaly with mild prominence of the perihilar markings suggesting mild vascular congestion. Remainder the exam is unchanged. IMPRESSION: Mild cardiomegaly with mild vascular congestion. Worsening bilateral moderate pleural effusions likely with associated bibasilar atelectasis. Infection in the lung bases is possible. Electronically Signed   By: Marin Olp M.D.   On: 05/24/2019 10:40   DG Chest Port 1 View  Result Date: 05/19/2019 CLINICAL DATA:  Decreased oxygen saturation today. Lower extremity swelling. EXAM: PORTABLE CHEST 1 VIEW COMPARISON:  PA and lateral chest 05/28/2017. FINDINGS: There is massive cardiomegaly. Small to moderate bilateral pleural effusions are seen, larger on the right. Pulmonary edema and basilar atelectasis are seen. No pneumothorax. No acute bony abnormality. Avascular necrosis of the humeral heads is noted. IMPRESSION: Cardiomegaly with interstitial pulmonary edema and small to moderate pleural effusions, larger  on the right. Electronically Signed   By: Inge Rise M.D.   On: 05/19/2019 12:25   ECHOCARDIOGRAM COMPLETE  Result Date: 05/20/2019    ECHOCARDIOGRAM REPORT   Patient Name:   SURI TAFOLLA Date of Exam: 05/20/2019 Medical Rec #:  [id number]      Height:       64.0 in Accession #:  [id number]     Weight:       208.8 lb Date of Birth:  21-Dec-1932      BSA:          1.99 m Patient Age:    86 years       BP:           100/52 mmHg Patient Gender: F              HR:           73 bpm. [facility] Procedure: 2D Echo Indications:    CHF-Acute Diastolic 428.31 / I50.31  History:        Patient has prior history of Echocardiogram examinations, most                 recent 03/16/2015. Arrythmias:Atrial Fibrillation; Risk                 Factors:Dyslipidemia, Hypertension and Non-Smoker. Acute                 hypoxemic respiratory failure , First degree atrioventricular                 block, GERD, Non-rheumatic mitral regurgitation.  Sonographer:    Jeryl Columbia RDCS (AE) Referring Phys: [id number] [facility] IMPRESSIONS  1. Left ventricular ejection fraction, by estimation, is 60 to 65%. The left ventricle has normal function. The left ventricle has no regional wall motion abnormalities. There is mild concentric left ventricular hypertrophy. Left ventricular diastolic parameters are indeterminate. Elevated left ventricular end-diastolic pressure.  2. Right ventricular systolic function is normal. The right ventricular size is mildly enlarged. There is severely elevated pulmonary artery systolic pressure.  3. Left atrial size was severely dilated.  4. Right atrial size was severely dilated.  5. IVC collapses. No RA or RV collapse. No signs of tamponade. Large pericardial effusion. The pericardial effusion is circumferential.  6. The mitral valve is grossly normal. Mild mitral valve regurgitation.  7. Tricuspid valve regurgitation is moderate.  8. The aortic valve is tricuspid. Aortic valve  regurgitation is mild. Mild aortic valve stenosis. Aortic valve area, by VTI measures 1.57 cm. Aortic valve mean gradient measures 17.0 mmHg. Aortic valve Vmax measures 2.79 m/s.  9. The inferior vena cava is dilated in size with >50% respiratory variability, suggesting right atrial pressure of 8 mmHg. FINDINGS  Left Ventricle: Left ventricular ejection fraction, by estimation, is 60 to 65%. The left ventricle has normal function. The left ventricle has no regional wall motion abnormalities. The left ventricular internal cavity size was normal in size. There is  mild concentric left ventricular hypertrophy. Left ventricular diastolic parameters are indeterminate. Elevated left ventricular end-diastolic pressure. Right Ventricle: The right ventricular size is mildly enlarged. No increase in right ventricular wall thickness. Right ventricular systolic function is normal. There is severely elevated pulmonary artery systolic pressure. The tricuspid regurgitant velocity is 4.02 m/s, and with an assumed right atrial pressure of 10 mmHg, the estimated right ventricular systolic pressure is 74.6 mmHg. Left Atrium: Left atrial size was severely dilated. Right Atrium: Right atrial size was severely dilated. Pericardium: IVC collapses. No RA or RV collapse. No signs of tamponade. A large pericardial effusion is present. The pericardial effusion is circumferential. Mitral Valve: The mitral valve is grossly normal. Mild mitral valve regurgitation. Tricuspid Valve: The tricuspid valve is grossly normal. Tricuspid valve regurgitation is moderate. Aortic Valve: The aortic valve is tricuspid. Aortic valve regurgitation is mild.  Aortic regurgitation PHT measures 540 msec. Mild aortic stenosis is present. Moderate aortic valve annular calcification. There is mild calcification of the aortic valve. Aortic valve mean gradient measures 17.0 mmHg. Aortic valve peak gradient measures 31.1 mmHg. Aortic valve area, by VTI measures 1.57 cm.  Pulmonic Valve: The pulmonic valve was grossly normal. Pulmonic valve regurgitation is not visualized. Aorta: The aortic root is normal in size and structure. Venous: The inferior vena cava is dilated in size with greater than 50% respiratory variability, suggesting right atrial pressure of 8 mmHg. IAS/Shunts: No atrial level shunt detected by color flow Doppler.  LEFT VENTRICLE PLAX 2D LVIDd:         5.12 cm  Diastology LVIDs:         3.07 cm  LV e' lateral:   8.81 cm/s LV PW:         1.34 cm  LV E/e' lateral: 15.3 LV IVS:        1.15 cm  LV e' medial:    6.53 cm/s LVOT diam:     1.80 cm  LV E/e' medial:  20.7 LV SV:         91.86 ml LV SV Index:   41.66 LVOT Area:     2.54 cm  LEFT ATRIUM              Index       RIGHT ATRIUM           Index LA diam:        4.90 cm  2.46 cm/m  RA Area:     38.50 cm LA Vol (A2C):   186.0 ml 93.35 ml/m RA Volume:   166.00 ml 83.32 ml/m LA Vol (A4C):   160.0 ml 80.30 ml/m LA Biplane Vol: 173.0 ml 86.83 ml/m  AORTIC VALVE AV Area (Vmax):    1.62 cm AV Area (Vmean):   1.49 cm AV Area (VTI):     1.57 cm AV Vmax:           278.67 cm/s AV Vmean:          192.333 cm/s AV VTI:            0.584 m AV Peak Grad:      31.1 mmHg AV Mean Grad:      17.0 mmHg LVOT Vmax:         177.33 cm/s LVOT Vmean:        112.667 cm/s LVOT VTI:          0.361 m LVOT/AV VTI ratio: 0.62 AI PHT:            540 msec  AORTA Ao Root diam: 2.80 cm MITRAL VALVE                TRICUSPID VALVE MV Area (PHT): 2.42 cm     TR Peak grad:   64.6 mmHg MV Decel Time: 313 msec     TR Vmax:        402.00 cm/s MR Peak grad: 63.0 mmHg MR Mean grad: 45.0 mmHg     SHUNTS MR Vmax:      397.00 cm/s   Systemic VTI:  0.36 m MR Vmean:     320.0 cm/s    Systemic Diam: 1.80 cm MV E velocity: 135.00 cm/s MV A velocity: 39.40 cm/s MV E/A ratio:  3.43 Prentice DockerSuresh Koneswaran MD Electronically signed by Prentice DockerSuresh Koneswaran MD Signature Date/Time: 05/20/2019/2:55:41 PM    Final    ECHOCARDIOGRAM LIMITED  Result  Date: 05/23/2019     ECHOCARDIOGRAM LIMITED REPORT   Patient Name:   RAELLE CHAMBERS Date of Exam: 05/23/2019 Medical Rec #:  [id number]      Height:       64.0 in Accession #:    [id number]     Weight:       195.1 lb Date of Birth:  03-09-1933      BSA:          1.936 m Patient Age:    86 years       BP:           129/70 mmHg Patient Gender: F              HR:           78 bpm. [facility] Procedure: Limited Echo and Cardiac Doppler Indications:    Pericardial Effusion 423.9 / l31.3  History:        Patient has prior history of Echocardiogram examinations, most                 recent 05/20/2019. CHF, Arrythmias:Atrial Fibrillation; Risk                 Factors:Hypertension and Dyslipidemia. Non-Smoker. Acute                 hypoxemic respiratory failure , First degree atrioventricular                 block, GERD, Non-rheumatic mitral regurgitation.  Sonographer:    Celesta Gentile RCS Referring Phys: [phone] [facility] IMPRESSIONS  1. Limited study.  2. Large pericardial effusion, circumferential but to greater degree posterior and lateral. Dimensions are similar to previous study on February 17. No obvious significant respiratory variation in mitral outflow to suggest tamponade physiology. There is  no compression of RA or RV either, but evidence of significant pulmonary hypertension (last study RVSP was approximately 75 mmHg) and this could be inhibiting compression of RV chamber. Organizing material adherent to pericardium suggests possible chronicity, particularly given effusion size.  3. Left ventricular ejection fraction, by estimation, is 60 to 65%. The left ventricle has normal function. The left ventricle has no regional wall motion abnormalities. There is the interventricular septum is flattened in systole and diastole, consistent with right ventricular pressure and volume overload.  4. The right ventricular size is moderately enlarged.  5. Left atrial size was severely dilated.  6. Right atrial size was  severely dilated.  7. The mitral valve is grossly normal. FINDINGS  Left Ventricle: Left ventricular ejection fraction, by estimation, is 60 to 65%. The left ventricle has normal function. The left ventricle has no regional wall motion abnormalities. The interventricular septum is flattened in systole and diastole, consistent with right ventricular pressure and volume overload. Right Ventricle: The right ventricular size is moderately enlarged. Left Atrium: Left atrial size was severely dilated. Right Atrium: Right atrial size was severely dilated. Pericardium: Dimensions are similar to previous study on February 17. No obvious significant respiratory variation in mitral outflow to suggest tamponade physiology. There is no compression of RA or RV either, but evidence of significant pulmonary hypertension (last study RVSP was approximately 75 mmHg) and this could be inhibiting compression of RV chamber. Organizing material adherent to pericardium suggests possible chronicity, particularly given effusion size. A large pericardial effusion is present. The pericardial effusion is circumferential. Mitral Valve: The mitral valve is grossly normal. Mild to moderate mitral annular calcification. Additional  Comments: There is pleural effusion in the left lateral region. Nona Dell MD Electronically signed by Nona Dell MD Signature Date/Time: 05/23/2019/2:09:38 PM    Final    DG ESOPHAGUS W SINGLE CM (SOL OR THIN BA)  Result Date: 05/22/2019 CLINICAL DATA:  DYSPHAGIA. EXAM: ESOPHOGRAM/BARIUM SWALLOW TECHNIQUE: Single contrast examination was performed using thin barium or water soluble. FLUOROSCOPY TIME:  Fluoroscopy Time: 2 minutes 18 seconds Radiation Exposure Index (if provided by the fluoroscopic device): 38.6 mGy COMPARISON:  None. FINDINGS: The patient ingested thin barium in a semi upright position. The patient has poor esophageal peristalsis with persistent spasm in the distal esophagus consistent with  achalasia. I placed the patient in an almost upright position and only a small amount of barium trickled into the nondistended stomach. IMPRESSION: Achalasia. Minimal emptying of the dilated esophagus. Minimal peristalsis in the esophagus. The patient tolerated the procedure well. No immediate complications. Electronically Signed   By: Francene Boyers M.D.   On: 05/22/2019 11:18    CBC Recent Labs  Lab 06/09/19 1740 06/10/19 0334 06/11/19 0923  WBC 5.3 4.8 3.5*  HGB 11.5* 10.4* 10.2*  HCT 36.1 33.3* 33.1*  PLT 169 156 130*  MCV 97.6 99.7 100.3*  MCH 31.1 31.1 30.9  MCHC 31.9 31.2 30.8  RDW 15.6* 15.3 15.2  LYMPHSABS 0.7  --   --   MONOABS 0.7  --   --   EOSABS 0.1  --   --   BASOSABS 0.0  --   --     Chemistries  Recent Labs  Lab 06/09/19 1740 06/09/19 2318 06/10/19 0334 06/10/19 1310 06/10/19 1831 06/11/19 0923 06/12/19 0919  NA 128*   < > 132* 133* 132* 135 136  K 7.2*   < > 6.7* 6.1* 6.1* 5.5* 4.7  CL 84*   < > 89* 91* 89* 90* 89*  CO2 34*   < > 35* 33* 33* 34* 34*  GLUCOSE 101*   < > 86 85 90 93 94  BUN 112*   < > 105* 113* 112* 116* 118*  CREATININE 6.12*   < > 5.96* 5.86* 5.89* 5.75* 5.62*  CALCIUM 8.8*   < > 8.5* 8.9 8.8* 8.8* 8.8*  MG 2.9*  --   --   --   --   --   --   AST 32  --   --   --   --  22  --   ALT 21  --   --   --   --  17  --   ALKPHOS 135*  --   --   --   --  111  --   BILITOT 1.2  --   --   --   --  1.3*  --    < > = values in this interval not displayed.   ------------------------------------------------------------------------------------------------------------------ No results for input(s): CHOL, HDL, LDLCALC, TRIG, CHOLHDL, LDLDIRECT in the last 72 hours.  Lab Results  Component Value Date   HGBA1C 6.2 06/16/2016   ------------------------------------------------------------------------------------------------------------------ No results for input(s): TSH, T4TOTAL, T3FREE, THYROIDAB in the last 72 hours.  Invalid input(s):  FREET3 ------------------------------------------------------------------------------------------------------------------ No results for input(s): VITAMINB12, FOLATE, FERRITIN, TIBC, IRON, RETICCTPCT in the last 72 hours.  Coagulation profile No results for input(s): INR, PROTIME in the last 168 hours.  No results for input(s): DDIMER in the last 72 hours.  Cardiac Enzymes No results for input(s): CKMB, TROPONINI, MYOGLOBIN in the last 168 hours.  Invalid input(s): CK ------------------------------------------------------------------------------------------------------------------  Component Value Date/Time   BNP 359.0 (H) 05/19/2019 1200    Shon Hale M.D on 06/12/2019 at 7:04 PM  Go to [email or web address] - for contact info  [facility] - Office  [phone]

## 2019-06-12 NOTE — Progress Notes (Signed)
  Patient son is at bedside, we discussed plan of care, goals of care and palliative care questions - patient's son requested that we continue status quo -No de-escalation, and no escalation at this time -Continue to allow persistent hypotension without pressors or aggressive IV fluid or intervention -Patient remains a DO NOT RESUSCITATE -Patient son hoping to make final decisions regarding any change in status soon  Shon Hale, MD

## 2019-06-12 NOTE — Care Management Important Message (Signed)
Important Message  Patient Details  Name: [name] MRN: [id number] Date of Birth: 10-27-1932   Medicare Important Message Given:  Yes     Corey Harold 06/12/2019, 2:52 PM

## 2019-06-12 NOTE — Progress Notes (Addendum)
[facility] Progress Note    Assessment/ Plan:   1.  AKI on CKD 3: sig AKI from 1.2 (3/1) to 6.12 in a matter of days.  Some hypotension in ED and in ICU.  Looks like hemodynamic issues causing ATN.  No hydro on 3/10 renal US. Strict I and O. No HD per son in [place] discussion.  Greatly appreciate palliative care.  Labs pending for this AM  2.  Hyperkalemia: up to 7.2--> 5.5 s/p aggressive management-- > calcium gluconate, lokelma, albuterol  3.  Acute metabolic encephalopathy: CT head negative, appears to be multifactorial with UTI, gabapentin, azotemia/ uremia.  Improving  4.  UTI: urine looks dirty, multiple allergies, would avoid quinolones with hyperK and AMS, initially started on aztreonam urine culture showing E coli and Klebsiella--> switched to Unasyn 3/12 as she has tolerated Augmentin in the past   5.  Hypertension: hold lisinopril--> she is hypotensive here--> sepsis + pericardial effusion?  No pressors per son  6.  Large pericardial effusion: consider repeat TTE +/- cardiology c/s to assess for tamponade--> she has very sig JVD and hypotension--> if no aggressive measures being pursued don't think that would change our management much  7.  Dispo:  ICU  Subjective:    Much better today- awake and alert, feeding herself breakfast.  BP is still low and labs are pending for this AM.  About 600 mL UOP daily   Objective:   BP (!) 81/43   Pulse (!) 52   Temp (!) 97.3 F (36.3 C) (Oral)   Resp 15   Ht 5\' 2"  (1.575 m)   Wt 81.2 kg   SpO2 94%   BMI 32.74 kg/m   Intake/Output Summary (Last 24 hours) at 06/12/2019 1003 Last data filed at 06/12/2019 0919 Gross per 24 hour  Intake 245.97 ml  Output 600 ml  Net -354.03 ml   Weight change:   Physical Exam: GEN ill-appearing, sitting up in bed awake HEENT eyes closed NECK JVD up to angle of mandible on both sides even sitting up PULM clear anteriorly, dim posteriorly CV RRR II/VI systolic murmur ABD  soft, no more suprapubic tenderness EXT 1+ LE edema with chronic venous stasis changes, warm NEURO alert this AM SKIN warm and dry  Imaging: US RENAL  Result Date: 06/10/2019 CLINICAL DATA:  Acute renal injury EXAM: RENAL / URINARY TRACT ULTRASOUND COMPLETE COMPARISON:  04/10/2017 FINDINGS: Right Kidney: Renal measurements: 12.5 x 4.4 x 5.0 cm. = volume: 145 mL. No mass lesion or hydronephrosis is noted. Mild increased echogenicity is seen. Left Kidney: Renal measurements: 12.2 x 5.7 x 6.0 cm = volume: 217 mL. Mild increased echogenicity is noted. Scattered cysts are noted throughout the left kidney. The largest of these measures 2.3 cm and is simple in nature. The cysts are all clustered in the midportion of the kidney. No obstructive changes are seen. Bladder: Decompressed Other: None. IMPRESSION: Increased echogenicity consistent with medical renal disease. Left renal cysts. Electronically Signed   By: Inez Catalina M.D.   On: 06/10/2019 15:20    Labs: BMET Recent Labs  Lab 06/09/19 1740 06/09/19 2318 06/10/19 0334 06/10/19 1310 06/10/19 1831 06/11/19 0923 06/12/19 0919  NA 128* 132* 132* 133* 132* 135 136  K 7.2* 6.5* 6.7* 6.1* 6.1* 5.5* 4.7  CL 84* 88* 89* 91* 89* 90* 89*  CO2 34* 35* 35* 33* 33* 34* 34*  GLUCOSE 101* 55* 86 85 90 93 94  BUN 112* 111* 105* 113* 112* 116*  PENDING  CREATININE 6.12* 6.19* 5.96* 5.86* 5.89* 5.75* 5.62*  CALCIUM 8.8* 8.6* 8.5* 8.9 8.8* 8.8* 8.8*  PHOS  --   --   --  5.4* 6.0*  --  6.3*   CBC Recent Labs  Lab 06/09/19 1740 06/10/19 0334 06/11/19 0923  WBC 5.3 4.8 3.5*  NEUTROABS 3.9  --   --   HGB 11.5* 10.4* 10.2*  HCT 36.1 33.3* 33.1*  MCV 97.6 99.7 100.3*  PLT 169 156 130*    Medications:    . Chlorhexidine Gluconate Cloth  6 each Topical Daily  . heparin  5,000 Units Subcutaneous Q8H  . mouth rinse  15 mL Mouth Rinse BID      Bufford Buttner, MD 06/12/2019, 10:03 AM

## 2019-06-13 LAB — GLUCOSE, CAPILLARY
Glucose-Capillary: 85 mg/dL (ref 70–99)
Glucose-Capillary: 102 mg/dL — ABNORMAL HIGH (ref 70–99)
Glucose-Capillary: 111 mg/dL — ABNORMAL HIGH (ref 70–99)
Glucose-Capillary: 152 mg/dL — ABNORMAL HIGH (ref 70–99)
Glucose-Capillary: 103 mg/dL — ABNORMAL HIGH (ref 70–99)
Glucose-Capillary: 116 mg/dL — ABNORMAL HIGH (ref 70–99)

## 2019-06-13 LAB — CBC
HCT: 33.4 % — ABNORMAL LOW (ref 36.0–46.0)
Hemoglobin: 10.2 g/dL — ABNORMAL LOW (ref 12.0–15.0)
MCH: 30.3 pg (ref 26.0–34.0)
MCHC: 30.5 g/dL (ref 30.0–36.0)
MCV: 99.1 fL (ref 80.0–100.0)
Platelets: 107 10*3/uL — ABNORMAL LOW (ref 150–400)
RBC: 3.37 MIL/uL — ABNORMAL LOW (ref 3.87–5.11)
RDW: 15 % (ref 11.5–15.5)
WBC: 3.4 10*3/uL — ABNORMAL LOW (ref 4.0–10.5)
nRBC: 0 % (ref 0.0–0.2)

## 2019-06-13 LAB — RENAL FUNCTION PANEL
Albumin: 2.7 g/dL — ABNORMAL LOW (ref 3.5–5.0)
Anion gap: 11 (ref 5–15)
BUN: 121 mg/dL — ABNORMAL HIGH (ref 8–23)
CO2: 34 mmol/L — ABNORMAL HIGH (ref 22–32)
Calcium: 8.6 mg/dL — ABNORMAL LOW (ref 8.9–10.3)
Chloride: 89 mmol/L — ABNORMAL LOW (ref 98–111)
Creatinine, Ser: 5.56 mg/dL — ABNORMAL HIGH (ref 0.44–1.00)
GFR calc Af Amer: 7 mL/min — ABNORMAL LOW (ref >60)
GFR calc non Af Amer: 6 mL/min — ABNORMAL LOW (ref >60)
Glucose, Bld: 105 mg/dL — ABNORMAL HIGH (ref 70–99)
Phosphorus: 5.7 mg/dL — ABNORMAL HIGH (ref 2.5–4.6)
Potassium: 4.4 mmol/L (ref 3.5–5.1)
Sodium: 134 mmol/L — ABNORMAL LOW (ref 135–145)

## 2019-06-13 MED ORDER — PROMETHAZINE HCL 25 MG RE SUPP: 25.0000 mg | Freq: Once | RECTAL | Status: DC

## 2019-06-13 MED ORDER — ONDANSETRON HCL 4 MG/2ML IJ SOLN
4.0000 mg | Freq: Four times a day (QID) | INTRAMUSCULAR | Status: DC | PRN
  Administered 2019-06-13 – 2019-06-15 (×2): 4 mg via INTRAVENOUS
  Filled 2019-06-13 (×2): qty 2

## 2019-06-13 NOTE — Progress Notes (Signed)
Patient Demographics:    [name], is a 86 y.o. female, DOB - 06-10-1932, RUE:[id number]  Admit date - 06/09/2019   Admitting Physician Ejiroghene Wendall Stade, MD  Outpatient Primary MD for the patient is Raliegh Ip, DO  LOS - 4   Chief Complaint  Patient presents with  . Altered Mental Status  . hyperkalemia        Subjective:    [name] today has no fevers, no emesis,  No chest pain, resting comfortably  --Wakes up easily for meals, then falls asleep again for long periods of time -Blood pressure remains soft  Assessment  & Plan :    Principal Problem:   Acute metabolic encephalopathy Active Problems:   Goals of care, counseling/discussion   Palliative care encounter   AF (atrial fibrillation) (HCC)   Hyperkalemia   Chronic diastolic CHF (congestive heart failure) (HCC)   Acute kidney injury superimposed on CKD (HCC)   Acute renal failure (HCC)   Encounter for hospice care discussion  Brief Summary:- 86 y.o. female with medical history significant for atrial fibrillation, hypertension, congestive heart failure.  Admitted on 06/09/2019 with acute metabolic encephalopathy in the setting of renal failure with presumed uremia/azotemia and UTI    A/p 1)Social/Ethics-extensive conversation with the patient's son about goals of care, patient remaines hemodynamically unstable with low BP, patient remains encephalopathic -Patient son states that patient would not want aggressive interventions -Specifically no hemodialysis, no pressors, -Patient son requested that we do not escalate care at this time, patient son requests status quo for now.  He wants to see how his mom does over the next day or 2 prior to making further decisions -Patient remains a DNR/DNI --ABG with mostly "compensated" Hypercarpnia  2)Aki on CKD IIIb with Persistent Hyperkalemia--- potassium down to 4.7 from a  peak of 7.2 after hyperkalemia cocktail and Lokelma x3 doses -Baseline creatinine 1.2 just over a week PTA -Creatinine still above 5 -Discussed with Nephrology service patient appears somewhat volume overloaded, advised no further IV fluids -Renal ultrasound without obstructive uropathy -Keep Foley in for accurate ins and O -Patient son declines HD -Lisinopril discontinued  3)Acute Metabolic Encephalopathy--more awake, more interactive, overall still lethargic for long peers of time suspect uremia/azotemia related, gram-negative rod UTI is probably contributory as well ABG with mostly compensated hypercapnia, CT head without acute findings  4) E. coli and Klebsiella UTI---c/n  Unasyn (started 06/12/19) per sensitivity report, stopped  aztreonampatient with allergies to sulfa, cephalosporins and doxycycline  HTN--- c/n Hold Lisinopril due to hypotension and hyperkalemia  4)Large Pericardial Effusion--this is probably contributing to patient's hypotension  5)Permanent Atrial Fibrillation--- Eliquis on hold, patient is actually bradycardic without any AV nodal blocking agents  6)HFpEF/chronic diastolic dysfunction CHF----Lasix on hold due to low blood pressure and kidney concerns.  -Clinically somewhat volume overloaded but BP too low to allow for diuresis   Disposition/Need for in-Hospital Stay- patient unable to be discharged at this time due to --- worsening renal function, and IV antibiotics for E. coli  and Klebsiella UTI -Not medically ready for discharge, remain in ICU for now -Anticipate transfer to SNF on 06/15/2019 with palliative care services  Code Status : Full code  Family Communication:  Discussed with son  Consults  : Cardiology, palliative care and nephrology  DVT Prophylaxis  :    - Heparin - SCDs   Lab Results  Component Value Date   PLT 107 (L) 06/13/2019    Inpatient Medications  Scheduled Meds: . Chlorhexidine Gluconate Cloth  6 each Topical Daily  .  heparin  5,000 Units Subcutaneous Q8H  . mouth rinse  15 mL Mouth Rinse BID  . promethazine  25 mg Rectal Once   Continuous Infusions: . ampicillin-sulbactam (UNASYN) IV 3 g (06/13/19 0757)   PRN Meds:.    Anti-infectives (From admission, onward)   Start     Dose/Rate Route Frequency Ordered Stop   06/13/19 0900  Ampicillin-Sulbactam (UNASYN) 3 g in sodium chloride 0.9 % 100 mL IVPB     3 g 200 mL/hr over 30 Minutes Intravenous Every 24 hours 06/12/19 0900     06/12/19 0900  Ampicillin-Sulbactam (UNASYN) 3 g in sodium chloride 0.9 % 100 mL IVPB     3 g 200 mL/hr over 30 Minutes Intravenous  Once 06/12/19 0803 06/12/19 1039   06/10/19 1400  aztreonam (AZACTAM) 1 g in sodium chloride 0.9 % 100 mL IVPB  Status:  Discontinued     1 g 200 mL/hr over 30 Minutes Intravenous Every 12 hours 06/10/19 1317 06/10/19 1319   06/10/19 1400  aztreonam (AZACTAM) 0.5 g in dextrose 5 % 50 mL IVPB  Status:  Discontinued     0.5 g 100 mL/hr over 30 Minutes Intravenous Every 8 hours 06/10/19 1320 06/12/19 0803        Objective:   Vitals:   06/13/19 1030 06/13/19 1100 06/13/19 1118 06/13/19 1130  BP:  (!) 74/41 (!) 66/39 (!) 71/38  Pulse: (!) 50 (!) 53 (!) 54 (!) 50  Resp: Temp:      TempSrc:      SpO2: 94% 98% 98% 97%  Weight:      Height:        Wt Readings from Last 3 Encounters:  06/11/19 81.2 kg  06/01/19 74.7 kg  05/19/19 95.6 kg     Intake/Output Summary (Last 24 hours) at 06/13/2019 1135 Last data filed at 06/13/2019 0854 Gross per 24 hour  Intake 318 ml  Output 575 ml  Net -257 ml   Physical Exam  Gen:-Awake, falls sleep easily but able to wake up and answer questions HEENT:- [place].AT, No sclera icterus Neck-Supple Neck, significant JVD elevation noted.  Lungs-diminished in bases without wheezing  CV- S1, S2 normal, irregular Abd-  +ve B.Sounds, Abd Soft, No tenderness,    Extremity/Skin:-  Trace edema, pedal pulses present , significant lower extremity  varicosities Psych-less lethargic, somewhat disoriented Neuro-generalized weakness, no new focal deficits,  GU-Foley in situ   Data Review:   Micro Results Recent Results (from the past 240 hour(s))  MRSA PCR Screening     Status: None   Collection Time: 06/10/19  5:03 AM   Specimen: Nasal Mucosa; Nasopharyngeal  Result Value Ref Range Status   MRSA by PCR NEGATIVE NEGATIVE Final    Comment:        The GeneXpert MRSA Assay (FDA approved for NASAL specimens only), is one component of a comprehensive MRSA colonization surveillance program. It is not intended to diagnose MRSA  infection nor to guide or monitor treatment for MRSA infections. Performed at [facility] [address] [place], [place] [address]   Culture, Urine     Status: Abnormal   Collection Time: 06/10/19  5:18 AM   Specimen: Urine, Clean Catch  Result Value Ref Range Status   Specimen Description URINE, CLEAN CATCH  Final   Special Requests   Final    NONE Performed at [facility], [address], [place], [place] [address]    Culture (A)  Final    >=100,000 COLONIES/mL ESCHERICHIA COLI >=100,000 COLONIES/mL KLEBSIELLA PNEUMONIAE    Report Status 06/12/2019 FINAL  Final   Organism ID, Bacteria ESCHERICHIA COLI (A)  Final   Organism ID, Bacteria KLEBSIELLA PNEUMONIAE (A)  Final      Susceptibility   Escherichia coli - MIC*    AMPICILLIN 4 SENSITIVE Sensitive     CEFAZOLIN <=4 SENSITIVE Sensitive     CEFTRIAXONE <=0.25 SENSITIVE Sensitive     CIPROFLOXACIN <=0.25 SENSITIVE Sensitive     GENTAMICIN <=1 SENSITIVE Sensitive     IMIPENEM <=0.25 SENSITIVE Sensitive     NITROFURANTOIN <=16 SENSITIVE Sensitive     TRIMETH/SULFA <=20 SENSITIVE Sensitive     AMPICILLIN/SULBACTAM <=2 SENSITIVE Sensitive     PIP/TAZO <=4 SENSITIVE Sensitive     * >=100,000 COLONIES/mL ESCHERICHIA COLI   Klebsiella pneumoniae - MIC*    AMPICILLIN >=32 RESISTANT Resistant     CEFAZOLIN <=4 SENSITIVE Sensitive     CEFTRIAXONE  <=0.25 SENSITIVE Sensitive     CIPROFLOXACIN <=0.25 SENSITIVE Sensitive     GENTAMICIN <=1 SENSITIVE Sensitive     IMIPENEM <=0.25 SENSITIVE Sensitive     NITROFURANTOIN 32 SENSITIVE Sensitive     TRIMETH/SULFA <=20 SENSITIVE Sensitive     AMPICILLIN/SULBACTAM 8 SENSITIVE Sensitive     PIP/TAZO <=4 SENSITIVE Sensitive     * >=100,000 COLONIES/mL KLEBSIELLA PNEUMONIAE    Radiology Reports CT HEAD WO CONTRAST  Result Date: 06/09/2019 CLINICAL DATA:  87 year old female with altered mental status common cephalopathy. EXAM: CT HEAD WITHOUT CONTRAST TECHNIQUE: Contiguous axial images were obtained from the base of the skull through the vertex without intravenous contrast. COMPARISON:  Brain MRI 02/25/2018. Head CT 05/24/2019. FINDINGS: Brain: Perivascular space at the inferior left basal ganglia again noted (normal variant). Cerebral volume is within normal limits for age. No midline shift, ventriculomegaly, mass effect, evidence of mass lesion, intracranial hemorrhage or evidence of cortically based acute infarction. Gray-white matter differentiation appears stable and within normal limits for age. Vascular: Calcified atherosclerosis at the skull base. No suspicious intracranial vascular hyperdensity. Skull: No acute osseous abnormality identified. Sinuses/Orbits: Visualized paranasal sinuses and mastoids are stable and well pneumatized. Other: No acute orbit or scalp soft tissue finding. IMPRESSION: Non contrast CT appearance of the brain remains stable since last month and within normal limits for age. Electronically Signed   By: Odessa Fleming M.D.   On: 06/09/2019 22:41   CT HEAD WO CONTRAST  Result Date: 05/24/2019 CLINICAL DATA:  86 year old female with altered mental status. Admitted with respiratory failure. EXAM: CT HEAD WITHOUT CONTRAST TECHNIQUE: Contiguous axial images were obtained from the base of the skull through the vertex without intravenous contrast. COMPARISON:  Brain MRI 02/25/2018.  FINDINGS: Brain: Cerebral volume is stable and within normal limits for age. There is a chronic perivascular space at the inferior left lentiform (normal variant). No midline shift, ventriculomegaly, mass effect, evidence of mass lesion, intracranial hemorrhage or evidence of cortically based acute infarction. Normal for  age gray-white matter differentiation. Vascular: Calcified atherosclerosis at the skull base. Skull: No acute osseous abnormality identified. Sinuses/Orbits: Visualized paranasal sinuses and mastoids are stable and well pneumatized. Other: No acute orbit or scalp soft tissue finding. Scalp vessels are prominent diffusely, although unchanged from the prior MRI. IMPRESSION: Normal for age non contrast CT appearance of the brain. Electronically Signed   By: Genevie Ann M.D.   On: 05/24/2019 18:19   US RENAL  Result Date: 06/10/2019 CLINICAL DATA:  Acute renal injury EXAM: RENAL / URINARY TRACT ULTRASOUND COMPLETE COMPARISON:  04/10/2017 FINDINGS: Right Kidney: Renal measurements: 12.5 x 4.4 x 5.0 cm. = volume: 145 mL. No mass lesion or hydronephrosis is noted. Mild increased echogenicity is seen. Left Kidney: Renal measurements: 12.2 x 5.7 x 6.0 cm = volume: 217 mL. Mild increased echogenicity is noted. Scattered cysts are noted throughout the left kidney. The largest of these measures 2.3 cm and is simple in nature. The cysts are all clustered in the midportion of the kidney. No obstructive changes are seen. Bladder: Decompressed Other: None. IMPRESSION: Increased echogenicity consistent with medical renal disease. Left renal cysts. Electronically Signed   By: Inez Catalina M.D.   On: 06/10/2019 15:20   DG CHEST PORT 1 VIEW  Result Date: 05/27/2019 CLINICAL DATA:  Bilateral pleural effusion and pericardial effusion, negative COVID-19 testing 8 days prior EXAM: PORTABLE CHEST 1 VIEW COMPARISON:  Radiograph 05/24/2019 FINDINGS: Diminishing lung volumes with persistent bilateral interstitial and  airspace opacities, slightly increasing in the right upper and lower lung though this may be attributable to the lower volumes. Stable cardiomegaly. Bilateral effusions. No pneumothorax. No acute osseous or soft tissue abnormality. Telemetry leads overlie the chest. IMPRESSION: Diminishing lung volumes with persistent bilateral interstitial and airspace opacities, slightly increasing in the right upper and lower lung though this may be attributable to lower volumes. Bilateral effusions. Electronically Signed   By: Lovena Le M.D.   On: 05/27/2019 05:58   DG CHEST PORT 1 VIEW  Result Date: 05/24/2019 CLINICAL DATA:  Acute on chronic respiratory failure. EXAM: PORTABLE CHEST 1 VIEW COMPARISON:  05/19/2019 FINDINGS: Patient is rotated to the right. There is mild motion artifact. Lungs are adequately inflated with interval worsening bibasilar opacification likely moderate bilateral effusions with associated bibasilar atelectasis. Infection in the mid to lower lungs is possible. Stable cardiomegaly with mild prominence of the perihilar markings suggesting mild vascular congestion. Remainder the exam is unchanged. IMPRESSION: Mild cardiomegaly with mild vascular congestion. Worsening bilateral moderate pleural effusions likely with associated bibasilar atelectasis. Infection in the lung bases is possible. Electronically Signed   By: Marin Olp M.D.   On: 05/24/2019 10:40   DG Chest Port 1 View  Result Date: 05/19/2019 CLINICAL DATA:  Decreased oxygen saturation today. Lower extremity swelling. EXAM: PORTABLE CHEST 1 VIEW COMPARISON:  PA and lateral chest 05/28/2017. FINDINGS: There is massive cardiomegaly. Small to moderate bilateral pleural effusions are seen, larger on the right. Pulmonary edema and basilar atelectasis are seen. No pneumothorax. No acute bony abnormality. Avascular necrosis of the humeral heads is noted. IMPRESSION: Cardiomegaly with interstitial pulmonary edema and small to moderate  pleural effusions, larger on the right. Electronically Signed   By: Inge Rise M.D.   On: 05/19/2019 12:25   ECHOCARDIOGRAM COMPLETE  Result Date: 05/20/2019    ECHOCARDIOGRAM REPORT   Patient Name:   COTINA FREEDMAN Date of Exam: 05/20/2019 Medical Rec #:  [id number]      Height:  64.0 in Accession #:    [id number]     Weight:       208.8 lb Date of Birth:  Dec 26, 1932      BSA:          1.99 m Patient Age:    86 years       BP:           100/52 mmHg Patient Gender: F              HR:           73 bpm. [facility] Procedure: 2D Echo Indications:    CHF-Acute Diastolic 428.31 / I50.31  History:        Patient has prior history of Echocardiogram examinations, most                 recent 03/16/2015. Arrythmias:Atrial Fibrillation; Risk                 Factors:Dyslipidemia, Hypertension and Non-Smoker. Acute                 hypoxemic respiratory failure , First degree atrioventricular                 block, GERD, Non-rheumatic mitral regurgitation.  Sonographer:    Jeryl Columbia RDCS (AE) Referring Phys: [id number] Lamont Dowdy [facility] IMPRESSIONS  1. Left ventricular ejection fraction, by estimation, is 60 to 65%. The left ventricle has normal function. The left ventricle has no regional wall motion abnormalities. There is mild concentric left ventricular hypertrophy. Left ventricular diastolic parameters are indeterminate. Elevated left ventricular end-diastolic pressure.  2. Right ventricular systolic function is normal. The right ventricular size is mildly enlarged. There is severely elevated pulmonary artery systolic pressure.  3. Left atrial size was severely dilated.  4. Right atrial size was severely dilated.  5. IVC collapses. No RA or RV collapse. No signs of tamponade. Large pericardial effusion. The pericardial effusion is circumferential.  6. The mitral valve is grossly normal. Mild mitral valve regurgitation.  7. Tricuspid valve regurgitation is moderate.  8. The aortic valve is  tricuspid. Aortic valve regurgitation is mild. Mild aortic valve stenosis. Aortic valve area, by VTI measures 1.57 cm. Aortic valve mean gradient measures 17.0 mmHg. Aortic valve Vmax measures 2.79 m/s.  9. The inferior vena cava is dilated in size with >50% respiratory variability, suggesting right atrial pressure of 8 mmHg. FINDINGS  Left Ventricle: Left ventricular ejection fraction, by estimation, is 60 to 65%. The left ventricle has normal function. The left ventricle has no regional wall motion abnormalities. The left ventricular internal cavity size was normal in size. There is  mild concentric left ventricular hypertrophy. Left ventricular diastolic parameters are indeterminate. Elevated left ventricular end-diastolic pressure. Right Ventricle: The right ventricular size is mildly enlarged. No increase in right ventricular wall thickness. Right ventricular systolic function is normal. There is severely elevated pulmonary artery systolic pressure. The tricuspid regurgitant velocity is 4.02 m/s, and with an assumed right atrial pressure of 10 mmHg, the estimated right ventricular systolic pressure is 74.6 mmHg. Left Atrium: Left atrial size was severely dilated. Right Atrium: Right atrial size was severely dilated. Pericardium: IVC collapses. No RA or RV collapse. No signs of tamponade. A large pericardial effusion is present. The pericardial effusion is circumferential. Mitral Valve: The mitral valve is grossly normal. Mild mitral valve regurgitation. Tricuspid Valve: The tricuspid valve is grossly normal. Tricuspid valve regurgitation is moderate. Aortic Valve: The aortic valve  is tricuspid. Aortic valve regurgitation is mild. Aortic regurgitation PHT measures 540 msec. Mild aortic stenosis is present. Moderate aortic valve annular calcification. There is mild calcification of the aortic valve. Aortic valve mean gradient measures 17.0 mmHg. Aortic valve peak gradient measures 31.1 mmHg. Aortic valve area,  by VTI measures 1.57 cm. Pulmonic Valve: The pulmonic valve was grossly normal. Pulmonic valve regurgitation is not visualized. Aorta: The aortic root is normal in size and structure. Venous: The inferior vena cava is dilated in size with greater than 50% respiratory variability, suggesting right atrial pressure of 8 mmHg. IAS/Shunts: No atrial level shunt detected by color flow Doppler.  LEFT VENTRICLE PLAX 2D LVIDd:         5.12 cm  Diastology LVIDs:         3.07 cm  LV e' lateral:   8.81 cm/s LV PW:         1.34 cm  LV E/e' lateral: 15.3 LV IVS:        1.15 cm  LV e' medial:    6.53 cm/s LVOT diam:     1.80 cm  LV E/e' medial:  20.7 LV SV:         91.86 ml LV SV Index:   41.66 LVOT Area:     2.54 cm  LEFT ATRIUM              Index       RIGHT ATRIUM           Index LA diam:        4.90 cm  2.46 cm/m  RA Area:     38.50 cm LA Vol (A2C):   186.0 ml 93.35 ml/m RA Volume:   166.00 ml 83.32 ml/m LA Vol (A4C):   160.0 ml 80.30 ml/m LA Biplane Vol: 173.0 ml 86.83 ml/m  AORTIC VALVE AV Area (Vmax):    1.62 cm AV Area (Vmean):   1.49 cm AV Area (VTI):     1.57 cm AV Vmax:           278.67 cm/s AV Vmean:          192.333 cm/s AV VTI:            0.584 m AV Peak Grad:      31.1 mmHg AV Mean Grad:      17.0 mmHg LVOT Vmax:         177.33 cm/s LVOT Vmean:        112.667 cm/s LVOT VTI:          0.361 m LVOT/AV VTI ratio: 0.62 AI PHT:            540 msec  AORTA Ao Root diam: 2.80 cm MITRAL VALVE                TRICUSPID VALVE MV Area (PHT): 2.42 cm     TR Peak grad:   64.6 mmHg MV Decel Time: 313 msec     TR Vmax:        402.00 cm/s MR Peak grad: 63.0 mmHg MR Mean grad: 45.0 mmHg     SHUNTS MR Vmax:      397.00 cm/s   Systemic VTI:  0.36 m MR Vmean:     320.0 cm/s    Systemic Diam: 1.80 cm MV E velocity: 135.00 cm/s MV A velocity: 39.40 cm/s MV E/A ratio:  3.43 Prentice Docker MD Electronically signed by Prentice Docker MD Signature Date/Time: 05/20/2019/2:55:41 PM    Final  ECHOCARDIOGRAM LIMITED  Result  Date: 05/23/2019    ECHOCARDIOGRAM LIMITED REPORT   Patient Name:   ALLURE GREASER Date of Exam: 05/23/2019 Medical Rec #:  [id number]      Height:       64.0 in Accession #:    [id number]     Weight:       195.1 lb Date of Birth:  1932-12-16      BSA:          1.936 m Patient Age:    86 years       BP:           129/70 mmHg Patient Gender: F              HR:           78 bpm. [facility] Procedure: Limited Echo and Cardiac Doppler Indications:    Pericardial Effusion 423.9 / l31.3  History:        Patient has prior history of Echocardiogram examinations, most                 recent 05/20/2019. CHF, Arrythmias:Atrial Fibrillation; Risk                 Factors:Hypertension and Dyslipidemia. Non-Smoker. Acute                 hypoxemic respiratory failure , First degree atrioventricular                 block, GERD, Non-rheumatic mitral regurgitation.  Sonographer:    Celesta Gentile RCS Referring Phys: [phone] [facility] IMPRESSIONS  1. Limited study.  2. Large pericardial effusion, circumferential but to greater degree posterior and lateral. Dimensions are similar to previous study on February 17. No obvious significant respiratory variation in mitral outflow to suggest tamponade physiology. There is  no compression of RA or RV either, but evidence of significant pulmonary hypertension (last study RVSP was approximately 75 mmHg) and this could be inhibiting compression of RV chamber. Organizing material adherent to pericardium suggests possible chronicity, particularly given effusion size.  3. Left ventricular ejection fraction, by estimation, is 60 to 65%. The left ventricle has normal function. The left ventricle has no regional wall motion abnormalities. There is the interventricular septum is flattened in systole and diastole, consistent with right ventricular pressure and volume overload.  4. The right ventricular size is moderately enlarged.  5. Left atrial size was severely dilated.  6. Right  atrial size was severely dilated.  7. The mitral valve is grossly normal. FINDINGS  Left Ventricle: Left ventricular ejection fraction, by estimation, is 60 to 65%. The left ventricle has normal function. The left ventricle has no regional wall motion abnormalities. The interventricular septum is flattened in systole and diastole, consistent with right ventricular pressure and volume overload. Right Ventricle: The right ventricular size is moderately enlarged. Left Atrium: Left atrial size was severely dilated. Right Atrium: Right atrial size was severely dilated. Pericardium: Dimensions are similar to previous study on February 17. No obvious significant respiratory variation in mitral outflow to suggest tamponade physiology. There is no compression of RA or RV either, but evidence of significant pulmonary hypertension (last study RVSP was approximately 75 mmHg) and this could be inhibiting compression of RV chamber. Organizing material adherent to pericardium suggests possible chronicity, particularly given effusion size. A large pericardial effusion is present. The pericardial effusion is circumferential. Mitral Valve: The mitral valve is grossly normal. Mild to moderate  mitral annular calcification. Additional Comments: There is pleural effusion in the left lateral region. Nona Dell MD Electronically signed by Nona Dell MD Signature Date/Time: 05/23/2019/2:09:38 PM    Final    DG ESOPHAGUS W SINGLE CM (SOL OR THIN BA)  Result Date: 05/22/2019 CLINICAL DATA:  DYSPHAGIA. EXAM: ESOPHOGRAM/BARIUM SWALLOW TECHNIQUE: Single contrast examination was performed using thin barium or water soluble. FLUOROSCOPY TIME:  Fluoroscopy Time: 2 minutes 18 seconds Radiation Exposure Index (if provided by the fluoroscopic device): 38.6 mGy COMPARISON:  None. FINDINGS: The patient ingested thin barium in a semi upright position. The patient has poor esophageal peristalsis with persistent spasm in the distal esophagus  consistent with achalasia. I placed the patient in an almost upright position and only a small amount of barium trickled into the nondistended stomach. IMPRESSION: Achalasia. Minimal emptying of the dilated esophagus. Minimal peristalsis in the esophagus. The patient tolerated the procedure well. No immediate complications. Electronically Signed   By: Francene Boyers M.D.   On: 05/22/2019 11:18    CBC Recent Labs  Lab 06/09/19 1740 06/10/19 0334 06/11/19 0923 06/13/19 0536  WBC 5.3 4.8 3.5* 3.4*  HGB 11.5* 10.4* 10.2* 10.2*  HCT 36.1 33.3* 33.1* 33.4*  PLT 169 156 130* 107*  MCV 97.6 99.7 100.3* 99.1  MCH 31.1 31.1 30.9 30.3  MCHC 31.9 31.2 30.8 30.5  RDW 15.6* 15.3 15.2 15.0  LYMPHSABS 0.7  --   --   --   MONOABS 0.7  --   --   --   EOSABS 0.1  --   --   --   BASOSABS 0.0  --   --   --     Chemistries  Recent Labs  Lab 06/09/19 1740 06/09/19 2318 06/10/19 1310 06/10/19 1831 06/11/19 0923 06/12/19 0919 06/13/19 0536  NA 128*   < > 133* 132* 135 136 134*  K 7.2*   < > 6.1* 6.1* 5.5* 4.7 4.4  CL 84*   < > 91* 89* 90* 89* 89*  CO2 34*   < > 33* 33* 34* 34* 34*  GLUCOSE 101*   < > 85 90 93 94 105*  BUN 112*   < > 113* 112* 116* 118* 121*  CREATININE 6.12*   < > 5.86* 5.89* 5.75* 5.62* 5.56*  CALCIUM 8.8*   < > 8.9 8.8* 8.8* 8.8* 8.6*  MG 2.9*  --   --   --   --   --   --   AST 32  --   --   --  22  --   --   ALT 21  --   --   --  17  --   --   ALKPHOS 135*  --   --   --  111  --   --   BILITOT 1.2  --   --   --  1.3*  --   --    < > = values in this interval not displayed.   ------------------------------------------------------------------------------------------------------------------ No results for input(s): CHOL, HDL, LDLCALC, TRIG, CHOLHDL, LDLDIRECT in the last 72 hours.  Lab Results  Component Value Date   HGBA1C 6.2 06/16/2016   ------------------------------------------------------------------------------------------------------------------ No results for  input(s): TSH, T4TOTAL, T3FREE, THYROIDAB in the last 72 hours.  Invalid input(s): FREET3 ------------------------------------------------------------------------------------------------------------------ No results for input(s): VITAMINB12, FOLATE, FERRITIN, TIBC, IRON, RETICCTPCT in the last 72 hours.  Coagulation profile No results for input(s): INR, PROTIME in the last 168 hours.  No results for input(s): DDIMER  in the last 72 hours.  Cardiac Enzymes No results for input(s): CKMB, TROPONINI, MYOGLOBIN in the last 168 hours.  Invalid input(s): CK ------------------------------------------------------------------------------------------------------------------    Component Value Date/Time   BNP 359.0 (H) 05/19/2019 1200    Shon Haleourage Sigmund Morera M.D on 06/13/2019 at 11:35 AM  Go to [email or web address] - for contact info  [facility] - Office  [phone]

## 2019-06-13 NOTE — Progress Notes (Signed)
[name] Progress Note    Assessment/ Plan:   1.  AKI on CKD 3: sig AKI from 1.2 (3/1) to 6.12 in a matter of days.  Some hypotension in ED and in ICU.  Looks like hemodynamic issues causing ATN.  No hydro on 3/10 renal US. Strict I and O. No HD per son in [place] discussion.  Greatly appreciate palliative care. Creatinine actually trending down a little-  BUN up a little.  Still very abnormal though  2.  Hyperkalemia: resolved with medical management  3.  Acute metabolic encephalopathy: CT head negative, appears to be multifactorial - pretty out of it today  4.  UTI: urine looks dirty,initially started on aztreonam urine culture showing E coli and Klebsiella--> switched to Unasyn 3/12 as she has tolerated Augmentin in the past   5.  Hypertension: hold lisinopril--> she is hypotensive here--> sepsis + pericardial effusion?  No pressors per son. I cannot imagine that she is going to survive this.  Not on lasix- would not use since is so hypotensive and also has a contraction alkalosis.  Also with AKI would likely not be effective   6.  Large pericardial effusion: consider repeat TTE +/- cardiology c/s to assess for tamponade--> she has very sig JVD and hypotension--> but no aggressive measures   7.  Dispo:  is DNR-  No escalation of care and no dialysis.  BP is as low as the 70's.  I cannot imagine that she will survive this.  If she does, it sounds like residential hospice is desired.  Renal has little else to offer, will sign off  Subjective:    UOP at least 575 over last 24 hours.  BPs as low as 74/41.  She is very somnolent and HOH.  Tray in front of her-  Not really touched   Objective:   BP (!) 86/49   Pulse (!) 52   Temp (!) 97.4 F (36.3 C) (Axillary)   Resp 10   Ht 5\' 2"  (1.575 m)   Wt 81.2 kg   SpO2 97%   BMI 32.74 kg/m   Intake/Output Summary (Last 24 hours) at 06/13/2019 1243 Last data filed at 06/13/2019 0854 Gross per 24 hour  Intake 318 ml   Output 575 ml  Net -257 ml   Weight change:   Physical Exam: GEN ill-appearing, sitting up in bed asleep- can rouse for very short periods of time- very HOH HEENT eyes closed NECK JVD up to angle of mandible on both sides even sitting up PULM clear anteriorly, dim posteriorly CV RRR II/VI systolic murmur ABD soft, no more suprapubic tenderness EXT 2+ LE edema with chronic venous stasis changes, warm NEURO alert this AM SKIN warm and dry  Imaging: No results found.  Labs: BMET Recent Labs  Lab 06/09/19 2318 06/10/19 0334 06/10/19 1310 06/10/19 1831 06/11/19 0923 06/12/19 0919 06/13/19 0536  NA 132* 132* 133* 132* 135 136 134*  K 6.5* 6.7* 6.1* 6.1* 5.5* 4.7 4.4  CL 88* 89* 91* 89* 90* 89* 89*  CO2 35* 35* 33* 33* 34* 34* 34*  GLUCOSE 55* 86 85 90 93 94 105*  BUN 111* 105* 113* 112* 116* 118* 121*  CREATININE 6.19* 5.96* 5.86* 5.89* 5.75* 5.62* 5.56*  CALCIUM 8.6* 8.5* 8.9 8.8* 8.8* 8.8* 8.6*  PHOS  --   --  5.4* 6.0*  --  6.3* 5.7*   CBC Recent Labs  Lab 06/09/19 1740 06/10/19 0334 06/11/19 0923 06/13/19 0536  WBC 5.3  4.8 3.5* 3.4*  NEUTROABS 3.9  --   --   --   HGB 11.5* 10.4* 10.2* 10.2*  HCT 36.1 33.3* 33.1* 33.4*  MCV 97.6 99.7 100.3* 99.1  PLT 169 156 130* 107*    Medications:    . Chlorhexidine Gluconate Cloth  6 each Topical Daily  . heparin  5,000 Units Subcutaneous Q8H  . mouth rinse  15 mL Mouth Rinse BID  . promethazine  25 mg Rectal Once      [name]  06/13/2019, 12:43 PM

## 2019-06-14 LAB — RENAL FUNCTION PANEL
Albumin: 2.6 g/dL — ABNORMAL LOW (ref 3.5–5.0)
Anion gap: 11 (ref 5–15)
BUN: 126 mg/dL — ABNORMAL HIGH (ref 8–23)
CO2: 35 mmol/L — ABNORMAL HIGH (ref 22–32)
Calcium: 8.5 mg/dL — ABNORMAL LOW (ref 8.9–10.3)
Chloride: 89 mmol/L — ABNORMAL LOW (ref 98–111)
Creatinine, Ser: 5.73 mg/dL — ABNORMAL HIGH (ref 0.44–1.00)
GFR calc Af Amer: 7 mL/min — ABNORMAL LOW (ref >60)
GFR calc non Af Amer: 6 mL/min — ABNORMAL LOW (ref >60)
Glucose, Bld: 102 mg/dL — ABNORMAL HIGH (ref 70–99)
Phosphorus: 6.2 mg/dL — ABNORMAL HIGH (ref 2.5–4.6)
Potassium: 5.1 mmol/L (ref 3.5–5.1)
Sodium: 135 mmol/L (ref 135–145)

## 2019-06-14 LAB — CORTISOL-AM, BLOOD: Cortisol - AM: 13.7 ug/dL (ref 6.7–22.6)

## 2019-06-14 LAB — GLUCOSE, CAPILLARY
Glucose-Capillary: 109 mg/dL — ABNORMAL HIGH (ref 70–99)
Glucose-Capillary: 110 mg/dL — ABNORMAL HIGH (ref 70–99)
Glucose-Capillary: 283 mg/dL — ABNORMAL HIGH (ref 70–99)
Glucose-Capillary: 83 mg/dL (ref 70–99)

## 2019-06-14 MED ORDER — NOREPINEPHRINE 4 MG/250ML-% IV SOLN
0.0000 ug/min | INTRAVENOUS | Status: DC
  Administered 2019-06-14: 2 ug/min via INTRAVENOUS
  Filled 2019-06-14: qty 250

## 2019-06-14 NOTE — TOC Progression Note (Addendum)
Transition of Care [facility]) - Progression Note    Patient Details  Name: [name] MRN: [id number] Date of Birth: 03-Nov-1932  Transition of Care [facility]) CM/SW Contact  Elliot Gault, LCSW Phone Number: 06/14/2019, 3:26 PM  Clinical Narrative:     TOC following. Discussed pt's status with MD. Per MD, pt with continuing hypotension. MD aware that pt will need PT/OT eval if she is to return to SNF rehab. If pt able to participate in therapy, will need insurance auth and will return to [place] [place]. If unable to do therapy, will need to discuss with family about long term care placement under Medicaid. Discussed with Debbie at [facility] today. She states that if pt does not come under insurance, she will need to talk to family and Social Services about pt's Medicaid status and [facility] would need to be assigned as rep payee for pt's income.  Pt's son not at bedside at this time. Attempted to reach pt's son by phone to discuss above. Son did not answer and unable to leave voicemail message.  Assigned TOC will follow up tomorrow.       Expected Discharge Plan and Services                                                 Social Determinants of Health (SDOH) Interventions    Readmission Risk Interventions Readmission Risk Prevention Plan 06/02/2019  Transportation Screening Complete  Medication Review (RN CM) Complete  Some recent data might be hidden

## 2019-06-14 NOTE — Progress Notes (Signed)
  extensive conversation with the patient's son about goals of care, patient remaines hemodynamically unstable with low BP, patient remains encephalopathic -Patient son states that patient would not want aggressive interventions -Specifically no hemodialysis, no pressors, -Patient son requested that we do not escalate care at this time, patient son requests status quo for now.  - He wants to see how his mom does over the next day or 2 prior to making further decisions -Patient remains a DNR/DNI -Possible transfer to SNF with palliative care following on 06/15/2019  Shon Hale, MD

## 2019-06-14 NOTE — Progress Notes (Signed)
Patient is having serial low blood pressures. MD notified. Levophed drip ordered.

## 2019-06-14 NOTE — Progress Notes (Signed)
Patient Demographics:    [name], is a 86 y.o. female, DOB - 10-18-1932, AOZ:[id number]  Admit date - 06/09/2019   Admitting Physician Ejiroghene Arlyce Dice, MD  Outpatient Primary MD for the patient is Janora Norlander, DO  LOS - 5   Chief Complaint  Patient presents with  . Altered Mental Status  . hyperkalemia        Subjective:    [name] today has no fevers, no emesis,  No chest pain, resting comfortably  Hypotension persist -Lethargy persist -Poor urine output persist -Oral intake is not great  Assessment  & Plan :    Principal Problem:   Acute metabolic encephalopathy Active Problems:   Goals of care, counseling/discussion   Palliative care encounter   AF (atrial fibrillation) (HCC)   Hyperkalemia   Chronic diastolic CHF (congestive heart failure) (HCC)   Acute kidney injury superimposed on CKD ([place])   Acute renal failure ([place])   Encounter for hospice care discussion  Brief Summary:- 86 y.o. female with medical history significant for atrial fibrillation, hypertension, congestive heart failure.  Admitted on 06/09/2019 with acute metabolic encephalopathy in the setting of renal failure with presumed uremia/azotemia and UTI -Possible transfer to SNF with palliative care following on 06/15/2019 if son is agreeable pending further conversations with palliative care team    A/p 1)Social/Ethics-extensive conversation with the patient's son about goals of care, patient remaines hemodynamically unstable with low BP, patient remains encephalopathic -Patient son states that patient would not want aggressive interventions -Specifically no hemodialysis, no pressors, -Patient son requested that we do not escalate care at this time, patient son requests status quo for now.  He wants to see how his mom does over the next day or 2 prior to making further decisions -Patient remains a  DNR/DNI --ABG with mostly "compensated" Hypercarpnia  2)Aki on CKD IIIb with Persistent Hyperkalemia--- potassium down to 4.7 from a peak of 7.2 after hyperkalemia cocktail and Lokelma x3 doses -Baseline creatinine 1.2 just over a week PTA -Creatinine still above 5 -Discussed with Nephrology service patient appears somewhat volume overloaded, advised no further IV fluids -Renal ultrasound without obstructive uropathy -Keep Foley in for accurate ins and O -Patient son declines HD -Lisinopril discontinued  3)Acute Metabolic Encephalopathy--more awake, more interactive, overall still lethargic for long peers of time suspect uremia/azotemia related, gram-negative rod UTI is probably contributory as well ABG with mostly compensated hypercapnia, CT head without acute findings  4) E. coli and Klebsiella UTI---c/n  Unasyn (started 06/12/19) per sensitivity report, stopped  Aztreonam --patient with allergies to sulfa, cephalosporins and doxycycline -Plan to change to p.o. Augmentin on 06/15/2019  HTN--- c/n Hold Lisinopril due to hypotension and hyperkalemia  4)Large Pericardial Effusion--this is probably contributing to patient's hypotension  5)Permanent Atrial Fibrillation--- Eliquis on hold, patient is actually bradycardic without any AV nodal blocking agents  6)HFpEF/chronic diastolic dysfunction CHF----Lasix on hold due to low blood pressure and kidney concerns.  -Clinically somewhat volume overloaded but BP too low to allow for diuresis  Disposition/Need for in-Hospital Stay- patient unable to be discharged at this time due to --- worsening renal function, and IV antibiotics for E. coli and Klebsiella UTI --plan to switch to oral antibiotics on 06/15/2019 --Possible transfer to SNF with palliative care following on 06/15/2019 if son is agreeable pending further conversations with palliative care team  Code Status : Full code  Family Communication:  Discussed with son times  Consults  :  Cardiology, palliative care and nephrology  DVT Prophylaxis  :    - Heparin - SCDs   Lab Results  Component Value Date   PLT 107 (L) 06/13/2019    Inpatient Medications  Scheduled Meds: . Chlorhexidine Gluconate Cloth  6 each Topical Daily  . heparin  5,000 Units Subcutaneous Q8H  . mouth rinse  15 mL Mouth Rinse BID  . promethazine  25 mg Rectal Once   Continuous Infusions: . ampicillin-sulbactam (UNASYN) IV 3 g (06/14/19 0807)   PRN Meds:.    Anti-infectives (From admission, onward)   Start     Dose/Rate Route Frequency Ordered Stop   06/13/19 0900  Ampicillin-Sulbactam (UNASYN) 3 g in sodium chloride 0.9 % 100 mL IVPB     3 g 200 mL/hr over 30 Minutes Intravenous Every 24 hours 06/12/19 0900     06/12/19 0900  Ampicillin-Sulbactam (UNASYN) 3 g in sodium chloride 0.9 % 100 mL IVPB     3 g 200 mL/hr over 30 Minutes Intravenous  Once 06/12/19 0803 06/12/19 1039   06/10/19 1400  aztreonam (AZACTAM) 1 g in sodium chloride 0.9 % 100 mL IVPB  Status:  Discontinued     1 g 200 mL/hr over 30 Minutes Intravenous Every 12 hours 06/10/19 1317 06/10/19 1319   06/10/19 1400  aztreonam (AZACTAM) 0.5 g in dextrose 5 % 50 mL IVPB  Status:  Discontinued     0.5 g 100 mL/hr over 30 Minutes Intravenous Every 8 hours 06/10/19 1320 06/12/19 0803        Objective:   Vitals:   06/14/19 1559 06/14/19 1600 06/14/19 1700 06/14/19 1800  BP:  (!) 69/45 (!) 81/48 (!) 88/46  Pulse:  (!) 59 (!) 54 (!) 53  Resp:  15 14 12   Temp: (!) 97.5 F (36.4 C)     TempSrc: Oral     SpO2:  96% 94% 96%  Weight:      Height:        Wt Readings from Last 3 Encounters:  06/11/19 81.2 kg  06/01/19 74.7 kg  05/19/19 95.6 kg     Intake/Output Summary (Last 24 hours) at 06/14/2019 1914 Last data filed at 06/14/2019 1803 Gross per 24 hour  Intake 480.56 ml  Output 150 ml  Net 330.56 ml   Physical Exam  Gen:-Awake, falls sleep easily but able to wake up and answer questions HEENT:- .AT, No  sclera icterus Neck-Supple Neck, significant JVD elevation noted.  Lungs-diminished in bases without wheezing  CV- S1, S2 normal, irregular Abd-  +ve B.Sounds, Abd Soft, No tenderness,    Extremity/Skin:-  Trace edema, pedal pulses present , significant lower extremity varicosities Psych-less lethargic, somewhat disoriented Neuro-generalized weakness, no new focal deficits,  GU-Foley in situ   Data Review:   Micro Results Recent Results (from the past 240 hour(s))  MRSA PCR Screening     Status: None   Collection Time: 06/10/19  5:03 AM   Specimen: Nasal Mucosa; Nasopharyngeal  Result Value Ref Range Status   MRSA by PCR NEGATIVE NEGATIVE Final  Comment:        The GeneXpert MRSA Assay (FDA approved for NASAL specimens only), is one component of a comprehensive MRSA colonization surveillance program. It is not intended to diagnose MRSA infection nor to guide or monitor treatment for MRSA infections. Performed at [facility], [address], [place], [place] [address]   Culture, Urine     Status: Abnormal   Collection Time: 06/10/19  5:18 AM   Specimen: Urine, Clean Catch  Result Value Ref Range Status   Specimen Description URINE, CLEAN CATCH  Final   Special Requests   Final    NONE Performed at [facility], [address] [place], [place] [address]    Culture (A)  Final    >=100,000 COLONIES/mL ESCHERICHIA COLI >=100,000 COLONIES/mL KLEBSIELLA PNEUMONIAE    Report Status 06/12/2019 FINAL  Final   Organism ID, Bacteria ESCHERICHIA COLI (A)  Final   Organism ID, Bacteria KLEBSIELLA PNEUMONIAE (A)  Final      Susceptibility   Escherichia coli - MIC*    AMPICILLIN 4 SENSITIVE Sensitive     CEFAZOLIN <=4 SENSITIVE Sensitive     CEFTRIAXONE <=0.25 SENSITIVE Sensitive     CIPROFLOXACIN <=0.25 SENSITIVE Sensitive     GENTAMICIN <=1 SENSITIVE Sensitive     IMIPENEM <=0.25 SENSITIVE Sensitive     NITROFURANTOIN <=16 SENSITIVE Sensitive     TRIMETH/SULFA <=20  SENSITIVE Sensitive     AMPICILLIN/SULBACTAM <=2 SENSITIVE Sensitive     PIP/TAZO <=4 SENSITIVE Sensitive     * >=100,000 COLONIES/mL ESCHERICHIA COLI   Klebsiella pneumoniae - MIC*    AMPICILLIN >=32 RESISTANT Resistant     CEFAZOLIN <=4 SENSITIVE Sensitive     CEFTRIAXONE <=0.25 SENSITIVE Sensitive     CIPROFLOXACIN <=0.25 SENSITIVE Sensitive     GENTAMICIN <=1 SENSITIVE Sensitive     IMIPENEM <=0.25 SENSITIVE Sensitive     NITROFURANTOIN 32 SENSITIVE Sensitive     TRIMETH/SULFA <=20 SENSITIVE Sensitive     AMPICILLIN/SULBACTAM 8 SENSITIVE Sensitive     PIP/TAZO <=4 SENSITIVE Sensitive     * >=100,000 COLONIES/mL KLEBSIELLA PNEUMONIAE    Radiology Reports CT HEAD WO CONTRAST  Result Date: 06/09/2019 CLINICAL DATA:  86 year old female with altered mental status common cephalopathy. EXAM: CT HEAD WITHOUT CONTRAST TECHNIQUE: Contiguous axial images were obtained from the base of the skull through the vertex without intravenous contrast. COMPARISON:  Brain MRI 02/25/2018. Head CT 05/24/2019. FINDINGS: Brain: Perivascular space at the inferior left basal ganglia again noted (normal variant). Cerebral volume is within normal limits for age. No midline shift, ventriculomegaly, mass effect, evidence of mass lesion, intracranial hemorrhage or evidence of cortically based acute infarction. Gray-white matter differentiation appears stable and within normal limits for age. Vascular: Calcified atherosclerosis at the skull base. No suspicious intracranial vascular hyperdensity. Skull: No acute osseous abnormality identified. Sinuses/Orbits: Visualized paranasal sinuses and mastoids are stable and well pneumatized. Other: No acute orbit or scalp soft tissue finding. IMPRESSION: Non contrast CT appearance of the brain remains stable since last month and within normal limits for age. Electronically Signed   By: Odessa Fleming M.D.   On: 06/09/2019 22:41   CT HEAD WO CONTRAST  Result Date: 05/24/2019 CLINICAL DATA:   86 year old female with altered mental status. Admitted with respiratory failure. EXAM: CT HEAD WITHOUT CONTRAST TECHNIQUE: Contiguous axial images were obtained from the base of the skull through the vertex without intravenous contrast. COMPARISON:  Brain MRI 02/25/2018. FINDINGS: Brain: Cerebral volume is stable and within normal limits for  age. There is a chronic perivascular space at the inferior left lentiform (normal variant). No midline shift, ventriculomegaly, mass effect, evidence of mass lesion, intracranial hemorrhage or evidence of cortically based acute infarction. Normal for age gray-white matter differentiation. Vascular: Calcified atherosclerosis at the skull base. Skull: No acute osseous abnormality identified. Sinuses/Orbits: Visualized paranasal sinuses and mastoids are stable and well pneumatized. Other: No acute orbit or scalp soft tissue finding. Scalp vessels are prominent diffusely, although unchanged from the prior MRI. IMPRESSION: Normal for age non contrast CT appearance of the brain. Electronically Signed   By: Odessa Fleming M.D.   On: 05/24/2019 18:19   US RENAL  Result Date: 06/10/2019 CLINICAL DATA:  Acute renal injury EXAM: RENAL / URINARY TRACT ULTRASOUND COMPLETE COMPARISON:  04/10/2017 FINDINGS: Right Kidney: Renal measurements: 12.5 x 4.4 x 5.0 cm. = volume: 145 mL. No mass lesion or hydronephrosis is noted. Mild increased echogenicity is seen. Left Kidney: Renal measurements: 12.2 x 5.7 x 6.0 cm = volume: 217 mL. Mild increased echogenicity is noted. Scattered cysts are noted throughout the left kidney. The largest of these measures 2.3 cm and is simple in nature. The cysts are all clustered in the midportion of the kidney. No obstructive changes are seen. Bladder: Decompressed Other: None. IMPRESSION: Increased echogenicity consistent with medical renal disease. Left renal cysts. Electronically Signed   By: Alcide Clever M.D.   On: 06/10/2019 15:20   DG CHEST PORT 1  VIEW  Result Date: 05/27/2019 CLINICAL DATA:  Bilateral pleural effusion and pericardial effusion, negative COVID-19 testing 8 days prior EXAM: PORTABLE CHEST 1 VIEW COMPARISON:  Radiograph 05/24/2019 FINDINGS: Diminishing lung volumes with persistent bilateral interstitial and airspace opacities, slightly increasing in the right upper and lower lung though this may be attributable to the lower volumes. Stable cardiomegaly. Bilateral effusions. No pneumothorax. No acute osseous or soft tissue abnormality. Telemetry leads overlie the chest. IMPRESSION: Diminishing lung volumes with persistent bilateral interstitial and airspace opacities, slightly increasing in the right upper and lower lung though this may be attributable to lower volumes. Bilateral effusions. Electronically Signed   By: Kreg Shropshire M.D.   On: 05/27/2019 05:58   DG CHEST PORT 1 VIEW  Result Date: 05/24/2019 CLINICAL DATA:  Acute on chronic respiratory failure. EXAM: PORTABLE CHEST 1 VIEW COMPARISON:  05/19/2019 FINDINGS: Patient is rotated to the right. There is mild motion artifact. Lungs are adequately inflated with interval worsening bibasilar opacification likely moderate bilateral effusions with associated bibasilar atelectasis. Infection in the mid to lower lungs is possible. Stable cardiomegaly with mild prominence of the perihilar markings suggesting mild vascular congestion. Remainder the exam is unchanged. IMPRESSION: Mild cardiomegaly with mild vascular congestion. Worsening bilateral moderate pleural effusions likely with associated bibasilar atelectasis. Infection in the lung bases is possible. Electronically Signed   By: Elberta Fortis M.D.   On: 05/24/2019 10:40   DG Chest Port 1 View  Result Date: 05/19/2019 CLINICAL DATA:  Decreased oxygen saturation today. Lower extremity swelling. EXAM: PORTABLE CHEST 1 VIEW COMPARISON:  PA and lateral chest 05/28/2017. FINDINGS: There is massive cardiomegaly. Small to moderate bilateral  pleural effusions are seen, larger on the right. Pulmonary edema and basilar atelectasis are seen. No pneumothorax. No acute bony abnormality. Avascular necrosis of the humeral heads is noted. IMPRESSION: Cardiomegaly with interstitial pulmonary edema and small to moderate pleural effusions, larger on the right. Electronically Signed   By: Drusilla Kanner M.D.   On: 05/19/2019 12:25   ECHOCARDIOGRAM COMPLETE  Result Date:  05/20/2019    ECHOCARDIOGRAM REPORT   Patient Name:   DANYAL ADORNO Date of Exam: 05/20/2019 Medical Rec #:  [id number]      Height:       64.0 in Accession #:    [id number]     Weight:       208.8 lb Date of Birth:  1932/11/10      BSA:          1.99 m Patient Age:    86 years       BP:           100/52 mmHg Patient Gender: F              HR:           73 bpm. [facility] Procedure: 2D Echo Indications:    CHF-Acute Diastolic 428.31 / I50.31  History:        Patient has prior history of Echocardiogram examinations, most                 recent 03/16/2015. Arrythmias:Atrial Fibrillation; Risk                 Factors:Dyslipidemia, Hypertension and Non-Smoker. Acute                 hypoxemic respiratory failure , First degree atrioventricular                 block, GERD, Non-rheumatic mitral regurgitation.  Sonographer:    Jeryl Columbia RDCS (AE) Referring Phys: [id number] Lamont Dowdy [facility] IMPRESSIONS  1. Left ventricular ejection fraction, by estimation, is 60 to 65%. The left ventricle has normal function. The left ventricle has no regional wall motion abnormalities. There is mild concentric left ventricular hypertrophy. Left ventricular diastolic parameters are indeterminate. Elevated left ventricular end-diastolic pressure.  2. Right ventricular systolic function is normal. The right ventricular size is mildly enlarged. There is severely elevated pulmonary artery systolic pressure.  3. Left atrial size was severely dilated.  4. Right atrial size was severely dilated.  5. IVC  collapses. No RA or RV collapse. No signs of tamponade. Large pericardial effusion. The pericardial effusion is circumferential.  6. The mitral valve is grossly normal. Mild mitral valve regurgitation.  7. Tricuspid valve regurgitation is moderate.  8. The aortic valve is tricuspid. Aortic valve regurgitation is mild. Mild aortic valve stenosis. Aortic valve area, by VTI measures 1.57 cm. Aortic valve mean gradient measures 17.0 mmHg. Aortic valve Vmax measures 2.79 m/s.  9. The inferior vena cava is dilated in size with >50% respiratory variability, suggesting right atrial pressure of 8 mmHg. FINDINGS  Left Ventricle: Left ventricular ejection fraction, by estimation, is 60 to 65%. The left ventricle has normal function. The left ventricle has no regional wall motion abnormalities. The left ventricular internal cavity size was normal in size. There is  mild concentric left ventricular hypertrophy. Left ventricular diastolic parameters are indeterminate. Elevated left ventricular end-diastolic pressure. Right Ventricle: The right ventricular size is mildly enlarged. No increase in right ventricular wall thickness. Right ventricular systolic function is normal. There is severely elevated pulmonary artery systolic pressure. The tricuspid regurgitant velocity is 4.02 m/s, and with an assumed right atrial pressure of 10 mmHg, the estimated right ventricular systolic pressure is 74.6 mmHg. Left Atrium: Left atrial size was severely dilated. Right Atrium: Right atrial size was severely dilated. Pericardium: IVC collapses. No RA or RV collapse. No signs of tamponade. A large pericardial effusion is  present. The pericardial effusion is circumferential. Mitral Valve: The mitral valve is grossly normal. Mild mitral valve regurgitation. Tricuspid Valve: The tricuspid valve is grossly normal. Tricuspid valve regurgitation is moderate. Aortic Valve: The aortic valve is tricuspid. Aortic valve regurgitation is mild. Aortic  regurgitation PHT measures 540 msec. Mild aortic stenosis is present. Moderate aortic valve annular calcification. There is mild calcification of the aortic valve. Aortic valve mean gradient measures 17.0 mmHg. Aortic valve peak gradient measures 31.1 mmHg. Aortic valve area, by VTI measures 1.57 cm. Pulmonic Valve: The pulmonic valve was grossly normal. Pulmonic valve regurgitation is not visualized. Aorta: The aortic root is normal in size and structure. Venous: The inferior vena cava is dilated in size with greater than 50% respiratory variability, suggesting right atrial pressure of 8 mmHg. IAS/Shunts: No atrial level shunt detected by color flow Doppler.  LEFT VENTRICLE PLAX 2D LVIDd:         5.12 cm  Diastology LVIDs:         3.07 cm  LV e' lateral:   8.81 cm/s LV PW:         1.34 cm  LV E/e' lateral: 15.3 LV IVS:        1.15 cm  LV e' medial:    6.53 cm/s LVOT diam:     1.80 cm  LV E/e' medial:  20.7 LV SV:         91.86 ml LV SV Index:   41.66 LVOT Area:     2.54 cm  LEFT ATRIUM              Index       RIGHT ATRIUM           Index LA diam:        4.90 cm  2.46 cm/m  RA Area:     38.50 cm LA Vol (A2C):   186.0 ml 93.35 ml/m RA Volume:   166.00 ml 83.32 ml/m LA Vol (A4C):   160.0 ml 80.30 ml/m LA Biplane Vol: 173.0 ml 86.83 ml/m  AORTIC VALVE AV Area (Vmax):    1.62 cm AV Area (Vmean):   1.49 cm AV Area (VTI):     1.57 cm AV Vmax:           278.67 cm/s AV Vmean:          192.333 cm/s AV VTI:            0.584 m AV Peak Grad:      31.1 mmHg AV Mean Grad:      17.0 mmHg LVOT Vmax:         177.33 cm/s LVOT Vmean:        112.667 cm/s LVOT VTI:          0.361 m LVOT/AV VTI ratio: 0.62 AI PHT:            540 msec  AORTA Ao Root diam: 2.80 cm MITRAL VALVE                TRICUSPID VALVE MV Area (PHT): 2.42 cm     TR Peak grad:   64.6 mmHg MV Decel Time: 313 msec     TR Vmax:        402.00 cm/s MR Peak grad: 63.0 mmHg MR Mean grad: 45.0 mmHg     SHUNTS MR Vmax:      397.00 cm/s   Systemic VTI:  0.36 m MR  Vmean:     320.0 cm/s  Systemic Diam: 1.80 cm MV E velocity: 135.00 cm/s MV A velocity: 39.40 cm/s MV E/A ratio:  3.43 Prentice Docker MD Electronically signed by Prentice Docker MD Signature Date/Time: 05/20/2019/2:55:41 PM    Final    ECHOCARDIOGRAM LIMITED  Result Date: 05/23/2019    ECHOCARDIOGRAM LIMITED REPORT   Patient Name:   DEMETRI KERMAN Date of Exam: 05/23/2019 Medical Rec #:  [id number]      Height:       64.0 in Accession #:    [id number]     Weight:       195.1 lb Date of Birth:  1933/01/04      BSA:          1.936 m Patient Age:    86 years       BP:           129/70 mmHg Patient Gender: F              HR:           78 bpm. [facility] Procedure: Limited Echo and Cardiac Doppler Indications:    Pericardial Effusion 423.9 / l31.3  History:        Patient has prior history of Echocardiogram examinations, most                 recent 05/20/2019. CHF, Arrythmias:Atrial Fibrillation; Risk                 Factors:Hypertension and Dyslipidemia. Non-Smoker. Acute                 hypoxemic respiratory failure , First degree atrioventricular                 block, GERD, Non-rheumatic mitral regurgitation.  Sonographer:    Celesta Gentile RCS Referring Phys: [phone] [facility] IMPRESSIONS  1. Limited study.  2. Large pericardial effusion, circumferential but to greater degree posterior and lateral. Dimensions are similar to previous study on February 17. No obvious significant respiratory variation in mitral outflow to suggest tamponade physiology. There is  no compression of RA or RV either, but evidence of significant pulmonary hypertension (last study RVSP was approximately 75 mmHg) and this could be inhibiting compression of RV chamber. Organizing material adherent to pericardium suggests possible chronicity, particularly given effusion size.  3. Left ventricular ejection fraction, by estimation, is 60 to 65%. The left ventricle has normal function. The left ventricle has no regional  wall motion abnormalities. There is the interventricular septum is flattened in systole and diastole, consistent with right ventricular pressure and volume overload.  4. The right ventricular size is moderately enlarged.  5. Left atrial size was severely dilated.  6. Right atrial size was severely dilated.  7. The mitral valve is grossly normal. FINDINGS  Left Ventricle: Left ventricular ejection fraction, by estimation, is 60 to 65%. The left ventricle has normal function. The left ventricle has no regional wall motion abnormalities. The interventricular septum is flattened in systole and diastole, consistent with right ventricular pressure and volume overload. Right Ventricle: The right ventricular size is moderately enlarged. Left Atrium: Left atrial size was severely dilated. Right Atrium: Right atrial size was severely dilated. Pericardium: Dimensions are similar to previous study on February 17. No obvious significant respiratory variation in mitral outflow to suggest tamponade physiology. There is no compression of RA or RV either, but evidence of significant pulmonary hypertension (last study RVSP was approximately 75 mmHg) and this could be inhibiting  compression of RV chamber. Organizing material adherent to pericardium suggests possible chronicity, particularly given effusion size. A large pericardial effusion is present. The pericardial effusion is circumferential. Mitral Valve: The mitral valve is grossly normal. Mild to moderate mitral annular calcification. Additional Comments: There is pleural effusion in the left lateral region. Nona Dell MD Electronically signed by Nona Dell MD Signature Date/Time: 05/23/2019/2:09:38 PM    Final    DG ESOPHAGUS W SINGLE CM (SOL OR THIN BA)  Result Date: 05/22/2019 CLINICAL DATA:  DYSPHAGIA. EXAM: ESOPHOGRAM/BARIUM SWALLOW TECHNIQUE: Single contrast examination was performed using thin barium or water soluble. FLUOROSCOPY TIME:  Fluoroscopy Time: 2  minutes 18 seconds Radiation Exposure Index (if provided by the fluoroscopic device): 38.6 mGy COMPARISON:  None. FINDINGS: The patient ingested thin barium in a semi upright position. The patient has poor esophageal peristalsis with persistent spasm in the distal esophagus consistent with achalasia. I placed the patient in an almost upright position and only a small amount of barium trickled into the nondistended stomach. IMPRESSION: Achalasia. Minimal emptying of the dilated esophagus. Minimal peristalsis in the esophagus. The patient tolerated the procedure well. No immediate complications. Electronically Signed   By: Francene Boyers M.D.   On: 05/22/2019 11:18    CBC Recent Labs  Lab 06/09/19 1740 06/10/19 0334 06/11/19 0923 06/13/19 0536  WBC 5.3 4.8 3.5* 3.4*  HGB 11.5* 10.4* 10.2* 10.2*  HCT 36.1 33.3* 33.1* 33.4*  PLT 169 156 130* 107*  MCV 97.6 99.7 100.3* 99.1  MCH 31.1 31.1 30.9 30.3  MCHC 31.9 31.2 30.8 30.5  RDW 15.6* 15.3 15.2 15.0  LYMPHSABS 0.7  --   --   --   MONOABS 0.7  --   --   --   EOSABS 0.1  --   --   --   BASOSABS 0.0  --   --   --     Chemistries  Recent Labs  Lab 06/09/19 1740 06/09/19 2318 06/10/19 1831 06/11/19 0923 06/12/19 0919 06/13/19 0536 06/14/19 0342  NA 128*   < > 132* 135 136 134* 135  K 7.2*   < > 6.1* 5.5* 4.7 4.4 5.1  CL 84*   < > 89* 90* 89* 89* 89*  CO2 34*   < > 33* 34* 34* 34* 35*  GLUCOSE 101*   < > 90 93 94 105* 102*  BUN 112*   < > 112* 116* 118* 121* 126*  CREATININE 6.12*   < > 5.89* 5.75* 5.62* 5.56* 5.73*  CALCIUM 8.8*   < > 8.8* 8.8* 8.8* 8.6* 8.5*  MG 2.9*  --   --   --   --   --   --   AST 32  --   --  22  --   --   --   ALT 21  --   --  17  --   --   --   ALKPHOS 135*  --   --  111  --   --   --   BILITOT 1.2  --   --  1.3*  --   --   --    < > = values in this interval not displayed.   ------------------------------------------------------------------------------------------------------------------ No results for  input(s): CHOL, HDL, LDLCALC, TRIG, CHOLHDL, LDLDIRECT in the last 72 hours.  Lab Results  Component Value Date   HGBA1C 6.2 06/16/2016   ------------------------------------------------------------------------------------------------------------------ No results for input(s): TSH, T4TOTAL, T3FREE, THYROIDAB in the last 72 hours.  Invalid  input(s): FREET3 ------------------------------------------------------------------------------------------------------------------ No results for input(s): VITAMINB12, FOLATE, FERRITIN, TIBC, IRON, RETICCTPCT in the last 72 hours.  Coagulation profile No results for input(s): INR, PROTIME in the last 168 hours.  No results for input(s): DDIMER in the last 72 hours.  Cardiac Enzymes No results for input(s): CKMB, TROPONINI, MYOGLOBIN in the last 168 hours.  Invalid input(s): CK ------------------------------------------------------------------------------------------------------------------    Component Value Date/Time   BNP 359.0 (H) 05/19/2019 1200    Shon Hale M.D on 06/14/2019 at 7:14 PM  Go to [email or web address] - for contact info  [facility] - Office  [phone]

## 2019-06-15 LAB — GLUCOSE, CAPILLARY
Glucose-Capillary: 90 mg/dL (ref 70–99)
Glucose-Capillary: 109 mg/dL — ABNORMAL HIGH (ref 70–99)

## 2019-06-15 LAB — BASIC METABOLIC PANEL
Anion gap: 13 (ref 5–15)
BUN: 126 mg/dL — ABNORMAL HIGH (ref 8–23)
CO2: 33 mmol/L — ABNORMAL HIGH (ref 22–32)
Calcium: 8.7 mg/dL — ABNORMAL LOW (ref 8.9–10.3)
Chloride: 89 mmol/L — ABNORMAL LOW (ref 98–111)
Creatinine, Ser: 6.4 mg/dL — ABNORMAL HIGH (ref 0.44–1.00)
GFR calc Af Amer: 6 mL/min — ABNORMAL LOW (ref >60)
GFR calc non Af Amer: 5 mL/min — ABNORMAL LOW (ref >60)
Glucose, Bld: 96 mg/dL (ref 70–99)
Potassium: 5.4 mmol/L — ABNORMAL HIGH (ref 3.5–5.1)
Sodium: 135 mmol/L (ref 135–145)

## 2019-06-15 LAB — CBC
HCT: 34.4 % — ABNORMAL LOW (ref 36.0–46.0)
Hemoglobin: 10.4 g/dL — ABNORMAL LOW (ref 12.0–15.0)
MCH: 30.7 pg (ref 26.0–34.0)
MCHC: 30.2 g/dL (ref 30.0–36.0)
MCV: 101.5 fL — ABNORMAL HIGH (ref 80.0–100.0)
Platelets: 84 10*3/uL — ABNORMAL LOW (ref 150–400)
RBC: 3.39 MIL/uL — ABNORMAL LOW (ref 3.87–5.11)
RDW: 15.4 % (ref 11.5–15.5)
WBC: 2.8 10*3/uL — ABNORMAL LOW (ref 4.0–10.5)
nRBC: 0 % (ref 0.0–0.2)

## 2019-06-15 LAB — SARS CORONAVIRUS 2 (TAT 6-24 HRS): SARS Coronavirus 2: NEGATIVE

## 2019-06-15 NOTE — Progress Notes (Signed)
Pharmacy Antibiotic Note  [name] is a 86 y.o. female admitted on 06/09/2019 with UTI.  Pharmacy was consulted for aztreonam dosing, but patient has tolerated Augmentin in the past, so will start Unasyn.  Urine culture grew E. Col and K. Pneumoniae sensitive to Unasyn. MD may possibly discharge  Plan: Continue Unasyn 3g IV q24h Pharmacy will continue to monitor renal function, cultures and patient progress.   Height: 5\' 2"  (157.5 cm) Weight: 179 lb 0.2 oz (81.2 kg) IBW/kg (Calculated) : 50.1  Temp (24hrs), Avg:97.3 F (36.3 C), Min:96.3 F (35.7 C), Max:97.7 F (36.5 C)  Recent Labs  Lab 06/09/19 1740 06/09/19 2318 06/10/19 0334 06/10/19 1310 06/11/19 0923 06/12/19 0919 06/13/19 0536 06/14/19 0342 06/15/19 0407  WBC 5.3  --  4.8  --  3.5*  --  3.4*  --  2.8*  CREATININE 6.12*   < > 5.96*   < > 5.75* 5.62* 5.56* 5.73* 6.40*   < > = values in this interval not displayed.    Estimated Creatinine Clearance: 6.3 mL/min (A) (by C-G formula based on SCr of 6.4 mg/dL (H)).    Allergies  Allergen Reactions  . Aspirin Nausea Only  . Codeine Nausea Only  . Sulfa Antibiotics   . Vicodin [Hydrocodone-Acetaminophen] Nausea Only  . Warfarin Sodium Other (See Comments)    Tingling all over  . Cephalexin Rash    Red, rash covering lower limbs.  . Doxycycline Rash    Antimicrobials this admission: aztreonam 3/10>>3/12 Unasyn 3/11>>     Dose adjustments this admission: Aztreonam, Unasyn  Microbiology results: 3/10 UCx:  E. coli, K. pneumoniae-->pan sensitive 3/2 Resp PCR: SARS CoV-2 negative; Flu A/B negative 3/10 MRSA PCR: negative  Thank you for allowing pharmacy to be a part of this patient's care.  5/10 06/15/2019 12:55 PM

## 2019-06-15 NOTE — Progress Notes (Signed)
Patient Demographics:    [name], is a 86 y.o. female, DOB - 09/19/1932, UJW:[id number]  Admit date - 06/09/2019   Admitting Physician Ejiroghene Wendall Stade, MD  Outpatient Primary MD for the patient is Raliegh Ip, DO  LOS - 6   Chief Complaint  Patient presents with  . Altered Mental Status  . hyperkalemia        Subjective:    [name] today has no fevers, no emesis,  No chest pain, resting comfortably  -Able to work with physical therapy -Hypoxia persist =-Oral intake is not great  Assessment  & Plan :    Principal Problem:   Acute metabolic encephalopathy Active Problems:   Goals of care, counseling/discussion   Palliative care encounter   AF (atrial fibrillation) (HCC)   Hyperkalemia   Chronic diastolic CHF (congestive heart failure) (HCC)   Acute kidney injury superimposed on CKD (HCC)   Acute renal failure (HCC)   Encounter for hospice care discussion  Brief Summary:- 86 y.o. female with medical history significant for atrial fibrillation, hypertension, congestive heart failure.  Admitted on 06/09/2019 with acute metabolic encephalopathy in the setting of renal failure with presumed uremia/azotemia and UTI -Awaiting SNF transfer    A/p 1)Social/Ethics-extensive conversation with the patient's son about goals of care, patient remaines hemodynamically unstable with low BP, patient remains encephalopathic -Patient son states that patient would not want aggressive interventions -Specifically no hemodialysis, no pressors, -Patient son requested that we do not escalate care at this time, patient son requests status quo for now.  He wants to see how his mom does over the next day or 2 prior to making further decisions -Patient remains a DNR/DNI --ABG with mostly "compensated" Hypercarpnia   2)Aki on CKD IIIb with Persistent Hyperkalemia--- potassium down to 5.4 from a  peak of 7.2 after hyperkalemia cocktail and Lokelma x3 doses -Baseline creatinine 1.2 just over a week PTA -Creatinine up to 6.4 -Discussed with Nephrology service patient appears somewhat volume overloaded, advised no further IV fluids -Renal ultrasound without obstructive uropathy -Keep Foley in for accurate ins and O -Patient son declines HD -Lisinopril discontinued  3)Acute Metabolic Encephalopathy--more awake, more interactive, overall still lethargic for long peers of time suspect uremia/azotemia related, gram-negative rod UTI is probably contributory as well ABG with mostly compensated hypercapnia, CT head without acute findings  4) E. coli and Klebsiella UTI---c/n  Unasyn (started 06/12/19) per sensitivity report, stopped  Aztreonam --patient with allergies to sulfa, cephalosporins and doxycycline -Plan to change to p.o. Augmentin on 06/15/2019  HTN--- c/n Hold Lisinopril due to hypotension and hyperkalemia  4)Large Pericardial Effusion--this is probably contributing to patient's hypotension  5)Permanent Atrial Fibrillation--- Eliquis on hold, patient is actually bradycardic without any AV nodal blocking agents  6)HFpEF/chronic diastolic dysfunction CHF----Lasix on hold due to low blood pressure and kidney concerns.  -Clinically somewhat volume overloaded but BP too low to allow for diuresis  7) chronic anemia--- suspect some degree of anemia of CKD, hemoglobin stable above 10  Disposition/Need for  in-Hospital Stay- patient unable to be discharged at this time due to --- worsening renal function, and IV antibiotics for E. coli and Klebsiella UTI --plan to switch to oral antibiotics on 06/15/2019 --Possible transfer to SNF with palliative care following on 06/15/2019 if son is agreeable pending further conversations with palliative care team  Code Status : Full code  Family Communication:  Discussed with son several times  - Antimicrobials this admission: aztreonam  3/10>>3/12 Unasyn 3/11>>    Microbiology/culture data --3/10 UCx:  E. coli, K. pneumoniae-->pan sensitive 3/2 Resp PCR: SARS CoV-2 negative; Flu A/B negative 3/10 MRSA PCR: negative  Consults  : Cardiology, palliative care and nephrology  DVT Prophylaxis  :    - Heparin - SCDs   Lab Results  Component Value Date   PLT 84 (L) 06/15/2019    Inpatient Medications  Scheduled Meds: . Chlorhexidine Gluconate Cloth  6 each Topical Daily  . heparin  5,000 Units Subcutaneous Q8H  . mouth rinse  15 mL Mouth Rinse BID  . promethazine  25 mg Rectal Once   Continuous Infusions: . ampicillin-sulbactam (UNASYN) IV 3 g (06/15/19 1026)   PRN Meds:.    Anti-infectives (From admission, onward)   Start     Dose/Rate Route Frequency Ordered Stop   06/13/19 0900  Ampicillin-Sulbactam (UNASYN) 3 g in sodium chloride 0.9 % 100 mL IVPB     3 g 200 mL/hr over 30 Minutes Intravenous Every 24 hours 06/12/19 0900     06/12/19 0900  Ampicillin-Sulbactam (UNASYN) 3 g in sodium chloride 0.9 % 100 mL IVPB     3 g 200 mL/hr over 30 Minutes Intravenous  Once 06/12/19 0803 06/12/19 1039   06/10/19 1400  aztreonam (AZACTAM) 1 g in sodium chloride 0.9 % 100 mL IVPB  Status:  Discontinued     1 g 200 mL/hr over 30 Minutes Intravenous Every 12 hours 06/10/19 1317 06/10/19 1319   06/10/19 1400  aztreonam (AZACTAM) 0.5 g in dextrose 5 % 50 mL IVPB  Status:  Discontinued     0.5 g 100 mL/hr over 30 Minutes Intravenous Every 8 hours 06/10/19 1320 06/12/19 0803        Objective:   Vitals:   06/15/19 1300 06/15/19 1400 06/15/19 1500 06/15/19 1635  BP: 102/60 (!) 89/48 (!) 96/48   Pulse: 60 (!) 45 (!) 57   Resp: 16 11 11    Temp:    (!) 97.5 F (36.4 C)  TempSrc:    Axillary  SpO2: 93% 96% 98%   Weight:      Height:        Wt Readings from Last 3 Encounters:  06/11/19 81.2 kg  06/01/19 74.7 kg  05/19/19 95.6 kg     Intake/Output Summary (Last 24 hours) at 06/15/2019 1817 Last data filed at  06/15/2019 1640 Gross per 24 hour  Intake --  Output 325 ml  Net -325 ml   Physical Exam  Gen:-Awake, cooperative and able to follow some commands  HEENT:- Kimberly.AT, No sclera icterus Nose- 2L/min Neck-Supple Neck, significant JVD elevation noted.  Lungs-diminished in bases without wheezing  CV- S1, S2 normal, irregular Abd-  +ve B.Sounds, Abd Soft, No tenderness,    Extremity/Skin:-  Trace edema, pedal pulses present , significant lower extremity varicosities Psych-less lethargic, somewhat disoriented Neuro-generalized weakness, no new focal deficits,  GU-Foley in situ   Data Review:   Micro Results Recent Results (from the past 240 hour(s))  MRSA PCR Screening     Status:  None   Collection Time: 06/10/19  5:03 AM   Specimen: Nasal Mucosa; Nasopharyngeal  Result Value Ref Range Status   MRSA by PCR NEGATIVE NEGATIVE Final    Comment:        The GeneXpert MRSA Assay (FDA approved for NASAL specimens only), is one component of a comprehensive MRSA colonization surveillance program. It is not intended to diagnose MRSA infection nor to guide or monitor treatment for MRSA infections. Performed at [facility], [address] [place], [place] [address]   Culture, Urine     Status: Abnormal   Collection Time: 06/10/19  5:18 AM   Specimen: Urine, Clean Catch  Result Value Ref Range Status   Specimen Description URINE, CLEAN CATCH  Final   Special Requests   Final    NONE Performed at [facility], [address] [place], [place] [address]    Culture (A)  Final    >=100,000 COLONIES/mL ESCHERICHIA COLI >=100,000 COLONIES/mL KLEBSIELLA PNEUMONIAE    Report Status 06/12/2019 FINAL  Final   Organism ID, Bacteria ESCHERICHIA COLI (A)  Final   Organism ID, Bacteria KLEBSIELLA PNEUMONIAE (A)  Final      Susceptibility   Escherichia coli - MIC*    AMPICILLIN 4 SENSITIVE Sensitive     CEFAZOLIN <=4 SENSITIVE Sensitive     CEFTRIAXONE <=0.25 SENSITIVE Sensitive      CIPROFLOXACIN <=0.25 SENSITIVE Sensitive     GENTAMICIN <=1 SENSITIVE Sensitive     IMIPENEM <=0.25 SENSITIVE Sensitive     NITROFURANTOIN <=16 SENSITIVE Sensitive     TRIMETH/SULFA <=20 SENSITIVE Sensitive     AMPICILLIN/SULBACTAM <=2 SENSITIVE Sensitive     PIP/TAZO <=4 SENSITIVE Sensitive     * >=100,000 COLONIES/mL ESCHERICHIA COLI   Klebsiella pneumoniae - MIC*    AMPICILLIN >=32 RESISTANT Resistant     CEFAZOLIN <=4 SENSITIVE Sensitive     CEFTRIAXONE <=0.25 SENSITIVE Sensitive     CIPROFLOXACIN <=0.25 SENSITIVE Sensitive     GENTAMICIN <=1 SENSITIVE Sensitive     IMIPENEM <=0.25 SENSITIVE Sensitive     NITROFURANTOIN 32 SENSITIVE Sensitive     TRIMETH/SULFA <=20 SENSITIVE Sensitive     AMPICILLIN/SULBACTAM 8 SENSITIVE Sensitive     PIP/TAZO <=4 SENSITIVE Sensitive     * >=100,000 COLONIES/mL KLEBSIELLA PNEUMONIAE  SARS CORONAVIRUS 2 (TAT 6-24 HRS) Nasopharyngeal Nasopharyngeal Swab     Status: None   Collection Time: 06/14/19  6:45 PM   Specimen: Nasopharyngeal Swab  Result Value Ref Range Status   SARS Coronavirus 2 NEGATIVE NEGATIVE Final    Comment: (NOTE) SARS-CoV-2 target nucleic acids are NOT DETECTED. The SARS-CoV-2 RNA is generally detectable in upper and lower respiratory specimens during the acute phase of infection. Negative results do not preclude SARS-CoV-2 infection, do not rule out co-infections with other pathogens, and should not be used as the sole basis for treatment or other patient management decisions. Negative results must be combined with clinical observations, patient history, and epidemiological information. The expected result is Negative. Fact Sheet for Patients: HairSlick.nohttps://www.fda.gov/media/138098/download Fact Sheet for Healthcare Providers: quierodirigir.comhttps://www.fda.gov/media/138095/download This test is not yet approved or cleared by the [place] FDA and  has been authorized for detection and/or diagnosis of SARS-CoV-2 by FDA under an Emergency  Use Authorization (EUA). This EUA will remain  in effect (meaning this test can be used) for the duration of the COVID-19 declaration under Section 56 4(b)(1) of the Act, 21 U.S.C. section 360bbb-3(b)(1), unless the authorization is terminated or revoked sooner. Performed at [facility], [address] [place], [place] [address]     Radiology Reports CT HEAD WO CONTRAST  Result Date: 06/09/2019 CLINICAL DATA:  86 year old female with altered mental status common cephalopathy. EXAM: CT HEAD WITHOUT CONTRAST TECHNIQUE: Contiguous axial images were obtained from the base of the skull through the vertex without intravenous contrast. COMPARISON:  Brain MRI 02/25/2018. Head CT 05/24/2019. FINDINGS: Brain: Perivascular space at the inferior left basal ganglia again noted (normal variant). Cerebral volume is within normal limits for age. No midline shift, ventriculomegaly, mass effect, evidence of mass lesion, intracranial hemorrhage or evidence of cortically based acute infarction. Gray-white matter differentiation appears stable and within normal limits for age. Vascular: Calcified atherosclerosis at the skull base. No suspicious intracranial vascular hyperdensity. Skull: No acute osseous abnormality identified. Sinuses/Orbits: Visualized paranasal sinuses and mastoids are stable and well pneumatized. Other: No acute orbit or scalp soft tissue finding. IMPRESSION: Non contrast CT appearance of the brain remains stable since last month and within normal limits for age. Electronically Signed   By: Odessa Fleming M.D.   On: 06/09/2019 22:41   CT HEAD WO CONTRAST  Result Date: 05/24/2019 CLINICAL DATA:  86 year old female with altered mental status. Admitted with respiratory failure. EXAM: CT HEAD WITHOUT CONTRAST TECHNIQUE: Contiguous axial images were obtained from the base of the skull through the vertex without intravenous contrast. COMPARISON:  Brain MRI 02/25/2018. FINDINGS: Brain: Cerebral volume is  stable and within normal limits for age. There is a chronic perivascular space at the inferior left lentiform (normal variant). No midline shift, ventriculomegaly, mass effect, evidence of mass lesion, intracranial hemorrhage or evidence of cortically based acute infarction. Normal for age gray-white matter differentiation. Vascular: Calcified atherosclerosis at the skull base. Skull: No acute osseous abnormality identified. Sinuses/Orbits: Visualized paranasal sinuses and mastoids are stable and well pneumatized. Other: No acute orbit or scalp soft tissue finding. Scalp vessels are prominent diffusely, although unchanged from the prior MRI. IMPRESSION: Normal for age non contrast CT appearance of the brain. Electronically Signed   By: Odessa Fleming M.D.   On: 05/24/2019 18:19   US RENAL  Result Date: 06/10/2019 CLINICAL DATA:  Acute renal injury EXAM: RENAL / URINARY TRACT ULTRASOUND COMPLETE COMPARISON:  04/10/2017 FINDINGS: Right Kidney: Renal measurements: 12.5 x 4.4 x 5.0 cm. = volume: 145 mL. No mass lesion or hydronephrosis is noted. Mild increased echogenicity is seen. Left Kidney: Renal measurements: 12.2 x 5.7 x 6.0 cm = volume: 217 mL. Mild increased echogenicity is noted. Scattered cysts are noted throughout the left kidney. The largest of these measures 2.3 cm and is simple in nature. The cysts are all clustered in the midportion of the kidney. No obstructive changes are seen. Bladder: Decompressed Other: None. IMPRESSION: Increased echogenicity consistent with medical renal disease. Left renal cysts. Electronically Signed   By: Alcide Clever M.D.   On: 06/10/2019 15:20   DG CHEST PORT 1 VIEW  Result Date: 05/27/2019 CLINICAL DATA:  Bilateral pleural effusion and pericardial effusion, negative COVID-19 testing 8 days prior EXAM: PORTABLE CHEST 1 VIEW COMPARISON:  Radiograph 05/24/2019 FINDINGS: Diminishing lung volumes with persistent bilateral interstitial and airspace opacities, slightly increasing  in the right upper and lower lung though this may be attributable to the lower volumes. Stable cardiomegaly. Bilateral effusions. No pneumothorax. No acute osseous or soft tissue abnormality. Telemetry leads overlie the chest. IMPRESSION: Diminishing lung volumes with persistent bilateral interstitial and airspace opacities, slightly increasing in the right upper and lower lung though this may  be attributable to lower volumes. Bilateral effusions. Electronically Signed   By: Kreg Shropshire M.D.   On: 05/27/2019 05:58   DG CHEST PORT 1 VIEW  Result Date: 05/24/2019 CLINICAL DATA:  Acute on chronic respiratory failure. EXAM: PORTABLE CHEST 1 VIEW COMPARISON:  05/19/2019 FINDINGS: Patient is rotated to the right. There is mild motion artifact. Lungs are adequately inflated with interval worsening bibasilar opacification likely moderate bilateral effusions with associated bibasilar atelectasis. Infection in the mid to lower lungs is possible. Stable cardiomegaly with mild prominence of the perihilar markings suggesting mild vascular congestion. Remainder the exam is unchanged. IMPRESSION: Mild cardiomegaly with mild vascular congestion. Worsening bilateral moderate pleural effusions likely with associated bibasilar atelectasis. Infection in the lung bases is possible. Electronically Signed   By: Elberta Fortis M.D.   On: 05/24/2019 10:40   DG Chest Port 1 View  Result Date: 05/19/2019 CLINICAL DATA:  Decreased oxygen saturation today. Lower extremity swelling. EXAM: PORTABLE CHEST 1 VIEW COMPARISON:  PA and lateral chest 05/28/2017. FINDINGS: There is massive cardiomegaly. Small to moderate bilateral pleural effusions are seen, larger on the right. Pulmonary edema and basilar atelectasis are seen. No pneumothorax. No acute bony abnormality. Avascular necrosis of the humeral heads is noted. IMPRESSION: Cardiomegaly with interstitial pulmonary edema and small to moderate pleural effusions, larger on the right.  Electronically Signed   By: Drusilla Kanner M.D.   On: 05/19/2019 12:25   ECHOCARDIOGRAM COMPLETE  Result Date: 05/20/2019    ECHOCARDIOGRAM REPORT   Patient Name:   VANNAH NADAL Date of Exam: 05/20/2019 Medical Rec #:  [id number]      Height:       64.0 in Accession #:    [id number]     Weight:       208.8 lb Date of Birth:  May 05, 1932      BSA:          1.99 m Patient Age:    86 years       BP:           100/52 mmHg Patient Gender: F              HR:           73 bpm. [facility] Procedure: 2D Echo Indications:    CHF-Acute Diastolic 428.31 / I50.31  History:        Patient has prior history of Echocardiogram examinations, most                 recent 03/16/2015. Arrythmias:Atrial Fibrillation; Risk                 Factors:Dyslipidemia, Hypertension and Non-Smoker. Acute                 hypoxemic respiratory failure , First degree atrioventricular                 block, GERD, Non-rheumatic mitral regurgitation.  Sonographer:    Jeryl Columbia RDCS (AE) Referring Phys: [phone] Lamont Dowdy [facility] IMPRESSIONS  1. Left ventricular ejection fraction, by estimation, is 60 to 65%. The left ventricle has normal function. The left ventricle has no regional wall motion abnormalities. There is mild concentric left ventricular hypertrophy. Left ventricular diastolic parameters are indeterminate. Elevated left ventricular end-diastolic pressure.  2. Right ventricular systolic function is normal. The right ventricular size is mildly enlarged. There is severely elevated pulmonary artery systolic pressure.  3. Left atrial size was severely dilated.  4. Right  atrial size was severely dilated.  5. IVC collapses. No RA or RV collapse. No signs of tamponade. Large pericardial effusion. The pericardial effusion is circumferential.  6. The mitral valve is grossly normal. Mild mitral valve regurgitation.  7. Tricuspid valve regurgitation is moderate.  8. The aortic valve is tricuspid. Aortic valve regurgitation is mild.  Mild aortic valve stenosis. Aortic valve area, by VTI measures 1.57 cm. Aortic valve mean gradient measures 17.0 mmHg. Aortic valve Vmax measures 2.79 m/s.  9. The inferior vena cava is dilated in size with >50% respiratory variability, suggesting right atrial pressure of 8 mmHg. FINDINGS  Left Ventricle: Left ventricular ejection fraction, by estimation, is 60 to 65%. The left ventricle has normal function. The left ventricle has no regional wall motion abnormalities. The left ventricular internal cavity size was normal in size. There is  mild concentric left ventricular hypertrophy. Left ventricular diastolic parameters are indeterminate. Elevated left ventricular end-diastolic pressure. Right Ventricle: The right ventricular size is mildly enlarged. No increase in right ventricular wall thickness. Right ventricular systolic function is normal. There is severely elevated pulmonary artery systolic pressure. The tricuspid regurgitant velocity is 4.02 m/s, and with an assumed right atrial pressure of 10 mmHg, the estimated right ventricular systolic pressure is 74.6 mmHg. Left Atrium: Left atrial size was severely dilated. Right Atrium: Right atrial size was severely dilated. Pericardium: IVC collapses. No RA or RV collapse. No signs of tamponade. A large pericardial effusion is present. The pericardial effusion is circumferential. Mitral Valve: The mitral valve is grossly normal. Mild mitral valve regurgitation. Tricuspid Valve: The tricuspid valve is grossly normal. Tricuspid valve regurgitation is moderate. Aortic Valve: The aortic valve is tricuspid. Aortic valve regurgitation is mild. Aortic regurgitation PHT measures 540 msec. Mild aortic stenosis is present. Moderate aortic valve annular calcification. There is mild calcification of the aortic valve. Aortic valve mean gradient measures 17.0 mmHg. Aortic valve peak gradient measures 31.1 mmHg. Aortic valve area, by VTI measures 1.57 cm. Pulmonic Valve: The  pulmonic valve was grossly normal. Pulmonic valve regurgitation is not visualized. Aorta: The aortic root is normal in size and structure. Venous: The inferior vena cava is dilated in size with greater than 50% respiratory variability, suggesting right atrial pressure of 8 mmHg. IAS/Shunts: No atrial level shunt detected by color flow Doppler.  LEFT VENTRICLE PLAX 2D LVIDd:         5.12 cm  Diastology LVIDs:         3.07 cm  LV e' lateral:   8.81 cm/s LV PW:         1.34 cm  LV E/e' lateral: 15.3 LV IVS:        1.15 cm  LV e' medial:    6.53 cm/s LVOT diam:     1.80 cm  LV E/e' medial:  20.7 LV SV:         91.86 ml LV SV Index:   41.66 LVOT Area:     2.54 cm  LEFT ATRIUM              Index       RIGHT ATRIUM           Index LA diam:        4.90 cm  2.46 cm/m  RA Area:     38.50 cm LA Vol (A2C):   186.0 ml 93.35 ml/m RA Volume:   166.00 ml 83.32 ml/m LA Vol (A4C):   160.0 ml 80.30 ml/m LA Biplane Vol: 173.0  ml 86.83 ml/m  AORTIC VALVE AV Area (Vmax):    1.62 cm AV Area (Vmean):   1.49 cm AV Area (VTI):     1.57 cm AV Vmax:           278.67 cm/s AV Vmean:          192.333 cm/s AV VTI:            0.584 m AV Peak Grad:      31.1 mmHg AV Mean Grad:      17.0 mmHg LVOT Vmax:         177.33 cm/s LVOT Vmean:        112.667 cm/s LVOT VTI:          0.361 m LVOT/AV VTI ratio: 0.62 AI PHT:            540 msec  AORTA Ao Root diam: 2.80 cm MITRAL VALVE                TRICUSPID VALVE MV Area (PHT): 2.42 cm     TR Peak grad:   64.6 mmHg MV Decel Time: 313 msec     TR Vmax:        402.00 cm/s MR Peak grad: 63.0 mmHg MR Mean grad: 45.0 mmHg     SHUNTS MR Vmax:      397.00 cm/s   Systemic VTI:  0.36 m MR Vmean:     320.0 cm/s    Systemic Diam: 1.80 cm MV E velocity: 135.00 cm/s MV A velocity: 39.40 cm/s MV E/A ratio:  3.43 Prentice Docker MD Electronically signed by Prentice Docker MD Signature Date/Time: 05/20/2019/2:55:41 PM    Final    ECHOCARDIOGRAM LIMITED  Result Date: 05/23/2019    ECHOCARDIOGRAM LIMITED  REPORT   Patient Name:   MACKENZE GRANDISON Date of Exam: 05/23/2019 Medical Rec #:  [id number]      Height:       64.0 in Accession #:    [id number]     Weight:       195.1 lb Date of Birth:  [birth year]      BSA:          1.936 m Patient Age:    86 years       BP:           129/70 mmHg Patient Gender: F              HR:           78 bpm. [facility] Procedure: Limited Echo and Cardiac Doppler Indications:    Pericardial Effusion 423.9 / l31.3  History:        Patient has prior history of Echocardiogram examinations, most                 recent 05/20/2019. CHF, Arrythmias:Atrial Fibrillation; Risk                 Factors:Hypertension and Dyslipidemia. Non-Smoker. Acute                 hypoxemic respiratory failure , First degree atrioventricular                 block, GERD, Non-rheumatic mitral regurgitation.  Sonographer:    Celesta Gentile RCS Referring Phys: [phone] [facility] IMPRESSIONS  1. Limited study.  2. Large pericardial effusion, circumferential but to greater degree posterior and lateral. Dimensions are similar to previous study on February 17. No obvious significant respiratory  variation in mitral outflow to suggest tamponade physiology. There is  no compression of RA or RV either, but evidence of significant pulmonary hypertension (last study RVSP was approximately 75 mmHg) and this could be inhibiting compression of RV chamber. Organizing material adherent to pericardium suggests possible chronicity, particularly given effusion size.  3. Left ventricular ejection fraction, by estimation, is 60 to 65%. The left ventricle has normal function. The left ventricle has no regional wall motion abnormalities. There is the interventricular septum is flattened in systole and diastole, consistent with right ventricular pressure and volume overload.  4. The right ventricular size is moderately enlarged.  5. Left atrial size was severely dilated.  6. Right atrial size was severely dilated.  7. The  mitral valve is grossly normal. FINDINGS  Left Ventricle: Left ventricular ejection fraction, by estimation, is 60 to 65%. The left ventricle has normal function. The left ventricle has no regional wall motion abnormalities. The interventricular septum is flattened in systole and diastole, consistent with right ventricular pressure and volume overload. Right Ventricle: The right ventricular size is moderately enlarged. Left Atrium: Left atrial size was severely dilated. Right Atrium: Right atrial size was severely dilated. Pericardium: Dimensions are similar to previous study on February 17. No obvious significant respiratory variation in mitral outflow to suggest tamponade physiology. There is no compression of RA or RV either, but evidence of significant pulmonary hypertension (last study RVSP was approximately 75 mmHg) and this could be inhibiting compression of RV chamber. Organizing material adherent to pericardium suggests possible chronicity, particularly given effusion size. A large pericardial effusion is present. The pericardial effusion is circumferential. Mitral Valve: The mitral valve is grossly normal. Mild to moderate mitral annular calcification. Additional Comments: There is pleural effusion in the left lateral region. Rozann Lesches MD Electronically signed by Rozann Lesches MD Signature Date/Time: 05/23/2019/2:09:38 PM    Final    DG ESOPHAGUS W SINGLE CM (SOL OR THIN BA)  Result Date: 05/22/2019 CLINICAL DATA:  DYSPHAGIA. EXAM: ESOPHOGRAM/BARIUM SWALLOW TECHNIQUE: Single contrast examination was performed using thin barium or water soluble. FLUOROSCOPY TIME:  Fluoroscopy Time: 2 minutes 18 seconds Radiation Exposure Index (if provided by the fluoroscopic device): 38.6 mGy COMPARISON:  None. FINDINGS: The patient ingested thin barium in a semi upright position. The patient has poor esophageal peristalsis with persistent spasm in the distal esophagus consistent with achalasia. I placed the  patient in an almost upright position and only a small amount of barium trickled into the nondistended stomach. IMPRESSION: Achalasia. Minimal emptying of the dilated esophagus. Minimal peristalsis in the esophagus. The patient tolerated the procedure well. No immediate complications. Electronically Signed   By: Lorriane Shire M.D.   On: 05/22/2019 11:18    CBC Recent Labs  Lab 06/09/19 1740 06/10/19 0334 06/11/19 0923 06/13/19 0536 06/15/19 0407  WBC 5.3 4.8 3.5* 3.4* 2.8*  HGB 11.5* 10.4* 10.2* 10.2* 10.4*  HCT 36.1 33.3* 33.1* 33.4* 34.4*  PLT 169 156 130* 107* 84*  MCV 97.6 99.7 100.3* 99.1 101.5*  MCH 31.1 31.1 30.9 30.3 30.7  MCHC 31.9 31.2 30.8 30.5 30.2  RDW 15.6* 15.3 15.2 15.0 15.4  LYMPHSABS 0.7  --   --   --   --   MONOABS 0.7  --   --   --   --   EOSABS 0.1  --   --   --   --   BASOSABS 0.0  --   --   --   --  Chemistries  Recent Labs  Lab 06/09/19 1740 06/09/19 2318 06/11/19 0923 06/12/19 0919 06/13/19 0536 06/14/19 0342 06/15/19 0407  NA 128*   < > 135 136 134* 135 135  K 7.2*   < > 5.5* 4.7 4.4 5.1 5.4*  CL 84*   < > 90* 89* 89* 89* 89*  CO2 34*   < > 34* 34* 34* 35* 33*  GLUCOSE 101*   < > 93 94 105* 102* 96  BUN 112*   < > 116* 118* 121* 126* 126*  CREATININE 6.12*   < > 5.75* 5.62* 5.56* 5.73* 6.40*  CALCIUM 8.8*   < > 8.8* 8.8* 8.6* 8.5* 8.7*  MG 2.9*  --   --   --   --   --   --   AST 32  --  22  --   --   --   --   ALT 21  --  17  --   --   --   --   ALKPHOS 135*  --  111  --   --   --   --   BILITOT 1.2  --  1.3*  --   --   --   --    < > = values in this interval not displayed.   ------------------------------------------------------------------------------------------------------------------ No results for input(s): CHOL, HDL, LDLCALC, TRIG, CHOLHDL, LDLDIRECT in the last 72 hours.  Lab Results  Component Value Date   HGBA1C 6.2 06/16/2016    ------------------------------------------------------------------------------------------------------------------ No results for input(s): TSH, T4TOTAL, T3FREE, THYROIDAB in the last 72 hours.  Invalid input(s): FREET3 ------------------------------------------------------------------------------------------------------------------ No results for input(s): VITAMINB12, FOLATE, FERRITIN, TIBC, IRON, RETICCTPCT in the last 72 hours.  Coagulation profile No results for input(s): INR, PROTIME in the last 168 hours.  No results for input(s): DDIMER in the last 72 hours.  Cardiac Enzymes No results for input(s): CKMB, TROPONINI, MYOGLOBIN in the last 168 hours.  Invalid input(s): CK ------------------------------------------------------------------------------------------------------------------    Component Value Date/Time   BNP 359.0 (H) 05/19/2019 1200    Shon Hale M.D on 06/15/2019 at 6:17 PM  Go to [email or web address] - for contact info  [facility] - Office  [phone]

## 2019-06-15 NOTE — Plan of Care (Signed)
  Problem: Acute Rehab PT Goals(only PT should resolve) Goal: Pt Will Go Supine/Side To Sit Outcome: Progressing Flowsheets (Taken 06/15/2019 1440) Pt will go Supine/Side to Sit:  with min guard assist  with minimal assist Goal: Patient Will Transfer Sit To/From Stand Outcome: Progressing Flowsheets (Taken 06/15/2019 1440) Patient will transfer sit to/from stand:  with min guard assist  with minimal assist Goal: Pt Will Transfer Bed To Chair/Chair To Bed Outcome: Progressing Flowsheets (Taken 06/15/2019 1440) Pt will Transfer Bed to Chair/Chair to Bed:  with min assist  min guard assist Goal: Pt Will Ambulate Outcome: Progressing Flowsheets (Taken 06/15/2019 1440) Pt will Ambulate:  25 feet  with minimal assist  with rolling walker  with moderate assist   2:41 PM, 06/15/19 Ocie Bob, MPT Physical Therapist with [facility] [phone] office [phone] mobile phone

## 2019-06-15 NOTE — Evaluation (Signed)
Physical Therapy Evaluation Patient Details Name: [name] MRN: [id number] DOB: 12-Feb-1933 Today's Date: 06/15/2019   History of Present Illness  [name] is a 86 y.o. female with medical history significant for atrial fibrillation, hypertension, congestive heart failure.Patient was brought to the ED today from nursing home reports of altered mental status, staff at nursing reported that patient had decreased level of consciousness today, usually alert and oriented.  Blood work done today at this nursing home showed potassium of 7.3, and creatinine of 5.78.    Clinical Impression  Patient limited for functional mobility as stated below secondary to BLE weakness, fatigue and poor standing balance.  Patient limited to a few steps at bedside with SpO2 dropping from 93% to 88% with exertion while on 2 LPM O2.  Patient tolerated sitting up in chair after therapy - nursing staff aware. Patient will benefit from continued physical therapy in hospital and recommended venue below to increase strength, balance, endurance for safe ADLs and gait.     Follow Up Recommendations SNF    Equipment Recommendations  None recommended by PT    Recommendations for Other Services       Precautions / Restrictions Precautions Precautions: Fall Restrictions Weight Bearing Restrictions: No      Mobility  Bed Mobility Overal bed mobility: Needs Assistance Bed Mobility: Supine to Sit     Supine to sit: Min assist;Mod assist     General bed mobility comments: increased time, labored movement  Transfers Overall transfer level: Needs assistance Equipment used: Rolling walker (2 wheeled) Transfers: Sit to/from [employer] Sit to Stand: Min assist;Mod assist Stand pivot transfers: Mod assist       General transfer comment: slow labored movement  Ambulation/Gait Ambulation/Gait assistance: Mod assist Gait Distance (Feet): 5 Feet Assistive device: Rolling walker (2  wheeled) Gait Pattern/deviations: Decreased step length - right;Decreased step length - left;Decreased stride length Gait velocity: slow   General Gait Details: limited to 5-6 slow labored steps at bedside with SpO2 dropping below 90% on exertion while on 2 LPM O2  Stairs            Wheelchair Mobility    Modified Rankin (Stroke Patients Only)       Balance Overall balance assessment: Needs assistance Sitting-balance support: Feet supported;No upper extremity supported Sitting balance-[name] Scale: Fair Sitting balance - Comments: seated EOB   Standing balance support: During functional activity;Bilateral upper extremity supported Standing balance-[name] Scale: Poor Standing balance comment: fair/ poor using RW                             Pertinent Vitals/Pain Pain Assessment: No/denies pain    Home Living Family/patient expects to be discharged to:: Private residence Living Arrangements: Alone Available Help at Discharge: Family;Available PRN/intermittently Type of Home: House Home Access: Ramped entrance     Home Layout: One level Home Equipment: Cane - single point      Prior Function Level of Independence: Needs assistance   Gait / Transfers Assistance Needed: patient states community ambulator with SPC, drives  ADL's / Homemaking Assistance Needed: Family        Hand Dominance        Extremity/Trunk Assessment   Upper Extremity Assessment Upper Extremity Assessment: Generalized weakness    Lower Extremity Assessment Lower Extremity Assessment: Generalized weakness    Cervical / Trunk Assessment Cervical / Trunk Assessment: Normal  Communication   Communication: HOH  Cognition Arousal/Alertness:  Awake/alert Behavior During Therapy: WFL for tasks assessed/performed;Flat affect Overall Cognitive Status: No family/caregiver present to determine baseline cognitive functioning                                         General Comments      Exercises     Assessment/Plan    PT Assessment Patient needs continued PT services  PT Problem List Decreased strength;Decreased activity tolerance;Decreased balance;Decreased mobility;Cardiopulmonary status limiting activity       PT Treatment Interventions DME instruction;Therapeutic exercise;Balance training;Gait training;Functional mobility training;Therapeutic activities;Patient/family education    PT Goals (Current goals can be found in the Care Plan section)  Acute Rehab PT Goals Patient Stated Goal: return home PT Goal Formulation: With patient Time For Goal Achievement: 06/29/19 Potential to Achieve Goals: Fair    Frequency Min 3X/week   Barriers to discharge        Co-evaluation               AM-PAC PT "6 Clicks" Mobility  Outcome Measure Help needed turning from your back to your side while in a flat bed without using bedrails?: A Little Help needed moving from lying on your back to sitting on the side of a flat bed without using bedrails?: A Little Help needed moving to and from a bed to a chair (including a wheelchair)?: A Lot Help needed standing up from a chair using your arms (e.g., wheelchair or bedside chair)?: A Lot Help needed to walk in hospital room?: A Lot Help needed climbing 3-5 steps with a railing? : A Lot 6 Click Score: 14    End of Session Equipment Utilized During Treatment: Oxygen Activity Tolerance: Patient tolerated treatment well;Patient limited by fatigue Patient left: in chair;with call bell/phone within reach;with chair alarm set Nurse Communication: Mobility status PT Visit Diagnosis: Unsteadiness on feet (R26.81);Other abnormalities of gait and mobility (R26.89);Muscle weakness (generalized) (M62.81)    Time: 7124-5809 PT Time Calculation (min) (ACUTE ONLY): 25 min   Charges:   PT Evaluation $PT Eval Moderate Complexity: 1 Mod PT Treatments $Therapeutic Activity: 23-37 mins        2:38 PM,  06/15/19 Lonell Grandchild, MPT Physical Therapist with [facility] [phone] office [phone] mobile phone

## 2019-06-15 NOTE — Progress Notes (Signed)
Palliative: Mrs. Nevitt is resting quietly in bed.  She appears chronically ill and frail.  She is alert, and oriented x3, although it takes her several moments to answer questions.  She denies pain at this time, and is able to make her basic needs known.  There is no family at bedside at this time.  We talked about her acute health concerns.  I asked Mrs. Carolan if she is okay returning to rehab, and she does not answer.  I share that I am concerned about her ability to care for herself at home, stating that I do not think she can be alone at home.  She agrees.  Call to son, Candiss Galeana.  We talk about Mrs. Zelenak's acute and chronic health issues.  He shares that fundamentally her health has not changed, she continues to have fluid around her heart that is affecting her blood pressure and heart rate.  I agree, sharing that she also continues to have kidney failure.  Ethelene Browns states he visited yesterday for about one hour, and Mrs. Syler was able to self feed, communicate with him.  He shared that she told him she was very tired, and it wore her out even to eat.  We talk about what is next.  I share that although Mrs. Devaul did not say that she wanted to go back to rehab, she does acknowledge that she is unable to care for herself at home.  Ethelene Browns states that he feels she needs to return to rehab.  I encouraged him to greatly to work with Child psychotherapist on site, to know their name/phone number, start working on discharge plan.  We talked about discharge with outpatient palliative services.  At this point Ethelene Browns is reluctant to start outpatient palliative services.  I reassure him that he can start the services through the social worker onsite.   Ethelene Browns asks about her vital signs, we review some vital signs and a few labs.  We talked about rehab, that they will encourage Mrs. Priore to work as much as she can to rehab.  We talked about 20 days at no cost and then likely into co-pay days.  Again, I  encouraged him to work closely with social worker related to charges for rehab.      We talked about preferred place of death.  Ethelene Browns states that his mother has talked about funeral arrangements, but not preferred place of death.  He states that he feels Mrs. Boulos is accepting that her health is failing, and that she would not want extraordinary measures.  We talked about rehospitalization's, and the benefits of being active with palliative service so that if she does not want to rehospitalize she could be transferred to hospice care.  Conference with attending, bedside nursing staff and transition of care team related to patient condition, needs, goals of care, disposition.  Plan: Continue to treat the treatable but no CPR, no intubation.  No hemodialysis.  No escalation of care.  Rehospitalize as needed at this point.  Not agreeable to outpatient palliative at this time.  40 minutes Lillia Carmel, NP Palliative Medicine Team Team Phone # [phone] Greater than 50% of this time was spent counseling and coordinating care related to the above assessment and plan.

## 2019-06-16 LAB — BASIC METABOLIC PANEL
Anion gap: 14 (ref 5–15)
BUN: 140 mg/dL — ABNORMAL HIGH (ref 8–23)
CO2: 32 mmol/L (ref 22–32)
Calcium: 8.7 mg/dL — ABNORMAL LOW (ref 8.9–10.3)
Chloride: 89 mmol/L — ABNORMAL LOW (ref 98–111)
Creatinine, Ser: 6.48 mg/dL — ABNORMAL HIGH (ref 0.44–1.00)
GFR calc Af Amer: 6 mL/min — ABNORMAL LOW (ref >60)
GFR calc non Af Amer: 5 mL/min — ABNORMAL LOW (ref >60)
Glucose, Bld: 97 mg/dL (ref 70–99)
Potassium: 5.7 mmol/L — ABNORMAL HIGH (ref 3.5–5.1)
Sodium: 135 mmol/L (ref 135–145)

## 2019-06-16 LAB — GLUCOSE, CAPILLARY
Glucose-Capillary: 95 mg/dL (ref 70–99)
Glucose-Capillary: 94 mg/dL (ref 70–99)
Glucose-Capillary: 114 mg/dL — ABNORMAL HIGH (ref 70–99)

## 2019-06-16 MED ORDER — SODIUM POLYSTYRENE SULFONATE 15 GM/60ML PO SUSP: 15.0000 g | ORAL | 0 refills | Status: AC

## 2019-06-16 MED ORDER — ONDANSETRON 4 MG PO TBDP: 4.0000 mg | ORAL_TABLET | Freq: Three times a day (TID) | ORAL | 0 refills | Status: AC | PRN

## 2019-06-16 MED ORDER — SENNOSIDES-DOCUSATE SODIUM 8.6-50 MG PO TABS: 2.0000 | ORAL_TABLET | Freq: Every day | ORAL | 1 refills | Status: AC

## 2019-06-16 MED ORDER — CALCIUM CARBONATE ANTACID 500 MG PO CHEW: 1.0000 | CHEWABLE_TABLET | Freq: Three times a day (TID) | ORAL | Status: DC | PRN

## 2019-06-16 MED ORDER — AMOXICILLIN-POT CLAVULANATE 875-125 MG PO TABS: 1.0000 | ORAL_TABLET | Freq: Every day | ORAL | 0 refills | Status: AC

## 2019-06-16 MED ORDER — RANITIDINE HCL 300 MG PO TABS: 300.0000 mg | ORAL_TABLET | Freq: Every day | ORAL | 1 refills | Status: AC

## 2019-06-16 MED ORDER — SODIUM POLYSTYRENE SULFONATE 15 GM/60ML PO SUSP
30.0000 g | Freq: Once | ORAL | Status: AC
  Administered 2019-06-16: 30 g via ORAL
  Filled 2019-06-16: qty 120

## 2019-06-16 NOTE — TOC Transition Note (Signed)
Transition of Care [facility]) - CM/SW Discharge Note   Patient Details  Name: [name] MRN: [id number] Date of Birth: [birth year]  Transition of Care [facility]) CM/SW Contact:  Chaska Hagger, Chrystine Oiler, RN Phone Number: 06/16/2019, 3:25 PM   Clinical Narrative:   Patient discharging back to [facility] today. Discussed with Teodoro Kil, she will notify hospice for surveillance.  Will arrange EMS transport.  Final next level of care: Skilled Nursing Facility Barriers to Discharge: Barriers Resolved     Discharge Placement              Patient chooses bed at: Other - please specify in the comment section below:(returning to [facility]) Patient to be transferred to facility by: EMS Name of family member notified: Ethelene Browns Patient and family notified of of transfer: 06/16/19    Readmission Risk Interventions Readmission Risk Prevention Plan 06/02/2019  Transportation Screening Complete  Medication Review (RN CM) Complete  Some recent data might be hidden

## 2019-06-16 NOTE — Progress Notes (Addendum)
Palliative: Mrs. Stay is sitting up in the [place] chair in her room.  Physical therapy is at bedside assisting.  Mrs. Furgeson is very hard of hearing.  She is able to tell me her name and where we are, I do not ask for the date.  She continues to look weak and frail, but is able to make her basic needs known.  There is no family at bedside at this time.  Mrs. Luster and I talked about her current health concerns.  I share that we are trying to get her ready to go back to rehab.  I share that she has been sick with your kidneys and infection, and Mrs. Lose replies, "again?".  I encouraged her to do the best that she can.  Conference with attending who was talking to son Ethelene Browns on the telephone.  I joined the conversation.  Ethelene Browns understands that his mother but will be discharging back to [facility] today.  He states that he would like to come and see his mother before she discharges since he will be unable to visit [facility].  He states that he would like to visit with palliative medicine team at bedside.  I returned to Mrs. Mccard's room later in the day to find her son, Ethelene Browns, at bedside.  We talk in detail about Mrs. Mendonsa's swallowing issues, her worsening kidney function, her rehab potential.  Ethelene Browns and I talked about disposition.  At this point Mrs. Whitner will discharge to [facility] to attempt rehab, but it is anticipated that sometime within the next week or 2 she will discharged to [facility] house for hospice care with hosp[facility] of [facility]  We talked about rehospitalization's, but Ethelene Browns declines to complete MOLST form at this point.  I encouraged him to work closely with Child psychotherapist at [facility] for discharge planning.  We talked about what is and is not provided with hospice.  We talked about service providers, and Ethelene Browns chooses hospice of [place].  We gently talked about prognosis with permission.  Weeks anticipated.  Conference with attending, bedside nursing staff,  transition of care team related to patient condition, needs, goals of care, disposition options to rehab and returning to [facility]'s home with [facility].  Plan: Treat the treatable but no CPR, no intubation, no hemodialysis.  To [facility] SN[facility] for rehab.  Discharged to son Anthony's home with hosp[facility] of [facility].  Prognosis: 2 weeks or less would not be surprising based on worsening kidney function, hyperkalemia, patient and family declines hemodialysis.  65 minutes, extended time Lillia Carmel, NP Palliative Medicine Team Team Phone # [phone] Greater than 50% of this time was spent counseling and coordinating care related to the above assessment and plan.

## 2019-06-16 NOTE — Discharge Summary (Addendum)
[name], is a 86 y.o. female  DOB 1932-04-16  MRN [id number].  Admission date:  06/09/2019  Admitting Physician  Onnie Boer, MD  Discharge Date:  06/16/2019   Primary MD  Raliegh Ip, DO  Recommendations for primary care physician for things to follow:   --Avoid ibuprofen/Advil/Aleve/Motrin/Goody Powders/Naproxen/BC powders/Meloxicam/Diclofenac/Indomethacin and other Nonsteroidal anti-inflammatory medications as these will make you more likely to bleed and can cause stomach ulcers, can also cause Kidney problems.  ---  Palliative care consult please  - hospice of [facility] to follow post discharge from SNF rehab   Admission Diagnosis  Hyperkalemia [E87.5] Acute renal failure, unspecified acute renal failure type (HCC) [N17.9]   Discharge Diagnosis  Hyperkalemia [E87.5] Acute renal failure, unspecified acute renal failure type (HCC) [N17.9]   Principal Problem:   Acute metabolic encephalopathy Active Problems:   Goals of care, counseling/discussion   Hyperkalemia   Acute kidney injury superimposed on CKD [facility])   Palliative care encounter   Encounter for hospice care discussion   Hypothyroidism   AF (atrial fibrillation) (HCC)   Hearing loss   Chronic diastolic CHF (congestive heart failure) (HCC)   Acute renal failure (HCC)      Past Medical History:  Diagnosis Date  . Achalasia of cardia   . Acute respiratory failure (HCC)   . AF (atrial fibrillation) (HCC)   . Arthritis   . CHF (congestive heart failure) (HCC)   . Chronic venous hypertension   . Diaphragmatic hernia without mention of obstruction or gangrene   . Esophagitis   . First degree atrioventricular block   . Gastric polyp   . GERD (gastroesophageal reflux disease)   . Heme positive stool   . Hyperlipidemia   . Hyperlipidemia   . Hypertension   . Hypothyroid   . Pericardial effusion   .  Prolapse of vaginal walls without mention of uterine prolapse   . Rectal polyp   . Vitamin D deficiency     Past Surgical History:  Procedure Laterality Date  . DILATION AND CURETTAGE OF UTERUS    . KIDNEY STONE SURGERY       HPI  from the history and physical done on the day of admission:    Chief Complaint: Altered mental status.  HPI: [name] is a 86 y.o. female with medical history significant for atrial fibrillation, hypertension, congestive heart failure. Patient was brought to the ED today from nursing home reports of altered mental status, staff at nursing reported that patient had decreased level of consciousness today, usually alert and oriented.  Blood work done today at this nursing home showed potassium of 7.3, and creatinine of 5.78.  Recent hospitalization 2/16-06/02/19-for acute hypoxic respiratory failure secondary to congestive heart failure with preserved ejection fraction, improved with IV diuresis. Patient was discharged home on her previous dose of Lasix 40 mg daily, with some potassium supplementation.  ED Course: Heart rate 66, temperature 97.9.  O2 sats 95% on nasal cannula 2 L.  Blood pressure 115/74, my evaluation systolic was in  the 80s.  Creatinine markedly elevated at 6.12.  Potassium 7.2.  Sodium 128.  EKG showing atrial fibrillation, QRS 160.  QTc 498. D50 and 5 units of NovoLog given. EDP talked to nephrologist on-call Dr. Allena Katz, recommended giving [facility] every 4 hourly x3 doses and recheck potassium at midnight.  Patient was seen in the morning.  Hospitalist to admit for further evaluation and management.  Review of Systems: Unable to ascertain due to altered mental status.   Hospital Course:   Brief Summary:- 86 y.o.femalewith medical history significant foratrial fibrillation, HTN,  dCHF  Admitted on 06/09/2019 with acute metabolic encephalopathy in the setting of renal failure with presumed uremia/azotemia and UTI -Transfer to [facility] SNF  for rehab   A/p 1)Social/Ethics-extensive conversation with the patient's son about goals of care, patient remaines hemodynamically unstable with low BP, patient remains encephalopathic -Patient's renal function remains poor with recurrent episodes of hyperkalemia -Patient son states that patient would not want aggressive interventions -Specifically no hemodialysis, no pressors, -Patient son requested that we do not escalate care at this time, patient son requests that we maintain status quo for now.  He wants to see how his mom does over the next several days to weeks with rehab but [facility] -Patient remains a DNR/DNI -Palliative care consult and input appreciated -He may consider hospice involvement --ABG with mostly "compensated" Hypercarpnia  2)Aki on CKD IIIb with Persistent Hyperkalemia--- potassium down to 5.7 from a peak of 7.2 after hyperkalemia cocktail and Lokelma x3 doses -Okay to use Kayexalate 15 g every Friday --High mortality risk due to persistent hyperkalemia -Baseline creatinine 1.2 just over a week PTA -Creatinine up to 6.48 -Discussed with Nephrology service patient appears somewhat volume overloaded, advised no further IV fluids -Renal ultrasound without obstructive uropathy -Patient son declines HD  -Lisinopril discontinued  3)Acute Metabolic Encephalopathy--more awake, more interactive, overall still lethargic for long periods of time suspect uremia/azotemia related, gram-negative rod UTI is probably contributory as well ABG with mostly compensated hypercapnia, CT head without acute findings  4) E. coli and Klebsiella UTI--- treated with Unasyn (started 06/12/19) per sensitivity report, stopped  Aztreonam --patient with allergies to sulfa, cephalosporins and doxycycline Discharge on Augmentin on 06/16/2019  HTN--- stopped Lisinopril due to hypotension and hyperkalemia  4)Large Pericardial Effusion--this is probably contributing to patient's  hypotension  5)Permanent Atrial Fibrillation--- Eliquis on hold, patient is actually bradycardic without any AV nodal blocking agents  6)HFpEF/chronic diastolic dysfunction CHF----Lasix on hold due to low blood pressure and kidney concerns.  -Clinically somewhat volume overloaded but BP too low to allow for diuresis  7)Chronic anemia--- suspect some degree of anemia of CKD, hemoglobin stable above 10  8)Acute Hypoxic Resp failure--currently requiring oxygen at 2 L/min continuously via nasal cannula -Etiology of hypoxia includes congestive heart failure, pericardial effusion and renal failure with fluid overload and uremia  Disposition---transfer to [facility] SNF with palliative care following on 06/16/2019 ---further conversations with palliative care team  Code Status : Full code  Family Communication:  Discussed with son several times  - Antimicrobials this admission: aztreonam 3/10>>3/12 Unasyn 3/11>> -Discharge on Augmentin  Microbiology/culture data --3/10 UCx: E. coli, K. pneumoniae-->pan sensitive 3/2 Resp PCR: SARS CoV-2 negative; Flu A/B negative 3/10 MRSA PCR: negative  Consults  : Cardiology, palliative care and nephrology  Discharge Condition: Overall prognosis is poor  Follow UP--palliative care team   diet and Activity recommendation:  As advised  Discharge Instructions    Discharge Instructions    Amb Referral to Palliative  Care   Complete by: As directed    Call MD for:  difficulty breathing, headache or visual disturbances   Complete by: As directed    Call MD for:  extreme fatigue   Complete by: As directed    Call MD for:  persistant dizziness or light-headedness   Complete by: As directed    Call MD for:  persistant nausea and vomiting   Complete by: As directed    Call MD for:  severe uncontrolled pain   Complete by: As directed    Call MD for:  temperature >100.4   Complete by: As directed    Diet - low sodium heart healthy    Complete by: As directed    Discharge instructions   Complete by: As directed    --Avoid ibuprofen/Advil/Aleve/Motrin/Goody Powders/Naproxen/BC powders/Meloxicam/Diclofenac/Indomethacin and other Nonsteroidal anti-inflammatory medications as these will make you more likely to bleed and can cause stomach ulcers, can also cause Kidney problems.   ---  Palliative care consult please  -- hospice of [facility] to follow post discharge from SNF rehab   Increase activity slowly   Complete by: As directed       Discharge Medications     Allergies as of 06/16/2019      Reactions   Aspirin Nausea Only   Codeine Nausea Only   Sulfa Antibiotics    Vicodin [hydrocodone-acetaminophen] Nausea Only   Warfarin Sodium Other (See Comments)   Tingling all over   Cephalexin Rash   Red, rash covering lower limbs. Tolerated Unasyn   Doxycycline Rash      Medication List    STOP taking these medications   Eliquis 5 MG Tabs tablet Generic drug: apixaban   furosemide 40 MG tablet Commonly known as: LASIX   gabapentin 100 MG capsule Commonly known as: NEURONTIN   Klor-Con 10 10 MEQ tablet Generic drug: potassium chloride   lisinopril 40 MG tablet Commonly known as: ZESTRIL   omeprazole 20 MG capsule Commonly known as: PRILOSEC   polyethylene glycol powder 17 GM/SCOOP powder Commonly known as: GLYCOLAX/MIRALAX     TAKE these medications   albuterol 108 (90 Base) MCG/ACT inhaler Commonly known as: VENTOLIN HFA Inhale 2 puffs into the lungs every 6 (six) hours as needed for wheezing or shortness of breath.   amoxicillin-clavulanate 875-125 MG tablet Commonly known as: Augmentin Take 1 tablet by mouth daily for 5 days.   calcium-vitamin D 500-200 MG-UNIT tablet Commonly known as: OSCAL WITH D Take 1 tablet by mouth daily.   Centrum Silver tablet Take 1 tablet by mouth daily.   feeding supplement (ENSURE ENLIVE) Liqd Take 237 mLs by mouth 2 (two) times daily between  meals.   feeding supplement (PRO-STAT SUGAR FREE 64) Liqd Take 30 mLs by mouth 2 (two) times daily.   iron polysaccharides 150 MG capsule Commonly known as: Ferrex 150 Take 1 capsule (150 mg total) by mouth daily.   levothyroxine 137 MCG tablet Commonly known as: SYNTHROID Take 1 tablet (137 mcg total) by mouth daily. as directed   ondansetron 4 MG disintegrating tablet Commonly known as: Zofran ODT Take 1 tablet (4 mg total) by mouth every 8 (eight) hours as needed for nausea or vomiting.   ranitidine 300 MG tablet Commonly known as: ZANTAC Take 1 tablet (300 mg total) by mouth at bedtime.   senna-docusate 8.6-50 MG tablet Commonly known as: Senokot-S Take 2 tablets by mouth at bedtime.   sodium polystyrene 15 GM/60ML suspension Commonly known as: KAYEXALATE Take  60 mLs (15 g total) by mouth every Friday. Start taking on: June 19, 2019   Vitamin D3 50 MCG (2000 UT) Tabs Take 1 tablet by mouth daily.      Major procedures and Radiology Reports - PLEASE review detailed and final reports for all details, in brief -   CT HEAD WO CONTRAST  Result Date: 06/09/2019 CLINICAL DATA:  86 year old female with altered mental status common cephalopathy. EXAM: CT HEAD WITHOUT CONTRAST TECHNIQUE: Contiguous axial images were obtained from the base of the skull through the vertex without intravenous contrast. COMPARISON:  Brain MRI 02/25/2018. Head CT 05/24/2019. FINDINGS: Brain: Perivascular space at the inferior left basal ganglia again noted (normal variant). Cerebral volume is within normal limits for age. No midline shift, ventriculomegaly, mass effect, evidence of mass lesion, intracranial hemorrhage or evidence of cortically based acute infarction. Gray-white matter differentiation appears stable and within normal limits for age. Vascular: Calcified atherosclerosis at the skull base. No suspicious intracranial vascular hyperdensity. Skull: No acute osseous abnormality identified.  Sinuses/Orbits: Visualized paranasal sinuses and mastoids are stable and well pneumatized. Other: No acute orbit or scalp soft tissue finding. IMPRESSION: Non contrast CT appearance of the brain remains stable since last month and within normal limits for age. Electronically Signed   By: Odessa Fleming M.D.   On: 06/09/2019 22:41   CT HEAD WO CONTRAST  Result Date: 05/24/2019 CLINICAL DATA:  86 year old female with altered mental status. Admitted with respiratory failure. EXAM: CT HEAD WITHOUT CONTRAST TECHNIQUE: Contiguous axial images were obtained from the base of the skull through the vertex without intravenous contrast. COMPARISON:  Brain MRI 02/25/2018. FINDINGS: Brain: Cerebral volume is stable and within normal limits for age. There is a chronic perivascular space at the inferior left lentiform (normal variant). No midline shift, ventriculomegaly, mass effect, evidence of mass lesion, intracranial hemorrhage or evidence of cortically based acute infarction. Normal for age gray-white matter differentiation. Vascular: Calcified atherosclerosis at the skull base. Skull: No acute osseous abnormality identified. Sinuses/Orbits: Visualized paranasal sinuses and mastoids are stable and well pneumatized. Other: No acute orbit or scalp soft tissue finding. Scalp vessels are prominent diffusely, although unchanged from the prior MRI. IMPRESSION: Normal for age non contrast CT appearance of the brain. Electronically Signed   By: Odessa Fleming M.D.   On: 05/24/2019 18:19   US RENAL  Result Date: 06/10/2019 CLINICAL DATA:  Acute renal injury EXAM: RENAL / URINARY TRACT ULTRASOUND COMPLETE COMPARISON:  04/10/2017 FINDINGS: Right Kidney: Renal measurements: 12.5 x 4.4 x 5.0 cm. = volume: 145 mL. No mass lesion or hydronephrosis is noted. Mild increased echogenicity is seen. Left Kidney: Renal measurements: 12.2 x 5.7 x 6.0 cm = volume: 217 mL. Mild increased echogenicity is noted. Scattered cysts are noted throughout the left  kidney. The largest of these measures 2.3 cm and is simple in nature. The cysts are all clustered in the midportion of the kidney. No obstructive changes are seen. Bladder: Decompressed Other: None. IMPRESSION: Increased echogenicity consistent with medical renal disease. Left renal cysts. Electronically Signed   By: Alcide Clever M.D.   On: 06/10/2019 15:20   DG CHEST PORT 1 VIEW  Result Date: 05/27/2019 CLINICAL DATA:  Bilateral pleural effusion and pericardial effusion, negative COVID-19 testing 8 days prior EXAM: PORTABLE CHEST 1 VIEW COMPARISON:  Radiograph 05/24/2019 FINDINGS: Diminishing lung volumes with persistent bilateral interstitial and airspace opacities, slightly increasing in the right upper and lower lung though this may be attributable to the lower volumes. Stable  cardiomegaly. Bilateral effusions. No pneumothorax. No acute osseous or soft tissue abnormality. Telemetry leads overlie the chest. IMPRESSION: Diminishing lung volumes with persistent bilateral interstitial and airspace opacities, slightly increasing in the right upper and lower lung though this may be attributable to lower volumes. Bilateral effusions. Electronically Signed   By: Kreg ShropshirePrice  DeHay M.D.   On: 05/27/2019 05:58   DG CHEST PORT 1 VIEW  Result Date: 05/24/2019 CLINICAL DATA:  Acute on chronic respiratory failure. EXAM: PORTABLE CHEST 1 VIEW COMPARISON:  05/19/2019 FINDINGS: Patient is rotated to the right. There is mild motion artifact. Lungs are adequately inflated with interval worsening bibasilar opacification likely moderate bilateral effusions with associated bibasilar atelectasis. Infection in the mid to lower lungs is possible. Stable cardiomegaly with mild prominence of the perihilar markings suggesting mild vascular congestion. Remainder the exam is unchanged. IMPRESSION: Mild cardiomegaly with mild vascular congestion. Worsening bilateral moderate pleural effusions likely with associated bibasilar atelectasis.  Infection in the lung bases is possible. Electronically Signed   By: Elberta Fortisaniel  Boyle M.D.   On: 05/24/2019 10:40   DG Chest Port 1 View  Result Date: 05/19/2019 CLINICAL DATA:  Decreased oxygen saturation today. Lower extremity swelling. EXAM: PORTABLE CHEST 1 VIEW COMPARISON:  PA and lateral chest 05/28/2017. FINDINGS: There is massive cardiomegaly. Small to moderate bilateral pleural effusions are seen, larger on the right. Pulmonary edema and basilar atelectasis are seen. No pneumothorax. No acute bony abnormality. Avascular necrosis of the humeral heads is noted. IMPRESSION: Cardiomegaly with interstitial pulmonary edema and small to moderate pleural effusions, larger on the right. Electronically Signed   By: Drusilla Kannerhomas  Dalessio M.D.   On: 05/19/2019 12:25   ECHOCARDIOGRAM COMPLETE  Result Date: 05/20/2019    ECHOCARDIOGRAM REPORT   Patient Name:   [name] CzechBESSIE F Nold Date of Exam: 05/20/2019 Medical Rec #:  [id number]      Height:       64.0 in Accession #:    [id number]     Weight:       208.8 lb Date of Birth:  [birth year]      BSA:          1.99 m Patient Age:    86 years       BP:           100/52 mmHg Patient Gender: F              HR:           73 bpm. [facility] Procedure: 2D Echo Indications:    CHF-Acute Diastolic 428.31 / I50.31  History:        Patient has prior history of Echocardiogram examinations, most                 recent 03/16/2015. Arrythmias:Atrial Fibrillation; Risk                 Factors:Dyslipidemia, Hypertension and Non-Smoker. Acute                 hypoxemic respiratory failure , First degree atrioventricular                 block, GERD, Non-rheumatic mitral regurgitation.  Sonographer:    Jeryl ColumbiaJohanna Elliott RDCS (AE) Referring Phys: [phone] Lamont DowdyRATIK D [facility] IMPRESSIONS  1. Left ventricular ejection fraction, by estimation, is 60 to 65%. The left ventricle has normal function. The left ventricle has no regional wall motion abnormalities. There is mild concentric left ventricular  hypertrophy. Left ventricular diastolic parameters  are indeterminate. Elevated left ventricular end-diastolic pressure.  2. Right ventricular systolic function is normal. The right ventricular size is mildly enlarged. There is severely elevated pulmonary artery systolic pressure.  3. Left atrial size was severely dilated.  4. Right atrial size was severely dilated.  5. IVC collapses. No RA or RV collapse. No signs of tamponade. Large pericardial effusion. The pericardial effusion is circumferential.  6. The mitral valve is grossly normal. Mild mitral valve regurgitation.  7. Tricuspid valve regurgitation is moderate.  8. The aortic valve is tricuspid. Aortic valve regurgitation is mild. Mild aortic valve stenosis. Aortic valve area, by VTI measures 1.57 cm. Aortic valve mean gradient measures 17.0 mmHg. Aortic valve Vmax measures 2.79 m/s.  9. The inferior vena cava is dilated in size with >50% respiratory variability, suggesting right atrial pressure of 8 mmHg. FINDINGS  Left Ventricle: Left ventricular ejection fraction, by estimation, is 60 to 65%. The left ventricle has normal function. The left ventricle has no regional wall motion abnormalities. The left ventricular internal cavity size was normal in size. There is  mild concentric left ventricular hypertrophy. Left ventricular diastolic parameters are indeterminate. Elevated left ventricular end-diastolic pressure. Right Ventricle: The right ventricular size is mildly enlarged. No increase in right ventricular wall thickness. Right ventricular systolic function is normal. There is severely elevated pulmonary artery systolic pressure. The tricuspid regurgitant velocity is 4.02 m/s, and with an assumed right atrial pressure of 10 mmHg, the estimated right ventricular systolic pressure is 74.6 mmHg. Left Atrium: Left atrial size was severely dilated. Right Atrium: Right atrial size was severely dilated. Pericardium: IVC collapses. No RA or RV collapse. No signs  of tamponade. A large pericardial effusion is present. The pericardial effusion is circumferential. Mitral Valve: The mitral valve is grossly normal. Mild mitral valve regurgitation. Tricuspid Valve: The tricuspid valve is grossly normal. Tricuspid valve regurgitation is moderate. Aortic Valve: The aortic valve is tricuspid. Aortic valve regurgitation is mild. Aortic regurgitation PHT measures 540 msec. Mild aortic stenosis is present. Moderate aortic valve annular calcification. There is mild calcification of the aortic valve. Aortic valve mean gradient measures 17.0 mmHg. Aortic valve peak gradient measures 31.1 mmHg. Aortic valve area, by VTI measures 1.57 cm. Pulmonic Valve: The pulmonic valve was grossly normal. Pulmonic valve regurgitation is not visualized. Aorta: The aortic root is normal in size and structure. Venous: The inferior vena cava is dilated in size with greater than 50% respiratory variability, suggesting right atrial pressure of 8 mmHg. IAS/Shunts: No atrial level shunt detected by color flow Doppler.  LEFT VENTRICLE PLAX 2D LVIDd:         5.12 cm  Diastology LVIDs:         3.07 cm  LV e' lateral:   8.81 cm/s LV PW:         1.34 cm  LV E/e' lateral: 15.3 LV IVS:        1.15 cm  LV e' medial:    6.53 cm/s LVOT diam:     1.80 cm  LV E/e' medial:  20.7 LV SV:         91.86 ml LV SV Index:   41.66 LVOT Area:     2.54 cm  LEFT ATRIUM              Index       RIGHT ATRIUM           Index LA diam:        4.90 cm  2.46  cm/m  RA Area:     38.50 cm LA Vol (A2C):   186.0 ml 93.35 ml/m RA Volume:   166.00 ml 83.32 ml/m LA Vol (A4C):   160.0 ml 80.30 ml/m LA Biplane Vol: 173.0 ml 86.83 ml/m  AORTIC VALVE AV Area (Vmax):    1.62 cm AV Area (Vmean):   1.49 cm AV Area (VTI):     1.57 cm AV Vmax:           278.67 cm/s AV Vmean:          192.333 cm/s AV VTI:            0.584 m AV Peak Grad:      31.1 mmHg AV Mean Grad:      17.0 mmHg LVOT Vmax:         177.33 cm/s LVOT Vmean:        112.667 cm/s LVOT  VTI:          0.361 m LVOT/AV VTI ratio: 0.62 AI PHT:            540 msec  AORTA Ao Root diam: 2.80 cm MITRAL VALVE                TRICUSPID VALVE MV Area (PHT): 2.42 cm     TR Peak grad:   64.6 mmHg MV Decel Time: 313 msec     TR Vmax:        402.00 cm/s MR Peak grad: 63.0 mmHg MR Mean grad: 45.0 mmHg     SHUNTS MR Vmax:      397.00 cm/s   Systemic VTI:  0.36 m MR Vmean:     320.0 cm/s    Systemic Diam: 1.80 cm MV E velocity: 135.00 cm/s MV A velocity: 39.40 cm/s MV E/A ratio:  3.43 Kate Sable MD Electronically signed by Kate Sable MD Signature Date/Time: 05/20/2019/2:55:41 PM    Final    ECHOCARDIOGRAM LIMITED  Result Date: 05/23/2019    ECHOCARDIOGRAM LIMITED REPORT   Patient Name:   [name] Date of Exam: 05/23/2019 Medical Rec #:  [id number]      Height:       64.0 in Accession #:    [id number]     Weight:       195.1 lb Date of Birth:  06/18/1932      BSA:          1.936 m Patient Age:    89 years       BP:           129/70 mmHg Patient Gender: F              HR:           78 bpm. [facility] Procedure: Limited Echo and Cardiac Doppler Indications:    Pericardial Effusion 423.9 / l31.3  History:        Patient has prior history of Echocardiogram examinations, most                 recent 05/20/2019. CHF, Arrythmias:Atrial Fibrillation; Risk                 Factors:Hypertension and Dyslipidemia. Non-Smoker. Acute                 hypoxemic respiratory failure , First degree atrioventricular                 block, GERD, Non-rheumatic mitral regurgitation.  Sonographer:  Celesta Gentile RCS Referring Phys: [phone] Cleora Fleet IMPRESSIONS  1. Limited study.  2. Large pericardial effusion, circumferential but to greater degree posterior and lateral. Dimensions are similar to previous study on February 17. No obvious significant respiratory variation in mitral outflow to suggest tamponade physiology. There is  no compression of RA or RV either, but evidence of significant  pulmonary hypertension (last study RVSP was approximately 75 mmHg) and this could be inhibiting compression of RV chamber. Organizing material adherent to pericardium suggests possible chronicity, particularly given effusion size.  3. Left ventricular ejection fraction, by estimation, is 60 to 65%. The left ventricle has normal function. The left ventricle has no regional wall motion abnormalities. There is the interventricular septum is flattened in systole and diastole, consistent with right ventricular pressure and volume overload.  4. The right ventricular size is moderately enlarged.  5. Left atrial size was severely dilated.  6. Right atrial size was severely dilated.  7. The mitral valve is grossly normal. FINDINGS  Left Ventricle: Left ventricular ejection fraction, by estimation, is 60 to 65%. The left ventricle has normal function. The left ventricle has no regional wall motion abnormalities. The interventricular septum is flattened in systole and diastole, consistent with right ventricular pressure and volume overload. Right Ventricle: The right ventricular size is moderately enlarged. Left Atrium: Left atrial size was severely dilated. Right Atrium: Right atrial size was severely dilated. Pericardium: Dimensions are similar to previous study on February 17. No obvious significant respiratory variation in mitral outflow to suggest tamponade physiology. There is no compression of RA or RV either, but evidence of significant pulmonary hypertension (last study RVSP was approximately 75 mmHg) and this could be inhibiting compression of RV chamber. Organizing material adherent to pericardium suggests possible chronicity, particularly given effusion size. A large pericardial effusion is present. The pericardial effusion is circumferential. Mitral Valve: The mitral valve is grossly normal. Mild to moderate mitral annular calcification. Additional Comments: There is pleural effusion in the left lateral region.  Nona Dell MD Electronically signed by Nona Dell MD Signature Date/Time: 05/23/2019/2:09:38 PM    Final    DG ESOPHAGUS W SINGLE CM (SOL OR THIN BA)  Result Date: 05/22/2019 CLINICAL DATA:  DYSPHAGIA. EXAM: ESOPHOGRAM/BARIUM SWALLOW TECHNIQUE: Single contrast examination was performed using thin barium or water soluble. FLUOROSCOPY TIME:  Fluoroscopy Time: 2 minutes 18 seconds Radiation Exposure Index (if provided by the fluoroscopic device): 38.6 mGy COMPARISON:  None. FINDINGS: The patient ingested thin barium in a semi upright position. The patient has poor esophageal peristalsis with persistent spasm in the distal esophagus consistent with achalasia. I placed the patient in an almost upright position and only a small amount of barium trickled into the nondistended stomach. IMPRESSION: Achalasia. Minimal emptying of the dilated esophagus. Minimal peristalsis in the esophagus. The patient tolerated the procedure well. No immediate complications. Electronically Signed   By: Francene Boyers M.D.   On: 05/22/2019 11:18    Micro Results    Recent Results (from the past 240 hour(s))  MRSA PCR Screening     Status: None   Collection Time: 06/10/19  5:03 AM   Specimen: Nasal Mucosa; Nasopharyngeal  Result Value Ref Range Status   MRSA by PCR NEGATIVE NEGATIVE Final    Comment:        The GeneXpert MRSA Assay (FDA approved for NASAL specimens only), is one component of a comprehensive MRSA colonization surveillance program. It is not intended to diagnose MRSA infection nor to guide or  monitor treatment for MRSA infections. Performed at [facility], [address] [place], [place] [address]   Culture, Urine     Status: Abnormal   Collection Time: 06/10/19  5:18 AM   Specimen: Urine, Clean Catch  Result Value Ref Range Status   Specimen Description URINE, CLEAN CATCH  Final   Special Requests   Final    NONE Performed at [facility], [address] [place], [place] [address]     Culture (A)  Final    >=100,000 COLONIES/mL ESCHERICHIA COLI >=100,000 COLONIES/mL KLEBSIELLA PNEUMONIAE    Report Status 06/12/2019 FINAL  Final   Organism ID, Bacteria ESCHERICHIA COLI (A)  Final   Organism ID, Bacteria KLEBSIELLA PNEUMONIAE (A)  Final      Susceptibility   Escherichia coli - MIC*    AMPICILLIN 4 SENSITIVE Sensitive     CEFAZOLIN <=4 SENSITIVE Sensitive     CEFTRIAXONE <=0.25 SENSITIVE Sensitive     CIPROFLOXACIN <=0.25 SENSITIVE Sensitive     GENTAMICIN <=1 SENSITIVE Sensitive     IMIPENEM <=0.25 SENSITIVE Sensitive     NITROFURANTOIN <=16 SENSITIVE Sensitive     TRIMETH/SULFA <=20 SENSITIVE Sensitive     AMPICILLIN/SULBACTAM <=2 SENSITIVE Sensitive     PIP/TAZO <=4 SENSITIVE Sensitive     * >=100,000 COLONIES/mL ESCHERICHIA COLI   Klebsiella pneumoniae - MIC*    AMPICILLIN >=32 RESISTANT Resistant     CEFAZOLIN <=4 SENSITIVE Sensitive     CEFTRIAXONE <=0.25 SENSITIVE Sensitive     CIPROFLOXACIN <=0.25 SENSITIVE Sensitive     GENTAMICIN <=1 SENSITIVE Sensitive     IMIPENEM <=0.25 SENSITIVE Sensitive     NITROFURANTOIN 32 SENSITIVE Sensitive     TRIMETH/SULFA <=20 SENSITIVE Sensitive     AMPICILLIN/SULBACTAM 8 SENSITIVE Sensitive     PIP/TAZO <=4 SENSITIVE Sensitive     * >=100,000 COLONIES/mL KLEBSIELLA PNEUMONIAE  SARS CORONAVIRUS 2 (TAT 6-24 HRS) Nasopharyngeal Nasopharyngeal Swab     Status: None   Collection Time: 06/14/19  6:45 PM   Specimen: Nasopharyngeal Swab  Result Value Ref Range Status   SARS Coronavirus 2 NEGATIVE NEGATIVE Final    Comment: (NOTE) SARS-CoV-2 target nucleic acids are NOT DETECTED. The SARS-CoV-2 RNA is generally detectable in upper and lower respiratory specimens during the acute phase of infection. Negative results do not preclude SARS-CoV-2 infection, do not rule out co-infections with other pathogens, and should not be used as the sole basis for treatment or other patient management decisions. Negative results must be  combined with clinical observations, patient history, and epidemiological information. The expected result is Negative. Fact Sheet for Patients: HairSlick.no Fact Sheet for Healthcare Providers: quierodirigir.com This test is not yet approved or cleared by the [place] FDA and  has been authorized for detection and/or diagnosis of SARS-CoV-2 by FDA under an Emergency Use Authorization (EUA). This EUA will remain  in effect (meaning this test can be used) for the duration of the COVID-19 declaration under Section 56 4(b)(1) of the Act, 21 U.S.C. section 360bbb-3(b)(1), unless the authorization is terminated or revoked sooner. Performed at [facility], [address], [place], [place] [address]        Today   Subjective    Mone Kalisz today has no resting comfortably -Conference call with positive nurse practitioner  from palliative care myself and patient's son--- Ethelene Browns -Transferred -Poor prognosis discussed --Son okay with discharge to [facility] SNF rehab with further  Palliative care consult  - hospice of [facility] to follow post  discharge from SNF rehab         Patient has been seen and examined prior to discharge   Objective   Blood pressure (!) 101/52, pulse 65, temperature 97.6 F (36.4 C), temperature source Oral, resp. rate 20, height 5\' 2"  (1.575 m), weight 81.2 kg, SpO2 98 %.   Intake/Output Summary (Last 24 hours) at 06/16/2019 1609 Last data filed at 06/15/2019 1640 Gross per 24 hour  Intake --  Output 225 ml  Net -225 ml    Exam Gen:- Awake Alert, no acute distress, cooperative HEENT:- [place].AT, No sclera icterus Nose- [place] 2L/min Neck-Supple Neck,No JVD,.  Lungs-diminished in bases, no wheezing CV- S1, S2 normal, regular Abd-  +ve B.Sounds, Abd Soft, No tenderness,    Extremity/Skin:- No  edema,   good pulses, significant lower extremity varicosities Psych--episodes of  forgetfulness and disorientation from time to time  neuro-generalized weakness no new focal deficits, no tremors  GU--Foley catheter to be removed   Data Review   CBC w Diff:  Lab Results  Component Value Date   WBC 2.8 (L) 06/15/2019   HGB 10.4 (L) 06/15/2019   HGB 11.1 01/05/2019   HCT 34.4 (L) 06/15/2019   HCT 33.5 (L) 01/05/2019   PLT 84 (L) 06/15/2019   PLT 74 (LL) 01/05/2019   LYMPHOPCT 12 06/09/2019   MONOPCT 13 06/09/2019   EOSPCT 1 06/09/2019   BASOPCT 0 06/09/2019    CMP:  Lab Results  Component Value Date   NA 135 06/16/2019   NA 141 01/05/2019   K 5.7 (H) 06/16/2019   CL 89 (L) 06/16/2019   CO2 32 06/16/2019   BUN 140 (H) 06/16/2019   BUN 17 01/05/2019   CREATININE 6.48 (H) 06/16/2019   CREATININE 0.92 10/01/2012   PROT 5.7 (L) 06/11/2019   PROT 6.8 01/05/2019   ALBUMIN 2.6 (L) 06/14/2019   ALBUMIN 4.2 01/05/2019   BILITOT 1.3 (H) 06/11/2019   BILITOT 1.0 01/05/2019   ALKPHOS 111 06/11/2019   AST 22 06/11/2019   ALT 17 06/11/2019  .   Total Discharge time is about 33 minutes  Shon Hale M.D on 06/16/2019 at 4:09 PM  Go to [email or web address] -  for contact info  [facility] - Office  [phone]

## 2019-06-16 NOTE — Progress Notes (Signed)
Report called to [place], LPN at [facility]. Awaiting EMS for transport.

## 2019-06-16 NOTE — Care Management Important Message (Signed)
Important Message  Patient Details  Name: [name] MRN: [id number] Date of Birth: Apr 13, 1932   Medicare Important Message Given:  Yes     Corey Harold 06/16/2019, 3:40 PM

## 2019-06-16 NOTE — Plan of Care (Signed)

## 2019-06-16 NOTE — Progress Notes (Signed)
Physical Therapy Treatment Patient Details Name: [name] MRN: [id number] DOB: [birth year] Today's Date: 06/16/2019    History of Present Illness [name] is a 86 y.o. female with medical history significant for atrial fibrillation, hypertension, congestive heart failure.Patient was brought to the ED today from nursing home reports of altered mental status, staff at nursing reported that patient had decreased level of consciousness today, usually alert and oriented.  Blood work done today at this nursing home showed potassium of 7.3, and creatinine of 5.78.    PT Comments    Patient has difficulty with supine to sitting and scooting to bedside due to generalized weakness and c/o mild/moderate dizziness upon sitting up at bedside requiring 5-6 minutes before resolving.  Patient demonstrates good sitting balance while completing BLE exercises, limited to a few steps at bedside secondary to poor standing balance and generalized weakness.  Patient tolerated sitting up in chair after therapy - RN notified.  Patient will benefit from continued physical therapy in hospital and recommended venue below to increase strength, balance, endurance for safe ADLs and gait.   Follow Up Recommendations  SNF     Equipment Recommendations  None recommended by PT    Recommendations for Other Services       Precautions / Restrictions Precautions Precautions: Fall Restrictions Weight Bearing Restrictions: No    Mobility  Bed Mobility Overal bed mobility: Needs Assistance Bed Mobility: Supine to Sit     Supine to sit: Mod assist     General bed mobility comments: slow labored movement with diffiuclty scooting forward to EOB  Transfers Overall transfer level: Needs assistance Equipment used: Rolling walker (2 wheeled) Transfers: Sit to/from [facility] Sit to Stand: Min assist;Mod assist Stand pivot transfers: Mod assist       General transfer comment: slow labored  movement  Ambulation/Gait Ambulation/Gait assistance: Mod assist Gait Distance (Feet): 5 Feet Assistive device: Rolling walker (2 wheeled) Gait Pattern/deviations: Decreased step length - right;Decreased step length - left;Decreased stride length Gait velocity: slow   General Gait Details: limited to 5-6 slow labored side steps at bedside due to fatigue and generalized weakness   Stairs             Wheelchair Mobility    Modified Rankin (Stroke Patients Only)       Balance Overall balance assessment: Needs assistance Sitting-balance support: Feet supported;No upper extremity supported Sitting balance-Leahy Scale: Fair Sitting balance - Comments: seated EOB   Standing balance support: During functional activity;Bilateral upper extremity supported Standing balance-Leahy Scale: Poor Standing balance comment: fair/ poor using RW                            Cognition Arousal/Alertness: Awake/alert Behavior During Therapy: WFL for tasks assessed/performed Overall Cognitive Status: No family/caregiver present to determine baseline cognitive functioning                                        Exercises General Exercises - Lower Extremity Ankle Circles/Pumps: Seated;AROM;Strengthening;Both;10 reps Long Arc Quad: Seated;AROM;Strengthening;Both;10 reps Hip Flexion/Marching: Seated;AROM;Strengthening;Both;10 reps    General Comments        Pertinent Vitals/Pain Pain Assessment: No/denies pain    Home Living                      Prior Function  PT Goals (current goals can now be found in the care plan section) Acute Rehab PT Goals Patient Stated Goal: return home PT Goal Formulation: With patient Time For Goal Achievement: 06/29/19 Potential to Achieve Goals: Fair Progress towards PT goals: Progressing toward goals    Frequency    Min 3X/week      PT Plan Current plan remains appropriate     Co-evaluation              AM-PAC PT "6 Clicks" Mobility   Outcome Measure  Help needed turning from your back to your side while in a flat bed without using bedrails?: A Lot Help needed moving from lying on your back to sitting on the side of a flat bed without using bedrails?: A Lot Help needed moving to and from a bed to a chair (including a wheelchair)?: A Lot Help needed standing up from a chair using your arms (e.g., wheelchair or bedside chair)?: A Lot Help needed to walk in hospital room?: A Lot Help needed climbing 3-5 steps with a railing? : A Lot 6 Click Score: 12    End of Session Equipment Utilized During Treatment: Oxygen Activity Tolerance: Patient tolerated treatment well;Patient limited by fatigue Patient left: in chair;with call bell/phone within reach;with chair alarm set Nurse Communication: Mobility status PT Visit Diagnosis: Unsteadiness on feet (R26.81);Other abnormalities of gait and mobility (R26.89);Muscle weakness (generalized) (M62.81)     Time: 6659-9357 PT Time Calculation (min) (ACUTE ONLY): 27 min  Charges:  $Therapeutic Exercise: 8-22 mins $Therapeutic Activity: 8-22 mins                     2:28 PM, 06/16/19 Ocie Bob, MPT Physical Therapist with [facility] [phone] office [phone] mobile phone

## 2019-06-16 NOTE — Discharge Instructions (Signed)
--  Avoid ibuprofen/Advil/Aleve/Motrin/Goody Powders/Naproxen/BC powders/Meloxicam/Diclofenac/Indomethacin and other Nonsteroidal anti-inflammatory medications as these will make you more likely to bleed and can cause stomach ulcers, can also cause Kidney problems.   ---  Palliative care consult please   -- hospice of [facility] to follow post discharge from SNF rehab

## 2019-06-16 NOTE — NC FL2 (Signed)
[place] LEVEL OF CARE SCREENING TOOL     IDENTIFICATION  Patient Name: [name] Birthdate: 1932-06-04 Sex: female Admission Date (Current Location): 06/09/2019  [facility] [employer] and [place] Number:  [employer] and Address:  [place] [address] [place]      Provider Number: [id number]  Attending Physician Name and Address:  Roxan Hockey, MD  Relative Name and Phone Number:       Current Level of Care: Hospital Recommended Level of Care: [place] Prior Approval Number:    Date Approved/Denied:   PASRR Number:    Discharge Plan: SNF    Current Diagnoses: Patient Active Problem List   Diagnosis Date Noted  . Acute renal failure ([place])   . Encounter for hospice care discussion   . Palliative care encounter 06/10/2019  . Hyperkalemia 06/09/2019  . Chronic diastolic CHF (congestive heart failure) ([place]) 06/09/2019  . Acute metabolic encephalopathy 52/84/1324  . Acute kidney injury superimposed on CKD ([place]) 06/09/2019  . Pericardial effusion   . Palliative care by specialist   . Goals of care, counseling/discussion   . Bilateral pleural effusion   . Acute on chronic respiratory failure ([place])   . Dysphagia   . Achalasia   . Acute congestive heart failure ([place]) 05/20/2019  . Volume overload 05/20/2019  . Acute hypoxemic respiratory failure ([place]) 05/19/2019  . Venous stasis of both lower extremities 01/13/2019  . Generalized anxiety disorder 11/07/2018  . Non-rheumatic mitral regurgitation 03/20/2017  . Thrombocytopenia ([place]) 09/14/2016  . Hemoglobin decreased 09/14/2016  . Hearing loss 01/06/2015  . Major depressive disorder, recurrent episode, moderate ([place]) 06/07/2014  . Thoracic aorta atherosclerosis ([place]) 03/04/2014  . Degenerative arthritis of thoracic spine 03/04/2014  . Osteoporosis, post-menopausal 08/12/2013  . Metabolic syndrome 40/01/2724  . Anemia, iron deficiency 03/16/2013  .  Vitamin D deficiency 03/16/2013  . HTN (hypertension) 03/16/2013  . Hypothyroidism   . Diaphragmatic hernia without mention of obstruction or gangrene   . GERD (gastroesophageal reflux disease)   . Hyperlipidemia   . Prolapse of vaginal walls without mention of uterine prolapse   . Arthritis   . First degree atrioventricular block   . Symptomatic menopausal or female climacteric states   . AF (atrial fibrillation) ([place])   . Rectal polyp   . Gastric polyp     Orientation RESPIRATION BLADDER Height & Weight     Self, Time, Situation, Place  O2 Continent Weight: 81.2 kg Height:  5\' 2"  (157.5 cm)  BEHAVIORAL SYMPTOMS/MOOD NEUROLOGICAL BOWEL NUTRITION STATUS      Continent Diet(see DC summary)  AMBULATORY STATUS COMMUNICATION OF NEEDS Skin   Extensive Assist Verbally                         Personal Care Assistance Level of Assistance  Bathing, Feeding Bathing Assistance: Limited assistance   Dressing Assistance: Limited assistance     Functional Limitations Info  Sight, Hearing Sight Info: Adequate Hearing Info: Impaired      SPECIAL CARE FACTORS FREQUENCY                       Contractures      Additional Factors Info      Allergies Info: Aspirin, Codeine, Sulfa Antibiotics, Vicodin; Warfarin, Cephalexin, Doxycycline           Current Medications (06/16/2019):  This is the current hospital active medication list Current Facility-Administered Medications  Medication Dose Route Frequency Provider Last Rate Last Admin  . Ampicillin-Sulbactam (UNASYN) 3 g in sodium chloride 0.9 % 100 mL IVPB  3 g Intravenous Q24H Emokpae, Courage, MD 200 mL/hr at 06/16/19 0917 3 g at 06/16/19 0917  . calcium carbonate (TUMS - dosed in mg elemental calcium) chewable tablet 200 mg of elemental calcium  1 tablet Oral TID PRN Lillia Carmel A, NP      . Chlorhexidine Gluconate Cloth 2 % PADS 6 each  6 each Topical Daily Shon Hale, MD   6 each at 06/16/19 0813  . heparin  injection 5,000 Units  5,000 Units Subcutaneous Q8H Emokpae, Ejiroghene E, MD   5,000 Units at 06/16/19 1435  . MEDLINE mouth rinse  15 mL Mouth Rinse BID Mariea Clonts, Courage, MD   15 mL at 06/16/19 0812  . ondansetron (ZOFRAN) injection 4 mg  4 mg Intravenous Q6H PRN Shon Hale, MD   4 mg at 06/15/19 2202  . promethazine (PHENERGAN) suppository 25 mg  25 mg Rectal Once Shon Hale, MD   Stopped at 06/13/19 1035     Discharge Medications: Please see discharge summary for a list of discharge medications.  Relevant Imaging Results:  Relevant Lab Results:   Additional Information SSN [id number]  Ramiro Pangilinan, Chrystine Oiler, RN

## 2019-06-17 ENCOUNTER — Telehealth: Payer: Self-pay | Admitting: *Deleted

## 2019-06-17 NOTE — Telephone Encounter (Signed)
   Transitional Care Note Hospital Discharge to Skilled Nursing Facility 06/17/2019 Reviewed By: Almira Coaster, LPN  [name] was admitted to [facility] on 06/09/19 with hyperkalemia and acute renal failure.  she was transferred from [facility] to [facility] (Skilled Nursing Facility) on 06/16/19.  she will be followed by the facility's staff physicians while there and will need to follow-up with Raliegh Ip, DO (PCP) within two weeks of discharge from SNF for Transitional Care Management.   Plan . Clinical staff will monitor discharge reports and contact patient to initiate TCM within 2 business days of SNF discharge.  . After discharge CCM team will collaborate with PCP regarding potential CCM eligibility and the benefits CCM services provide the patient. The primary goal would be to increase patient self-management of chronic medical conditions resulting in improved patient health outcomes, decreased exacerbations of chronic medical conditions, and decreased ED visits/hospital admissions.

## 2019-06-18 DIAGNOSIS — Z743 Need for continuous supervision: Secondary | ICD-10-CM | POA: Diagnosis not present

## 2019-06-18 DIAGNOSIS — R69 Illness, unspecified: Secondary | ICD-10-CM | POA: Diagnosis not present

## 2019-06-18 DIAGNOSIS — R5381 Other malaise: Secondary | ICD-10-CM | POA: Diagnosis not present

## 2019-06-18 DIAGNOSIS — Z7401 Bed confinement status: Secondary | ICD-10-CM | POA: Diagnosis not present

## 2019-07-01 DIAGNOSIS — R69 Illness, unspecified: Secondary | ICD-10-CM | POA: Diagnosis not present

## 2019-07-01 DIAGNOSIS — Z743 Need for continuous supervision: Secondary | ICD-10-CM | POA: Diagnosis not present

## 2019-08-01 DEATH — deceased

## 2020-09-24 IMAGING — CT CT HEAD W/O CM
3 series · 15 of 47 positions shown, 18 images · non-contrast
Comparison: Brain MRI 02/25/2018. Head CT 05/24/2019.

CLINICAL DATA: 87-year-old female with altered mental status common
cephalopathy.

EXAM:
CT HEAD WITHOUT CONTRAST
TECHNIQUE: Contiguous axial images were obtained from the base of the skull
through the vertex without intravenous contrast.

[Series 2: head w o · axial · 0.46mm/px · z∈[-22,+113]mm · 9 of 33 slices shown, 12 images]
[im 3/33  brain]
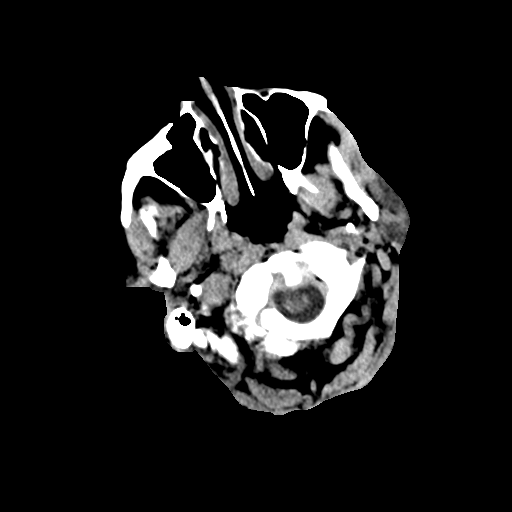
[im 3/33  bone]
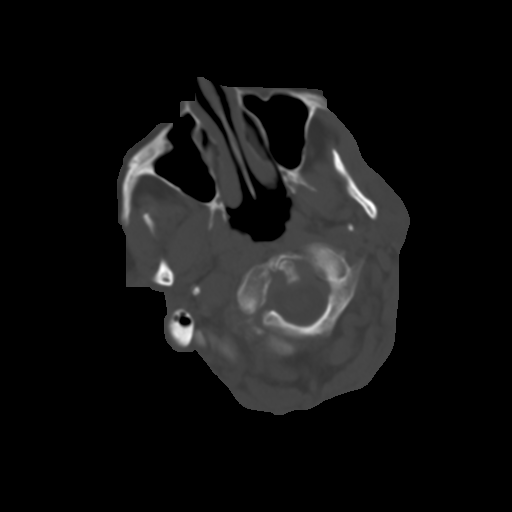
[im 6/33  brain]
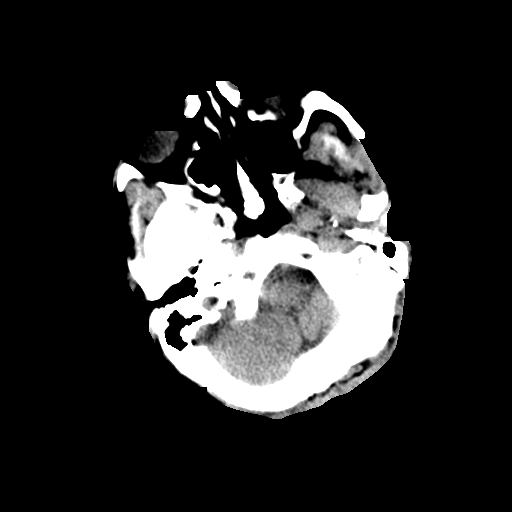
[im 9/33  brain]
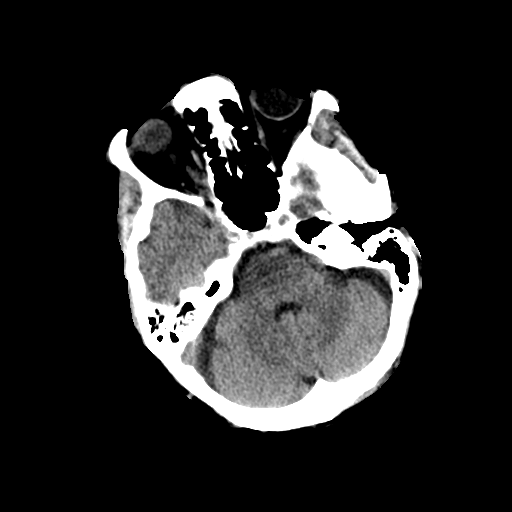
[im 13/33  brain]
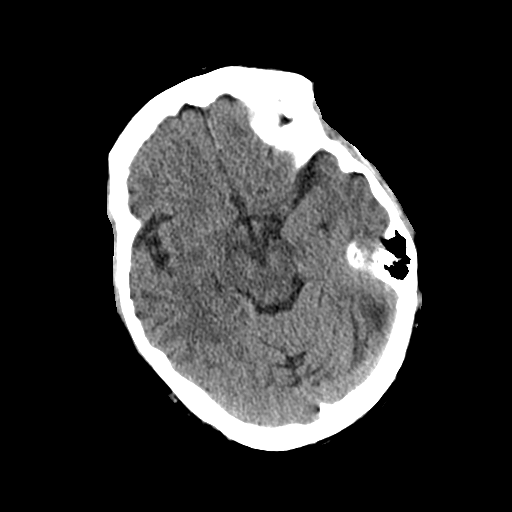
[im 17/33  brain]
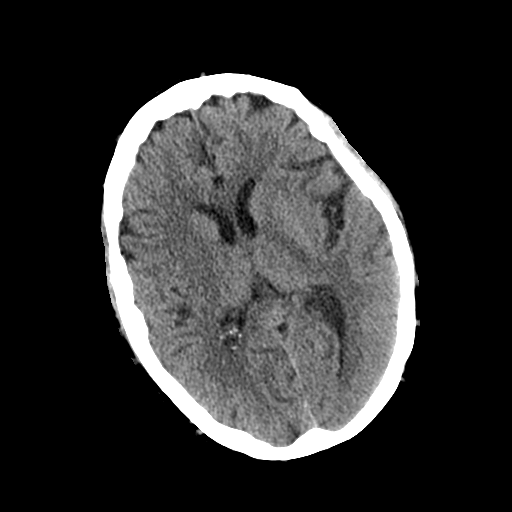
[im 17/33  bone]
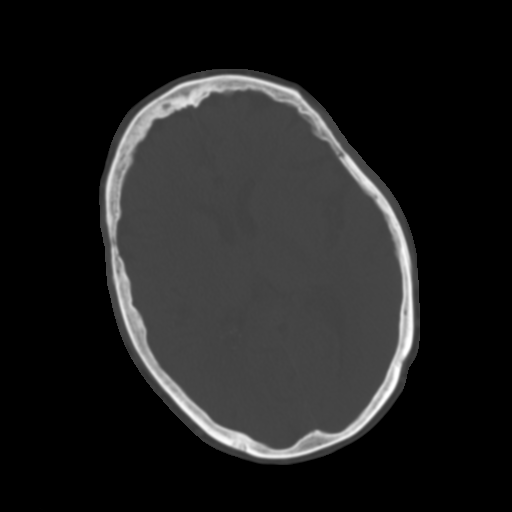
[im 20/33  brain]
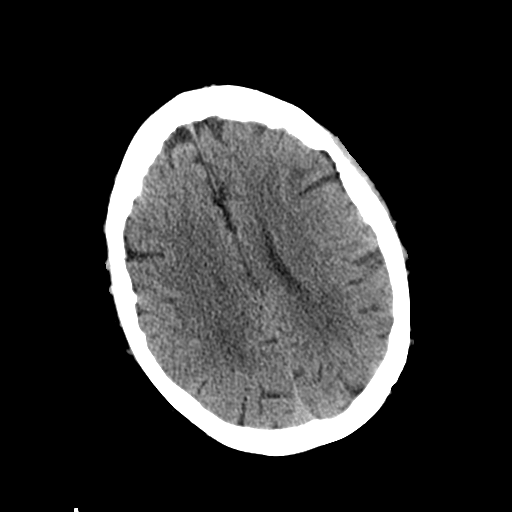
[im 24/33  brain]
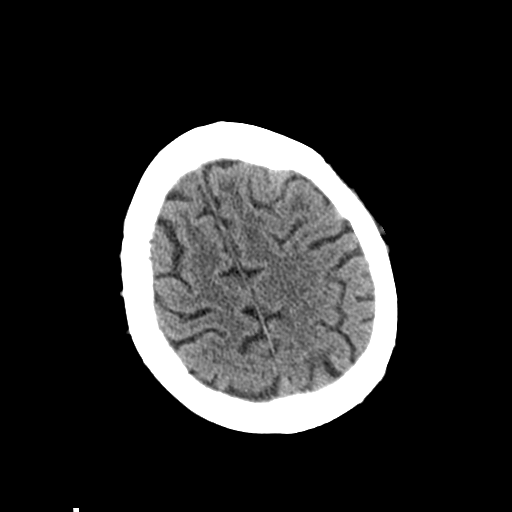
[im 27/33  brain]
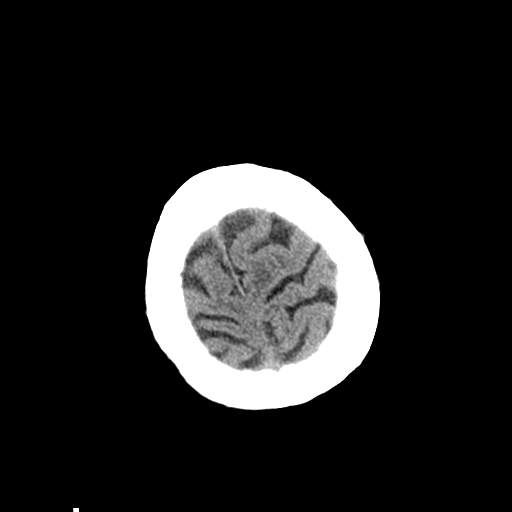
[im 30/33  brain]
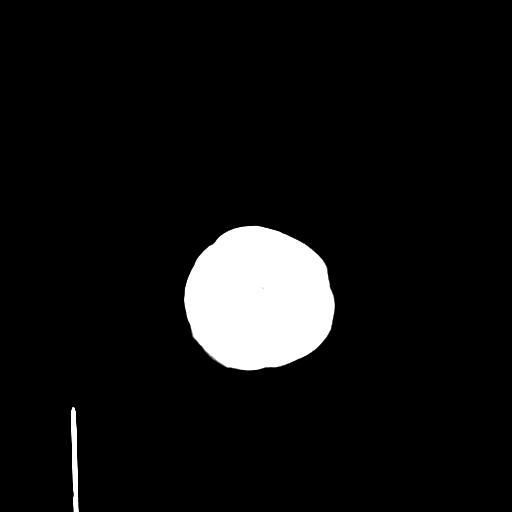
[im 30/33  bone]
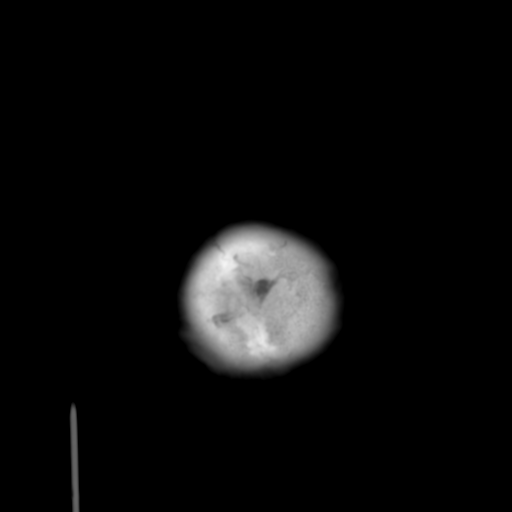

[Series 4: coronal soft · coronal · 0.32mm/px · 3 of 63 slices shown]
[im 21/63  brain]
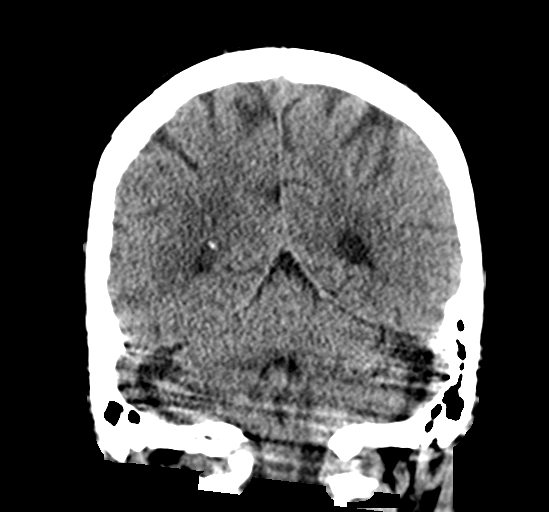
[im 28/63  brain]
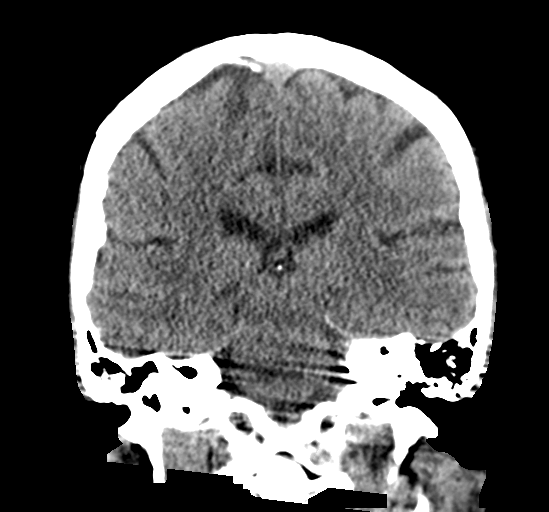
[im 35/63  brain]
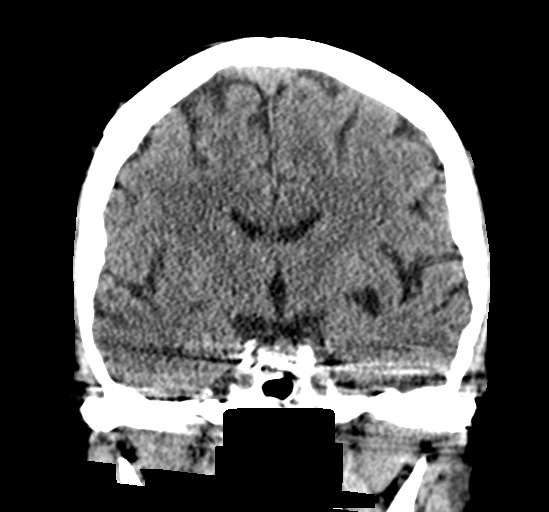

[Series 5: sagittal soft · sagittal · 0.33mm/px · 3 of 51 slices shown]
[im 19/51  brain]
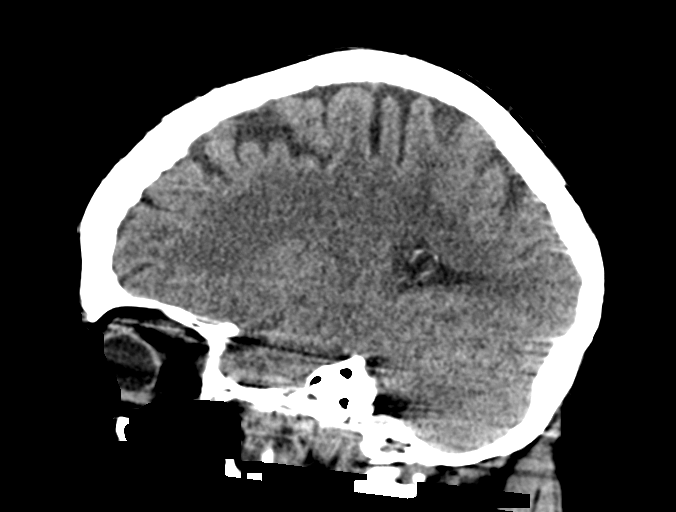
[im 26/51  brain]
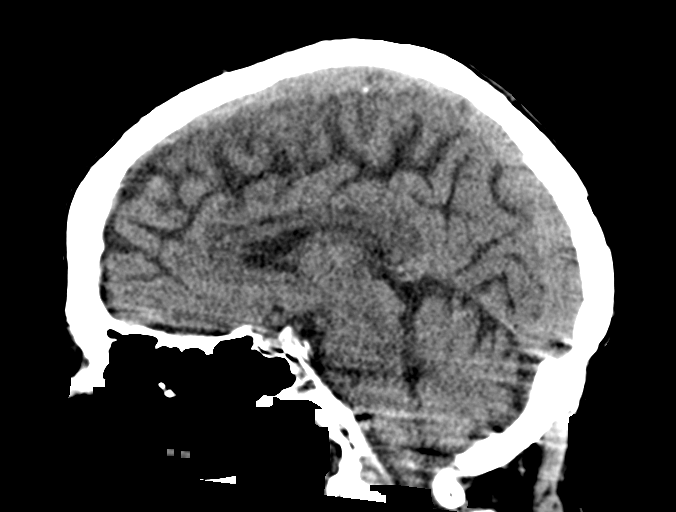
[im 33/51  brain]
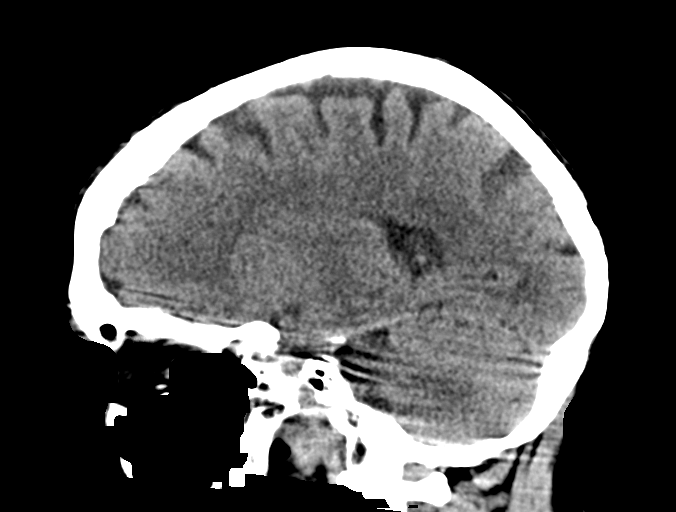

[15 of 47 positions shown; findings below may reference images not displayed]

FINDINGS: Brain: Perivascular space at the inferior left basal ganglia again
noted (normal variant). Cerebral volume is within normal limits for
age. No midline shift, ventriculomegaly, mass effect, evidence of
mass lesion, intracranial hemorrhage or evidence of cortically based
acute infarction. Gray-white matter differentiation appears stable
and within normal limits for age.

Vascular: Calcified atherosclerosis at the skull base. No suspicious
intracranial vascular hyperdensity.

Skull: No acute osseous abnormality identified.

Sinuses/Orbits: Visualized paranasal sinuses and mastoids are stable
and well pneumatized.

Other: No acute orbit or scalp soft tissue finding.
IMPRESSION: Non contrast CT appearance of the brain remains stable since last
month and within normal limits for age.
